# Patient Record
Sex: Male | Born: 1965 | Race: Black or African American | Hispanic: No | Marital: Single | Smoking: Never smoker
Health system: Southern US, Community
[De-identification: ages and names within clinical notes are randomized; demographics above are authoritative.]

## PROBLEM LIST (undated history)

## (undated) DIAGNOSIS — G43909 Migraine, unspecified, not intractable, without status migrainosus: Secondary | ICD-10-CM

## (undated) DIAGNOSIS — I10 Essential (primary) hypertension: Secondary | ICD-10-CM

## (undated) DIAGNOSIS — H409 Unspecified glaucoma: Secondary | ICD-10-CM

## (undated) DIAGNOSIS — F32A Depression, unspecified: Secondary | ICD-10-CM

## (undated) DIAGNOSIS — K219 Gastro-esophageal reflux disease without esophagitis: Secondary | ICD-10-CM

## (undated) DIAGNOSIS — E7800 Pure hypercholesterolemia, unspecified: Secondary | ICD-10-CM

## (undated) DIAGNOSIS — R519 Headache, unspecified: Secondary | ICD-10-CM

## (undated) DIAGNOSIS — F329 Major depressive disorder, single episode, unspecified: Secondary | ICD-10-CM

## (undated) DIAGNOSIS — M1990 Unspecified osteoarthritis, unspecified site: Secondary | ICD-10-CM

## (undated) DIAGNOSIS — D649 Anemia, unspecified: Secondary | ICD-10-CM

## (undated) DIAGNOSIS — H269 Unspecified cataract: Secondary | ICD-10-CM

## (undated) DIAGNOSIS — R51 Headache: Secondary | ICD-10-CM

## (undated) DIAGNOSIS — T7840XA Allergy, unspecified, initial encounter: Secondary | ICD-10-CM

## (undated) DIAGNOSIS — G4730 Sleep apnea, unspecified: Secondary | ICD-10-CM

## (undated) HISTORY — DX: Gastro-esophageal reflux disease without esophagitis: K21.9

## (undated) HISTORY — PX: COLONOSCOPY: SHX174

## (undated) HISTORY — DX: Migraine, unspecified, not intractable, without status migrainosus: G43.909

## (undated) HISTORY — DX: Unspecified glaucoma: H40.9

## (undated) HISTORY — PX: OTHER SURGICAL HISTORY: SHX169

## (undated) HISTORY — DX: Headache: R51

## (undated) HISTORY — DX: Unspecified cataract: H26.9

## (undated) HISTORY — DX: Sleep apnea, unspecified: G47.30

## (undated) HISTORY — DX: Unspecified osteoarthritis, unspecified site: M19.90

## (undated) HISTORY — PX: SHOULDER SURGERY: SHX246

## (undated) HISTORY — DX: Headache, unspecified: R51.9

## (undated) HISTORY — DX: Depression, unspecified: F32.A

## (undated) HISTORY — DX: Allergy, unspecified, initial encounter: T78.40XA

## (undated) HISTORY — DX: Anemia, unspecified: D64.9

## (undated) HISTORY — DX: Major depressive disorder, single episode, unspecified: F32.9

---

## 2007-07-14 ENCOUNTER — Emergency Department (HOSPITAL_COMMUNITY): Admission: EM | Admit: 2007-07-14 | Discharge: 2007-07-14 | Payer: Self-pay | Admitting: Emergency Medicine

## 2010-07-19 ENCOUNTER — Emergency Department (HOSPITAL_COMMUNITY)
Admission: EM | Admit: 2010-07-19 | Discharge: 2010-07-20 | Disposition: A | Discharge status: Home / Self Care | Payer: 59 | Attending: Emergency Medicine | Admitting: Emergency Medicine

## 2010-07-19 DIAGNOSIS — E119 Type 2 diabetes mellitus without complications: Secondary | ICD-10-CM | POA: Insufficient documentation

## 2010-07-19 DIAGNOSIS — I10 Essential (primary) hypertension: Secondary | ICD-10-CM | POA: Insufficient documentation

## 2010-07-19 DIAGNOSIS — L03119 Cellulitis of unspecified part of limb: Secondary | ICD-10-CM | POA: Insufficient documentation

## 2010-07-19 DIAGNOSIS — L02419 Cutaneous abscess of limb, unspecified: Secondary | ICD-10-CM | POA: Insufficient documentation

## 2010-07-19 DIAGNOSIS — Z79899 Other long term (current) drug therapy: Secondary | ICD-10-CM | POA: Insufficient documentation

## 2010-08-02 ENCOUNTER — Encounter: Payer: Self-pay | Admitting: Podiatry

## 2011-06-25 DIAGNOSIS — J309 Allergic rhinitis, unspecified: Secondary | ICD-10-CM | POA: Insufficient documentation

## 2011-06-25 DIAGNOSIS — R5383 Other fatigue: Secondary | ICD-10-CM | POA: Insufficient documentation

## 2011-06-25 DIAGNOSIS — G4710 Hypersomnia, unspecified: Secondary | ICD-10-CM | POA: Insufficient documentation

## 2011-07-28 ENCOUNTER — Emergency Department (HOSPITAL_COMMUNITY)
Admission: EM | Admit: 2011-07-28 | Discharge: 2011-07-28 | Disposition: A | Discharge status: Home / Self Care | Payer: 59 | Attending: Emergency Medicine | Admitting: Emergency Medicine

## 2011-07-28 ENCOUNTER — Encounter (HOSPITAL_COMMUNITY): Payer: Self-pay | Admitting: Emergency Medicine

## 2011-07-28 DIAGNOSIS — E7800 Pure hypercholesterolemia, unspecified: Secondary | ICD-10-CM | POA: Insufficient documentation

## 2011-07-28 DIAGNOSIS — Z79899 Other long term (current) drug therapy: Secondary | ICD-10-CM | POA: Insufficient documentation

## 2011-07-28 DIAGNOSIS — E119 Type 2 diabetes mellitus without complications: Secondary | ICD-10-CM | POA: Insufficient documentation

## 2011-07-28 DIAGNOSIS — H6690 Otitis media, unspecified, unspecified ear: Secondary | ICD-10-CM | POA: Insufficient documentation

## 2011-07-28 DIAGNOSIS — I10 Essential (primary) hypertension: Secondary | ICD-10-CM | POA: Insufficient documentation

## 2011-07-28 HISTORY — DX: Pure hypercholesterolemia, unspecified: E78.00

## 2011-07-28 HISTORY — DX: Essential (primary) hypertension: I10

## 2011-07-28 MED ORDER — AMOXICILLIN 500 MG PO CAPS: 500.0000 mg | ORAL_CAPSULE | Freq: Three times a day (TID) | ORAL | Status: AC

## 2011-07-28 MED ORDER — ANTIPYRINE-BENZOCAINE 5.4-1.4 % OT SOLN: 3.0000 [drp] | Freq: Four times a day (QID) | OTIC | Status: AC | PRN

## 2011-07-28 NOTE — Discharge Instructions (Signed)
Otitis Media, Adult  A middle ear infection is an infection in the space behind the eardrum. The medical name for this is "otitis media." It may happen after a common cold. It is caused by a germ that starts growing in that space. You may feel swollen glands in your neck on the side of the ear infection.  HOME CARE INSTRUCTIONS   · Take your medicine as directed until it is gone, even if you feel better after the first few days.  · Only take over-the-counter or prescription medicines for pain, discomfort, or fever as directed by your caregiver.  · Occasional use of a nasal decongestant a couple times per day may help with discomfort and help the eustachian tube to drain better.  Follow up with your caregiver in 10 to 14 days or as directed, to be certain that the infection has cleared. Not keeping the appointment could result in a chronic or permanent injury, pain, hearing loss and disability. If there is any problem keeping the appointment, you must call back to this facility for assistance.  SEEK IMMEDIATE MEDICAL CARE IF:   · You are not getting better in 2 to 3 days.  · You have pain that is not controlled with medication.  · You feel worse instead of better.  · You cannot use the medication as directed.  · You develop swelling, redness or pain around the ear or stiffness in your neck.  MAKE SURE YOU:   · Understand these instructions.  · Will watch your condition.  · Will get help right away if you are not doing well or get worse.  Document Released: 10/28/2003 Document Revised: 01/11/2011 Document Reviewed: 08/29/2007  [employer]® Patient Information ©2012 [employer].

## 2011-07-28 NOTE — ED Provider Notes (Signed)
History     CSN: [id number]  Arrival date & time 07/28/11  1419   3:05 PM HPI Patient reports left ear pain for one week. Reports pain has been worsening. Denies recent swimming, ear drainage, fever, sore throat. Reports mild decreased hearing.  Patient is a 46 y.o. male presenting with ear pain. The history is provided by the patient.  Otalgia This is a new problem. Episode onset: 1 week ago. The problem occurs constantly. The problem has been gradually worsening. There has been no fever. The pain is moderate. Pertinent negatives include no ear discharge, no headaches, no rhinorrhea, no sore throat, no vomiting and no cough. Hearing loss: decreased hearing.    Past Medical History  Diagnosis Date  . Hypertension   . Diabetes mellitus   . Hypercholesteremia     History reviewed. No pertinent past surgical history.  History reviewed. No pertinent family history.  History  Substance Use Topics  . Smoking status: Never Smoker   . Smokeless tobacco: Not on file  . Alcohol Use: Yes     occasional      Review of Systems  Constitutional: Negative for fever and chills.  HENT: Positive for ear pain. Negative for congestion, sore throat, rhinorrhea, trouble swallowing, dental problem, sinus pressure, tinnitus and ear discharge. Hearing loss: decreased hearing.   Eyes: Negative for pain.  Respiratory: Negative for cough.   Gastrointestinal: Negative for vomiting.  Neurological: Negative for headaches.  All other systems reviewed and are negative.    Allergies  Review of patient's allergies indicates no known allergies.  Home Medications   Current Outpatient Rx  Name Route Sig Dispense Refill  . ASPIRIN 81 MG PO TABS Oral Take 81 mg by mouth daily.    [name] GABAPENTIN 300 MG PO CAPS Oral Take 300 mg by mouth as needed. Migraine    . LISINOPRIL-HYDROCHLOROTHIAZIDE 20-25 MG PO TABS Oral Take 1 tablet by mouth daily.    [name] METFORMIN HCL 1000 MG PO TABS Oral Take 1,000 mg by mouth  2 (two) times daily with a meal.    . RAMIPRIL 1.25 MG PO CAPS Oral Take 1.25 mg by mouth daily.      BP 130/66  Pulse 84  Temp 98.2 F (36.8 C) (Oral)  Resp 16  Ht 5\' 8"  (1.727 m)  Wt 231 lb (104.781 kg)  BMI 35.12 kg/m2  SpO2 99%  Physical Exam  Constitutional: He is oriented to person, place, and time. He appears well-developed and well-nourished.  HENT:  Head: Normocephalic and atraumatic.  Right Ear: Hearing, tympanic membrane, external ear and ear canal normal.  Left Ear: No drainage or swelling. Tympanic membrane is bulging. Tympanic membrane is not injected, not scarred and not perforated. A middle ear effusion is present.  Nose: Nose normal.  Mouth/Throat: Oropharynx is clear and moist. No oropharyngeal exudate.  Eyes: Pupils are equal, round, and reactive to light.  Lymphadenopathy:    He has no cervical adenopathy.  Neurological: He is alert and oriented to person, place, and time.  Skin: Skin is warm and dry. No rash noted. No erythema. No pallor.  Psychiatric: He has a normal mood and affect. His behavior is normal.    ED Course  Procedures   MDM   Patient treated for Otitis media with amoxicillin and auralgan. Patient voices understanding and is ready for d/c     Thomasene Lot, PA-C 07/28/11 1553

## 2011-07-28 NOTE — ED Notes (Signed)
Patient states that his ears started hurting last Sunday and he cleaned them out. Now only his left ear hurst with pain down into his neck

## 2011-07-29 NOTE — ED Provider Notes (Signed)
Medical screening examination/treatment/procedure(s) were performed by non-physician practitioner and as supervising physician I was immediately available for consultation/collaboration.  Flint Melter, MD 07/29/11 [phone]

## 2011-11-12 ENCOUNTER — Encounter (HOSPITAL_COMMUNITY)
Admission: RE | Disposition: A | Discharge status: Home / Self Care | Payer: Self-pay | Source: Ambulatory Visit | Attending: Cardiology

## 2011-11-12 ENCOUNTER — Ambulatory Visit (HOSPITAL_COMMUNITY)
Admission: RE | Admit: 2011-11-12 | Discharge: 2011-11-12 | Disposition: A | Discharge status: Home / Self Care | Payer: 59 | Source: Ambulatory Visit | Attending: Cardiology | Admitting: Cardiology

## 2011-11-12 DIAGNOSIS — E785 Hyperlipidemia, unspecified: Secondary | ICD-10-CM | POA: Insufficient documentation

## 2011-11-12 DIAGNOSIS — I10 Essential (primary) hypertension: Secondary | ICD-10-CM | POA: Insufficient documentation

## 2011-11-12 DIAGNOSIS — E119 Type 2 diabetes mellitus without complications: Secondary | ICD-10-CM | POA: Insufficient documentation

## 2011-11-12 DIAGNOSIS — R079 Chest pain, unspecified: Secondary | ICD-10-CM | POA: Insufficient documentation

## 2011-11-12 HISTORY — PX: LEFT HEART CATHETERIZATION WITH CORONARY ANGIOGRAM: SHX5451

## 2011-11-12 LAB — GLUCOSE, CAPILLARY: Glucose-Capillary: 138 mg/dL — ABNORMAL HIGH (ref 70–99)

## 2011-11-12 SURGERY — LEFT HEART CATHETERIZATION WITH CORONARY ANGIOGRAM
Anesthesia: LOCAL

## 2011-11-12 MED ORDER — HEPARIN SODIUM (PORCINE) 1000 UNIT/ML IJ SOLN
INTRAMUSCULAR | Status: AC
  Filled 2011-11-12: qty 1

## 2011-11-12 MED ORDER — SODIUM CHLORIDE 0.9 % IJ SOLN: 3.0000 mL | Freq: Two times a day (BID) | INTRAMUSCULAR | Status: DC

## 2011-11-12 MED ORDER — ONDANSETRON HCL 4 MG/2ML IJ SOLN: 4.0000 mg | Freq: Four times a day (QID) | INTRAMUSCULAR | Status: DC | PRN

## 2011-11-12 MED ORDER — SODIUM CHLORIDE 0.9 % IV SOLN
INTRAVENOUS | Status: DC
  Administered 2011-11-12: 1000 mL via INTRAVENOUS

## 2011-11-12 MED ORDER — METFORMIN HCL 1000 MG PO TABS: 1000.0000 mg | ORAL_TABLET | Freq: Two times a day (BID) | ORAL | Status: DC

## 2011-11-12 MED ORDER — VERAPAMIL HCL 2.5 MG/ML IV SOLN
INTRAVENOUS | Status: AC
  Filled 2011-11-12: qty 2

## 2011-11-12 MED ORDER — NITROGLYCERIN 0.2 MG/ML ON CALL CATH LAB
INTRAVENOUS | Status: AC
  Filled 2011-11-12: qty 1

## 2011-11-12 MED ORDER — ASPIRIN 81 MG PO CHEW
324.0000 mg | CHEWABLE_TABLET | ORAL | Status: AC
  Administered 2011-11-12: 324 mg via ORAL

## 2011-11-12 MED ORDER — HYDROMORPHONE HCL PF 2 MG/ML IJ SOLN
INTRAMUSCULAR | Status: AC
  Filled 2011-11-12: qty 1

## 2011-11-12 MED ORDER — SODIUM CHLORIDE 0.9 % IJ SOLN
3.0000 mL | INTRAMUSCULAR | Status: DC | PRN
  Administered 2011-11-12: 3 mL via INTRAVENOUS

## 2011-11-12 MED ORDER — LIDOCAINE HCL (PF) 1 % IJ SOLN
INTRAMUSCULAR | Status: AC
  Filled 2011-11-12: qty 30

## 2011-11-12 MED ORDER — SODIUM CHLORIDE 0.9 % IV SOLN: 1.0000 mL/kg/h | INTRAVENOUS | Status: DC

## 2011-11-12 MED ORDER — ASPIRIN 81 MG PO CHEW
CHEWABLE_TABLET | ORAL | Status: AC
  Filled 2011-11-12: qty 4

## 2011-11-12 MED ORDER — HEPARIN (PORCINE) IN NACL 2-0.9 UNIT/ML-% IJ SOLN
INTRAMUSCULAR | Status: AC
  Filled 2011-11-12: qty 1000

## 2011-11-12 MED ORDER — SODIUM CHLORIDE 0.9 % IV SOLN: 250.0000 mL | INTRAVENOUS | Status: DC | PRN

## 2011-11-12 MED ORDER — MIDAZOLAM HCL 2 MG/2ML IJ SOLN
INTRAMUSCULAR | Status: AC
  Filled 2011-11-12: qty 2

## 2011-11-12 NOTE — H&P (Signed)
  Please see paper chart  

## 2011-11-12 NOTE — Interval H&P Note (Signed)
History and Physical Interval Note:  11/12/2011 7:46 AM  Jason Ortega  has presented today for surgery, with the diagnosis of cad  The various methods of treatment have been discussed with the patient and family. After consideration of risks, benefits and other options for treatment, the patient has consented to  Procedure(s) (LRB) with comments: LEFT HEART CATHETERIZATION WITH CORONARY ANGIOGRAM (N/A) and possible PTCA  as a surgical intervention .  The patient's history has been reviewed, patient examined, no change in status, stable for surgery.  I have reviewed the patient's chart and labs.  Questions were answered to the patient's satisfaction.     Pamella Pert

## 2011-11-12 NOTE — CV Procedure (Signed)
Procedure performed:  Left heart catheterization including hemodynamic monitoring of the left ventricle, left ventriculogram, selective right and left coronary arteriography.  Indication patient is a 46 year-old man with history of hypertension,  hyperlipidemia,  Diabetes Mellitus   who presents with chest [pain. Patient has  had non invasive testing which was abnormal revealing mild antral wall ischemia.[name]  Hence is brought to the cardiac catheterization lab to evaluate her coronary anatomy for definitive diagnosis of CAD.  Hemodynamic data:  Left ventricular pressure was 113/7 with LVEDP of 17 mm mercury. Aortic pressure was 106/72 with a mean of 88 mm mercury. There was no pressure gradient across the aortic valve  Left ventricle: Performed in the RAO projection revealed LVEF of 60%. There was no MR. no Wall motion abnormality.  Right coronary artery: The vessel is smooth, normal, Co- Dominant.  Left main coronary artery is large and normal.  Circumflex coronary artery: A large vessel giving origin to a moderate sized obtuse marginal 1.   LAD:  LAD gives origin to a very large diagonal-1. LAD Is smooth and normal.  Technique: Under sterile precautions using a 6 French right radial  arterial access, a 6 French sheath was introduced into the right radial artery. A 5 [place] Tig 4 catheter was advanced into the ascending aorta selective  right coronary artery and left coronary artery was cannulated and angiography was performed in multiple views. The catheter was pulled back Out of the body over Versacore wire.  Same Catheter was used to perform LV gram which was performed in LAO projection.  Catheter exchanged out of the body over Versacore-Wire. NO immediate complications noted. Patient tolerated the procedure well.   Rec: Medical therapy with aggressive risk factor reduction.   Disposition: Will be discharged home today with outpatient follow up. 60 cc of contrast was utilized for  diagnostic angiography.

## 2011-12-07 HISTORY — PX: CARDIAC CATHETERIZATION: SHX172

## 2012-01-29 ENCOUNTER — Encounter (HOSPITAL_COMMUNITY): Payer: Self-pay | Admitting: Emergency Medicine

## 2012-01-29 ENCOUNTER — Emergency Department (HOSPITAL_COMMUNITY)
Admission: EM | Admit: 2012-01-29 | Discharge: 2012-01-30 | Disposition: A | Discharge status: Home / Self Care | Payer: 59 | Attending: Emergency Medicine | Admitting: Emergency Medicine

## 2012-01-29 ENCOUNTER — Emergency Department (HOSPITAL_COMMUNITY): Payer: 59

## 2012-01-29 DIAGNOSIS — IMO0001 Reserved for inherently not codable concepts without codable children: Secondary | ICD-10-CM | POA: Insufficient documentation

## 2012-01-29 DIAGNOSIS — Z79899 Other long term (current) drug therapy: Secondary | ICD-10-CM | POA: Insufficient documentation

## 2012-01-29 DIAGNOSIS — R51 Headache: Secondary | ICD-10-CM | POA: Insufficient documentation

## 2012-01-29 DIAGNOSIS — J069 Acute upper respiratory infection, unspecified: Secondary | ICD-10-CM | POA: Insufficient documentation

## 2012-01-29 DIAGNOSIS — E119 Type 2 diabetes mellitus without complications: Secondary | ICD-10-CM | POA: Insufficient documentation

## 2012-01-29 DIAGNOSIS — R509 Fever, unspecified: Secondary | ICD-10-CM

## 2012-01-29 DIAGNOSIS — E7800 Pure hypercholesterolemia, unspecified: Secondary | ICD-10-CM | POA: Insufficient documentation

## 2012-01-29 DIAGNOSIS — I10 Essential (primary) hypertension: Secondary | ICD-10-CM | POA: Insufficient documentation

## 2012-01-29 MED ORDER — SODIUM CHLORIDE 0.9 % IV BOLUS (SEPSIS)
1000.0000 mL | Freq: Once | INTRAVENOUS | Status: AC
  Administered 2012-01-30: 1000 mL via INTRAVENOUS

## 2012-01-29 MED ORDER — ACETAMINOPHEN 325 MG PO TABS
650.0000 mg | ORAL_TABLET | Freq: Once | ORAL | Status: AC
  Administered 2012-01-29: 650 mg via ORAL
  Filled 2012-01-29: qty 2

## 2012-01-29 NOTE — ED Notes (Signed)
Patient states he began running a fever this morning. Patient reports temp 102.0 orally. Patient states his daughter has been sick recently running fever, with emesis. Patient denies N/V/D, difficulty breathing. Headache 6/10, patient states he took Gabapentin about 1700 for the headache. Coughing up yellow mucous intermittently, with a dry cough.

## 2012-01-29 NOTE — ED Notes (Signed)
Patient reports fever that started earlier this morning. Reports dry cough, with occasional yellow mucous production. Patient daughter and wife recently sick with stomach virus.

## 2012-01-30 LAB — CBC WITH DIFFERENTIAL/PLATELET
HCT: 34.4 % — ABNORMAL LOW (ref 39.0–52.0)
Hemoglobin: 11.2 g/dL — ABNORMAL LOW (ref 13.0–17.0)
Lymphocytes Relative: 16 % (ref 12–46)
Lymphs Abs: 0.9 10*3/uL (ref 0.7–4.0)
Monocytes Absolute: 0.5 10*3/uL (ref 0.1–1.0)
Monocytes Relative: 10 % (ref 3–12)
Neutro Abs: 3.9 10*3/uL (ref 1.7–7.7)
Neutrophils Relative %: 72 % (ref 43–77)
RBC: 4.34 MIL/uL (ref 4.22–5.81)
WBC: 5.3 10*3/uL (ref 4.0–10.5)

## 2012-01-30 LAB — BASIC METABOLIC PANEL
BUN: 13 mg/dL (ref 6–23)
CO2: 26 mEq/L (ref 19–32)
Chloride: 100 mEq/L (ref 96–112)
Creatinine, Ser: 0.99 mg/dL (ref 0.50–1.35)
Potassium: 3.5 mEq/L (ref 3.5–5.1)

## 2012-01-30 MED ORDER — ALBUTEROL SULFATE (5 MG/ML) 0.5% IN NEBU
5.0000 mg | INHALATION_SOLUTION | Freq: Once | RESPIRATORY_TRACT | Status: AC
  Administered 2012-01-30: 5 mg via RESPIRATORY_TRACT
  Filled 2012-01-30: qty 1

## 2012-01-30 MED ORDER — IBUPROFEN 800 MG PO TABS
800.0000 mg | ORAL_TABLET | Freq: Once | ORAL | Status: AC
  Administered 2012-01-30: 800 mg via ORAL
  Filled 2012-01-30: qty 1

## 2012-01-30 MED ORDER — SODIUM CHLORIDE 0.9 % IV BOLUS (SEPSIS)
1000.0000 mL | Freq: Once | INTRAVENOUS | Status: AC
  Administered 2012-01-30: 1000 mL via INTRAVENOUS

## 2012-01-30 MED ORDER — OSELTAMIVIR PHOSPHATE 75 MG PO CAPS: 75.0000 mg | ORAL_CAPSULE | Freq: Two times a day (BID) | ORAL | Status: DC

## 2012-01-30 MED ORDER — IPRATROPIUM BROMIDE 0.02 % IN SOLN
0.5000 mg | Freq: Once | RESPIRATORY_TRACT | Status: AC
  Administered 2012-01-30: 0.5 mg via RESPIRATORY_TRACT
  Filled 2012-01-30: qty 2.5

## 2012-01-30 MED ORDER — LEVOFLOXACIN 500 MG PO TABS: 500.0000 mg | ORAL_TABLET | Freq: Every day | ORAL | Status: DC

## 2012-01-30 NOTE — ED Notes (Signed)
Discharge instructions reviewed. Rx given x2.  

## 2012-01-30 NOTE — ED Provider Notes (Signed)
History     CSN: [id number]  Arrival date & time 01/29/12  2254   First MD Initiated Contact with Patient 01/29/12 2342      Chief Complaint  Patient presents with  . Fever   HPI  History provided by the patient. Patient is a 46 year old male with history of hypertension, diabetes and hypercholesterolemia who presents with complaints of duct of cough and fever. Symptoms began earlier today and have been persistent throughout the day. Patient has used over-the-counter Alka-Seltzer cough and cold without significant improvement of symptoms. Patient reports having her daughter with similar cough and fever symptoms recently. Wife has also been sick with vomiting and diarrhea symptoms. Symptoms have been associated with a productive cough of yellow mucus. Patient also reports some body aches and headache. Denies any sore throat. Denies any nausea vomiting or diarrhea. Denies any shortness of breath.    Past Medical History  Diagnosis Date  . Hypertension   . Diabetes mellitus   . Hypercholesteremia     History reviewed. No pertinent past surgical history.  History reviewed. No pertinent family history.  History  Substance Use Topics  . Smoking status: Never Smoker   . Smokeless tobacco: Not on file  . Alcohol Use: Yes     Comment: occasional      Review of Systems  Constitutional: Positive for fever and chills. Negative for appetite change.  HENT: Negative for congestion, sore throat, rhinorrhea, neck pain and neck stiffness.   Respiratory: Positive for cough. Negative for shortness of breath.   Cardiovascular: Negative for chest pain.  Gastrointestinal: Negative for nausea, vomiting and diarrhea.  Neurological: Positive for headaches.  All other systems reviewed and are negative.    Allergies  Review of patient's allergies indicates no known allergies.  Home Medications   Current Outpatient Rx  Name  Route  Sig  Dispense  Refill  . AMLODIPINE BESYLATE-VALSARTAN  5-160 MG PO TABS   Oral   Take 1 tablet by mouth daily.         . ASPIRIN 81 MG PO TABS   Oral   Take 81 mg by mouth daily.         . ATORVASTATIN CALCIUM 20 MG PO TABS   Oral   Take 20 mg by mouth daily.         [name] CARVEDILOL 6.25 MG PO TABS   Oral   Take 6.25 mg by mouth 2 (two) times daily with a meal.         . GABAPENTIN 300 MG PO CAPS   Oral   Take 300 mg by mouth daily as needed. For migraines         . HYDROCHLOROTHIAZIDE 12.5 MG PO TABS   Oral   Take 12.5 mg by mouth daily.         [name] METFORMIN HCL 1000 MG PO TABS   Oral   Take 1 tablet (1,000 mg total) by mouth 2 (two) times daily with a meal.         . VERAPAMIL HCL 120 MG PO TABS   Oral   Take 120 mg by mouth daily.           BP 132/73  Pulse 130  Temp 101.7 F (38.7 C) (Oral)  Resp 16  Ht 5\' 8"  (1.727 m)  Wt 235 lb (106.595 kg)  BMI 35.73 kg/m2  SpO2 95%  Physical Exam  Nursing note and vitals reviewed. Constitutional: He is oriented to person, place, and  time. He appears well-developed and well-nourished. No distress.  HENT:  Head: Normocephalic.  Mouth/Throat: Oropharynx is clear and moist.  Neck: Normal range of motion. Neck supple.       No meningeal signs  Cardiovascular: Regular rhythm.  Tachycardia present.   Pulmonary/Chest: Effort normal and breath sounds normal. No respiratory distress. He has no rales.       Frequent coughing  Abdominal: Soft. There is no tenderness. There is no rebound.  Musculoskeletal: He exhibits no edema and no tenderness.  Neurological: He is alert and oriented to person, place, and time.  Skin: Skin is warm. No rash noted. No erythema.  Psychiatric: He has a normal mood and affect. His behavior is normal.    ED Course  Procedures    Results for orders placed during the hospital encounter of 01/29/12  CBC WITH DIFFERENTIAL      Component Value Range   WBC 5.3  4.0 - 10.5 K/uL   RBC 4.34  4.22 - 5.81 MIL/uL   Hemoglobin 11.2 (*) 13.0 -  17.0 g/dL   HCT 16.1 (*) 09.6 - 04.5 %   MCV 79.3  78.0 - 100.0 fL   MCH 25.8 (*) 26.0 - 34.0 pg   MCHC 32.6  30.0 - 36.0 g/dL   RDW 40.9  81.1 - 91.4 %   Platelets 230  150 - 400 K/uL   Neutrophils Relative 72  43 - 77 %   Neutro Abs 3.9  1.7 - 7.7 K/uL   Lymphocytes Relative 16  12 - 46 %   Lymphs Abs 0.9  0.7 - 4.0 K/uL   Monocytes Relative 10  3 - 12 %   Monocytes Absolute 0.5  0.1 - 1.0 K/uL   Eosinophils Relative 1  0 - 5 %   Eosinophils Absolute 0.1  0.0 - 0.7 K/uL   Basophils Relative 0  0 - 1 %   Basophils Absolute 0.0  0.0 - 0.1 K/uL  BASIC METABOLIC PANEL      Component Value Range   Sodium 136  135 - 145 mEq/L   Potassium 3.5  3.5 - 5.1 mEq/L   Chloride 100  96 - 112 mEq/L   CO2 26  19 - 32 mEq/L   Glucose, Bld 230 (*) 70 - 99 mg/dL   BUN 13  6 - 23 mg/dL   Creatinine, Ser 7.82  0.50 - 1.35 mg/dL   Calcium 8.3 (*) 8.4 - 10.5 mg/dL   GFR calc non Af Amer >90  >90 mL/min   GFR calc Af Amer >90  >90 mL/min      Dg Chest 2 View  01/30/2012  *RADIOLOGY REPORT*  Clinical Data: Cough and fever for 24 hours, history hypertension, diabetes  CHEST - 2 VIEW  Comparison: None  Findings: Normal heart size, mediastinal contours, and pulmonary vascularity. Peribronchial thickening without infiltrate, pleural effusion or pneumothorax. Subsegmental atelectasis right base. Bones unremarkable.  IMPRESSION: Bronchitic changes with minimal subsegmental atelectasis at right base.   Original Report Authenticated By: Ulyses Southward, M.D.      1. Fever   2. URI (upper respiratory infection)       MDM  11:50PM patient seen and evaluated. Patient appears comfortable resting in the bed. Patient with tachycardia but no other signs of acute distress.  Patient having continued improvement of symptoms. He has been comfortable and sleeping at times. Patient does have occasional drops in O2 while sleeping but has history of this sleep apnea normally on CPAP.  While awake oxygen maintains around  95-96%.  Heart rate has improved significantly. Patient does maintain mild tachycardia. He has received several fluid boluses. At this time suspect possible viral process. He will cover for possible pneumonia given history of diabetes. Prescriptions for Levaquin and Tamiflu given. Strict return precautions provided.     Date: 01/30/2012  Rate: 128  Rhythm: sinus tachycardia  QRS Axis: normal  Intervals: normal  ST/T Wave abnormalities: nonspecific T wave changes  Conduction Disutrbances:none  Narrative Interpretation:   Old EKG Reviewed: changes noted tachycardia new    Angus Seller, [place] 01/30/12 2024

## 2012-01-30 NOTE — ED Notes (Signed)
Pt. Ambulated down the hall and back to his room without difficulty. Pt. Has steady gait while ambulating down the hall. Pt. Ambulated down the hall without assistance. Pt. States that he is not dizzy and that he feels fine except for his cold.

## 2012-02-05 NOTE — ED Provider Notes (Signed)
Medical screening examination/treatment/procedure(s) were performed by non-physician practitioner and as supervising physician I was immediately available for consultation/collaboration. Devoria Albe, MD, Armando Gang   Ward Givens, MD 02/05/12 [phone]

## 2012-02-08 ENCOUNTER — Emergency Department (HOSPITAL_COMMUNITY)
Admission: EM | Admit: 2012-02-08 | Discharge: 2012-02-09 | Disposition: A | Discharge status: Home / Self Care | Payer: 59 | Attending: Emergency Medicine | Admitting: Emergency Medicine

## 2012-02-08 ENCOUNTER — Emergency Department (HOSPITAL_COMMUNITY): Payer: 59

## 2012-02-08 ENCOUNTER — Encounter (HOSPITAL_COMMUNITY): Payer: Self-pay

## 2012-02-08 DIAGNOSIS — R05 Cough: Secondary | ICD-10-CM | POA: Insufficient documentation

## 2012-02-08 DIAGNOSIS — Z7982 Long term (current) use of aspirin: Secondary | ICD-10-CM | POA: Insufficient documentation

## 2012-02-08 DIAGNOSIS — R509 Fever, unspecified: Secondary | ICD-10-CM | POA: Insufficient documentation

## 2012-02-08 DIAGNOSIS — J111 Influenza due to unidentified influenza virus with other respiratory manifestations: Secondary | ICD-10-CM

## 2012-02-08 DIAGNOSIS — I10 Essential (primary) hypertension: Secondary | ICD-10-CM | POA: Insufficient documentation

## 2012-02-08 DIAGNOSIS — E119 Type 2 diabetes mellitus without complications: Secondary | ICD-10-CM | POA: Insufficient documentation

## 2012-02-08 DIAGNOSIS — R059 Cough, unspecified: Secondary | ICD-10-CM | POA: Insufficient documentation

## 2012-02-08 DIAGNOSIS — E7800 Pure hypercholesterolemia, unspecified: Secondary | ICD-10-CM | POA: Insufficient documentation

## 2012-02-08 DIAGNOSIS — R52 Pain, unspecified: Secondary | ICD-10-CM | POA: Insufficient documentation

## 2012-02-08 DIAGNOSIS — Z79899 Other long term (current) drug therapy: Secondary | ICD-10-CM | POA: Insufficient documentation

## 2012-02-08 DIAGNOSIS — R51 Headache: Secondary | ICD-10-CM | POA: Insufficient documentation

## 2012-02-08 LAB — URINALYSIS, ROUTINE W REFLEX MICROSCOPIC
Bilirubin Urine: NEGATIVE
Glucose, UA: >1000 mg/dL — AB
Hgb urine dipstick: NEGATIVE
Ketones, ur: 15 mg/dL — AB
Leukocytes, UA: NEGATIVE
Nitrite: NEGATIVE
Protein, ur: NEGATIVE mg/dL
Specific Gravity, Urine: 1.025 (ref 1.005–1.030)
Urobilinogen, UA: 0.2 mg/dL (ref 0.0–1.0)
pH: 5.5 (ref 5.0–8.0)

## 2012-02-08 LAB — CBC WITH DIFFERENTIAL/PLATELET
Basophils Absolute: 0 10*3/uL (ref 0.0–0.1)
Basophils Relative: 0 % (ref 0–1)
Eosinophils Absolute: 0 10*3/uL (ref 0.0–0.7)
Eosinophils Relative: 1 % (ref 0–5)
HCT: 39 % (ref 39.0–52.0)
Hemoglobin: 12.3 g/dL — ABNORMAL LOW (ref 13.0–17.0)
Lymphocytes Relative: 7 % — ABNORMAL LOW (ref 12–46)
Lymphs Abs: 0.4 10*3/uL — ABNORMAL LOW (ref 0.7–4.0)
MCH: 24.8 pg — ABNORMAL LOW (ref 26.0–34.0)
MCHC: 31.5 g/dL (ref 30.0–36.0)
MCV: 78.6 fL (ref 78.0–100.0)
Monocytes Absolute: 0.2 10*3/uL (ref 0.1–1.0)
Monocytes Relative: 3 % (ref 3–12)
Neutro Abs: 5.5 10*3/uL (ref 1.7–7.7)
Neutrophils Relative %: 90 % — ABNORMAL HIGH (ref 43–77)
Platelets: 297 10*3/uL (ref 150–400)
RBC: 4.96 MIL/uL (ref 4.22–5.81)
RDW: 13.9 % (ref 11.5–15.5)
WBC: 6.2 10*3/uL (ref 4.0–10.5)

## 2012-02-08 LAB — BASIC METABOLIC PANEL
BUN: 14 mg/dL (ref 6–23)
CO2: 24 mEq/L (ref 19–32)
Calcium: 8.7 mg/dL (ref 8.4–10.5)
Chloride: 97 mEq/L (ref 96–112)
Creatinine, Ser: 0.92 mg/dL (ref 0.50–1.35)
GFR calc Af Amer: >90 mL/min (ref >90)
GFR calc non Af Amer: >90 mL/min (ref >90)
Glucose, Bld: 301 mg/dL — ABNORMAL HIGH (ref 70–99)
Potassium: 3.8 mEq/L (ref 3.5–5.1)
Sodium: 134 mEq/L — ABNORMAL LOW (ref 135–145)

## 2012-02-08 LAB — URINE MICROSCOPIC-ADD ON

## 2012-02-08 MED ORDER — SODIUM CHLORIDE 0.9 % IV BOLUS (SEPSIS)
2000.0000 mL | Freq: Once | INTRAVENOUS | Status: AC
  Administered 2012-02-08: 2000 mL via INTRAVENOUS

## 2012-02-08 MED ORDER — KETOROLAC TROMETHAMINE 30 MG/ML IJ SOLN
30.0000 mg | Freq: Once | INTRAMUSCULAR | Status: AC
  Administered 2012-02-08: 30 mg via INTRAVENOUS
  Filled 2012-02-08: qty 1

## 2012-02-08 NOTE — ED Notes (Signed)
Patient c/o fever, chills & headache x 1 day. Tmax at home 100.8. Denies SOB, CP, cough, congestion, N/V or abd pain.

## 2012-02-09 MED ORDER — GUAIFENESIN ER 1200 MG PO TB12: 1.0000 | ORAL_TABLET | Freq: Two times a day (BID) | ORAL | Status: DC

## 2012-02-09 MED ORDER — IBUPROFEN 800 MG PO TABS: 800.0000 mg | ORAL_TABLET | Freq: Three times a day (TID) | ORAL | Status: DC | PRN

## 2012-02-09 MED ORDER — PROMETHAZINE-DM 6.25-15 MG/5ML PO SYRP: 5.0000 mL | ORAL_SOLUTION | Freq: Four times a day (QID) | ORAL | Status: DC | PRN

## 2012-02-09 NOTE — ED Provider Notes (Signed)
Medical screening examination/treatment/procedure(s) were performed by non-physician practitioner and as supervising physician I was immediately available for consultation/collaboration.    Ausha Sieh R Cheick Suhr, MD 02/09/12 1509 

## 2012-02-09 NOTE — ED Notes (Signed)
[name]. NAD. Vitals stable. Respirations even and regular. Denies pain. Denies SOB. Denies N/V. Verbalized understanding of medication admin, need to follow up with PCP, and need to seek immediate treatment. Ambulatory with no assistance. Denies any further questions.

## 2012-02-09 NOTE — ED Provider Notes (Signed)
History     CSN: [id number]  Arrival date & time 02/08/12  1918   First MD Initiated Contact with Patient 02/08/12 2002      Chief Complaint  Patient presents with  . Headache    (Consider location/radiation/quality/duration/timing/severity/associated sxs/prior treatment) HPI Patient presents emergency Department with headache, fever, cough, and body aches for the last 24 hours.  Patient states that he had a similar illness several weeks back.  The patient denies taking anything prior to arrival for his symptoms.  The patient denies chest pain, shortness of breath, weakness, dizziness, syncope, back pain, abdominal pain, or dysuria.  Patient states that he was given treatment with Tamiflu and Levaquin previously. Past Medical History  Diagnosis Date  . Hypertension   . Diabetes mellitus   . Hypercholesteremia     Past Surgical History  Procedure Date  . Cardiac catheterization 12/2011    History reviewed. No pertinent family history.  History  Substance Use Topics  . Smoking status: Never Smoker   . Smokeless tobacco: Not on file  . Alcohol Use: Yes     Comment: occasional      Review of Systems All other systems negative except as documented in the HPI. All pertinent positives and negatives as reviewed in the HPI.  Allergies  Review of patient's allergies indicates no known allergies.  Home Medications   Current Outpatient Rx  Name  Route  Sig  Dispense  Refill  . AMLODIPINE BESYLATE-VALSARTAN 5-160 MG PO TABS   Oral   Take 1 tablet by mouth daily.         . ASPIRIN 81 MG PO TABS   Oral   Take 81 mg by mouth daily.         . ATORVASTATIN CALCIUM 20 MG PO TABS   Oral   Take 20 mg by mouth daily.         [name] CARVEDILOL 6.25 MG PO TABS   Oral   Take 6.25 mg by mouth 2 (two) times daily with a meal.         . GABAPENTIN 300 MG PO CAPS   Oral   Take 300 mg by mouth daily as needed. For migraines         . HYDROCHLOROTHIAZIDE 12.5 MG PO TABS  Oral   Take 12.5 mg by mouth daily.         . IBUPROFEN 200 MG PO TABS   Oral   Take 400 mg by mouth every 6 (six) hours as needed. For pain         . METFORMIN HCL 1000 MG PO TABS   Oral   Take 1 tablet (1,000 mg total) by mouth 2 (two) times daily with a meal.         . VERAPAMIL HCL 120 MG PO TABS   Oral   Take 120 mg by mouth daily.           BP 140/84  Pulse 108  Temp 99.7 F (37.6 C) (Oral)  Resp 16  SpO2 100%  Physical Exam  Nursing note and vitals reviewed. Constitutional: He is oriented to person, place, and time. He appears well-developed and well-nourished. No distress.  HENT:  Head: Normocephalic and atraumatic.  Mouth/Throat: Oropharynx is clear and moist.  Eyes: Pupils are equal, round, and reactive to light.  Neck: Normal range of motion. Neck supple.  Cardiovascular: Normal rate, regular rhythm and normal heart sounds.  Exam reveals no gallop and no friction rub.  No murmur heard. Pulmonary/Chest: Effort normal and breath sounds normal. No respiratory distress. He has no wheezes. He has no rales.  Abdominal: Soft. Bowel sounds are normal. He exhibits no distension. There is no tenderness.  Neurological: He is alert and oriented to person, place, and time.  Skin: Skin is warm and dry. No rash noted.    ED Course  Procedures (including critical care time)  Labs Reviewed  CBC WITH DIFFERENTIAL - Abnormal; Notable for the following:    Hemoglobin 12.3 (*)     MCH 24.8 (*)     Neutrophils Relative 90 (*)     Lymphocytes Relative 7 (*)     Lymphs Abs 0.4 (*)     All other components within normal limits  BASIC METABOLIC PANEL - Abnormal; Notable for the following:    Sodium 134 (*)     Glucose, Bld 301 (*)     All other components within normal limits  URINALYSIS, ROUTINE W REFLEX MICROSCOPIC - Abnormal; Notable for the following:    Glucose, UA >1000 (*)     Ketones, ur 15 (*)     All other components within normal limits  URINE  MICROSCOPIC-ADD ON   Dg Chest 2 View  02/08/2012  *RADIOLOGY REPORT*  Clinical Data: Fever and cough  CHEST - 2 VIEW  Comparison: 01/29/2002  Findings: Lung volumes are low with crowding of the bronchovascular markings.  No new focal pulmonary opacity.  No pleural effusion. Heart size is normal.  No acute osseous finding.  IMPRESSION: Low volumes crowding of the bronchovascular markings but no acute cardiopulmonary process.   Original Report Authenticated By: Christiana Pellant, M.D.    Patient be treated for influenza-like illness.  Advised the patient that, since he's had a previous, round of treatment with antibiotics, and Tamiflu that I felt like this could be treated conservatively.  Patient is feeling considerably better following 2 L of IV fluids.  Patient states he would like to go home and advise him to increase his fluid intake and rest as much possible.  Tylenol and Motrin for fever.  Return here for any worsening in his condition     MDM  MDM Reviewed: nursing note, vitals and previous chart Reviewed previous: x-ray and labs Interpretation: labs and x-ray           Carlyle Dolly, PA-C 02/09/12 0041

## 2012-06-08 ENCOUNTER — Encounter (HOSPITAL_COMMUNITY): Payer: Self-pay | Admitting: *Deleted

## 2012-06-08 ENCOUNTER — Emergency Department (HOSPITAL_COMMUNITY)
Admission: EM | Admit: 2012-06-08 | Discharge: 2012-06-08 | Disposition: A | Discharge status: Home / Self Care | Payer: 59 | Attending: Emergency Medicine | Admitting: Emergency Medicine

## 2012-06-08 DIAGNOSIS — E7800 Pure hypercholesterolemia, unspecified: Secondary | ICD-10-CM | POA: Insufficient documentation

## 2012-06-08 DIAGNOSIS — E119 Type 2 diabetes mellitus without complications: Secondary | ICD-10-CM | POA: Insufficient documentation

## 2012-06-08 DIAGNOSIS — Z79899 Other long term (current) drug therapy: Secondary | ICD-10-CM | POA: Insufficient documentation

## 2012-06-08 DIAGNOSIS — I10 Essential (primary) hypertension: Secondary | ICD-10-CM | POA: Insufficient documentation

## 2012-06-08 DIAGNOSIS — G43909 Migraine, unspecified, not intractable, without status migrainosus: Secondary | ICD-10-CM

## 2012-06-08 DIAGNOSIS — Z9889 Other specified postprocedural states: Secondary | ICD-10-CM | POA: Insufficient documentation

## 2012-06-08 DIAGNOSIS — M542 Cervicalgia: Secondary | ICD-10-CM | POA: Insufficient documentation

## 2012-06-08 DIAGNOSIS — Z7982 Long term (current) use of aspirin: Secondary | ICD-10-CM | POA: Insufficient documentation

## 2012-06-08 DIAGNOSIS — H53149 Visual discomfort, unspecified: Secondary | ICD-10-CM | POA: Insufficient documentation

## 2012-06-08 LAB — POCT I-STAT, CHEM 8
BUN: 9 mg/dL (ref 6–23)
Calcium, Ion: 1.24 mmol/L — ABNORMAL HIGH (ref 1.12–1.23)
TCO2: 31 mmol/L (ref 0–100)

## 2012-06-08 MED ORDER — DIPHENHYDRAMINE HCL 50 MG/ML IJ SOLN
12.5000 mg | Freq: Once | INTRAMUSCULAR | Status: AC
  Administered 2012-06-08: 12.5 mg via INTRAVENOUS
  Filled 2012-06-08: qty 1

## 2012-06-08 MED ORDER — ACETAMINOPHEN 500 MG PO TABS
1000.0000 mg | ORAL_TABLET | Freq: Once | ORAL | Status: DC
  Administered 2012-06-08: 1000 mg via ORAL
  Filled 2012-06-08: qty 1

## 2012-06-08 MED ORDER — SODIUM CHLORIDE 0.9 % IV BOLUS (SEPSIS)
1000.0000 mL | Freq: Once | INTRAVENOUS | Status: AC
  Administered 2012-06-08: 1000 mL via INTRAVENOUS

## 2012-06-08 MED ORDER — KETOROLAC TROMETHAMINE 30 MG/ML IJ SOLN
30.0000 mg | Freq: Once | INTRAMUSCULAR | Status: AC
  Administered 2012-06-08: 30 mg via INTRAVENOUS
  Filled 2012-06-08: qty 1

## 2012-06-08 MED ORDER — PROCHLORPERAZINE EDISYLATE 5 MG/ML IJ SOLN
10.0000 mg | Freq: Once | INTRAMUSCULAR | Status: AC
  Administered 2012-06-08: 10 mg via INTRAVENOUS
  Filled 2012-06-08: qty 2

## 2012-06-08 NOTE — ED Notes (Signed)
CO of HA since yesterday. Prescribed neurontin 900mg  since HA started. Had some light sensitivity but denies any double vision.

## 2012-06-08 NOTE — ED Notes (Signed)
Pt states started having a migraine yesterday evening around 5 pm, states yesterday had sound sensitivity, not at this time though, denies n/v.

## 2012-06-08 NOTE — ED Provider Notes (Signed)
History     CSN: [id number]  Arrival date & time 06/08/12  1101   First MD Initiated Contact with Patient 06/08/12 1130      Chief Complaint  Patient presents with  . Migraine    (Consider location/radiation/quality/duration/timing/severity/associated sxs/prior treatment) HPI Pt is a 47yo male PMH migraines, HTN, and DM presenting with a headache that started gradually last night around 1700 and has since progressively worsened.  Pain is achy in nature 8/10 that is located on top of head and behind eyes, radiates to right side of neck. Similar to previous migraines. Pain not relieved by usual gabapentin.  Pt also reports photophobia. Denies nausea and blurred vision at this time. Denies trauma or recent illness.  No fever. Denies AMS or loss of balance.    Neurosurgeon: Dr. Cherre Blanc at [facility].  Appointment scheduled for next month 07/2012  Past Medical History  Diagnosis Date  . Hypertension   . Diabetes mellitus   . Hypercholesteremia     Past Surgical History  Procedure Laterality Date  . Cardiac catheterization  12/2011    No family history on file.  History  Substance Use Topics  . Smoking status: Never Smoker   . Smokeless tobacco: Not on file  . Alcohol Use: Yes     Comment: occasional      Review of Systems  Constitutional: Negative for fever and chills.  HENT: Positive for neck pain ( right side). Negative for ear pain, neck stiffness and tinnitus.   Eyes: Positive for photophobia. Negative for pain and visual disturbance.  Neurological: Positive for headaches. Negative for dizziness, weakness and light-headedness.  All other systems reviewed and are negative.    Allergies  Review of patient's allergies indicates no known allergies.  Home Medications   Current Outpatient Rx  Name  Route  Sig  Dispense  Refill  . amLODipine-valsartan (EXFORGE) 5-160 MG per tablet   Oral   Take 1 tablet by mouth daily.         [name] aspirin 81 MG tablet   Oral  Take 81 mg by mouth daily.         [name] atorvastatin (LIPITOR) 20 MG tablet   Oral   Take 20 mg by mouth daily.         . carvedilol (COREG) 6.25 MG tablet   Oral   Take 6.25 mg by mouth 2 (two) times daily with a meal.         . gabapentin (NEURONTIN) 300 MG capsule   Oral   Take 300 mg by mouth daily as needed. For migraines         . Guaifenesin 1200 MG TB12   Oral   Take 1 tablet (1,200 mg total) by mouth 2 (two) times daily.   20 each   0   . hydrochlorothiazide (HYDRODIURIL) 12.5 MG tablet   Oral   Take 12.5 mg by mouth daily.         [name] ibuprofen (ADVIL,MOTRIN) 800 MG tablet   Oral   Take 1 tablet (800 mg total) by mouth every 8 (eight) hours as needed for pain.   21 tablet   0   . Liraglutide (VICTOZA) 18 MG/3ML SOPN   Subcutaneous   Inject 1 each into the skin daily.         . metFORMIN (GLUCOPHAGE) 1000 MG tablet   Oral   Take 1 tablet (1,000 mg total) by mouth 2 (two) times daily with a meal.         .  verapamil (CALAN) 120 MG tablet   Oral   Take 120 mg by mouth daily.         . promethazine-dextromethorphan (PROMETHAZINE-DM) 6.25-15 MG/5ML syrup   Oral   Take 5 mLs by mouth 4 (four) times daily as needed for cough.   120 mL   0     BP 129/77  Pulse 70  Temp(Src) 97.6 F (36.4 C) (Oral)  Resp 23  SpO2 94%  Physical Exam  Nursing note and vitals reviewed. Constitutional: He is oriented to person, place, and time. He appears well-developed and well-nourished. No distress.  Pt lying comfortably in exam room.    HENT:  Head: Normocephalic and atraumatic.  Mouth/Throat: Oropharynx is clear and moist. No oropharyngeal exudate.  Eyes: Conjunctivae and EOM are normal. Pupils are equal, round, and reactive to light. Right eye exhibits no discharge. Left eye exhibits no discharge. No scleral icterus.  Neck: Normal range of motion. Neck supple.  Neck supple, FROM, no nuchale rigidity.  Cardiovascular: Normal rate, regular rhythm and  normal heart sounds.   Pulmonary/Chest: Effort normal and breath sounds normal. No respiratory distress. He has no wheezes.  Musculoskeletal: Normal range of motion. He exhibits no tenderness.  Neurological: He is alert and oriented to person, place, and time.  CN II-XII in tact, nl sensation and strength. Nl coordination. Neg romberg, nl gait.   Skin: Skin is warm and dry. He is not diaphoretic.  Psychiatric: He has a normal mood and affect. His behavior is normal. Judgment and thought content normal.    ED Course  Procedures (including critical care time)  Labs Reviewed  POCT I-STAT, CHEM 8 - Abnormal; Notable for the following:    Glucose, Bld 135 (*)    Calcium, Ion 1.24 (*)    All other components within normal limits   No results found.   1. Migraine       MDM  Pt with hx of HTN, DM, and migraines c/o headache that started gradually around 1700 last night.  Pt is seen by Dr. Cherre Blanc, neurologist for migraines. Took gabapentin w/o relief.  Took BP meds this morning but BP is 153/102 upon arrival to ED. Hx: pt felt fine up until last night.  No recent illness. No fever or cough. Denies trauma.  PE: neck supple, no nuchal rigidity. Eyes: PERRL, EOM in tact. Neuro exam: nl, CN II-XII in tact, sensation and strength in tact. Neg romberg, nl gait.  Started pt on fluids, compazine, benadryl, tylenol, and toradol.  Reassessed vitals: BP 129/77  Pain improved from 8/10 to 3/10 after tx.  Not concerned for [facility], stroke, or meningitis at this time. No further workup needed at this time.    Will discharge pt home.  Keep f/u appointment with Dr. Cherre Blanc next month.    Vitals: unremarkable. Discharged in stable condition.    Discussed pt with Dr. Ranae Palms during ED encounter.         Junius Finner, PA-C 06/08/12 2057

## 2012-06-10 NOTE — ED Provider Notes (Signed)
Medical screening examination/treatment/procedure(s) were performed by non-physician practitioner and as supervising physician I was immediately available for consultation/collaboration.   Loren Racer, MD 06/10/12 [phone]

## 2012-09-18 ENCOUNTER — Encounter: Payer: Self-pay | Admitting: Gastroenterology

## 2012-10-31 ENCOUNTER — Encounter: Payer: Self-pay | Admitting: Gastroenterology

## 2013-02-10 DIAGNOSIS — M75100 Unspecified rotator cuff tear or rupture of unspecified shoulder, not specified as traumatic: Secondary | ICD-10-CM | POA: Insufficient documentation

## 2013-06-03 ENCOUNTER — Encounter (HOSPITAL_COMMUNITY): Payer: Self-pay | Admitting: Emergency Medicine

## 2013-06-03 DIAGNOSIS — Z79899 Other long term (current) drug therapy: Secondary | ICD-10-CM | POA: Insufficient documentation

## 2013-06-03 DIAGNOSIS — Z7982 Long term (current) use of aspirin: Secondary | ICD-10-CM | POA: Insufficient documentation

## 2013-06-03 DIAGNOSIS — R112 Nausea with vomiting, unspecified: Secondary | ICD-10-CM | POA: Insufficient documentation

## 2013-06-03 DIAGNOSIS — E7800 Pure hypercholesterolemia, unspecified: Secondary | ICD-10-CM | POA: Insufficient documentation

## 2013-06-03 DIAGNOSIS — I10 Essential (primary) hypertension: Secondary | ICD-10-CM | POA: Insufficient documentation

## 2013-06-03 DIAGNOSIS — E119 Type 2 diabetes mellitus without complications: Secondary | ICD-10-CM | POA: Insufficient documentation

## 2013-06-03 LAB — CBC
HCT: 40.7 % (ref 39.0–52.0)
Hemoglobin: 13.3 g/dL (ref 13.0–17.0)
MCH: 26.5 pg (ref 26.0–34.0)
MCHC: 32.7 g/dL (ref 30.0–36.0)
MCV: 81.1 fL (ref 78.0–100.0)
PLATELETS: 256 10*3/uL (ref 150–400)
RBC: 5.02 MIL/uL (ref 4.22–5.81)
RDW: 14.4 % (ref 11.5–15.5)
WBC: 5.9 10*3/uL (ref 4.0–10.5)

## 2013-06-03 LAB — COMPREHENSIVE METABOLIC PANEL
ALBUMIN: 3.4 g/dL — AB (ref 3.5–5.2)
ALK PHOS: 74 U/L (ref 39–117)
ALT: 25 U/L (ref 0–53)
AST: 24 U/L (ref 0–37)
BILIRUBIN TOTAL: 0.3 mg/dL (ref 0.3–1.2)
BUN: 21 mg/dL (ref 6–23)
CHLORIDE: 101 meq/L (ref 96–112)
CO2: 24 meq/L (ref 19–32)
CREATININE: 1.14 mg/dL (ref 0.50–1.35)
Calcium: 8.1 mg/dL — ABNORMAL LOW (ref 8.4–10.5)
GFR calc Af Amer: 86 mL/min — ABNORMAL LOW (ref >90)
GFR, EST NON AFRICAN AMERICAN: 74 mL/min — AB (ref >90)
Glucose, Bld: 227 mg/dL — ABNORMAL HIGH (ref 70–99)
POTASSIUM: 4.1 meq/L (ref 3.7–5.3)
Sodium: 138 mEq/L (ref 137–147)
Total Protein: 7.7 g/dL (ref 6.0–8.3)

## 2013-06-03 MED ORDER — ONDANSETRON 4 MG PO TBDP
8.0000 mg | ORAL_TABLET | Freq: Once | ORAL | Status: AC
  Administered 2013-06-03: 8 mg via ORAL
  Filled 2013-06-03: qty 2

## 2013-06-03 MED ORDER — ONDANSETRON HCL 4 MG/2ML IJ SOLN
INTRAMUSCULAR | Status: AC
  Filled 2013-06-03: qty 2

## 2013-06-03 MED ORDER — ONDANSETRON HCL 4 MG/2ML IJ SOLN
4.0000 mg | Freq: Once | INTRAMUSCULAR | Status: AC
  Administered 2013-06-03: 4 mg via INTRAVENOUS

## 2013-06-03 MED ORDER — SODIUM CHLORIDE 0.9 % IV SOLN
1000.0000 mL | Freq: Once | INTRAVENOUS | Status: AC
  Administered 2013-06-03: 1000 mL via INTRAVENOUS

## 2013-06-03 NOTE — ED Notes (Signed)
Pt family reports that pt is becoming more dizzy.

## 2013-06-03 NOTE — ED Notes (Addendum)
vomiting acid since yesterday; Nausea, feels like food is sticking in esophagus. No diarr. Denies chest pain, SOB, or abdominal pain. Pt reports hx of hiatal hernia and GERD

## 2013-06-03 NOTE — ED Notes (Signed)
Patient states he is feeling dizzy with nausea

## 2013-06-04 ENCOUNTER — Emergency Department (HOSPITAL_COMMUNITY)
Admission: EM | Admit: 2013-06-04 | Discharge: 2013-06-04 | Disposition: A | Discharge status: Home / Self Care | Payer: 59 | Attending: Emergency Medicine | Admitting: Emergency Medicine

## 2013-06-04 DIAGNOSIS — M7540 Impingement syndrome of unspecified shoulder: Secondary | ICD-10-CM | POA: Insufficient documentation

## 2013-06-04 DIAGNOSIS — R112 Nausea with vomiting, unspecified: Secondary | ICD-10-CM

## 2013-06-04 LAB — URINALYSIS, ROUTINE W REFLEX MICROSCOPIC
BILIRUBIN URINE: NEGATIVE
Glucose, UA: >1000 mg/dL — AB
Hgb urine dipstick: NEGATIVE
KETONES UR: 15 mg/dL — AB
LEUKOCYTES UA: NEGATIVE
NITRITE: NEGATIVE
PROTEIN: NEGATIVE mg/dL
Specific Gravity, Urine: 1.029 (ref 1.005–1.030)
UROBILINOGEN UA: 0.2 mg/dL (ref 0.0–1.0)
pH: 5.5 (ref 5.0–8.0)

## 2013-06-04 LAB — URINE MICROSCOPIC-ADD ON

## 2013-06-04 MED ORDER — PROMETHAZINE HCL 25 MG PO TABS: 25.0000 mg | ORAL_TABLET | Freq: Four times a day (QID) | ORAL | Status: DC | PRN

## 2013-06-04 MED ORDER — ONDANSETRON HCL 4 MG/2ML IJ SOLN: 4.0000 mg | Freq: Once | INTRAMUSCULAR | Status: DC

## 2013-06-04 MED ORDER — ONDANSETRON 4 MG PO TBDP: 4.0000 mg | ORAL_TABLET | Freq: Three times a day (TID) | ORAL | Status: DC | PRN

## 2013-06-04 MED ORDER — SODIUM CHLORIDE 0.9 % IV BOLUS (SEPSIS)
1000.0000 mL | Freq: Once | INTRAVENOUS | Status: AC
  Administered 2013-06-04: 1000 mL via INTRAVENOUS

## 2013-06-04 NOTE — ED Notes (Signed)
Pt reports he hit his hand when he went to the restroom and requesting for IV to be removed.

## 2013-06-04 NOTE — Discharge Instructions (Signed)
Please call your doctor for a followup appointment within 24-48 hours. When you talk to your doctor please let them know that you were seen in the emergency department and have them acquire all of your records so that they can discuss the findings with you and formulate a treatment plan to fully care for your new and ongoing problems. ° °

## 2013-06-04 NOTE — ED Notes (Signed)
Pt given cup of ginger ale for fluid challenge per order from Dr. Hyacinth MeekerMiller.

## 2013-06-04 NOTE — ED Notes (Signed)
Pt tolerating the ginger ale and denies nausea or vomiting. Pt A&OX4, ambulatory with slow steady gait at discharge, verbalizing no complaints at this time.

## 2013-06-04 NOTE — ED Provider Notes (Signed)
CSN: [id number]     Arrival date & time 06/03/13  1926 History   First MD Initiated Contact with Patient 06/04/13 0101     Chief Complaint  Patient presents with  . Emesis     (Consider location/radiation/quality/duration/timing/severity/associated sxs/prior Treatment) HPI Comments: 48 year old male with a history of high blood pressure diabetes and high cholesterol who presents with a complaint of nausea and vomiting. His symptoms started approximately 27 hours ago before going in to work for the night shift. He has vomited up food and then followed this with stomach acid and has had multiple episodes of vomiting appears to make acid which is non-biliary and nonbloody since that time. His symptoms have not improved, they are persistent, it is not associated with fevers chills cough shortness of breath chest pain and he has no abdominal pain or back pain either. There is no swelling, no rashes, no dysuria or diarrhea and has no known sick contacts, no travel and has started no new medications. He denies any suspicious food intake.  Patient is a 48 y.o. male presenting with vomiting. The history is provided by the patient and the spouse.  Emesis   Past Medical History  Diagnosis Date  . Hypertension   . Diabetes mellitus   . Hypercholesteremia    Past Surgical History  Procedure Laterality Date  . Cardiac catheterization  12/2011   History reviewed. No pertinent family history. History  Substance Use Topics  . Smoking status: Never Smoker   . Smokeless tobacco: Not on file  . Alcohol Use: Yes     Comment: occasional    Review of Systems  Gastrointestinal: Positive for vomiting.  All other systems reviewed and are negative.     Allergies  Review of patient's allergies indicates no known allergies.  Home Medications   Prior to Admission medications   Medication Sig Start Date End Date Taking? Authorizing Provider  aspirin 81 MG tablet Take 81 mg by mouth daily.   Yes  Historical Provider, MD  atorvastatin (LIPITOR) 20 MG tablet Take 20 mg by mouth daily.   Yes Historical Provider, MD  gabapentin (NEURONTIN) 300 MG capsule Take 300 mg by mouth daily as needed. For migraines   Yes Historical Provider, MD  Guaifenesin 1200 MG TB12 Take 1 tablet (1,200 mg total) by mouth 2 (two) times daily. 02/09/12  Yes Jamesetta Orleanshristopher W Lawyer, PA-C  hydrochlorothiazide (HYDRODIURIL) 12.5 MG tablet Take 12.5 mg by mouth daily.   Yes Historical Provider, MD  ibuprofen (ADVIL,MOTRIN) 800 MG tablet Take 1 tablet (800 mg total) by mouth every 8 (eight) hours as needed for pain. 02/09/12  Yes Jamesetta Orleanshristopher W Lawyer, PA-C  Liraglutide (VICTOZA) 18 MG/3ML SOPN Inject 1 each into the skin daily.   Yes Historical Provider, MD  metFORMIN (GLUCOPHAGE) 1000 MG tablet Take 1 tablet (1,000 mg total) by mouth 2 (two) times daily with a meal. 11/15/11  Yes Pamella PertJagadeesh R Ganji, MD  verapamil (CALAN) 120 MG tablet Take 120 mg by mouth daily.   Yes Historical Provider, MD   BP 129/81  Pulse 88  Temp(Src) 99.9 F (37.7 C) (Oral)  Resp 18  SpO2 96% Physical Exam  Nursing note and vitals reviewed. Constitutional: He appears well-developed and well-nourished. No distress.  HENT:  Head: Normocephalic and atraumatic.  Mouth/Throat: Oropharynx is clear and moist. No oropharyngeal exudate.  Eyes: Conjunctivae and EOM are normal. Pupils are equal, round, and reactive to light. Right eye exhibits no discharge. Left eye exhibits no discharge. No scleral  icterus.  Neck: Normal range of motion. Neck supple. No JVD present. No thyromegaly present.  Cardiovascular: Normal rate, regular rhythm, normal heart sounds and intact distal pulses.  Exam reveals no gallop and no friction rub.   No murmur heard. Pulmonary/Chest: Effort normal and breath sounds normal. No respiratory distress. He has no wheezes. He has no rales.  Abdominal: Soft. Bowel sounds are normal. He exhibits no distension and no mass. There is no  tenderness.  No abdominal tenderness, no hepatosplenomegaly  Musculoskeletal: Normal range of motion. He exhibits no edema and no tenderness.  Lymphadenopathy:    He has no cervical adenopathy.  Neurological: He is alert. Coordination normal.  Skin: Skin is warm and dry. No rash noted. No erythema.  Psychiatric: He has a normal mood and affect. His behavior is normal.    ED Course  Procedures (including critical care time) Labs Review Labs Reviewed  COMPREHENSIVE METABOLIC PANEL - Abnormal; Notable for the following:    Glucose, Bld 227 (*)    Calcium 8.1 (*)    Albumin 3.4 (*)    GFR calc non Af Amer 74 (*)    GFR calc Af Amer 86 (*)    All other components within normal limits  URINALYSIS, ROUTINE W REFLEX MICROSCOPIC - Abnormal; Notable for the following:    Glucose, UA >1000 (*)    Ketones, ur 15 (*)    All other components within normal limits  CBC  URINE MICROSCOPIC-ADD ON  URINALYSIS, ROUTINE W REFLEX MICROSCOPIC    Imaging Review No results found.    MDM   Final diagnoses:  Nausea and vomiting    The patient appears otherwise in good health, he is not tachycardic rate on my exam, he will get IV fluids, antiemetics and check a urinalysis to make sure he has no signs of infection as a diabetic. His liver function tests are normal, gallbladder test normal, glucose elevated but this is expected as he has not had his medications today.  Improved, fluid challenge successful, labs unremarkable including urinalysis other than glucosuria and ketonuria, IV fluids given, stable for discharge   Meds given in ED:  Medications  ondansetron (ZOFRAN) injection 4 mg (4 mg Intravenous Not Given 06/04/13 0303)  ondansetron (ZOFRAN-ODT) disintegrating tablet 8 mg (8 mg Oral Given 06/03/13 1957)  0.9 %  sodium chloride infusion (0 mLs Intravenous Stopped 06/03/13 2301)  ondansetron (ZOFRAN) injection 4 mg (4 mg Intravenous Given 06/03/13 2220)  sodium chloride 0.9 % bolus 1,000 mL  (0 mLs Intravenous Stopped 06/04/13 0239)    New Prescriptions   ONDANSETRON (ZOFRAN ODT) 4 MG DISINTEGRATING TABLET    Take 1 tablet (4 mg total) by mouth every 8 (eight) hours as needed for nausea.   PROMETHAZINE (PHENERGAN) 25 MG TABLET    Take 1 tablet (25 mg total) by mouth every 6 (six) hours as needed for nausea or vomiting.      Vida RollerBrian D Orry Sigl, MD 06/04/13 [phone]

## 2013-09-03 DIAGNOSIS — Z9889 Other specified postprocedural states: Secondary | ICD-10-CM | POA: Insufficient documentation

## 2013-10-28 DIAGNOSIS — G4733 Obstructive sleep apnea (adult) (pediatric): Secondary | ICD-10-CM | POA: Insufficient documentation

## 2013-10-28 DIAGNOSIS — Z9889 Other specified postprocedural states: Secondary | ICD-10-CM | POA: Insufficient documentation

## 2013-10-28 DIAGNOSIS — Z8669 Personal history of other diseases of the nervous system and sense organs: Secondary | ICD-10-CM | POA: Insufficient documentation

## 2013-10-28 DIAGNOSIS — G43019 Migraine without aura, intractable, without status migrainosus: Secondary | ICD-10-CM | POA: Insufficient documentation

## 2013-10-28 DIAGNOSIS — E1165 Type 2 diabetes mellitus with hyperglycemia: Secondary | ICD-10-CM | POA: Insufficient documentation

## 2013-10-28 DIAGNOSIS — E785 Hyperlipidemia, unspecified: Secondary | ICD-10-CM | POA: Insufficient documentation

## 2013-10-28 DIAGNOSIS — IMO0002 Reserved for concepts with insufficient information to code with codable children: Secondary | ICD-10-CM | POA: Insufficient documentation

## 2014-01-14 ENCOUNTER — Encounter (HOSPITAL_COMMUNITY): Payer: Self-pay | Admitting: Cardiology

## 2015-08-14 ENCOUNTER — Encounter (HOSPITAL_COMMUNITY): Payer: Self-pay | Admitting: Emergency Medicine

## 2015-08-14 ENCOUNTER — Emergency Department (HOSPITAL_COMMUNITY)
Admission: EM | Admit: 2015-08-14 | Discharge: 2015-08-14 | Disposition: A | Discharge status: Home / Self Care | Payer: 59 | Attending: Emergency Medicine | Admitting: Emergency Medicine

## 2015-08-14 DIAGNOSIS — I10 Essential (primary) hypertension: Secondary | ICD-10-CM | POA: Insufficient documentation

## 2015-08-14 DIAGNOSIS — Z7984 Long term (current) use of oral hypoglycemic drugs: Secondary | ICD-10-CM | POA: Insufficient documentation

## 2015-08-14 DIAGNOSIS — G43009 Migraine without aura, not intractable, without status migrainosus: Secondary | ICD-10-CM | POA: Insufficient documentation

## 2015-08-14 DIAGNOSIS — Z7982 Long term (current) use of aspirin: Secondary | ICD-10-CM | POA: Insufficient documentation

## 2015-08-14 DIAGNOSIS — Z79899 Other long term (current) drug therapy: Secondary | ICD-10-CM | POA: Insufficient documentation

## 2015-08-14 DIAGNOSIS — E119 Type 2 diabetes mellitus without complications: Secondary | ICD-10-CM | POA: Insufficient documentation

## 2015-08-14 MED ORDER — DIPHENHYDRAMINE HCL 50 MG/ML IJ SOLN
25.0000 mg | Freq: Once | INTRAMUSCULAR | Status: AC
  Administered 2015-08-14: 25 mg via INTRAVENOUS
  Filled 2015-08-14: qty 1

## 2015-08-14 MED ORDER — PROCHLORPERAZINE EDISYLATE 5 MG/ML IJ SOLN
10.0000 mg | Freq: Once | INTRAMUSCULAR | Status: AC
  Administered 2015-08-14: 10 mg via INTRAVENOUS
  Filled 2015-08-14: qty 2

## 2015-08-14 MED ORDER — SODIUM CHLORIDE 0.9 % IV BOLUS (SEPSIS)
1000.0000 mL | Freq: Once | INTRAVENOUS | Status: AC
  Administered 2015-08-14: 1000 mL via INTRAVENOUS

## 2015-08-14 MED ORDER — KETOROLAC TROMETHAMINE 30 MG/ML IJ SOLN
30.0000 mg | Freq: Once | INTRAMUSCULAR | Status: AC
  Administered 2015-08-14: 30 mg via INTRAVENOUS
  Filled 2015-08-14: qty 1

## 2015-08-14 NOTE — ED Provider Notes (Signed)
CSN: [id number]     Arrival date & time 08/14/15  1857 History   First MD Initiated Contact with Patient 08/14/15 1910     Chief Complaint  Patient presents with  . Migraine     (Consider location/radiation/quality/duration/timing/severity/associated sxs/prior Treatment) HPI Patient presents to the emergency department with migraine headache.  This started earlier today.  The patient states that he took Excedrin Migraine without relief of his symptoms.  Patient states that nothing seems make the condition better, but light and sound make the pain worse.  The patient's had nausea and vomiting.  The patient states that he has had similar headaches in the past. The patient denies chest pain, shortness of breath,blurred vision, neck pain, fever, cough, weakness, numbness, dizziness, anorexia, edema, abdominal pain,diarrhea, rash, back pain, dysuria, hematemesis, bloody stool, near syncope, or syncope. Past Medical History  Diagnosis Date  . Hypertension   . Diabetes mellitus   . Hypercholesteremia    Past Surgical History  Procedure Laterality Date  . Cardiac catheterization  12/2011  . Left heart catheterization with coronary angiogram N/A 11/12/2011    Procedure: LEFT HEART CATHETERIZATION WITH CORONARY ANGIOGRAM;  Surgeon: Pamella Pert, MD;  Location: [facility] CATH LAB;  Service: Cardiovascular;  Laterality: N/A;   History reviewed. No pertinent family history. Social History  Substance Use Topics  . Smoking status: Never Smoker   . Smokeless tobacco: None  . Alcohol Use: Yes     Comment: occasional    Review of Systems All other systems negative except as documented in the HPI. All pertinent positives and negatives as reviewed in the HPI.=   Allergies  Review of patient's allergies indicates no known allergies.  Home Medications   Prior to Admission medications   Medication Sig Start Date End Date Taking? Authorizing Provider  aspirin 81 MG tablet Take 81 mg by mouth daily.    Yes Historical Provider, MD  atorvastatin (LIPITOR) 40 MG tablet Take 40 mg by mouth daily.  05/06/15  Yes Historical Provider, MD  canagliflozin (INVOKANA) 300 MG TABS tablet Take 300 mg by mouth daily before breakfast.    Yes Historical Provider, MD  gabapentin (NEURONTIN) 300 MG capsule Take 900 mg by mouth daily as needed (migraines). For migraines   Yes Historical Provider, MD  glimepiride (AMARYL) 4 MG tablet Take 4 mg by mouth at bedtime.  07/25/15  Yes Historical Provider, MD  hydrochlorothiazide (HYDRODIURIL) 12.5 MG tablet Take 12.5 mg by mouth daily.   Yes Historical Provider, MD  metFORMIN (GLUCOPHAGE) 1000 MG tablet Take 1 tablet (1,000 mg total) by mouth 2 (two) times daily with a meal. Patient taking differently: Take 1,000 mg by mouth daily with breakfast.  11/15/11  Yes Yates Decamp, MD  omeprazole (PRILOSEC) 40 MG capsule Take 40 mg by mouth daily as needed (indigestion).  05/06/15  Yes Historical Provider, MD  timolol (TIMOPTIC-XR) 0.5 % ophthalmic gel-forming Place 1 drop into both eyes at bedtime.  08/10/15  Yes Historical Provider, MD  valsartan-hydrochlorothiazide (DIOVAN-HCT) 160-25 MG tablet Take 1 tablet by mouth daily.  07/07/15  Yes Historical Provider, MD  verapamil (CALAN) 120 MG tablet Take 120 mg by mouth daily.   Yes Historical Provider, MD  Guaifenesin 1200 MG TB12 Take 1 tablet (1,200 mg total) by mouth 2 (two) times daily. Patient not taking: Reported on 08/14/2015 02/09/12   Charlestine Night, PA-C  ibuprofen (ADVIL,MOTRIN) 800 MG tablet Take 1 tablet (800 mg total) by mouth every 8 (eight) hours as needed for pain.  Patient not taking: Reported on 08/14/2015 02/09/12   Charlestine Nighthristopher Keilyn Nadal, PA-C  ondansetron (ZOFRAN ODT) 4 MG disintegrating tablet Take 1 tablet (4 mg total) by mouth every 8 (eight) hours as needed for nausea. 06/04/13   Eber HongBrian Miller, MD  promethazine (PHENERGAN) 25 MG tablet Take 1 tablet (25 mg total) by mouth every 6 (six) hours as needed for nausea or vomiting.  06/04/13   Eber HongBrian Miller, MD   BP 162/88 mmHg  Pulse 90  Temp(Src) 98.1 F (36.7 C) (Oral)  Resp 18  SpO2 100% Physical Exam  Constitutional: He is oriented to person, place, and time. He appears well-developed and well-nourished. No distress.  HENT:  Head: Normocephalic and atraumatic.  Mouth/Throat: Oropharynx is clear and moist.  Eyes: Pupils are equal, round, and reactive to light.  Neck: Normal range of motion. Neck supple.  Cardiovascular: Normal rate, regular rhythm and normal heart sounds.  Exam reveals no gallop and no friction rub.   No murmur heard. Pulmonary/Chest: Effort normal and breath sounds normal. No respiratory distress. He has no wheezes.  Neurological: He is alert and oriented to person, place, and time. He exhibits normal muscle tone. Coordination normal.  Skin: Skin is warm and dry. No rash noted. No erythema.  Psychiatric: He has a normal mood and affect. His behavior is normal.  Nursing note and vitals reviewed.   ED Course  Procedures (including critical care time) Labs Review Labs Reviewed - No data to display  Imaging Review No results found. I have personally reviewed and evaluated these images and lab results as part of my medical decision-making.  Patient is completely resolved of his headache at this time.  Patient is advised to return here as needed.    Charlestine NightChristopher Romuald Mccaslin, PA-C 08/14/15 2101  Benjiman CoreNathan Pickering, MD 08/14/15 [phone]

## 2015-08-14 NOTE — ED Notes (Signed)
Pthas a hx of migraines and started having one today with N/V. Alert and oriented.

## 2015-08-14 NOTE — Discharge Instructions (Signed)
Return here as needed.  Follow up with a primary care doctor °

## 2015-09-02 ENCOUNTER — Ambulatory Visit
Admission: RE | Admit: 2015-09-02 | Discharge: 2015-09-02 | Disposition: A | Discharge status: Home / Self Care | Payer: 59 | Source: Ambulatory Visit | Attending: Physician Assistant | Admitting: Physician Assistant

## 2015-09-02 ENCOUNTER — Other Ambulatory Visit: Payer: Self-pay | Admitting: Physician Assistant

## 2015-09-02 DIAGNOSIS — M542 Cervicalgia: Secondary | ICD-10-CM

## 2016-03-17 ENCOUNTER — Encounter (HOSPITAL_COMMUNITY): Payer: Self-pay

## 2016-03-17 ENCOUNTER — Emergency Department (HOSPITAL_COMMUNITY)
Admission: EM | Admit: 2016-03-17 | Discharge: 2016-03-17 | Disposition: A | Discharge status: Home / Self Care | Payer: 59 | Attending: Emergency Medicine | Admitting: Emergency Medicine

## 2016-03-17 DIAGNOSIS — M62830 Muscle spasm of back: Secondary | ICD-10-CM | POA: Insufficient documentation

## 2016-03-17 DIAGNOSIS — Z7982 Long term (current) use of aspirin: Secondary | ICD-10-CM | POA: Diagnosis not present

## 2016-03-17 DIAGNOSIS — Z7984 Long term (current) use of oral hypoglycemic drugs: Secondary | ICD-10-CM | POA: Diagnosis not present

## 2016-03-17 DIAGNOSIS — M545 Low back pain, unspecified: Secondary | ICD-10-CM

## 2016-03-17 DIAGNOSIS — E119 Type 2 diabetes mellitus without complications: Secondary | ICD-10-CM | POA: Diagnosis not present

## 2016-03-17 DIAGNOSIS — I10 Essential (primary) hypertension: Secondary | ICD-10-CM | POA: Insufficient documentation

## 2016-03-17 MED ORDER — LIDOCAINE 5 % EX PTCH: 1.0000 | MEDICATED_PATCH | CUTANEOUS | 0 refills | Status: DC

## 2016-03-17 MED ORDER — DICLOFENAC SODIUM 50 MG PO TBEC: 50.0000 mg | DELAYED_RELEASE_TABLET | Freq: Two times a day (BID) | ORAL | 0 refills | Status: DC

## 2016-03-17 MED ORDER — METHOCARBAMOL 500 MG PO TABS: 500.0000 mg | ORAL_TABLET | Freq: Two times a day (BID) | ORAL | 0 refills | Status: DC
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
## 2016-03-17 NOTE — ED Notes (Signed)
Declined W/C at D/C and was escorted to lobby by RN. 

## 2016-03-17 NOTE — ED Triage Notes (Signed)
Pt. Woke up with rt. Lower back pain on Wednesday.  Pt. Denies any injuries. Denies any numbness or tingling.  Pt. Denies any urinary problems.  Skin is warm and dry.  Pt. Is alert and oriented X4./

## 2016-06-06 ENCOUNTER — Other Ambulatory Visit (INDEPENDENT_AMBULATORY_CARE_PROVIDER_SITE_OTHER): Payer: 59

## 2016-06-06 ENCOUNTER — Encounter: Payer: Self-pay | Admitting: Family

## 2016-06-06 ENCOUNTER — Ambulatory Visit (INDEPENDENT_AMBULATORY_CARE_PROVIDER_SITE_OTHER): Payer: 59 | Admitting: Family

## 2016-06-06 ENCOUNTER — Other Ambulatory Visit: Payer: Self-pay | Admitting: *Deleted

## 2016-06-06 VITALS — BP 160/90 | HR 78 | Resp 16 | Ht 68.0 in | Wt 245.0 lb

## 2016-06-06 DIAGNOSIS — K219 Gastro-esophageal reflux disease without esophagitis: Secondary | ICD-10-CM | POA: Diagnosis not present

## 2016-06-06 DIAGNOSIS — E119 Type 2 diabetes mellitus without complications: Secondary | ICD-10-CM | POA: Diagnosis not present

## 2016-06-06 DIAGNOSIS — I10 Essential (primary) hypertension: Secondary | ICD-10-CM | POA: Insufficient documentation

## 2016-06-06 LAB — COMPREHENSIVE METABOLIC PANEL
ALBUMIN: 4.3 g/dL (ref 3.5–5.2)
ALK PHOS: 62 U/L (ref 39–117)
ALT: 20 U/L (ref 0–53)
AST: 18 U/L (ref 0–37)
BUN: 11 mg/dL (ref 6–23)
CALCIUM: 9.5 mg/dL (ref 8.4–10.5)
CO2: 31 mEq/L (ref 19–32)
CREATININE: 1.08 mg/dL (ref 0.40–1.50)
Chloride: 102 mEq/L (ref 96–112)
GFR: 92.11 mL/min (ref >60.00)
Glucose, Bld: 90 mg/dL (ref 70–99)
POTASSIUM: 3.7 meq/L (ref 3.5–5.1)
SODIUM: 140 meq/L (ref 135–145)
TOTAL PROTEIN: 8 g/dL (ref 6.0–8.3)
Total Bilirubin: 0.3 mg/dL (ref 0.2–1.2)

## 2016-06-06 LAB — MICROALBUMIN / CREATININE URINE RATIO
Creatinine,U: 114.2 mg/dL
Microalb Creat Ratio: 1.4 mg/g (ref 0.0–30.0)
Microalb, Ur: 1.6 mg/dL (ref 0.0–1.9)

## 2016-06-06 LAB — HEMOGLOBIN A1C: HEMOGLOBIN A1C: 8.5 % — AB (ref 4.6–6.5)

## 2016-06-06 MED ORDER — ATORVASTATIN CALCIUM 40 MG PO TABS: 40.0000 mg | ORAL_TABLET | Freq: Every day | ORAL | 0 refills | Status: DC

## 2016-06-06 NOTE — Patient Instructions (Signed)
Thank you for choosing [facility].  SUMMARY AND INSTRUCTIONS:  Please continue to take your medications as prescribed.  We will check your blood work today and make adjustments to medications as needed.  Please schedule a time for your physical at your convenience.  We will follow-up pending blood work results.  Please obtain a blood pressure cuff and monitor your blood pressure at home daily and record those numbers.  Please continue to monitor your blood sugar 1-2 times per day.   Medication:  Your prescription(s) have been submitted to your pharmacy or been printed and provided for you. Please take as directed and contact our office if you believe you are having problem(s) with the medication(s) or have any questions.  Labs:  Please stop by the lab on the lower level of the building for your blood work. Your results will be released to MyChart (or called to you) after review, usually within 72 hours after test completion. If any changes need to be made, you will be notified at that same time.  1.) The lab is open from 7:30am to 5:30 pm Monday-Friday 2.) No appointment is necessary 3.) Fasting (if needed) is 6-8 hours after food and drink; black coffee and water are okay   Follow up:  If your symptoms worsen or fail to improve, please contact our office for further instruction, or in case of emergency go directly to the emergency room at the closest medical facility.

## 2016-06-06 NOTE — Progress Notes (Signed)
Subjective:    Patient ID: Jason Ortega, male    DOB: 10/27/1965, 51 y.o.   MRN: [id number]  Chief Complaint  Patient presents with  . Establish Care    diabetes management    HPI:  Jason Ortega is a 51 y.o. male who  has a past medical history of Allergy; Diabetes mellitus; Frequent headaches; Glaucoma; Hypercholesteremia; Hypertension; Migraines; and Sleep apnea. and presents today for an office visit to establish care.  1.) Type 2 diabetes - Currently maintained metformin and glimepiride. Reports taking the medication as prescribed and denies adverse side effects. Previously on Invokana and Trulicity. Was not able to afford Trulicity. Blood sugars are not currently checked at home. Denies any numbness tingling. No excessive hunger, thirst or urination. Working on Journalist, newspaper.   2.)  Hypertension - Currently maintained on verapamil and losartan-hctz. Rreports taking the medications as prescribed and denies adverse side effects or hypotensive readings. Not currently checking blood pressures at home. Denies changes in vision, worst headache of life or new symptoms of end organ damage. Working on following a low sodium diet.   BP Readings from Last 3 Encounters:  06/06/16 (!) 160/90  03/17/16 154/98  08/14/15 140/86     3.) GERD - Currently maintained on omeprazole. Reports taking the medication as prescribed and denies adverse side effects. Symptoms are medically controlled current medication regimen and nutritional intake.   No Known Allergies    Outpatient Medications Prior to Visit  Medication Sig Dispense Refill  . aspirin 81 MG tablet Take 81 mg by mouth daily.    [name] atorvastatin (LIPITOR) 40 MG tablet Take 40 mg by mouth daily.     [name] gabapentin (NEURONTIN) 300 MG capsule Take 900 mg by mouth daily as needed (migraines). For migraines    . glimepiride (AMARYL) 4 MG tablet Take 4 mg by mouth at bedtime.     . metFORMIN (GLUCOPHAGE) 1000 MG tablet Take 1 tablet (1,000 mg total)  by mouth 2 (two) times daily with a meal. (Patient taking differently: Take 1,000 mg by mouth daily with breakfast. )    . methocarbamol (ROBAXIN) 500 MG tablet Take 1 tablet (500 mg total) by mouth 2 (two) times daily. 20 tablet 0  . omeprazole (PRILOSEC) 40 MG capsule Take 40 mg by mouth daily as needed (indigestion).     . verapamil (CALAN) 120 MG tablet Take 120 mg by mouth daily.    . valsartan-hydrochlorothiazide (DIOVAN-HCT) 160-25 MG tablet Take 1 tablet by mouth daily.     . canagliflozin (INVOKANA) 300 MG TABS tablet Take 300 mg by mouth daily before breakfast.     . diclofenac (VOLTAREN) 50 MG EC tablet Take 1 tablet (50 mg total) by mouth 2 (two) times daily. 15 tablet 0  . lidocaine (LIDODERM) 5 % Place 1 patch onto the skin daily. Remove & Discard patch within 12 hours or as directed by MD 30 patch 0   No facility-administered medications prior to visit.      Past Medical History:  Diagnosis Date  . Allergy   . Diabetes mellitus   . Frequent headaches   . Glaucoma   . Hypercholesteremia   . Hypertension   . Migraines   . Sleep apnea       Past Surgical History:  Procedure Laterality Date  . CARDIAC CATHETERIZATION  12/2011  . LEFT HEART CATHETERIZATION WITH CORONARY ANGIOGRAM N/A 11/12/2011   Procedure: LEFT HEART CATHETERIZATION WITH CORONARY ANGIOGRAM;  Surgeon: Pamella Pert, MD;  Location: [facility] CATH LAB;  Service: Cardiovascular;  Laterality: N/A;  . SHOULDER SURGERY        Family History  Problem Relation Age of Onset  . Arthritis Mother   . Stroke Mother   . Hypertension Father   . Diabetes Father   . Cancer Father   . Diabetes Maternal Grandfather       Social History   Social History  . Marital status: Married    Spouse name: N/A  . Number of children: 2  . Years of education: 13   Occupational History  . [employer]    Social History Main Topics  . Smoking status: Never Smoker  . Smokeless tobacco: Never Used  . Alcohol use Yes      Comment: occasional  . Drug use: No  . Sexual activity: Yes    Birth control/ protection: None   Other Topics Concern  . Not on file   Social History Narrative   Fun/Hobby: Coach football         Review of Systems  Constitutional: Negative for chills and fever.  Eyes:       Negative for changes in vision  Respiratory: Negative for cough, chest tightness, shortness of breath and wheezing.   Cardiovascular: Negative for chest pain, palpitations and leg swelling.  Endocrine: Negative for polydipsia, polyphagia and polyuria.  Neurological: Negative for dizziness, weakness, light-headedness and headaches.       Objective:    BP (!) 160/90 (BP Location: Left Arm, Patient Position: Sitting, Cuff Size: Large)   Pulse 78   Resp 16   Ht  (1.727 m)   Wt 245 lb (111.1 kg)   SpO2 98%   BMI 37.25 kg/m  Nursing note and vital signs reviewed.  Physical Exam  Constitutional: He is oriented to person, place, and time. He appears well-developed and well-nourished. No distress.  Cardiovascular: Normal rate, regular rhythm and intact distal pulses.  Exam reveals no gallop and no friction rub.   No murmur heard. Pulmonary/Chest: Effort normal and breath sounds normal. No respiratory distress. He has no wheezes. He has no rales. He exhibits no tenderness.  Neurological: He is alert and oriented to person, place, and time.  Skin: Skin is warm and dry.  Psychiatric: He has a normal mood and affect. His behavior is normal. Judgment and thought content normal.        Assessment & Plan:   Problem List Items Addressed This Visit      Cardiovascular and Mediastinum   Essential hypertension    Blood pressure poorly controlled with current medication regimen and no adverse side effects above goal 140/90. Encouraged to monitor blood pressure at home through purchasing a blood pressure cuff. Continue current dosage of verapamil and losartan-hydrochlorothiazide. Denies worst headache of  life with no symptoms of end organ damage noted on physical exam. Recommend following a low-sodium diet.      Relevant Medications   losartan-hydrochlorothiazide (HYZAAR) 100-12.5 MG tablet     Digestive   Gastroesophageal reflux disease without esophagitis    Gastroesophageal reflux appears adequate control with current medication regimen and no adverse side effects. Continue current dosage of omeprazole. Discussed importance of GERD-related diet to help reduce symptoms. Continue to monitor.        Endocrine   Type 2 diabetes mellitus without complication, without long-term current use of insulin (HCC) - Primary    Obtain hemoglobin A1c, urine microalbumin, and cooperative metabolic panel. Diabetes appears adequately controlled based on home blood  sugar reports. In transition between [place] and Bydureon secondary to insurance. Continue current dosage of metformin and glimepiride. Previously on Invokana and was discontinued secondary to [employer]. Pneumovax is up-to-date. Diabetic foot exam completed today. Diabetic eye exam is up-to-date. Currently maintained on losartan and atorvastatin for CAD risk reduction. Encouraged to monitor blood sugars 1-2 times daily. Follow-up in 3 months pending A1c results      Relevant Medications   losartan-hydrochlorothiazide (HYZAAR) 100-12.5 MG tablet   Other Relevant Orders   Hemoglobin A1c (Completed)   Urine Microalbumin w/creat. ratio (Completed)   Comprehensive metabolic panel (Completed)       I have discontinued Mr. Krol canagliflozin, valsartan-hydrochlorothiazide, diclofenac, and lidocaine. I am also having him maintain his aspirin, gabapentin, verapamil, metFORMIN, atorvastatin, glimepiride, omeprazole, methocarbamol, and losartan-hydrochlorothiazide.   Meds ordered this encounter  Medications  . losartan-hydrochlorothiazide (HYZAAR) 100-12.5 MG tablet    Sig: Take 1 tablet by mouth daily.     Follow-up: Return in  about 3 months (around 09/06/2016), or if symptoms worsen or fail to improve.  Jeanine Luz, FNP

## 2016-06-06 NOTE — Assessment & Plan Note (Signed)
Gastroesophageal reflux appears adequate control with current medication regimen and no adverse side effects. Continue current dosage of omeprazole. Discussed importance of GERD-related diet to help reduce symptoms. Continue to monitor.

## 2016-06-06 NOTE — Assessment & Plan Note (Signed)
Obtain hemoglobin A1c, urine microalbumin, and cooperative metabolic panel. Diabetes appears adequately controlled based on home blood sugar reports. In transition between [place] and [place] secondary to insurance. Continue current dosage of metformin and glimepiride. Previously on Invokana and was discontinued secondary to [employer]. Pneumovax is up-to-date. Diabetic foot exam completed today. Diabetic eye exam is up-to-date. Currently maintained on losartan and atorvastatin for CAD risk reduction. Encouraged to monitor blood sugars 1-2 times daily. Follow-up in 3 months pending A1c results

## 2016-06-06 NOTE — Assessment & Plan Note (Signed)
Blood pressure poorly controlled with current medication regimen and no adverse side effects above goal 140/90. Encouraged to monitor blood pressure at home through purchasing a blood pressure cuff. Continue current dosage of verapamil and losartan-hydrochlorothiazide. Denies worst headache of life with no symptoms of end organ damage noted on physical exam. Recommend following a low-sodium diet.

## 2016-06-25 ENCOUNTER — Other Ambulatory Visit: Payer: Self-pay | Admitting: *Deleted

## 2016-06-25 MED ORDER — OMEPRAZOLE 40 MG PO CPDR: 40.0000 mg | DELAYED_RELEASE_CAPSULE | Freq: Every day | ORAL | 1 refills | Status: DC | PRN

## 2016-06-25 NOTE — Telephone Encounter (Signed)
Rec'd call pt requesting refill on his Omeprazole. Verified chart inform will send to [facility]...Raechel Chute/lmb

## 2016-07-05 ENCOUNTER — Telehealth: Payer: Self-pay | Admitting: *Deleted

## 2016-07-05 MED ORDER — LOSARTAN POTASSIUM-HCTZ 100-12.5 MG PO TABS: 1.0000 | ORAL_TABLET | Freq: Every day | ORAL | 0 refills | Status: DC

## 2016-07-05 NOTE — Telephone Encounter (Signed)
Rec'd call pt is needing refills on medications. He stated greg was starting his on Bydureon not sure if he needs to also take oral diabetic meds to if eill need rx sent to [facility] since he is now his provider...Raechel Chute/lmb

## 2016-07-06 ENCOUNTER — Other Ambulatory Visit: Payer: Self-pay | Admitting: Family

## 2016-07-06 MED ORDER — GLIMEPIRIDE 4 MG PO TABS: 4.0000 mg | ORAL_TABLET | Freq: Every day | ORAL | 0 refills | Status: DC

## 2016-07-06 MED ORDER — METFORMIN HCL 1000 MG PO TABS: 1000.0000 mg | ORAL_TABLET | Freq: Two times a day (BID) | ORAL | 0 refills | Status: DC

## 2016-07-06 NOTE — Telephone Encounter (Signed)
Yes, please continue to take oral medications. Refill sent to pharmacy.

## 2016-07-09 NOTE — Telephone Encounter (Signed)
LVM letting pt know.  

## 2016-07-19 NOTE — Telephone Encounter (Signed)
Please resend Bydureon to [facility] on the [address] of [address]  They did not receive it

## 2016-07-20 MED ORDER — EXENATIDE ER 2 MG ~~LOC~~ PEN: PEN_INJECTOR | SUBCUTANEOUS | 2 refills | Status: DC

## 2016-07-20 NOTE — Addendum Note (Signed)
Addended by: Mercer PodWRENN, Mareli Antunes E on: 07/20/2016 08:48 AM   Modules accepted: Orders

## 2016-07-20 NOTE — Telephone Encounter (Signed)
Rx sent 

## 2016-07-26 ENCOUNTER — Other Ambulatory Visit: Payer: Self-pay | Admitting: *Deleted

## 2016-07-26 MED ORDER — METFORMIN HCL 1000 MG PO TABS: 1000.0000 mg | ORAL_TABLET | Freq: Two times a day (BID) | ORAL | 0 refills | Status: DC

## 2016-07-31 ENCOUNTER — Telehealth: Payer: Self-pay | Admitting: Internal Medicine

## 2016-07-31 MED ORDER — ONETOUCH VERIO W/DEVICE KIT: 1.0000 | PACK | Freq: Two times a day (BID) | 0 refills | Status: DC | PRN

## 2016-07-31 NOTE — Telephone Encounter (Signed)
Needs a new Psychologist, educationalnetouch Verio device

## 2016-08-16 ENCOUNTER — Encounter (HOSPITAL_COMMUNITY): Payer: Self-pay | Admitting: Emergency Medicine

## 2016-08-16 ENCOUNTER — Emergency Department (HOSPITAL_COMMUNITY)
Admission: EM | Admit: 2016-08-16 | Discharge: 2016-08-16 | Disposition: A | Discharge status: Home / Self Care | Payer: 59 | Attending: Emergency Medicine | Admitting: Emergency Medicine

## 2016-08-16 DIAGNOSIS — I10 Essential (primary) hypertension: Secondary | ICD-10-CM

## 2016-08-16 DIAGNOSIS — Z794 Long term (current) use of insulin: Secondary | ICD-10-CM | POA: Diagnosis not present

## 2016-08-16 DIAGNOSIS — Z7982 Long term (current) use of aspirin: Secondary | ICD-10-CM | POA: Diagnosis not present

## 2016-08-16 DIAGNOSIS — G43909 Migraine, unspecified, not intractable, without status migrainosus: Secondary | ICD-10-CM | POA: Insufficient documentation

## 2016-08-16 DIAGNOSIS — R110 Nausea: Secondary | ICD-10-CM | POA: Diagnosis not present

## 2016-08-16 DIAGNOSIS — E119 Type 2 diabetes mellitus without complications: Secondary | ICD-10-CM | POA: Insufficient documentation

## 2016-08-16 DIAGNOSIS — R51 Headache: Secondary | ICD-10-CM | POA: Diagnosis present

## 2016-08-16 DIAGNOSIS — H53149 Visual discomfort, unspecified: Secondary | ICD-10-CM | POA: Insufficient documentation

## 2016-08-16 DIAGNOSIS — Z79899 Other long term (current) drug therapy: Secondary | ICD-10-CM | POA: Diagnosis not present

## 2016-08-16 MED ORDER — KETOROLAC TROMETHAMINE 30 MG/ML IJ SOLN
30.0000 mg | Freq: Once | INTRAMUSCULAR | Status: AC
  Administered 2016-08-16: 30 mg via INTRAVENOUS
  Filled 2016-08-16: qty 1

## 2016-08-16 MED ORDER — PROCHLORPERAZINE EDISYLATE 5 MG/ML IJ SOLN
10.0000 mg | Freq: Once | INTRAMUSCULAR | Status: AC
  Administered 2016-08-16: 10 mg via INTRAVENOUS
  Filled 2016-08-16: qty 2

## 2016-08-16 MED ORDER — DIPHENHYDRAMINE HCL 50 MG/ML IJ SOLN
12.5000 mg | Freq: Once | INTRAMUSCULAR | Status: AC
  Administered 2016-08-16: 12.5 mg via INTRAVENOUS
  Filled 2016-08-16: qty 1

## 2016-08-16 NOTE — ED Provider Notes (Signed)
[facility]-EMERGENCY DEPT Provider Note   CSN: [id number] Arrival date & time: 08/16/16  1603     History   Chief Complaint Chief Complaint  Patient presents with  . Headache    HPI Jason Ortega is a 51 y.o. male.  HPI Patient presents to the emergency room for evaluation of a persistent headache. Patient does have a history of migraine headaches. He is followed at [facility]. Patient states he takes gabapentin to help prevent his headaches. He occasionally has breakthrough headaches but they are not frequent. Started with a headache this morning about 5 AM. He took his gabapentin however that has not helped. Headache is in the bifrontal area and more focused behind his right eye. He is having photophobia. Some nausea but no vomiting. No fevers or chills. No injuries. No numbness or weakness. Past Medical History:  Diagnosis Date  . Allergy   . Diabetes mellitus   . Frequent headaches   . Glaucoma   . Hypercholesteremia   . Hypertension   . Migraines   . Sleep apnea     Patient Active Problem List   Diagnosis Date Noted  . Type 2 diabetes mellitus without complication, without long-term current use of insulin (HCC) 06/06/2016  . Essential hypertension 06/06/2016  . Gastroesophageal reflux disease without esophagitis 06/06/2016    Past Surgical History:  Procedure Laterality Date  . CARDIAC CATHETERIZATION  12/2011  . LEFT HEART CATHETERIZATION WITH CORONARY ANGIOGRAM N/A 11/12/2011   Procedure: LEFT HEART CATHETERIZATION WITH CORONARY ANGIOGRAM;  Surgeon: Pamella Pert, MD;  Location: [facility] CATH LAB;  Service: Cardiovascular;  Laterality: N/A;  . SHOULDER SURGERY         Home Medications    Prior to Admission medications   Medication Sig Start Date End Date Taking? Authorizing Provider  aspirin 81 MG tablet Take 81 mg by mouth daily.   Yes [provider]  atorvastatin (LIPITOR) 40 MG tablet Take 1 tablet (40 mg total) by mouth daily. 06/06/16  Yes  Veryl Speak, FNP  gabapentin (NEURONTIN) 600 MG tablet Take 600 mg by mouth 2 (two) times daily.  08/08/16  Yes [provider]  glimepiride (AMARYL) 4 MG tablet Take 1 tablet (4 mg total) by mouth at bedtime. Patient taking differently: Take 4 mg by mouth 2 (two) times daily.  07/06/16  Yes Veryl Speak, FNP  losartan-hydrochlorothiazide (HYZAAR) 100-12.5 MG tablet Take 1 tablet by mouth daily. 07/05/16  Yes Veryl Speak, FNP  metFORMIN (GLUCOPHAGE) 1000 MG tablet Take 1 tablet (1,000 mg total) by mouth 2 (two) times daily with a meal. 07/26/16  Yes Veryl Speak, FNP  methocarbamol (ROBAXIN) 500 MG tablet Take 1 tablet (500 mg total) by mouth 2 (two) times daily. Patient taking differently: Take 500 mg by mouth every 6 (six) hours as needed for muscle spasms.  03/17/16  Yes Neese, Hope M, NP  omeprazole (PRILOSEC) 40 MG capsule Take 1 capsule (40 mg total) by mouth daily as needed (indigestion). 06/25/16  Yes Veryl Speak, FNP  timolol (TIMOPTIC-XR) 0.5 % ophthalmic gel-forming Place 1 drop into both eyes daily.  08/10/16  Yes [provider]  verapamil (CALAN) 120 MG tablet Take 120 mg by mouth daily.   Yes [provider]  Blood Glucose Monitoring Suppl (ONETOUCH VERIO) w/Device KIT 1 Device by Does not apply route 2 (two) times daily as needed. 07/31/16   Plotnikov, Georgina Quint, MD  Exenatide ER (BYDUREON) 2 MG PEN Inject 1 pen (2  mg) in skin once a week. 07/20/16   Golden Circle, FNP    Family History Family History  Problem Relation Age of Onset  . Arthritis Mother   . Stroke Mother   . Hypertension Father   . Diabetes Father   . Cancer Father   . Diabetes Maternal Grandfather     Social History Social History  Substance Use Topics  . Smoking status: Never Smoker  . Smokeless tobacco: Never Used  . Alcohol use Yes     Comment: occasional     Allergies   Patient has no known allergies.   Review of Systems Review of Systems  All  other systems reviewed and are negative.    Physical Exam Updated Vital Signs BP (!) 164/98 (BP Location: Left Arm)   Pulse 72   Temp 98.4 F (36.9 C) (Oral)   Resp 12   SpO2 92%   Physical Exam  Constitutional: He appears well-developed and well-nourished. No distress.  HENT:  Head: Normocephalic and atraumatic.  Right Ear: External ear normal.  Left Ear: External ear normal.  Eyes: Conjunctivae are normal. Right eye exhibits no discharge. Left eye exhibits no discharge. No scleral icterus.  Neck: Neck supple. No tracheal deviation present.  Cardiovascular: Normal rate, regular rhythm and intact distal pulses.   Pulmonary/Chest: Effort normal and breath sounds normal. No stridor. No respiratory distress. He has no wheezes. He has no rales.  Abdominal: Soft. Bowel sounds are normal. He exhibits no distension. There is no tenderness. There is no rebound and no guarding.  Musculoskeletal: He exhibits no edema or tenderness.  Neurological: He is alert. He has normal strength. No cranial nerve deficit (no facial droop, extraocular movements intact, no slurred speech) or sensory deficit. He exhibits normal muscle tone. He displays no seizure activity. Coordination normal.  Skin: Skin is warm and dry. No rash noted.  Psychiatric: He has a normal mood and affect.  Nursing note and vitals reviewed.    ED Treatments / Results  Procedures Procedures (including critical care time)  Medications Ordered in ED Medications  ketorolac (TORADOL) 30 MG/ML injection 30 mg (30 mg Intravenous Given 08/16/16 1930)  prochlorperazine (COMPAZINE) injection 10 mg (10 mg Intravenous Given 08/16/16 1930)  diphenhydrAMINE (BENADRYL) injection 12.5 mg (12.5 mg Intravenous Given 08/16/16 1931)     Initial Impression / Assessment and Plan / ED Course  I have reviewed the triage vital signs and the nursing notes.  Pertinent labs & imaging results that were available during my care of the patient were  reviewed by me and considered in my medical decision making (see chart for details).  Clinical Course as of Aug 16 2001  Thu Aug 16, 2016  6606 Patient states his symptoms are consistent with a migraine headache. We'll plan on migraine cocktail treatment and reassess  Hico is improving.  Pt is feeling better now but would like to wait a little longer  [JK]    Clinical Course User Index [JK] Dorie Rank, MD    Sx are consistent with a migraine headache.  No findings to suggest mass, SAH, stroke , meningitis.  At this time there does not appear to be any evidence of an acute emergency medical condition and the patient appears stable for discharge with appropriate outpatient follow up.   Final Clinical Impressions(s) / ED Diagnoses   Final diagnoses:  Migraine without status migrainosus, not intractable, unspecified migraine type  Hypertension, unspecified type    New Prescriptions New  Prescriptions   No medications on file     Dorie Rank, MD 08/16/16 2003

## 2016-08-16 NOTE — Discharge Instructions (Signed)
Continue your current medications, follow up with your primary care doctor or neurologist if her symptoms persist

## 2016-08-16 NOTE — ED Triage Notes (Signed)
Pt complaint of headache with associated nausea onset 0500 today; hx of migraines.

## 2016-09-05 ENCOUNTER — Telehealth: Payer: Self-pay | Admitting: Family

## 2016-09-05 MED ORDER — ASPIRIN 81 MG PO TABS: 81.0000 mg | ORAL_TABLET | Freq: Every day | ORAL | 1 refills | Status: DC

## 2016-09-05 NOTE — Telephone Encounter (Signed)
Reviewed chart pt is up-to-date sent refill to [facility]...Raechel Chute/lmb

## 2016-09-05 NOTE — Telephone Encounter (Signed)
Patient requesting [facility] for 81mg  [facility] [address]

## 2016-09-06 ENCOUNTER — Ambulatory Visit (INDEPENDENT_AMBULATORY_CARE_PROVIDER_SITE_OTHER): Payer: 59 | Admitting: Family

## 2016-09-06 ENCOUNTER — Encounter: Payer: Self-pay | Admitting: Family

## 2016-09-06 ENCOUNTER — Other Ambulatory Visit (INDEPENDENT_AMBULATORY_CARE_PROVIDER_SITE_OTHER): Payer: 59

## 2016-09-06 VITALS — BP 156/86 | HR 89 | Temp 98.5°F | Resp 16 | Ht 68.0 in | Wt 237.0 lb

## 2016-09-06 DIAGNOSIS — E119 Type 2 diabetes mellitus without complications: Secondary | ICD-10-CM

## 2016-09-06 DIAGNOSIS — I10 Essential (primary) hypertension: Secondary | ICD-10-CM | POA: Diagnosis not present

## 2016-09-06 LAB — HEMOGLOBIN A1C: Hgb A1c MFr Bld: 9.3 % — ABNORMAL HIGH (ref 4.6–6.5)

## 2016-09-06 MED ORDER — EXENATIDE ER 2 MG/0.85ML ~~LOC~~ AUIJ: 2.0000 mg | AUTO-INJECTOR | SUBCUTANEOUS | 2 refills | Status: DC

## 2016-09-06 MED ORDER — DAPAGLIFLOZIN PROPANEDIOL 5 MG PO TABS: 5.0000 mg | ORAL_TABLET | Freq: Every day | ORAL | 2 refills | Status: DC

## 2016-09-06 MED ORDER — LOSARTAN POTASSIUM-HCTZ 100-25 MG PO TABS: 1.0000 | ORAL_TABLET | Freq: Every day | ORAL | 0 refills | Status: DC

## 2016-09-06 NOTE — Assessment & Plan Note (Signed)
Obtain hemoglobin A1c. Her blood sugars at home diabetes appears well improved. No new symptoms of end organ damage noted on physical exam. Change Bydureon to B-cise secondary to dexterity concerns. Continue current dosage of metformin, glimepiride and Bydureon. Monitor blood sugars at home 1-2 times per day. Maintained on losartan and atorvastatin for CAD risk reduction. Diabetic eye and foot exam are up-to-date per recommendations. Follow-up in 3 months or sooner pending A1c results.

## 2016-09-06 NOTE — Progress Notes (Signed)
Subjective:    Patient ID: Jason Ortega, male    DOB: [birth year], 51 y.o.   MRN: [id number]  Chief Complaint  Patient presents with  . Follow-up    diabetes     HPI:  Jason Ortega is a 51 y.o. male who  has a past medical history of Allergy; Diabetes mellitus; Frequent headaches; Glaucoma; Hypercholesteremia; Hypertension; Migraines; and Sleep apnea. and presents today for a follow up office visit.    1.) Diabetes - Currently maintained on metformin and glimepiride, and Bydrueon.  Reports taking the medication as prescribed and denies adverse side effects or hypoglycemic readings. Blood sugars at home have been fluctuating which an average of about 130. Denies new symptoms of end organ damage. No excessive hunger, thirst, or urination. Working on a low/carbohydrate modified oral intake through a low carb diet.   Lab Results  Component Value Date   HGBA1C 9.3 (H) 09/06/2016     Lab Results  Component Value Date   CREATININE 1.08 06/06/2016   BUN 11 06/06/2016   NA 140 06/06/2016   K 3.7 06/06/2016   CL 102 06/06/2016   CO2 31 06/06/2016    2.) Hypertension - Currently maintained on losartan-hctz and verapamil. Reports taking the medication as prescribed. No adverse side effects or hypotensive readings. Blood pressures at home have averaged around the 150s.Denies worse headache of life with no new symptoms of end organ damage. Not currently following a low-sodium diet.  BP Readings from Last 3 Encounters:  09/06/16 (!) 156/86  08/16/16 (!) 164/98  06/06/16 (!) 160/90    No Known Allergies    Outpatient Medications Prior to Visit  Medication Sig Dispense Refill  . aspirin 81 MG tablet Take 1 tablet (81 mg total) by mouth daily. 90 tablet 1  . atorvastatin (LIPITOR) 40 MG tablet Take 1 tablet (40 mg total) by mouth daily. 90 tablet 0  . Blood Glucose Monitoring Suppl (ONETOUCH VERIO) w/Device KIT 1 Device by Does not apply route 2 (two) times daily as needed. 1 kit 0    . gabapentin (NEURONTIN) 600 MG tablet Take 600 mg by mouth 2 (two) times daily.     [name] glimepiride (AMARYL) 4 MG tablet Take 1 tablet (4 mg total) by mouth at bedtime. (Patient taking differently: Take 4 mg by mouth 2 (two) times daily. ) 90 tablet 0  . metFORMIN (GLUCOPHAGE) 1000 MG tablet Take 1 tablet (1,000 mg total) by mouth 2 (two) times daily with a meal. 180 tablet 0  . methocarbamol (ROBAXIN) 500 MG tablet Take 1 tablet (500 mg total) by mouth 2 (two) times daily. (Patient taking differently: Take 500 mg by mouth every 6 (six) hours as needed for muscle spasms. ) 20 tablet 0  . omeprazole (PRILOSEC) 40 MG capsule Take 1 capsule (40 mg total) by mouth daily as needed (indigestion). 90 capsule 1  . timolol (TIMOPTIC-XR) 0.5 % ophthalmic gel-forming Place 1 drop into both eyes daily.     . verapamil (CALAN) 120 MG tablet Take 120 mg by mouth daily.    . Exenatide ER (BYDUREON) 2 MG PEN Inject 1 pen (2 mg) in skin once a week. 4 each 2  . losartan-hydrochlorothiazide (HYZAAR) 100-12.5 MG tablet Take 1 tablet by mouth daily. 90 tablet 0   No facility-administered medications prior to visit.       Past Surgical History:  Procedure Laterality Date  . CARDIAC CATHETERIZATION  12/2011  . LEFT HEART CATHETERIZATION WITH CORONARY ANGIOGRAM N/A 11/12/2011  Procedure: LEFT HEART CATHETERIZATION WITH CORONARY ANGIOGRAM;  Surgeon: Laverda Page, MD;  Location: [facility] CATH LAB;  Service: Cardiovascular;  Laterality: N/A;  . SHOULDER SURGERY        Past Medical History:  Diagnosis Date  . Allergy   . Diabetes mellitus   . Frequent headaches   . Glaucoma   . Hypercholesteremia   . Hypertension   . Migraines   . Sleep apnea       Review of Systems  Eyes:       Negative for changes in vision.  Respiratory: Negative for chest tightness and shortness of breath.   Cardiovascular: Negative for chest pain, palpitations and leg swelling.  Endocrine: Negative for polydipsia, polyphagia  and polyuria.  Neurological: Negative for dizziness, weakness, light-headedness and headaches.      Objective:    BP (!) 156/86 (BP Location: Left Arm, Patient Position: Sitting, Cuff Size: Large)   Pulse 89   Temp 98.5 F (36.9 C) (Oral)   Resp 16   Ht '5\' 8"'$  (1.727 m)   Wt 237 lb (107.5 kg)   SpO2 96%   BMI 36.04 kg/m  Nursing note and vital signs reviewed.  Physical Exam  Constitutional: He is oriented to person, place, and time. He appears well-developed and well-nourished. No distress.  Cardiovascular: Normal rate, regular rhythm, normal heart sounds and intact distal pulses.   Pulmonary/Chest: Effort normal and breath sounds normal.  Neurological: He is alert and oriented to person, place, and time.  Skin: Skin is warm and dry.  Psychiatric: He has a normal mood and affect. His behavior is normal. Judgment and thought content normal.       Assessment & Plan:   Problem List Items Addressed This Visit      Cardiovascular and Mediastinum   Essential hypertension    Blood pressure remains elevated above goal 140/90 with current medication regimen and no adverse side effects. Increase losartan-hydrochlorothiazide. Encouraged to monitor blood pressure at home and follow low-sodium diet.      Relevant Medications   losartan-hydrochlorothiazide (HYZAAR) 100-25 MG tablet     Endocrine   Type 2 diabetes mellitus without complication, without long-term current use of insulin (HCC) - Primary    Obtain hemoglobin A1c. Her blood sugars at home diabetes appears well improved. No new symptoms of end organ damage noted on physical exam. Change Bydureon to B-cise secondary to dexterity concerns. Continue current dosage of metformin, glimepiride and Bydureon. Monitor blood sugars at home 1-2 times per day. Maintained on losartan and atorvastatin for CAD risk reduction. Diabetic eye and foot exam are up-to-date per recommendations. Follow-up in 3 months or sooner pending A1c results.       Relevant Medications   Exenatide ER (BYDUREON BCISE) 2 MG/0.85ML AUIJ   losartan-hydrochlorothiazide (HYZAAR) 100-25 MG tablet   Other Relevant Orders   Hemoglobin A1c (Completed)       I have discontinued Mr. Schoffstall losartan-hydrochlorothiazide and Exenatide ER. I am also having him start on Exenatide ER and losartan-hydrochlorothiazide. Additionally, I am having him maintain his verapamil, methocarbamol, atorvastatin, omeprazole, glimepiride, metFORMIN, ONETOUCH VERIO, gabapentin, timolol, and aspirin.   Meds ordered this encounter  Medications  . Exenatide ER (BYDUREON BCISE) 2 MG/0.85ML AUIJ    Sig: Inject 2 mg into the skin once a week.    Dispense:  4 pen    Refill:  2    Has dexterity issues with normal Bydureon pen.    Order Specific Question:   Supervising Provider  Answer:   Pricilla Holm A [[id number]]  . losartan-hydrochlorothiazide (HYZAAR) 100-25 MG tablet    Sig: Take 1 tablet by mouth daily.    Dispense:  90 tablet    Refill:  0    Order Specific Question:   Supervising Provider    Answer:   Pricilla Holm A [[id number]]     Follow-up: Return in about 3 months (around 12/07/2016), or if symptoms worsen or fail to improve.  Mauricio Po, FNP

## 2016-09-06 NOTE — Assessment & Plan Note (Signed)
Blood pressure remains elevated above goal 140/90 with current medication regimen and no adverse side effects. Increase losartan-hydrochlorothiazide. Encouraged to monitor blood pressure at home and follow low-sodium diet.

## 2016-09-06 NOTE — Patient Instructions (Signed)
Thank you for choosing [facility].  SUMMARY AND INSTRUCTIONS:  Please continue to take your medications as prescribed.  We have increased your losartan-hctz and changed your Bydureon to the B-cise.   Monitor your blood pressure and blood sugars at home.   Medication:  Your prescription(s) have been submitted to your pharmacy or been printed and provided for you. Please take as directed and contact our office if you believe you are having problem(s) with the medication(s) or have any questions.  Labs:  Please stop by the lab on the lower level of the building for your blood work. Your results will be released to MyChart (or called to you) after review, usually within 72 hours after test completion. If any changes need to be made, you will be notified at that same time.  1.) The lab is open from 7:30am to 5:30 pm Monday-Friday 2.) No appointment is necessary 3.) Fasting (if needed) is 6-8 hours after food and drink; black coffee and water are okay   Follow up:  If your symptoms worsen or fail to improve, please contact our office for further instruction, or in case of emergency go directly to the emergency room at the closest medical facility.

## 2016-09-15 ENCOUNTER — Other Ambulatory Visit: Payer: Self-pay | Admitting: Family

## 2016-12-04 DIAGNOSIS — G5603 Carpal tunnel syndrome, bilateral upper limbs: Secondary | ICD-10-CM | POA: Insufficient documentation

## 2016-12-07 ENCOUNTER — Other Ambulatory Visit (INDEPENDENT_AMBULATORY_CARE_PROVIDER_SITE_OTHER): Payer: 59

## 2016-12-07 ENCOUNTER — Ambulatory Visit: Payer: 59 | Admitting: Family

## 2016-12-07 ENCOUNTER — Encounter: Payer: Self-pay | Admitting: Nurse Practitioner

## 2016-12-07 ENCOUNTER — Ambulatory Visit (INDEPENDENT_AMBULATORY_CARE_PROVIDER_SITE_OTHER): Payer: 59 | Admitting: Nurse Practitioner

## 2016-12-07 VITALS — BP 158/88 | HR 84 | Temp 97.9°F | Resp 16 | Ht 68.0 in | Wt 232.0 lb

## 2016-12-07 DIAGNOSIS — I10 Essential (primary) hypertension: Secondary | ICD-10-CM

## 2016-12-07 DIAGNOSIS — Z23 Encounter for immunization: Secondary | ICD-10-CM

## 2016-12-07 DIAGNOSIS — E119 Type 2 diabetes mellitus without complications: Secondary | ICD-10-CM

## 2016-12-07 DIAGNOSIS — M5441 Lumbago with sciatica, right side: Secondary | ICD-10-CM

## 2016-12-07 DIAGNOSIS — M5442 Lumbago with sciatica, left side: Secondary | ICD-10-CM | POA: Diagnosis not present

## 2016-12-07 DIAGNOSIS — G8929 Other chronic pain: Secondary | ICD-10-CM | POA: Diagnosis not present

## 2016-12-07 LAB — COMPREHENSIVE METABOLIC PANEL
ALT: 27 U/L (ref 0–53)
AST: 22 U/L (ref 0–37)
Albumin: 4.3 g/dL (ref 3.5–5.2)
Alkaline Phosphatase: 63 U/L (ref 39–117)
BILIRUBIN TOTAL: 0.3 mg/dL (ref 0.2–1.2)
BUN: 11 mg/dL (ref 6–23)
CALCIUM: 9.5 mg/dL (ref 8.4–10.5)
CO2: 33 meq/L — AB (ref 19–32)
Chloride: 104 mEq/L (ref 96–112)
Creatinine, Ser: 1.09 mg/dL (ref 0.40–1.50)
GFR: 90.96 mL/min (ref >60.00)
Glucose, Bld: 104 mg/dL — ABNORMAL HIGH (ref 70–99)
POTASSIUM: 3.7 meq/L (ref 3.5–5.1)
Sodium: 141 mEq/L (ref 135–145)
Total Protein: 8 g/dL (ref 6.0–8.3)

## 2016-12-07 LAB — HEMOGLOBIN A1C: Hgb A1c MFr Bld: 7.2 % — ABNORMAL HIGH (ref 4.6–6.5)

## 2016-12-07 MED ORDER — METHOCARBAMOL 500 MG PO TABS: 500.0000 mg | ORAL_TABLET | Freq: Two times a day (BID) | ORAL | 0 refills | Status: DC

## 2016-12-07 MED ORDER — LOSARTAN POTASSIUM-HCTZ 100-25 MG PO TABS: 1.0000 | ORAL_TABLET | Freq: Every day | ORAL | 0 refills | Status: DC

## 2016-12-07 MED ORDER — OMEPRAZOLE 40 MG PO CPDR: 40.0000 mg | DELAYED_RELEASE_CAPSULE | Freq: Every day | ORAL | 1 refills | Status: DC | PRN

## 2016-12-07 NOTE — Progress Notes (Signed)
Subjective:    Patient ID: Jason Ortega, male    DOB: 1965/03/03, 51 y.o.   MRN: [id number]  HPI Jason Ortega is a 51yo male who presents today to establish care. He Is transferring to me from another provider in the same clinic.  Hypertension- Maintained on verapamil, hyzaar. Ran out of his hyzaar on Tuesday. He does notice some mild swelling in his legs at times. He denies headaches, vision changes, chest pain. He does not check his blood pressure at home, but does check at drug store about once a month.  BP Readings from Last 3 Encounters:  12/07/16 (!) 158/88  09/06/16 (!) 156/86  08/16/16 (!) 164/98   Diabetes- Maintained on metformin, glimepiride, bydureon pen, farxiga. He checks his blood sugars daily when he wakes for work, 3rd shift, around 2-3pm. Recent readings range between 70's to lower 200s. He did run out of farxiga, bydureon for some time but did refill it this week. He plans to resume all medications as prescribed. He's had some tingling in hands and feet over past several weeks. He denies any adverse medication effects, hypoglycemia.  Back pain- intermittent over past year. Describes as spasms in lower back. Radiates into bilateral legs at times. Denies any weakness, numbness, bowel or bladder dysfunction. Reports heavy lifting at his job on daily basis. He's taken robaxin in the past with relief.  Review of Systems  See HPI  Past Medical History:  Diagnosis Date  . Allergy   . Diabetes mellitus   . Frequent headaches   . Glaucoma   . Hypercholesteremia   . Hypertension   . Migraines   . Sleep apnea      Social History   Social History  . Marital status: Married    Spouse name: N/A  . Number of children: 2  . Years of education: 13   Occupational History  . [employer]    Social History Main Topics  . Smoking status: Never Smoker  . Smokeless tobacco: Never Used  . Alcohol use Yes     Comment: occasional  . Drug use: No  . Sexual activity: Yes    Birth  control/ protection: None   Other Topics Concern  . Not on file   Social History Narrative   Fun/Hobby: Coach football       Past Surgical History:  Procedure Laterality Date  . CARDIAC CATHETERIZATION  12/2011  . LEFT HEART CATHETERIZATION WITH CORONARY ANGIOGRAM N/A 11/12/2011   Procedure: LEFT HEART CATHETERIZATION WITH CORONARY ANGIOGRAM;  Surgeon: Laverda Page, MD;  Location: [facility] CATH LAB;  Service: Cardiovascular;  Laterality: N/A;  . SHOULDER SURGERY      Family History  Problem Relation Age of Onset  . Arthritis Mother   . Stroke Mother   . Hypertension Father   . Diabetes Father   . Cancer Father   . Diabetes Maternal Grandfather     No Known Allergies  Current Outpatient Prescriptions on File Prior to Visit  Medication Sig Dispense Refill  . aspirin 81 MG tablet Take 1 tablet (81 mg total) by mouth daily. 90 tablet 1  . atorvastatin (LIPITOR) 40 MG tablet TAKE 1 TABLET(40 MG) BY MOUTH DAILY 30 tablet 0  . Blood Glucose Monitoring Suppl (ONETOUCH VERIO) w/Device KIT 1 Device by Does not apply route 2 (two) times daily as needed. 1 kit 0  . dapagliflozin propanediol (FARXIGA) 5 MG TABS tablet Take 5 mg by mouth daily. 30 tablet 2  . Exenatide ER (BYDUREON  BCISE) 2 MG/0.85ML AUIJ Inject 2 mg into the skin once a week. 4 pen 2  . gabapentin (NEURONTIN) 600 MG tablet Take 600 mg by mouth 2 (two) times daily.     [name] glimepiride (AMARYL) 4 MG tablet Take 1 tablet (4 mg total) by mouth at bedtime. (Patient taking differently: Take 4 mg by mouth 2 (two) times daily. ) 90 tablet 0  . losartan-hydrochlorothiazide (HYZAAR) 100-25 MG tablet Take 1 tablet by mouth daily. 90 tablet 0  . metFORMIN (GLUCOPHAGE) 1000 MG tablet Take 1 tablet (1,000 mg total) by mouth 2 (two) times daily with a meal. 180 tablet 0  . methocarbamol (ROBAXIN) 500 MG tablet Take 1 tablet (500 mg total) by mouth 2 (two) times daily. (Patient taking differently: Take 500 mg by mouth every 6 (six) hours as  needed for muscle spasms. ) 20 tablet 0  . omeprazole (PRILOSEC) 40 MG capsule Take 1 capsule (40 mg total) by mouth daily as needed (indigestion). 90 capsule 1  . timolol (TIMOPTIC-XR) 0.5 % ophthalmic gel-forming Place 1 drop into both eyes daily.     . verapamil (CALAN) 120 MG tablet Take 120 mg by mouth daily.     No current facility-administered medications on file prior to visit.     BP (!) 158/88 (BP Location: Left Arm, Patient Position: Sitting, Cuff Size: Large)   Pulse 84   Temp 97.9 F (36.6 C) (Oral)   Resp 16   Ht 5' 8" (1.727 m)   Wt 232 lb (105.2 kg)   SpO2 97%   BMI 35.28 kg/m       Objective:   Physical Exam  Constitutional: He is oriented to person, place, and time. He appears well-developed and well-nourished. No distress.  Cardiovascular: Normal rate, regular rhythm, normal heart sounds and intact distal pulses.   Pulmonary/Chest: Effort normal and breath sounds normal.  Musculoskeletal: Normal range of motion. He exhibits no edema, tenderness or deformity.  Neurological: He is alert and oriented to person, place, and time. Coordination normal.  Skin: Skin is warm and dry.  Psychiatric: He has a normal mood and affect. Judgment and thought content normal.      Assessment & Plan:  Flu shot given today.

## 2016-12-07 NOTE — Assessment & Plan Note (Addendum)
Maintained on metformin, glimepiride, bydureon pen, farxiga. Ran out of farxiga, bydureon for about 1 week but did get refilled this week. A1c ordered. He will resume current medication regimen as directed and follow up in 1 month for diabetes recheck.

## 2016-12-07 NOTE — Patient Instructions (Addendum)
Please head downstairs for lab work.  I have sent refills of your medications as requested.  Id like for you to return in 1 month for a recheck of your blood sugar and blood pressure. Please try to take your medications daily as prescribed.  Please work on [employer] Holdingsyour diet as we discussed. Remember half of your plate should be veggies, one-fourth carbs, one-fourth meat, and don't eat meat at every meal. Also, remember to stay away from sugary drinks.  It was nice to meet you. Thanks for letting me take care of you today :)

## 2016-12-07 NOTE — Assessment & Plan Note (Addendum)
Pain is intermittent, usually related to heavy lifting at work. Some radiation into BLE. He denies any numbness, weakness, bowel or bladder incontinence.normal MSK exam. He has had a prescription for robaxin prn in the past which helped. Will refill. He will let me know for continued or worsening pain to consider further diagnostic testing/referral. Discussed proper body mechanics for heavy lifiting.

## 2016-12-07 NOTE — Assessment & Plan Note (Addendum)
Blood pressure remains elevated above goal 140/90 with current medication regimen- verapamil, hyzaar. He did run out of hyzaar last week but plans to resume today. Hes noticed some mild swelling in his lower extremities this week. He denies headaches, vision changes, chest pain, shortness of breath. Encouraged healthy diet. CMET ordered. He will continue current dosages of medications and daily compliance and return in 1 month for blood pressure follow up. Encouraged to let provider know of any medication refills he needs.

## 2016-12-07 NOTE — Addendum Note (Signed)
Addended by: Mercer PodWRENN, Youssef Footman E on: 12/07/2016 01:35 PM   Modules accepted: Orders

## 2016-12-24 ENCOUNTER — Other Ambulatory Visit: Payer: Self-pay

## 2016-12-24 MED ORDER — GLIMEPIRIDE 4 MG PO TABS: 4.0000 mg | ORAL_TABLET | Freq: Two times a day (BID) | ORAL | 0 refills | Status: DC

## 2016-12-24 MED ORDER — ATORVASTATIN CALCIUM 40 MG PO TABS: ORAL_TABLET | ORAL | 0 refills | Status: DC

## 2017-01-01 ENCOUNTER — Other Ambulatory Visit: Payer: Self-pay | Admitting: *Deleted

## 2017-01-01 MED ORDER — EXENATIDE ER 2 MG/0.85ML ~~LOC~~ AUIJ: 2.0000 mg | AUTO-INJECTOR | SUBCUTANEOUS | 0 refills | Status: DC

## 2017-01-09 ENCOUNTER — Ambulatory Visit (INDEPENDENT_AMBULATORY_CARE_PROVIDER_SITE_OTHER): Payer: 59 | Admitting: Nurse Practitioner

## 2017-01-09 ENCOUNTER — Encounter: Payer: Self-pay | Admitting: Nurse Practitioner

## 2017-01-09 VITALS — BP 150/84 | HR 81 | Temp 97.9°F | Resp 16 | Ht 68.0 in | Wt 229.0 lb

## 2017-01-09 DIAGNOSIS — E119 Type 2 diabetes mellitus without complications: Secondary | ICD-10-CM | POA: Diagnosis not present

## 2017-01-09 DIAGNOSIS — J40 Bronchitis, not specified as acute or chronic: Secondary | ICD-10-CM | POA: Diagnosis not present

## 2017-01-09 DIAGNOSIS — I10 Essential (primary) hypertension: Secondary | ICD-10-CM | POA: Diagnosis not present

## 2017-01-09 MED ORDER — AZITHROMYCIN 250 MG PO TABS: ORAL_TABLET | ORAL | 0 refills | Status: DC

## 2017-01-09 NOTE — Patient Instructions (Signed)
I have placed a referral to endocrinology to help manage your diabetes. I requested Dr Everardo AllEllison. Our office will call you to schedule this appointment. You should hear from our office in 7-10 days.  Please take your blood pressure medications daily and return in 1 month for follow up. Please try to check your blood pressure once daily or at least a few times a week, at the same time each day, and keep a log. Bring your log with you when you return.  I have sent a z-pak for your cough/cold. Please follow up if symptoms are not better by Friday, if you start feeling worse, or if you develop fevers over 101.   Acute Bronchitis, Adult Acute bronchitis is when air tubes (bronchi) in the lungs suddenly get swollen. The condition can make it hard to breathe. It can also cause these symptoms:  A cough.  Coughing up clear, yellow, or green mucus.  Wheezing.  Chest congestion.  Shortness of breath.  A fever.  Body aches.  Chills.  A sore throat.  Follow these instructions at home: Medicines  Take over-the-counter and prescription medicines only as told by your doctor.  If you were prescribed an antibiotic medicine, take it as told by your doctor. Do not stop taking the antibiotic even if you start to feel better. General instructions  Rest.  Drink enough fluids to keep your pee (urine) clear or pale yellow.  Avoid smoking and secondhand smoke. If you smoke and you need help quitting, ask your doctor. Quitting will help your lungs heal faster.  Use an inhaler, cool mist vaporizer, or humidifier as told by your doctor.  Keep all follow-up visits as told by your doctor. This is important. How is this prevented? To lower your risk of getting this condition again:  Wash your hands often with soap and water. If you cannot use soap and water, use hand sanitizer.  Avoid contact with people who have cold symptoms.  Try not to touch your hands to your mouth, nose, or eyes.  Make sure  to get the flu shot every year.  Contact a doctor if:  Your symptoms do not get better in 2 weeks. Get help right away if:  You cough up blood.  You have chest pain.  You have very bad shortness of breath.  You become dehydrated.  You faint (pass out) or keep feeling like you are going to pass out.  You keep throwing up (vomiting).  You have a very bad headache.  Your fever or chills gets worse. This information is not intended to replace advice given to you by your health care provider. Make sure you discuss any questions you have with your health care provider. Document Released: 07/11/2007 Document Revised: 08/31/2015 Document Reviewed: 07/13/2015 Elsevier Interactive Patient Education  2017 [employer]

## 2017-01-09 NOTE — Progress Notes (Signed)
Subjective:    Patient ID: Jason Ortega, male    DOB: April 30, 1965, 51 y.o.   MRN: [id number]  HPI Mr Grunder is a 51 yo male who presents for a diabetes and blood pressure follow up visit today. He also c/o "Cold symptoms."  Diabetes- maintained on farxiga, bydureon, glimepiride, metformin. During his last visit, he had run out of his farxiga, bydureon until the week of his appointment. He was going to resume compliance with all four medications and return in 1 month for follow up. Reports he has tried to be compliant with his medications for past month but maintaining 4 medications daily is difficult. He denies any diaphoresis, shakiness, polyuria, polydipsia, polyphagia.  Lab Results  Component Value Date   HGBA1C 7.2 (H) 12/07/2016   Hypertension- maintained on verapamil, losartan-hctz. During his last visit, he had run out of his hyzaar. The hyzaar was reordered and he planned to resume both BP medications daily and return to clinic in 1 month for recheck. He states he did pick up his refill but has continued to have trouble remembering to take both medications as prescribed. He works about 70 hours per week and he just does not have a lot of time to think about his medications. He Does not check his blood pressure routinely at home. He denies headaches, vision changes, chest pain, shortness of breath.   BP Readings from Last 3 Encounters:  01/09/17 (!) 150/84  12/07/16 (!) 158/88  09/06/16 (!) 156/86    Cold symptoms-This is an acute problem. This problem began about * days ago. This problem is not improving. He reports chills, head, nasal and chest congestion, cough with yellow sputum. He states he just does not feel well. he's treid mucinex, alka seltzer cold with no improvement. Members in household have been sick with colds.  this problem began about 1 week ago.  He denies high fevers, headaches, facial pain or pressure, sore throat, hemoptysis, nausea, vomtiing.  Review of Systems    See HPI  Past Medical History:  Diagnosis Date  . Allergy   . Diabetes mellitus   . Frequent headaches   . Glaucoma   . Hypercholesteremia   . Hypertension   . Migraines   . Sleep apnea      Social History   Socioeconomic History  . Marital status: Married    Spouse name: Not on file  . Number of children: 2  . Years of education: 77  . Highest education level: Not on file  Social Needs  . Financial resource strain: Not on file  . Food insecurity - worry: Not on file  . Food insecurity - inability: Not on file  . Transportation needs - medical: Not on file  . Transportation needs - non-medical: Not on file  Occupational History  . Occupation: Stocker  Tobacco Use  . Smoking status: Never Smoker  . Smokeless tobacco: Never Used  Substance and Sexual Activity  . Alcohol use: Yes    Comment: occasional  . Drug use: No  . Sexual activity: Yes    Birth control/protection: None  Other Topics Concern  . Not on file  Social History Narrative   Fun/Hobby: Coach football    Past Surgical History:  Procedure Laterality Date  . CARDIAC CATHETERIZATION  12/2011  . LEFT HEART CATHETERIZATION WITH CORONARY ANGIOGRAM N/A 11/12/2011   Procedure: LEFT HEART CATHETERIZATION WITH CORONARY ANGIOGRAM;  Surgeon: Laverda Page, MD;  Location: [facility] CATH LAB;  Service: Cardiovascular;  Laterality: N/A;  .  SHOULDER SURGERY      Family History  Problem Relation Age of Onset  . Arthritis Mother   . Stroke Mother   . Hypertension Father   . Diabetes Father   . Cancer Father   . Diabetes Maternal Grandfather     No Known Allergies  Current Outpatient Medications on File Prior to Visit  Medication Sig Dispense Refill  . aspirin 81 MG tablet Take 1 tablet (81 mg total) by mouth daily. 90 tablet 1  . atorvastatin (LIPITOR) 40 MG tablet TAKE 1 TABLET(40 MG) BY MOUTH DAILY 90 tablet 0  . Blood Glucose Monitoring Suppl (ONETOUCH VERIO) w/Device KIT 1 Device by Does not apply route  2 (two) times daily as needed. 1 kit 0  . dapagliflozin propanediol (FARXIGA) 5 MG TABS tablet Take 5 mg by mouth daily. 30 tablet 2  . Exenatide ER (BYDUREON BCISE) 2 MG/0.85ML AUIJ Inject 2 mg into the skin once a week. Follow-up appt is due must see provider for refills 4 pen 0  . gabapentin (NEURONTIN) 600 MG tablet Take 600 mg by mouth 2 (two) times daily.    [name] glimepiride (AMARYL) 4 MG tablet Take 1 tablet (4 mg total) 2 (two) times daily by mouth. 180 tablet 0  . losartan-hydrochlorothiazide (HYZAAR) 100-25 MG tablet Take 1 tablet by mouth daily. 90 tablet 0  . metFORMIN (GLUCOPHAGE) 1000 MG tablet Take 1 tablet (1,000 mg total) by mouth 2 (two) times daily with a meal. 180 tablet 0  . methocarbamol (ROBAXIN) 500 MG tablet Take 1 tablet (500 mg total) by mouth 2 (two) times daily. 20 tablet 0  . omeprazole (PRILOSEC) 40 MG capsule Take 1 capsule (40 mg total) by mouth daily as needed (indigestion). 90 capsule 1  . timolol (TIMOPTIC-XR) 0.5 % ophthalmic gel-forming Place 1 drop into both eyes daily.     [name] topiramate (TOPAMAX) 25 MG capsule Take 25 mg by mouth 2 (two) times daily.    . verapamil (CALAN) 120 MG tablet Take 120 mg by mouth daily.     No current facility-administered medications on file prior to visit.     BP (!) 150/84 (BP Location: Left Arm, Patient Position: Sitting, Cuff Size: Large)   Pulse 81   Temp 97.9 F (36.6 C) (Oral)   Resp 16   Ht 5' 8" (1.727 m)   Wt 229 lb (103.9 kg)   SpO2 97%   BMI 34.82 kg/m       Objective:   Physical Exam  Constitutional: He is oriented to person, place, and time. He appears well-developed and well-nourished. No distress.  HENT:  Head: Normocephalic and atraumatic.  Right Ear: Tympanic membrane, external ear and ear canal normal. No drainage.  Left Ear: Tympanic membrane, external ear and ear canal normal. No drainage.  Nose: Right sinus exhibits no maxillary sinus tenderness and no frontal sinus tenderness. Left sinus  exhibits no maxillary sinus tenderness and no frontal sinus tenderness.  Mouth/Throat: Uvula is midline, oropharynx is clear and moist and mucous membranes are normal.  Eyes: Conjunctivae are normal.  Cardiovascular: Normal rate, regular rhythm and intact distal pulses.  Pulmonary/Chest: Effort normal and breath sounds normal.  Musculoskeletal: He exhibits no edema.  Neurological: He is alert and oriented to person, place, and time. Coordination normal.  Skin: Skin is warm and dry.  Psychiatric: He has a normal mood and affect. Judgment and thought content normal.      Assessment & Plan:   Bronchitis Symptoms for 8  days including malaise, chills, productive cough. Will treat with abx. - azithromycin (ZITHROMAX) 250 MG tablet; 2 tablets on day 1, then 1 tablet every day for 4 days.  Dispense: 6 tablet; Refill: 0 Return precautions given.

## 2017-01-09 NOTE — Assessment & Plan Note (Signed)
BP remains elevated above goal 140/90 today. Denies any headaches, vision changes, chest pain, shortness of breath. He admits he has not been compliant with medications. Barriers to compliance discussed- he says he just does not take his medications as prescribed due to lack of time and too many prescriptions. He is able to afford the medications and obtain refills. We discussed importance of taking medications daily to achieve goal of <140/90. We discussed healthy diet and exercise as well. Referral to endocrinology placed today to hopefully minimize diabetes regimen. He is motivated to try taking BP medications daily as prescribed and will RTC in 1 month for rechck and hopefully bring a BP log of home readings with him

## 2017-01-09 NOTE — Assessment & Plan Note (Signed)
Maintained on farxiga, bydureon, glimepiride, metformin. Verbalizes difficulty maintaining his medications today. Hed like to try to get down to 1 or 2 medications Referral to endocrinology placed- to help us simplify his diabetes regimen if possible Importance of diet and exercise discussed

## 2017-01-24 ENCOUNTER — Telehealth: Payer: Self-pay | Admitting: Nurse Practitioner

## 2017-01-24 DIAGNOSIS — G43119 Migraine with aura, intractable, without status migrainosus: Secondary | ICD-10-CM

## 2017-01-24 NOTE — Telephone Encounter (Signed)
Copied from CRM [id number]. Topic: Inquiry >> Jan 24, 2017  4:31 PM [name], Jason B wrote: Reason for CRM: pt called b/c [facility] Dr. Daisy BlossomAhearn, contact pt to advise on his steps to take

## 2017-01-25 NOTE — Telephone Encounter (Signed)
LVM for pt asking what the referral was needed for. Asked pt to call back in regards.

## 2017-01-25 NOTE — Telephone Encounter (Signed)
Referral placed. Pt aware. 

## 2017-01-30 ENCOUNTER — Other Ambulatory Visit: Payer: Self-pay | Admitting: *Deleted

## 2017-01-30 MED ORDER — EXENATIDE ER 2 MG/0.85ML ~~LOC~~ AUIJ: 2.0000 mg | AUTO-INJECTOR | SUBCUTANEOUS | 1 refills | Status: DC

## 2017-02-07 ENCOUNTER — Other Ambulatory Visit: Payer: Self-pay

## 2017-02-07 ENCOUNTER — Encounter: Payer: Self-pay | Admitting: Endocrinology

## 2017-02-07 ENCOUNTER — Ambulatory Visit (INDEPENDENT_AMBULATORY_CARE_PROVIDER_SITE_OTHER): Payer: 59 | Admitting: Endocrinology

## 2017-02-07 VITALS — BP 162/98 | HR 73 | Wt 231.0 lb

## 2017-02-07 DIAGNOSIS — R609 Edema, unspecified: Secondary | ICD-10-CM

## 2017-02-07 DIAGNOSIS — E119 Type 2 diabetes mellitus without complications: Secondary | ICD-10-CM | POA: Diagnosis not present

## 2017-02-07 LAB — POCT GLYCOSYLATED HEMOGLOBIN (HGB A1C): Hemoglobin A1C: 7.9

## 2017-02-07 MED ORDER — INSULIN DEGLUDEC 100 UNIT/ML ~~LOC~~ SOPN: 10.0000 [IU] | PEN_INJECTOR | Freq: Every day | SUBCUTANEOUS | 11 refills | Status: DC

## 2017-02-07 NOTE — Patient Instructions (Signed)
good diet and exercise significantly improve the control of your diabetes.  please let me know if you wish to be referred to a dietician.  high blood sugar is very risky to your health.  you should see an eye doctor and dentist every year.  It is very important to get all recommended vaccinations.  Controlling your blood pressure and cholesterol drastically reduces the damage diabetes does to your body.  Those who smoke should quit.  Please discuss these with your doctor.  check your blood sugar once a day.  vary the time of day when you check, between before the 3 meals, and at bedtime.  also check if you have symptoms of your blood sugar being too high or too low.  please keep a record of the readings and bring it to your next appointment here (or you can bring the meter itself).  You can write it on any piece of paper.  please call us sooner if your blood sugar goes below 70, or if you have a lot of readings over 200. I have sent a prescription to your pharmacy, to start tresiba.  This replaces the glimepiride. Please continue the same other diabetes medications. Please call or message us next week, to tell us how the blood sugar is doing. Our goal will be to stop the non-insulin meds, 1 at a time. Please come back for a follow-up appointment in 2 months.

## 2017-02-07 NOTE — Progress Notes (Signed)
Subjective:    Patient ID: Jason Ortega, male    DOB: 09-05-1965, 52 y.o.   MRN: [id number]  HPI pt is referred by Brien Few, NP, for diabetes.  Pt states DM was dx'ed in 1999; he has mild if any neuropathy of the lower extremities; he is unaware of any associated chronic complications; he has never been on insulin; pt says his diet and exercise are fair; he has never had pancreatitis, pancreatic surgery, severe hypoglycemia or DKA.  She takes Bydureon and 3 oral meds.  He denies hypoglycemia.  He works in a warehouse, variable shifts.   Past Medical History:  Diagnosis Date  . Allergy   . Diabetes mellitus   . Frequent headaches   . Glaucoma   . Hypercholesteremia   . Hypertension   . Migraines   . Sleep apnea     Past Surgical History:  Procedure Laterality Date  . CARDIAC CATHETERIZATION  12/2011  . LEFT HEART CATHETERIZATION WITH CORONARY ANGIOGRAM N/A 11/12/2011   Procedure: LEFT HEART CATHETERIZATION WITH CORONARY ANGIOGRAM;  Surgeon: Laverda Page, MD;  Location: [facility] CATH LAB;  Service: Cardiovascular;  Laterality: N/A;  . SHOULDER SURGERY      Social History   Socioeconomic History  . Marital status: Married    Spouse name: Not on file  . Number of children: 2  . Years of education: 12  . Highest education level: Not on file  Social Needs  . Financial resource strain: Not on file  . Food insecurity - worry: Not on file  . Food insecurity - inability: Not on file  . Transportation needs - medical: Not on file  . Transportation needs - non-medical: Not on file  Occupational History  . Occupation: Stocker  Tobacco Use  . Smoking status: Never Smoker  . Smokeless tobacco: Never Used  Substance and Sexual Activity  . Alcohol use: Yes    Comment: occasional  . Drug use: No  . Sexual activity: Yes    Birth control/protection: None  Other Topics Concern  . Not on file  Social History Narrative   Fun/Hobby: Coach football    Current Outpatient  Medications on File Prior to Visit  Medication Sig Dispense Refill  . aspirin 81 MG tablet Take 1 tablet (81 mg total) by mouth daily. 90 tablet 1  . atorvastatin (LIPITOR) 40 MG tablet TAKE 1 TABLET(40 MG) BY MOUTH DAILY 90 tablet 0  . Blood Glucose Monitoring Suppl (ONETOUCH VERIO) w/Device KIT 1 Device by Does not apply route 2 (two) times daily as needed. 1 kit 0  . dapagliflozin propanediol (FARXIGA) 5 MG TABS tablet Take 5 mg by mouth daily. 30 tablet 2  . Exenatide ER (BYDUREON BCISE) 2 MG/0.85ML AUIJ Inject 2 mg into the skin once a week. 4 pen 1  . gabapentin (NEURONTIN) 600 MG tablet Take 600 mg by mouth 2 (two) times daily.    [name] losartan-hydrochlorothiazide (HYZAAR) 100-25 MG tablet Take 1 tablet by mouth daily. 90 tablet 0  . metFORMIN (GLUCOPHAGE) 1000 MG tablet Take 1 tablet (1,000 mg total) by mouth 2 (two) times daily with a meal. 180 tablet 0  . methocarbamol (ROBAXIN) 500 MG tablet Take 1 tablet (500 mg total) by mouth 2 (two) times daily. 20 tablet 0  . omeprazole (PRILOSEC) 40 MG capsule Take 1 capsule (40 mg total) by mouth daily as needed (indigestion). 90 capsule 1  . timolol (TIMOPTIC-XR) 0.5 % ophthalmic gel-forming Place 1 drop into both eyes daily.     [name]  topiramate (TOPAMAX) 25 MG capsule Take 25 mg by mouth 2 (two) times daily.    . verapamil (CALAN) 120 MG tablet Take 120 mg by mouth daily.     No current facility-administered medications on file prior to visit.     No Known Allergies  Family History  Problem Relation Age of Onset  . Arthritis Mother   . Stroke Mother   . Hypertension Father   . Diabetes Father   . Cancer Father   . Diabetes Maternal Grandfather     BP (!) 162/98 (BP Location: Left Arm, Patient Position: Sitting, Cuff Size: Normal)   Pulse 73   Wt 231 lb (104.8 kg)   SpO2 99%   BMI 35.12 kg/m     Review of Systems denies blurry vision, chest pain, sob, n/v, muscle cramps, excessive diaphoresis, depression, cold intolerance,  rhinorrhea, and easy bruising.  He has lost a few lbs, due to his efforts.  He has intermitt headache, and frequent urination.      Objective:   Physical Exam VS: see vs page GEN: no distress HEAD: head: no deformity eyes: no periorbital swelling, no proptosis external nose and ears are normal mouth: no lesion seen NECK: supple, thyroid is not enlarged CHEST WALL: no deformity LUNGS: clear to auscultation CV: reg rate and rhythm, no murmur ABD: abdomen is soft, nontender.  no hepatosplenomegaly.  not distended.  no hernia MUSCULOSKELETAL: muscle bulk and strength are grossly normal.  no obvious joint swelling.  gait is normal and steady EXTEMITIES: no deformity.  no ulcer on the feet.  feet are of normal color and temp.  Trace bilat leg edema PULSES: dorsalis pedis intact bilat.  no carotid bruit NEURO:  cn 2-12 grossly intact.   readily moves all 4's.  sensation is intact to touch on the feet SKIN:  Normal texture and temperature.  No rash or suspicious lesion is visible.   NODES:  None palpable at the neck PSYCH: alert, well-oriented.  Does not appear anxious nor depressed.     Lab Results  Component Value Date   HGBA1C 7.9 02/07/2017   Lab Results  Component Value Date   CREATININE 1.09 12/07/2016   BUN 11 12/07/2016   NA 141 12/07/2016   K 3.7 12/07/2016   CL 104 12/07/2016   CO2 33 (H) 12/07/2016   I have reviewed outside records, and summarized: Pt was noted to have elevated a1c, and referred here.  He was noted to ne intermittently compliant with meds.      Assessment & Plan:  Type 2 DM, new to me: he needs increased rx.  We discussed options.  He wants to start tresiba.  Edema: this limits rx options.  Patient Instructions  good diet and exercise significantly improve the control of your diabetes.  please let me know if you wish to be referred to a dietician.  high blood sugar is very risky to your health.  you should see an eye doctor and dentist every year.  It  is very important to get all recommended vaccinations.  Controlling your blood pressure and cholesterol drastically reduces the damage diabetes does to your body.  Those who smoke should quit.  Please discuss these with your doctor.  check your blood sugar once a day.  vary the time of day when you check, between before the 3 meals, and at bedtime.  also check if you have symptoms of your blood sugar being too high or too low.  please keep a  record of the readings and bring it to your next appointment here (or you can bring the meter itself).  You can write it on any piece of paper.  please call us sooner if your blood sugar goes below 70, or if you have a lot of readings over 200. I have sent a prescription to your pharmacy, to start tresiba.  This replaces the glimepiride. Please continue the same other diabetes medications. Please call or message [place] next week, to tell us how the blood sugar is doing. Our goal will be to stop the non-insulin meds, 1 at a time. Please come back for a follow-up appointment in 2 months.

## 2017-02-08 ENCOUNTER — Telehealth: Payer: Self-pay | Admitting: Endocrinology

## 2017-02-08 ENCOUNTER — Other Ambulatory Visit: Payer: Self-pay

## 2017-02-08 MED ORDER — INSULIN PEN NEEDLE 32G X 4 MM MISC: 11 refills | Status: DC

## 2017-02-08 NOTE — Telephone Encounter (Signed)
I have sent in a prescription for pen needles.

## 2017-02-08 NOTE — Telephone Encounter (Signed)
[facility] need a separate prescription for pen needles for the Evaristo Buryresiba, please advise [phone]

## 2017-02-12 ENCOUNTER — Ambulatory Visit (INDEPENDENT_AMBULATORY_CARE_PROVIDER_SITE_OTHER): Payer: 59 | Admitting: Nurse Practitioner

## 2017-02-12 ENCOUNTER — Encounter: Payer: Self-pay | Admitting: Nurse Practitioner

## 2017-02-12 VITALS — BP 140/86 | HR 76 | Temp 98.0°F | Resp 16 | Ht 68.0 in | Wt 228.0 lb

## 2017-02-12 DIAGNOSIS — R51 Headache: Secondary | ICD-10-CM | POA: Diagnosis not present

## 2017-02-12 DIAGNOSIS — I10 Essential (primary) hypertension: Secondary | ICD-10-CM | POA: Diagnosis not present

## 2017-02-12 DIAGNOSIS — R519 Headache, unspecified: Secondary | ICD-10-CM

## 2017-02-12 MED ORDER — VERAPAMIL HCL ER 180 MG PO TBCR: 180.0000 mg | EXTENDED_RELEASE_TABLET | Freq: Every day | ORAL | 1 refills | Status: DC

## 2017-02-12 MED ORDER — LOSARTAN POTASSIUM-HCTZ 100-25 MG PO TABS: 1.0000 | ORAL_TABLET | Freq: Every day | ORAL | 1 refills | Status: DC

## 2017-02-12 MED ORDER — OMEPRAZOLE 40 MG PO CPDR: 40.0000 mg | DELAYED_RELEASE_CAPSULE | Freq: Every day | ORAL | 1 refills | Status: DC | PRN

## 2017-02-12 MED ORDER — ATORVASTATIN CALCIUM 40 MG PO TABS: ORAL_TABLET | ORAL | 1 refills | Status: DC

## 2017-02-12 NOTE — Progress Notes (Signed)
Subjective:    Patient ID: Jason Ortega, male    DOB: 10-Mar-1965, 52 y.o.   MRN: [id number]  HPI Jason Ortega presents today for a follow up visit of hypertension. He was seen last month on 12/5 with elevated blood pressure readings. He had not been taking his daily medications-hyzaar 100-25, verapamil 135m daily as prescribed due to no refills and not- remembering to take his medications every day. We discussed risks of uncontrolled blood pressure and importance of taking his blood pressure medications daily. We also discussed the role of diet and exercise in maintaining a healthy blood pressure. He said that he felt motivated to take his blood pressure medications daily as prescribed and was instructed to return in 1 month for follow up. He was also encouraged to keep a log with home readings if possible.  Today, He reports he resumed his medications on 12/5 after our visit-hyzaar 100-25, verapamil 120 daily  and has been taking daily since. He has also been checking his BP at home about three times a week and reports readings: 150s/mid 80s. He denies vision changes, chest pain, shortness of breath. He does notice headaches and some mild intermittent lower extremity edema. He does have Neurology appointment later this month for evaluation of migraines.  BP Readings from Last 3 Encounters:  02/12/17 140/86  02/07/17 (!) 162/98  01/09/17 (!) 150/84    Review of Systems  See HPI  Past Medical History:  Diagnosis Date  . Allergy   . Diabetes mellitus   . Frequent headaches   . Glaucoma   . Hypercholesteremia   . Hypertension   . Migraines   . Sleep apnea      Social History   Socioeconomic History  . Marital status: Married    Spouse name: Not on file  . Number of children: 2  . Years of education: 159 . Highest education level: Not on file  Social Needs  . Financial resource strain: Not on file  . Food insecurity - worry: Not on file  . Food insecurity - inability: Not on file    . Transportation needs - medical: Not on file  . Transportation needs - non-medical: Not on file  Occupational History  . Occupation: Stocker  Tobacco Use  . Smoking status: Never Smoker  . Smokeless tobacco: Never Used  Substance and Sexual Activity  . Alcohol use: Yes    Comment: occasional  . Drug use: No  . Sexual activity: Yes    Birth control/protection: None  Other Topics Concern  . Not on file  Social History Narrative   Fun/Hobby: Coach football    Past Surgical History:  Procedure Laterality Date  . CARDIAC CATHETERIZATION  12/2011  . LEFT HEART CATHETERIZATION WITH CORONARY ANGIOGRAM N/A 11/12/2011   Procedure: LEFT HEART CATHETERIZATION WITH CORONARY ANGIOGRAM;  Surgeon: JLaverda Page MD;  Location: [facility];  Service: Cardiovascular;  Laterality: N/A;  . SHOULDER SURGERY      Family History  Problem Relation Age of Onset  . Arthritis Mother   . Stroke Mother   . Hypertension Father   . Diabetes Father   . Cancer Father   . Diabetes Maternal Grandfather     No Known Allergies  Current Outpatient Medications on File Prior to Visit  Medication Sig Dispense Refill  . aspirin 81 MG tablet Take 1 tablet (81 mg total) by mouth daily. 90 tablet 1  . atorvastatin (LIPITOR) 40 MG tablet TAKE 1 TABLET(40 MG) BY  MOUTH DAILY 90 tablet 0  . Blood Glucose Monitoring Suppl (ONETOUCH VERIO) w/Device KIT 1 Device by Does not apply route 2 (two) times daily as needed. 1 kit 0  . dapagliflozin propanediol (FARXIGA) 5 MG TABS tablet Take 5 mg by mouth daily. 30 tablet 2  . Exenatide ER (BYDUREON BCISE) 2 MG/0.85ML AUIJ Inject 2 mg into the skin once a week. 4 pen 1  . gabapentin (NEURONTIN) 600 MG tablet Take 600 mg by mouth 2 (two) times daily.    . insulin degludec (TRESIBA FLEXTOUCH) 100 UNIT/ML SOPN FlexTouch Pen Inject 0.1 mLs (10 Units total) into the skin daily. And pen needles 1/day 5 pen 11  . Insulin Pen Needle 32G X 4 MM MISC Used to inject insulin one time  daily. 100 each 11  . losartan-hydrochlorothiazide (HYZAAR) 100-25 MG tablet Take 1 tablet by mouth daily. 90 tablet 0  . metFORMIN (GLUCOPHAGE) 1000 MG tablet Take 1 tablet (1,000 mg total) by mouth 2 (two) times daily with a meal. 180 tablet 0  . methocarbamol (ROBAXIN) 500 MG tablet Take 1 tablet (500 mg total) by mouth 2 (two) times daily. 20 tablet 0  . omeprazole (PRILOSEC) 40 MG capsule Take 1 capsule (40 mg total) by mouth daily as needed (indigestion). 90 capsule 1  . timolol (TIMOPTIC-XR) 0.5 % ophthalmic gel-forming Place 1 drop into both eyes daily.     [name] topiramate (TOPAMAX) 25 MG capsule Take 25 mg by mouth 2 (two) times daily.    . verapamil (CALAN) 120 MG tablet Take 120 mg by mouth daily.     No current facility-administered medications on file prior to visit.     BP 140/86 (BP Location: Left Arm, Patient Position: Sitting, Cuff Size: Large)   Pulse 76   Temp 98 F (36.7 C) (Oral)   Resp 16   Ht _0  (1.727 m)   Wt 228 lb (103.4 kg)   SpO2 97%   BMI 34.67 kg/m        Objective:   Physical Exam  Vital signs reviewed. Constitutional: Patient appears well-developed and well-nourished. No distress.  HENT: Head: Normocephalic and atraumatic..  Eyes: Conjunctivae and EOM are normal. Pupils are equal, round, and reactive to light. No scleral icterus.  Neck: Normal range of motion. Neck supple.  Cardiovascular: Normal rate, regular rhythm and normal heart sounds.  No murmur heard. No BLE edema. Intact distal pulses. Pulmonary/Chest: Effort normal and breath sounds normal. Musculoskeletal: Normal range of motion, no joint effusions. No gross deformities Neurological: he is alert and oriented to person, place, and time. No cranial nerve deficit. Coordination, balance, strength, speech and gait are normal.  Skin: Skin is warm and dry. No rash noted. No erythema.  Psychiatric: Patient has a normal mood and affect. behavior is normal. Judgment and thought content  normal.     Assessment & Plan:  RTC in 2 weeks for follow up of new medication, BP reading

## 2017-02-12 NOTE — Patient Instructions (Addendum)
Please continue your hyzaar. Please start verapamil 180mg  daily.  Our goal is to keep your blood pressure below 140/90 consistently. Remember healthy diet and exercise to lower blood pressure as well.  Id like to see you back in about 2 weeks, to follow up on your blood pressure.  It was good to see you. Thanks for letting me take care of you today :)

## 2017-02-14 ENCOUNTER — Telehealth: Payer: Self-pay | Admitting: Nurse Practitioner

## 2017-02-14 NOTE — Telephone Encounter (Signed)
Copied from CRM [id number]. Topic: Quick Communication - See Telephone Encounter >> Feb 14, 2017 11:34 AM Cipriano BunkerLambe, Annette S wrote: CRM for notification. See Telephone encounter for:   Pt. Called saying his prescriptions for  Farxiga, Topamax,  Metformin,  & Methocarbamol  need to be sent to the mail order pharmacy  [facility] [place] [id number] AT Portal to Registered [facility] [address] [place] [id number] Phone: [phone] Fax: [phone]

## 2017-02-15 ENCOUNTER — Other Ambulatory Visit: Payer: Self-pay

## 2017-02-15 ENCOUNTER — Other Ambulatory Visit: Payer: Self-pay | Admitting: Nurse Practitioner

## 2017-02-15 DIAGNOSIS — I10 Essential (primary) hypertension: Secondary | ICD-10-CM

## 2017-02-15 MED ORDER — DAPAGLIFLOZIN PROPANEDIOL 5 MG PO TABS: 5.0000 mg | ORAL_TABLET | Freq: Every day | ORAL | 2 refills | Status: DC

## 2017-02-15 MED ORDER — METFORMIN HCL 1000 MG PO TABS: 1000.0000 mg | ORAL_TABLET | Freq: Two times a day (BID) | ORAL | 0 refills | Status: DC

## 2017-02-15 MED ORDER — TOPIRAMATE 25 MG PO CPSP: 25.0000 mg | ORAL_CAPSULE | Freq: Two times a day (BID) | ORAL | 1 refills | Status: DC

## 2017-02-15 MED ORDER — METHOCARBAMOL 500 MG PO TABS: 500.0000 mg | ORAL_TABLET | Freq: Two times a day (BID) | ORAL | 0 refills | Status: DC

## 2017-02-15 NOTE — Telephone Encounter (Signed)
Refills sent

## 2017-02-15 NOTE — Telephone Encounter (Signed)
Pt. Asking for refills of Robaxin and Topamax to his mail order. I refilled his other medications. Thanks.

## 2017-02-23 ENCOUNTER — Encounter: Payer: Self-pay | Admitting: Nurse Practitioner

## 2017-02-23 DIAGNOSIS — R519 Headache, unspecified: Secondary | ICD-10-CM | POA: Insufficient documentation

## 2017-02-23 DIAGNOSIS — R51 Headache: Secondary | ICD-10-CM

## 2017-02-23 NOTE — Assessment & Plan Note (Signed)
Neuro exam is normal today. We will work on BP control and he will follow up with neuro on 1/28-appointment scheduled

## 2017-02-23 NOTE — Assessment & Plan Note (Signed)
BP remains slightly elevated at home and in clinic. He reports improved compliance and monitoring BP readings at home Continue hyzaar ; increase verapamil dosage BP goal and healthy diet and exercise discussed - verapamil (CALAN-SR) 180 MG CR tablet; Take 1 tablet (180 mg total) by mouth daily.  Dispense: 30 tablet; Refill: 1 RTC in 2 weeks for follow up of new medication, BP reading

## 2017-03-01 ENCOUNTER — Ambulatory Visit: Payer: 59 | Admitting: Nurse Practitioner

## 2017-03-04 ENCOUNTER — Encounter: Payer: Self-pay | Admitting: Neurology

## 2017-03-04 ENCOUNTER — Ambulatory Visit (INDEPENDENT_AMBULATORY_CARE_PROVIDER_SITE_OTHER): Payer: 59 | Admitting: Neurology

## 2017-03-04 VITALS — BP 117/74 | HR 84 | Ht 68.0 in | Wt 227.0 lb

## 2017-03-04 DIAGNOSIS — G43711 Chronic migraine without aura, intractable, with status migrainosus: Secondary | ICD-10-CM | POA: Diagnosis not present

## 2017-03-04 MED ORDER — TOPIRAMATE 100 MG PO TABS: 100.0000 mg | ORAL_TABLET | Freq: Every day | ORAL | 6 refills | Status: DC

## 2017-03-04 MED ORDER — FREMANEZUMAB-VFRM 225 MG/1.5ML ~~LOC~~ SOSY: 225.0000 mg | PREFILLED_SYRINGE | SUBCUTANEOUS | 0 refills | Status: DC

## 2017-03-04 MED ORDER — SUMATRIPTAN SUCCINATE 100 MG PO TABS: 100.0000 mg | ORAL_TABLET | Freq: Once | ORAL | 12 refills | Status: DC | PRN

## 2017-03-04 MED ORDER — FREMANEZUMAB-VFRM 225 MG/1.5ML ~~LOC~~ SOSY: 225.0000 mg | PREFILLED_SYRINGE | SUBCUTANEOUS | 11 refills | Status: DC

## 2017-03-04 NOTE — Patient Instructions (Signed)
Increase Topiramate to 100mg /day. May furtejr increase SUmtraiptan: Please take one tablet at the onset of your headache. If it does not improve the symptoms please take one additional tablet. Do not take more then 2 tablets in 24hrs. Do not take use more then 2 to 3 times in a week. Ajovy: monthly injection Use CPAP  Sumatriptan tablets What is this medicine? SUMATRIPTAN (soo ma TRIP tan) is used to treat migraines with or without aura. An aura is a strange feeling or visual disturbance that warns you of an attack. It is not used to prevent migraines. This medicine may be used for other purposes; ask your health care provider or pharmacist if you have questions. COMMON BRAND NAME(S): Imitrex, Migraine Pack What should I tell my health care provider before I take this medicine? They need to know if you have any of these conditions: -circulation problems in fingers and toes -diabetes -heart disease -high blood pressure -high cholesterol -history of irregular heartbeat -history of stroke -kidney disease -liver disease -postmenopausal or surgical removal of uterus and ovaries -seizures -smoke tobacco -stomach or intestine problems -an unusual or allergic reaction to sumatriptan, other medicines, foods, dyes, or preservatives -pregnant or trying to get pregnant -breast-feeding How should I use this medicine? Take this medicine by mouth with a glass of water. Follow the directions on the prescription label. This medicine is taken at the first symptoms of a migraine. It is not for everyday use. If your migraine headache returns after one dose, you can take another dose as directed. You must leave at least 2 hours between doses, and do not take more than 100 mg as a single dose. Do not take more than 200 mg total in any 24 hour period. If there is no improvement at all after the first dose, do not take a second dose without talking to your doctor or health care professional. Do not take your  medicine more often than directed. Talk to your pediatrician regarding the use of this medicine in children. Special care may be needed. Overdosage: If you think you have taken too much of this medicine contact a poison control center or emergency room at once. NOTE: This medicine is only for you. Do not share this medicine with others. What if I miss a dose? This does not apply; this medicine is not for regular use. What may interact with this medicine? Do not take this medicine with any of the following medicines: -cocaine -ergot alkaloids like dihydroergotamine, ergonovine, ergotamine, methylergonovine -feverfew -MAOIs like Carbex, Eldepryl, Marplan, Nardil, and Parnate -other medicines for migraine headache like almotriptan, eletriptan, frovatriptan, naratriptan, rizatriptan, zolmitriptan -tryptophan This medicine may also interact with the following medications: -certain medicines for depression, anxiety, or psychotic disturbances This list may not describe all possible interactions. Give your health care provider a list of all the medicines, herbs, non-prescription drugs, or dietary supplements you use. Also tell them if you smoke, drink alcohol, or use illegal drugs. Some items may interact with your medicine. What should I watch for while using this medicine? Only take this medicine for a migraine headache. Take it if you get warning symptoms or at the start of a migraine attack. It is not for regular use to prevent migraine attacks. You may get drowsy or dizzy. Do not drive, use machinery, or do anything that needs mental alertness until you know how this medicine affects you. To reduce dizzy or fainting spells, do not sit or stand up quickly, especially if you are an  older patient. Alcohol can increase drowsiness, dizziness and flushing. Avoid alcoholic drinks. Smoking cigarettes may increase the risk of heart-related side effects from using this medicine. If you take migraine  medicines for 10 or more days a month, your migraines may get worse. Keep a diary of headache days and medicine use. Contact your healthcare professional if your migraine attacks occur more frequently. What side effects may I notice from receiving this medicine? Side effects that you should report to your doctor or health care professional as soon as possible: -allergic reactions like skin rash, itching or hives, swelling of the face, lips, or tongue -bloody or watery diarrhea -hallucination, loss of contact with reality -pain, tingling, numbness in the face, hands, or feet -seizures -signs and symptoms of a blood clot such as breathing problems; changes in vision; chest pain; severe, sudden headache; pain, swelling, warmth in the leg; trouble speaking; sudden numbness or weakness of the face, arm, or leg -signs and symptoms of a dangerous change in heartbeat or heart rhythm like chest pain; dizziness; fast or irregular heartbeat; palpitations, feeling faint or lightheaded; falls; breathing problems -signs and symptoms of a stroke like changes in vision; confusion; trouble speaking or understanding; severe headaches; sudden numbness or weakness of the face, arm, or leg; trouble walking; dizziness; loss of balance or coordination -stomach pain Side effects that usually do not require medical attention (report to your doctor or health care professional if they continue or are bothersome): -changes in taste -facial flushing -headache -muscle cramps -muscle pain -nausea, vomiting -weak or tired This list may not describe all possible side effects. Call your doctor for medical advice about side effects. You may report side effects to FDA at 1-800-FDA-1088. Where should I keep my medicine? Keep out of the reach of children. Store at room temperature between 2 and 30 degrees C (36 and 86 degrees F). Throw away any unused medicine after the expiration date. NOTE: This sheet is a summary. It may not  cover all possible information. If you have questions about this medicine, talk to your doctor, pharmacist, or health care provider.  2018 Elsevier/Gold Standard (2015-02-24 12:38:23)
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
## 2017-03-04 NOTE — Addendum Note (Signed)
Addended by: Bertram SavinULBERTSON, Gurnie Duris L on: 03/04/2017 08:18 AM   Modules accepted: Orders

## 2017-03-04 NOTE — Progress Notes (Signed)
Discussed Ajovy injection with the patient and gave them instruction sheets from the box. The patient is aware that injection is subcutaneous and should be given every 30 days. Syringes should remain refrigerated until ready for use. Let the syringe sit at room temperature for 30 minutes prior to injection. The patient was also educated on the available sites to rotate each month (abdomen, thigh, and back of upper arm). I discussed proper technique for skin prep prior to injection including to wash hands, cleanse the site inside to outside in a circular motion and allow skin to air dry. The patient was instructed how to pinch the skin, inject the syringe at a 45-90 degree angle, and inject all of the medication. Immediately place the used syringe in the sharps container and do not recap. RN administered medication today in the posterior RUA. The patient tolerated well and verbalized understanding of instructions.

## 2017-03-08 ENCOUNTER — Ambulatory Visit: Payer: 59 | Admitting: Nurse Practitioner

## 2017-03-08 DIAGNOSIS — Z0289 Encounter for other administrative examinations: Secondary | ICD-10-CM

## 2017-03-11 ENCOUNTER — Telehealth: Payer: Self-pay

## 2017-03-11 NOTE — Telephone Encounter (Signed)
Rn receive fax from patients pharmacy [facility]. PA done on phone by calling  [phone]. RN spoke with Alcario DroughtErica. Rn explain pt has tried topamax and failed. Rn gave chronic migraine diagnose code. Ajovy 225mg /1.65ml injection for 1 injection per month.  PA approve 03/11/2017 to 06/08/2017.

## 2017-03-25 ENCOUNTER — Ambulatory Visit: Payer: Self-pay | Admitting: *Deleted

## 2017-03-25 NOTE — Telephone Encounter (Signed)
Pt states he has been taking Topiramate since 1/2/-19 and has experienced dizziness and lightheadedness for the past 1 1/2-2 weeks. Pt requesting to make an appt. PCP, Alphonse GuildAshleigh Shambley has no availability for this week. Appt scheduled with Dr. Jordan LikesSchmitz on 2/19.

## 2017-03-26 ENCOUNTER — Telehealth: Payer: Self-pay | Admitting: Neurology

## 2017-03-26 ENCOUNTER — Ambulatory Visit: Payer: Self-pay | Admitting: Neurology

## 2017-03-26 ENCOUNTER — Encounter: Payer: Self-pay | Admitting: Family Medicine

## 2017-03-26 ENCOUNTER — Ambulatory Visit (INDEPENDENT_AMBULATORY_CARE_PROVIDER_SITE_OTHER): Payer: 59 | Admitting: Family Medicine

## 2017-03-26 ENCOUNTER — Other Ambulatory Visit: Payer: Self-pay | Admitting: Nurse Practitioner

## 2017-03-26 VITALS — BP 118/66 | HR 87 | Temp 98.3°F | Ht 68.0 in | Wt 225.0 lb

## 2017-03-26 DIAGNOSIS — R42 Dizziness and giddiness: Secondary | ICD-10-CM | POA: Diagnosis not present

## 2017-03-26 NOTE — Patient Instructions (Signed)
Please try to wear compression stocks. Please avoid standing for prolonged periods of time no longer than 30 minutes at a time. Please try to increase her water intake. Please have a slow transition from sitting to standing. The viral illness may be contributing to what he had going on. Please follow-up with me in 4-6 weeks if you don't have any improvement. W may need to make changes to your medications.

## 2017-03-26 NOTE — Telephone Encounter (Signed)
Spoke with patient. He reports dizziness and lightheadedness off and on for about a week. He is staying hydrated. He stated that dizziness can come out of nowhere or happen with a position change (not a specific position). He increased Topamax dose on 03/04/17. He saw his PCP today. Per pt PCP was not concerned. He also noted that soda tastes flat too. RN discussed possible s/e with Topiramate include the above. Told pt RN will let Dr. Lucia GaskinsAhern know and will call pt back.

## 2017-03-26 NOTE — Telephone Encounter (Signed)
Pt called stating he has been feeling lightheaded and dizzy for about a week. Pt isnt sure if its from the upping of his medication or not. Pt would like a call back to discuss with the RN

## 2017-03-26 NOTE — Assessment & Plan Note (Signed)
Unclear if his symptoms are associated with an underlying viral illness currently. He has had changes in his recent medications that could be contributing. His blood pressure medications were changed about 6 weeks ago but his blood pressures currently will control today. Denies any visual changes. - Counseled on supportive measures  - if no improvement with initial therapies then would consider changing medications versus referral to physical therapy for vestibular ocular rehabilitation. Could consider referral to cardiology.

## 2017-03-26 NOTE — Progress Notes (Signed)
Jason Ortega - 52 y.o. male MRN [id number]  Date of birth: [birth year]  SUBJECTIVE:  Including CC & ROS.  Chief Complaint  Patient presents with  . Dizziness    Jason Ortega is a 52 y.o. male that is presenting with lightheadedness. Symptoms started two weeks ago. Admits to being lightheaded when he stands up. He noticed the symptoms when his Topamax was increased 03/04/17.Denies vision problems. Denies headaches. He also had his blood pressure changed at the beginning of January. He also is dealing with some cold-like symptoms. His symptoms are intermittent in nature. They seem to be staying the same. His diabetes is fairly well controlled. He does have some symptoms of neuropathy. He denies any leg swelling. He gets his eyes checked in a month or 2.   Review of Systems  Constitutional: Positive for activity change. Negative for fever.  HENT: Negative for rhinorrhea.   Respiratory: Negative for cough.   Cardiovascular: Negative for chest pain.  Gastrointestinal: Negative for abdominal pain.  Musculoskeletal: Negative for back pain.  Skin: Negative for color change.  Neurological: Positive for light-headedness and numbness.  Hematological: Negative for adenopathy.  Psychiatric/Behavioral: Negative for agitation.    HISTORY: Past Medical, Surgical, Social, and Family History Reviewed & Updated per EMR.   Pertinent Historical Findings include:  Past Medical History:  Diagnosis Date  . Allergy   . Diabetes mellitus   . Frequent headaches   . Glaucoma   . Hypercholesteremia   . Hypertension   . Migraines   . Sleep apnea     Past Surgical History:  Procedure Laterality Date  . CARDIAC CATHETERIZATION  12/2011  . LEFT HEART CATHETERIZATION WITH CORONARY ANGIOGRAM N/A 11/12/2011   Procedure: LEFT HEART CATHETERIZATION WITH CORONARY ANGIOGRAM;  Surgeon: Pamella Pert, MD;  Location: [facility] CATH LAB;  Service: Cardiovascular;  Laterality: N/A;  . SHOULDER SURGERY      No Known  Allergies  Family History  Problem Relation Age of Onset  . Arthritis Mother   . Stroke Mother   . Hypertension Father   . Diabetes Father   . Cancer Father   . Diabetes Maternal Grandfather      Social History   Socioeconomic History  . Marital status: Married    Spouse name: Not on file  . Number of children: 2  . Years of education: 71  . Highest education level: Not on file  Social Needs  . Financial resource strain: Not on file  . Food insecurity - worry: Not on file  . Food insecurity - inability: Not on file  . Transportation needs - medical: Not on file  . Transportation needs - non-medical: Not on file  Occupational History  . Occupation: Stocker  Tobacco Use  . Smoking status: Never Smoker  . Smokeless tobacco: Never Used  Substance and Sexual Activity  . Alcohol use: Yes    Comment: occasional  . Drug use: No  . Sexual activity: Yes    Birth control/protection: None  Other Topics Concern  . Not on file  Social History Narrative   Fun/Hobby: Psychologist, occupational football     PHYSICAL EXAM:  VS: BP 118/66 (BP Location: Left Arm, Patient Position: Sitting, Cuff Size: Normal)   Pulse 87   Temp 98.3 F (36.8 C) (Oral)   Ht 5\' 8"  (1.727 m)   Wt 225 lb (102.1 kg)   SpO2 98%   BMI 34.21 kg/m  Physical Exam Gen: NAD, alert, cooperative with exam,  ENT: normal lips, normal nasal  mucosa,  Eye: normal EOM, normal conjunctiva and lids CV:  no edema, +2 pedal pulses, regular rate and rhythm, S1-S2   Resp: no accessory muscle use, non-labored, clear to auscultation bilaterally, no crackles or wheezes Skin: no rashes, no areas of induration  Neuro: normal tone, normal sensation to touch Psych:  normal insight, alert and oriented MSK: Normal gait, normal strength       ASSESSMENT & PLAN:   Lightheadedness Unclear if his symptoms are associated with an underlying viral illness currently. He has had changes in his recent medications that could be contributing. His  blood pressure medications were changed about 6 weeks ago but his blood pressures currently will control today. Denies any visual changes. - Counseled on supportive measures  - if no improvement with initial therapies then would consider changing medications versus referral to physical therapy for vestibular ocular rehabilitation. Could consider referral to cardiology.

## 2017-03-27 ENCOUNTER — Encounter: Payer: Self-pay | Admitting: *Deleted

## 2017-03-27 NOTE — Telephone Encounter (Signed)
Spoke with patient. Letter written & signed. Ready for pickup at front desk. He will come pick up and is aware office closes at 5 pm.

## 2017-03-27 NOTE — Telephone Encounter (Signed)
Per Dr. Lucia GaskinsAhern, pt can resume previous dose of Topiramate and see if that improves his symptoms.   Called patient and discussed Topiramate. He stated he didn't take any last night and only had one episode. He stated that he started taking Topiramate 25 mg then went to 50 mg. He started the 100 mg and that was when "things went haywire". Advised patient to back to the Topiramate 50 mg at night and see if that helps. Encouraged patient to please call back if needed.  He was unable to go to work last night 2/19 and requested a work note for that shift. Will call back re: note. He verbalized appreciation.

## 2017-03-27 NOTE — Telephone Encounter (Signed)
Pt has called back to see if the message was forwarded to Dr Lucia GaskinsAhern because he states he very much needs a response as soon as possible

## 2017-04-05 ENCOUNTER — Encounter: Payer: Self-pay | Admitting: Nurse Practitioner

## 2017-04-05 ENCOUNTER — Ambulatory Visit (INDEPENDENT_AMBULATORY_CARE_PROVIDER_SITE_OTHER): Payer: 59 | Admitting: Nurse Practitioner

## 2017-04-05 ENCOUNTER — Other Ambulatory Visit (INDEPENDENT_AMBULATORY_CARE_PROVIDER_SITE_OTHER): Payer: 59

## 2017-04-05 VITALS — BP 130/72 | HR 73 | Temp 97.8°F | Resp 16 | Ht 68.0 in | Wt 226.0 lb

## 2017-04-05 DIAGNOSIS — E119 Type 2 diabetes mellitus without complications: Secondary | ICD-10-CM

## 2017-04-05 DIAGNOSIS — I10 Essential (primary) hypertension: Secondary | ICD-10-CM

## 2017-04-05 DIAGNOSIS — R6889 Other general symptoms and signs: Secondary | ICD-10-CM

## 2017-04-05 LAB — TSH: TSH: 1.54 u[IU]/mL (ref 0.35–4.50)

## 2017-04-05 LAB — BASIC METABOLIC PANEL
BUN: 14 mg/dL (ref 6–23)
CALCIUM: 9.7 mg/dL (ref 8.4–10.5)
CHLORIDE: 103 meq/L (ref 96–112)
CO2: 30 meq/L (ref 19–32)
CREATININE: 1.05 mg/dL (ref 0.40–1.50)
GFR: 94.85 mL/min (ref >60.00)
Glucose, Bld: 98 mg/dL (ref 70–99)
Potassium: 3.7 mEq/L (ref 3.5–5.1)
Sodium: 142 mEq/L (ref 135–145)

## 2017-04-05 MED ORDER — DAPAGLIFLOZIN PROPANEDIOL 5 MG PO TABS: 5.0000 mg | ORAL_TABLET | Freq: Every day | ORAL | 0 refills | Status: DC

## 2017-04-05 NOTE — Assessment & Plan Note (Signed)
Continue follow up with endocrinology. - dapagliflozin propanediol (FARXIGA) 5 MG TABS tablet; Take 5 mg by mouth daily.  Dispense: 30 tablet; Refill: 0 - Basic metabolic panel; Future - TSH; Future

## 2017-04-05 NOTE — Progress Notes (Signed)
Name: Jason Ortega   MRN: [id number]    DOB: 1965-05-20   Date:04/05/2017       Progress Note  Subjective  Chief Complaint  Chief Complaint  Patient presents with  . Follow-up    blood pressure    HPI Mr Muccio is here for follow up of blood pressure. He Is also requesting thyroid testing and refill of diabetes medication.  Hypertension -    At his last visit with me on 1/8, he had resumed his hyzaar 100-25, verapamil 120 daily for about a month, after actually being off the medications for some time due to no refills. His blood pressure was improving but still slightly elevated at 140/86 so we decided to increase his verapamil dosage to 180 daily. He was complaining of some headaches and mild lower extremity edema with upcoming neurology appointment for evaluation of migraines.    He was unable to return for follow up until today. He says he has been taking the hyzaar and verapamil as prescribed. He is tolerating the new dosages well. He reports checking his blood pressure at home with readings from 110s-130/60s-70s. His has seen neurology and started treatment for migraines with improvement. His edema has also improved. He denies vision changes, chest pain, shortness of breath.  BP Readings from Last 3 Encounters:  04/05/17 130/72  03/26/17 118/66  03/04/17 117/74   Cold intolerance- This is a new problem He has noted feeling colder than normal. He denies weakness, depression, constipation, skin changes. His wife wanted him to have his thyroid checked.  Diabetes- He has started following with endocrinology to manage his diabetes. He was started on tresiba with plan to eventually wean off his oral medications He has a follow up with endocrinology next week but asks if he can have refill of farxiga while he is here today.  Patient Active Problem List   Diagnosis Date Noted  . Lightheadedness 03/26/2017  . Chronic migraine without aura, with intractable migraine, so stated, with  status migrainosus 03/04/2017  . Chronic bilateral low back pain with bilateral sciatica 12/07/2016  . Type 2 diabetes mellitus without complication, without long-term current use of insulin ([place]) 06/06/2016  . Essential hypertension 06/06/2016  . Gastroesophageal reflux disease without esophagitis 06/06/2016    Past Surgical History:  Procedure Laterality Date  . CARDIAC CATHETERIZATION  12/2011  . LEFT HEART CATHETERIZATION WITH CORONARY ANGIOGRAM N/A 11/12/2011   Procedure: LEFT HEART CATHETERIZATION WITH CORONARY ANGIOGRAM;  Surgeon: Laverda Page, MD;  Location: [facility] CATH LAB;  Service: Cardiovascular;  Laterality: N/A;  . SHOULDER SURGERY      Family History  Problem Relation Age of Onset  . Arthritis Mother   . Stroke Mother   . Hypertension Father   . Diabetes Father   . Cancer Father   . Diabetes Maternal Grandfather     Social History   Socioeconomic History  . Marital status: Married    Spouse name: Not on file  . Number of children: 2  . Years of education: 36  . Highest education level: Not on file  Social Needs  . Financial resource strain: Not on file  . Food insecurity - worry: Not on file  . Food insecurity - inability: Not on file  . Transportation needs - medical: Not on file  . Transportation needs - non-medical: Not on file  Occupational History  . Occupation: Stocker  Tobacco Use  . Smoking status: Never Smoker  . Smokeless tobacco: Never Used  Substance and Sexual  Activity  . Alcohol use: Yes    Comment: occasional  . Drug use: No  . Sexual activity: Yes    Birth control/protection: None  Other Topics Concern  . Not on file  Social History Narrative   Fun/Hobby: Coach football     Current Outpatient Medications:  .  aspirin 81 MG tablet, Take 1 tablet (81 mg total) by mouth daily., Disp: 90 tablet, Rfl: 1 .  atorvastatin (LIPITOR) 40 MG tablet, TAKE 1 TABLET(40 MG) BY MOUTH DAILY, Disp: 90 tablet, Rfl: 1 .  Blood Glucose Monitoring  Suppl (ONETOUCH VERIO) w/Device KIT, 1 Device by Does not apply route 2 (two) times daily as needed., Disp: 1 kit, Rfl: 0 .  dapagliflozin propanediol (FARXIGA) 5 MG TABS tablet, Take 5 mg by mouth daily., Disp: 30 tablet, Rfl: 0 .  Exenatide ER (BYDUREON BCISE) 2 MG/0.85ML AUIJ, Inject 2 mg into the skin once a week., Disp: 4 pen, Rfl: 1 .  Fremanezumab-vfrm (AJOVY) 225 MG/1.5ML SOSY, Inject 225 mg into the skin every 30 (thirty) days., Disp: 1 Syringe, Rfl: 0 .  gabapentin (NEURONTIN) 600 MG tablet, Take 600 mg by mouth 2 (two) times daily., Disp: , Rfl:  .  insulin degludec (TRESIBA FLEXTOUCH) 100 UNIT/ML SOPN FlexTouch Pen, Inject 0.1 mLs (10 Units total) into the skin daily. And pen needles 1/day, Disp: 5 pen, Rfl: 11 .  Insulin Pen Needle 32G X 4 MM MISC, Used to inject insulin one time daily., Disp: 100 each, Rfl: 11 .  losartan-hydrochlorothiazide (HYZAAR) 100-25 MG tablet, Take 1 tablet by mouth daily., Disp: 90 tablet, Rfl: 1 .  metFORMIN (GLUCOPHAGE) 1000 MG tablet, Take 1 tablet (1,000 mg total) by mouth 2 (two) times daily with a meal., Disp: 180 tablet, Rfl: 0 .  methocarbamol (ROBAXIN) 500 MG tablet, Take 1 tablet (500 mg total) by mouth 2 (two) times daily., Disp: 20 tablet, Rfl: 0 .  omeprazole (PRILOSEC) 40 MG capsule, Take 1 capsule (40 mg total) by mouth daily as needed (indigestion)., Disp: 90 capsule, Rfl: 1 .  SUMAtriptan (IMITREX) 100 MG tablet, Take 1 tablet (100 mg total) by mouth once as needed for up to 1 dose. May repeat in 2 hours if headache persists or recurs., Disp: 10 tablet, Rfl: 12 .  timolol (TIMOPTIC-XR) 0.5 % ophthalmic gel-forming, Place 1 drop into both eyes daily. , Disp: , Rfl:  .  topiramate (TOPAMAX) 100 MG tablet, Take 1 tablet (100 mg total) by mouth daily., Disp: 30 tablet, Rfl: 6 .  verapamil (CALAN-SR) 180 MG CR tablet, Take 1 tablet (180 mg total) by mouth daily., Disp: 30 tablet, Rfl: 1  No Known Allergies   ROS See HPI  Objective  Vitals:    04/05/17 1043  BP: 130/72  Pulse: 73  Resp: 16  Temp: 97.8 F (36.6 C)  TempSrc: Oral  SpO2: 99%  Weight: 226 lb (102.5 kg)  Height: _0  (1.727 m)   Body mass index is 34.36 kg/m.  Physical Exam Vital signs reviewed. Constitutional: Patient appears well-developed and well-nourished. No distress.  HENT: Head: Normocephalic and atraumatic..  Eyes: Conjunctivae and EOM are normal. Pupils are equal, round, and reactive to light. No scleral icterus.  Neck: Normal range of motion. Neck supple.  Cardiovascular: Normal rate, regular rhythm and normal heart sounds.  No murmur heard. No BLE edema. Intact distal pulses. Pulmonary/Chest: Effort normal and breath sounds normal. Musculoskeletal: Normal range of motion, no joint effusions. No gross deformities Neurological: he is alert and  oriented to person, place, and time. No cranial nerve deficit. Coordination, balance, strength, speech and gait are normal.  Skin: Skin is warm and dry. No rash noted. No erythema.  Psychiatric: Patient has a normal mood and affect. behavior is normal. Judgment and thought content normal.  Recent Results (from the past 2160 hour(s))  POCT HgB A1C     Status: None   Collection Time: 02/07/17 10:15 AM  Result Value Ref Range   Hemoglobin A1C 7.9       Assessment & Plan RTC in 6 months for follow up of hypertension, CPE   Cold intolerance - TSH; Future

## 2017-04-05 NOTE — Assessment & Plan Note (Signed)
Stable, continue current medications - Basic metabolic panel; Future 

## 2017-04-05 NOTE — Patient Instructions (Signed)
Please head downstairs for lab work.  I am glad to see your blood pressure has improved. We will continue medications as you have been taking and I will see you back in about 6 months, or sooner if you need. We can do your annual physical at your next visit if you feel well.  It was good to see you. Thanks for letting me take care of you today :)

## 2017-04-08 ENCOUNTER — Encounter: Payer: Self-pay | Admitting: Endocrinology

## 2017-04-08 ENCOUNTER — Ambulatory Visit (INDEPENDENT_AMBULATORY_CARE_PROVIDER_SITE_OTHER): Payer: 59 | Admitting: Endocrinology

## 2017-04-08 DIAGNOSIS — R202 Paresthesia of skin: Secondary | ICD-10-CM | POA: Diagnosis not present

## 2017-04-08 LAB — HEMOGLOBIN A1C: Hgb A1c MFr Bld: 8.7 % — ABNORMAL HIGH (ref 4.6–6.5)

## 2017-04-08 LAB — VITAMIN B12: VITAMIN B 12: 277 pg/mL (ref 211–911)

## 2017-04-08 MED ORDER — INSULIN DEGLUDEC 100 UNIT/ML ~~LOC~~ SOPN: 20.0000 [IU] | PEN_INJECTOR | Freq: Every day | SUBCUTANEOUS | 11 refills | Status: DC

## 2017-04-08 NOTE — Patient Instructions (Addendum)
blood tests are requested for you today.  We'll let you know about the results.  Please continue the same medications check your blood sugar twice a day.  vary the time of day when you check, between before the 3 meals, and at bedtime.  also check if you have symptoms of your blood sugar being too high or too low.  please keep a record of the readings and bring it to your next appointment here (or you can bring the meter itself).  You can write it on any piece of paper.  please call us sooner if your blood sugar goes below 70, or if you have a lot of readings over 200. Please come back for a follow-up appointment in 3 months.

## 2017-04-08 NOTE — Progress Notes (Signed)
Subjective:    Patient ID: Jason Ortega, male    DOB: 1965/04/21, 52 y.o.   MRN: [id number]  HPI Pt returns for f/u of diabetes mellitus: DM type: Insulin-requiring type 2 Dx'ed: 5784 Complications: none Therapy: insulin since 2019, bydureon, and 2 oral meds DKA: never Severe hypoglycemia: never Pancreatitis: never Pancreatic imaging:  Other: he works 3rd shift, in a warehouse; edema limits rx options; he declines multiple daily injections Interval history: no cbg record, but states cbg's vary from 100-180.  He has slight tingling of the feet Past Medical History:  Diagnosis Date  . Allergy   . Diabetes mellitus   . Frequent headaches   . Glaucoma   . Hypercholesteremia   . Hypertension   . Migraines   . Sleep apnea     Past Surgical History:  Procedure Laterality Date  . CARDIAC CATHETERIZATION  12/2011  . LEFT HEART CATHETERIZATION WITH CORONARY ANGIOGRAM N/A 11/12/2011   Procedure: LEFT HEART CATHETERIZATION WITH CORONARY ANGIOGRAM;  Surgeon: Laverda Page, MD;  Location: [facility] CATH LAB;  Service: Cardiovascular;  Laterality: N/A;  . SHOULDER SURGERY      Social History   Socioeconomic History  . Marital status: Married    Spouse name: Not on file  . Number of children: 2  . Years of education: 91  . Highest education level: Not on file  Social Needs  . Financial resource strain: Not on file  . Food insecurity - worry: Not on file  . Food insecurity - inability: Not on file  . Transportation needs - medical: Not on file  . Transportation needs - non-medical: Not on file  Occupational History  . Occupation: Stocker  Tobacco Use  . Smoking status: Never Smoker  . Smokeless tobacco: Never Used  Substance and Sexual Activity  . Alcohol use: Yes    Comment: occasional  . Drug use: No  . Sexual activity: Yes    Birth control/protection: None  Other Topics Concern  . Not on file  Social History Narrative   Fun/Hobby: Coach football    Current  Outpatient Medications on File Prior to Visit  Medication Sig Dispense Refill  . aspirin 81 MG tablet Take 1 tablet (81 mg total) by mouth daily. 90 tablet 1  . atorvastatin (LIPITOR) 40 MG tablet TAKE 1 TABLET(40 MG) BY MOUTH DAILY 90 tablet 1  . Blood Glucose Monitoring Suppl (ONETOUCH VERIO) w/Device KIT 1 Device by Does not apply route 2 (two) times daily as needed. 1 kit 0  . dapagliflozin propanediol (FARXIGA) 5 MG TABS tablet Take 5 mg by mouth daily. 30 tablet 0  . Exenatide ER (BYDUREON BCISE) 2 MG/0.85ML AUIJ Inject 2 mg into the skin once a week. 4 pen 1  . Fremanezumab-vfrm (AJOVY) 225 MG/1.5ML SOSY Inject 225 mg into the skin every 30 (thirty) days. 1 Syringe 0  . gabapentin (NEURONTIN) 600 MG tablet Take 600 mg by mouth 2 (two) times daily.    . Insulin Pen Needle 32G X 4 MM MISC Used to inject insulin one time daily. 100 each 11  . losartan-hydrochlorothiazide (HYZAAR) 100-25 MG tablet Take 1 tablet by mouth daily. 90 tablet 1  . metFORMIN (GLUCOPHAGE) 1000 MG tablet Take 1 tablet (1,000 mg total) by mouth 2 (two) times daily with a meal. 180 tablet 0  . methocarbamol (ROBAXIN) 500 MG tablet Take 1 tablet (500 mg total) by mouth 2 (two) times daily. 20 tablet 0  . omeprazole (PRILOSEC) 40 MG capsule Take 1  capsule (40 mg total) by mouth daily as needed (indigestion). 90 capsule 1  . SUMAtriptan (IMITREX) 100 MG tablet Take 1 tablet (100 mg total) by mouth once as needed for up to 1 dose. May repeat in 2 hours if headache persists or recurs. 10 tablet 12  . timolol (TIMOPTIC-XR) 0.5 % ophthalmic gel-forming Place 1 drop into both eyes daily.     [name] topiramate (TOPAMAX) 100 MG tablet Take 1 tablet (100 mg total) by mouth daily. 30 tablet 6  . verapamil (CALAN-SR) 180 MG CR tablet Take 1 tablet (180 mg total) by mouth daily. 30 tablet 1   No current facility-administered medications on file prior to visit.     No Known Allergies  Family History  Problem Relation Age of Onset  .  Arthritis Mother   . Stroke Mother   . Hypertension Father   . Diabetes Father   . Cancer Father   . Diabetes Maternal Grandfather     BP 140/78 (BP Location: Left Arm, Patient Position: Sitting, Cuff Size: Large)   Pulse 84   Temp 97.7 F (36.5 C) (Oral)   Wt 229 lb (103.9 kg)   BMI 34.82 kg/m   Review of Systems He denies hypoglycemia    Objective:   Physical Exam VITAL SIGNS:  See vs page GENERAL: no distress Pulses: dorsalis pedis intact bilat.   MSK: no deformity of the feet CV: 1+ bilat leg edema Skin:  no ulcer on the feet, but the skin is dry.  normal color and temp on the feet. Neuro: sensation is intact to touch on the feet   Lab Results  Component Value Date   HGBA1C 8.7 (H) 04/08/2017   Lab Results  Component Value Date   CREATININE 1.05 04/05/2017   BUN 14 04/05/2017   NA 142 04/05/2017   K 3.7 04/05/2017   CL 103 04/05/2017   CO2 30 04/05/2017      Assessment & Plan:  Insulin-requiring type 2 DM: uncertain glycemic control Edema: this limits rx options Occupational status: he needs a slow-acting  QD insulin   Patient Instructions  blood tests are requested for you today.  We'll let you know about the results.  Please continue the same medications check your blood sugar twice a day.  vary the time of day when you check, between before the 3 meals, and at bedtime.  also check if you have symptoms of your blood sugar being too high or too low.  please keep a record of the readings and bring it to your next appointment here (or you can bring the meter itself).  You can write it on any piece of paper.  please call us sooner if your blood sugar goes below 70, or if you have a lot of readings over 200. Please come back for a follow-up appointment in 3 months.

## 2017-04-22 ENCOUNTER — Telehealth: Payer: Self-pay | Admitting: Nurse Practitioner

## 2017-04-22 MED ORDER — EXENATIDE ER 2 MG/0.85ML ~~LOC~~ AUIJ: 2.0000 mg | AUTO-INJECTOR | SUBCUTANEOUS | 1 refills | Status: DC

## 2017-04-22 NOTE — Telephone Encounter (Signed)
[facility] called [facility] requesting refill on bydureon bcise.  Patient uses [facility] on [address].

## 2017-04-22 NOTE — Telephone Encounter (Signed)
Rx sent 

## 2017-04-24 ENCOUNTER — Encounter: Payer: Self-pay | Admitting: *Deleted

## 2017-04-24 ENCOUNTER — Telehealth: Payer: Self-pay | Admitting: Neurology

## 2017-04-24 NOTE — Telephone Encounter (Signed)
Pt states he's had migraine since yesterday. Call transferred to RN

## 2017-04-24 NOTE — Telephone Encounter (Signed)
Pt called and stated he has had a migraine since 2 PM yesterday. He is taking his medications as prescribed. He also took a Gabapentin. He has tried to lay down and cannot sleep. He reports some neck pain but no other symptoms. He would like an infusion and then would like to try to go to work tonight if he can. Rn informed pt she would check availability for an infusion today and speak with Dr. Lucia GaskinsAhern. Will return pt's call. He was very Adult nurseappreciative.

## 2017-04-24 NOTE — Telephone Encounter (Addendum)
Spoke with Jason Ortega in infusion and pt can come anytime. Spoke with Dr. Lucia Ortega. She has authorized an infusion today of Toradol 30 mg IV x 1, Depacon 1 gram IV x 1, and Solumedrol 250 mg IV x 1. Spoke with pt re: infusion. He will come. Order form completed, signed & taken to Jason Ortega in infusion along with copy of insurance card.

## 2017-04-24 NOTE — Telephone Encounter (Signed)
Pt arrived for infusion nauseated per Inetta Fermoina. Pt now has a driver (his wife). Dr. Lucia GaskinsAhern has added a verbal order for Compazine 10 mg IV x 1. Tina aware.

## 2017-04-26 NOTE — Telephone Encounter (Signed)
Pt states he is having another migraine, not as bad as the other day but it is there when he holds his head down.  He is asking for a call back

## 2017-04-26 NOTE — Telephone Encounter (Signed)
Called the patient back. He states that he woke up this morning around 2 am with migraine. He has taking his topamax, gabapentin, and taken a imitrex. He is due for a second dose and is planning. I have encouraged him to lay and rest in  A dark room. We confirmed that he wasn't having any sinus related symptoms that could cause the headache to worsen when looking down. At this time the recommendation is just see if the 2nd imitrex works

## 2017-04-27 ENCOUNTER — Other Ambulatory Visit: Payer: Self-pay

## 2017-04-27 ENCOUNTER — Emergency Department (HOSPITAL_COMMUNITY)
Admission: EM | Admit: 2017-04-27 | Discharge: 2017-04-27 | Disposition: A | Discharge status: Home / Self Care | Payer: 59 | Attending: Emergency Medicine | Admitting: Emergency Medicine

## 2017-04-27 ENCOUNTER — Encounter (HOSPITAL_COMMUNITY): Payer: Self-pay | Admitting: *Deleted

## 2017-04-27 DIAGNOSIS — I10 Essential (primary) hypertension: Secondary | ICD-10-CM | POA: Insufficient documentation

## 2017-04-27 DIAGNOSIS — Z7982 Long term (current) use of aspirin: Secondary | ICD-10-CM | POA: Diagnosis not present

## 2017-04-27 DIAGNOSIS — R51 Headache: Secondary | ICD-10-CM | POA: Diagnosis present

## 2017-04-27 DIAGNOSIS — Z79899 Other long term (current) drug therapy: Secondary | ICD-10-CM | POA: Insufficient documentation

## 2017-04-27 DIAGNOSIS — G43901 Migraine, unspecified, not intractable, with status migrainosus: Secondary | ICD-10-CM | POA: Insufficient documentation

## 2017-04-27 DIAGNOSIS — Z794 Long term (current) use of insulin: Secondary | ICD-10-CM | POA: Diagnosis not present

## 2017-04-27 DIAGNOSIS — E119 Type 2 diabetes mellitus without complications: Secondary | ICD-10-CM | POA: Insufficient documentation

## 2017-04-27 MED ORDER — ASPIRIN-ACETAMINOPHEN-CAFFEINE 250-250-65 MG PO TABS
2.0000 | ORAL_TABLET | Freq: Once | ORAL | Status: AC
  Administered 2017-04-27: 2 via ORAL
  Filled 2017-04-27: qty 2

## 2017-04-27 MED ORDER — SUMATRIPTAN SUCCINATE 6 MG/0.5ML ~~LOC~~ SOLN
6.0000 mg | Freq: Once | SUBCUTANEOUS | Status: AC
  Administered 2017-04-27: 6 mg via SUBCUTANEOUS
  Filled 2017-04-27: qty 0.5

## 2017-04-27 MED ORDER — TRAMADOL HCL 50 MG PO TABS
50.0000 mg | ORAL_TABLET | Freq: Once | ORAL | Status: AC
Start: 2017-04-27 — End: 2017-04-27
  Administered 2017-04-27: 50 mg via ORAL
  Filled 2017-04-27: qty 1

## 2017-04-27 MED ORDER — METOCLOPRAMIDE HCL 5 MG/ML IJ SOLN
10.0000 mg | Freq: Once | INTRAMUSCULAR | Status: AC
  Administered 2017-04-27: 10 mg via INTRAMUSCULAR
  Filled 2017-04-27: qty 2

## 2017-04-27 NOTE — ED Notes (Signed)
Pt is alert and oriented x 4 and is verbally responsive.Pt reports Migraine x 2 days . Pt reports 8/10 throbbing HA to rt posterior region

## 2017-04-27 NOTE — ED Triage Notes (Signed)
Pt complains of migraine for the past 2 nights. Pt had a migraine earlier this week, went to his neurologist and received a migraine cocktail, which relieved his pain for a couple days. Pt is sensitive to light on his right side. Pt has been taking his normal migraine medications w/o relief.

## 2017-04-27 NOTE — Discharge Instructions (Signed)
It was our pleasure to provide your ER care today - we hope that you feel better.  Take your migraine medication as need.  You may also take motrin or aleve as need.   Follow up with your doctor/headache specialist Monday if symptoms fail to improve/resolve.  Return to ER if worse, new symptoms, fevers, intractable or worsening pain, other concern.   You were given pain medication in the ER - no driving for the next 4 hours.

## 2017-04-27 NOTE — ED Provider Notes (Signed)
[facility] DEPT Provider Note   CSN: [id number] Arrival date & time: 04/27/17  1038     History   Chief Complaint Chief Complaint  Patient presents with  . Migraine    HPI Jason Ortega is a 52 y.o. male.  Patient w hx migraines, c/o right sided/frontal migraine headache for the past 1-2 days. Pain dull/throbbing, moderate, persistent, non radiating. Feels same as prior migraine. Denies vomiting.  Denies eye pain or change in vision. No sinus pain or congestion. No fever or chills. No neck pain or stiffness. Denies any recent head injury or trauma. No syncope. No change in speech or vision. No numbness/weakness or change in normal/baseline functioning ability.   The history is provided by the patient.    Past Medical History:  Diagnosis Date  . Allergy   . Diabetes mellitus   . Frequent headaches   . Glaucoma   . Hypercholesteremia   . Hypertension   . Migraines   . Sleep apnea     Patient Active Problem List   Diagnosis Date Noted  . Tingling of both feet 04/08/2017  . Lightheadedness 03/26/2017  . Chronic migraine without aura, with intractable migraine, so stated, with status migrainosus 03/04/2017  . Chronic bilateral low back pain with bilateral sciatica 12/07/2016  . Type 2 diabetes mellitus without complication, without long-term current use of insulin ([place]) 06/06/2016  . Essential hypertension 06/06/2016  . Gastroesophageal reflux disease without esophagitis 06/06/2016    Past Surgical History:  Procedure Laterality Date  . CARDIAC CATHETERIZATION  12/2011  . LEFT HEART CATHETERIZATION WITH CORONARY ANGIOGRAM N/A 11/12/2011   Procedure: LEFT HEART CATHETERIZATION WITH CORONARY ANGIOGRAM;  Surgeon: Laverda Page, MD;  Location: [facility] CATH LAB;  Service: Cardiovascular;  Laterality: N/A;  . SHOULDER SURGERY          Home Medications    Prior to Admission medications   Medication Sig Start Date End Date Taking?  Authorizing Provider  aspirin 81 MG tablet Take 1 tablet (81 mg total) by mouth daily. 09/05/16   Golden Circle, FNP  atorvastatin (LIPITOR) 40 MG tablet TAKE 1 TABLET(40 MG) BY MOUTH DAILY 02/12/17   Lance Sell, NP  Blood Glucose Monitoring Suppl (ONETOUCH VERIO) w/Device KIT 1 Device by Does not apply route 2 (two) times daily as needed. 07/31/16   Plotnikov, Evie Lacks, MD  dapagliflozin propanediol (FARXIGA) 5 MG TABS tablet Take 5 mg by mouth daily. 04/05/17   Lance Sell, NP  Exenatide ER (BYDUREON BCISE) 2 MG/0.85ML AUIJ Inject 2 mg into the skin once a week. 04/22/17   Lance Sell, NP  Fremanezumab-vfrm (AJOVY) 225 MG/1.5ML SOSY Inject 225 mg into the skin every 30 (thirty) days. 03/04/17   Melvenia Beam, MD  gabapentin (NEURONTIN) 600 MG tablet Take 600 mg by mouth 2 (two) times daily.    [provider]  insulin degludec (TRESIBA FLEXTOUCH) 100 UNIT/ML SOPN FlexTouch Pen Inject 0.2 mLs (20 Units total) into the skin daily. And pen needles 1/day 04/08/17   Renato Shin, MD  Insulin Pen Needle 32G X 4 MM MISC Used to inject insulin one time daily. 02/08/17   Renato Shin, MD  losartan-hydrochlorothiazide (HYZAAR) 100-25 MG tablet Take 1 tablet by mouth daily. 02/12/17   Lance Sell, NP  metFORMIN (GLUCOPHAGE) 1000 MG tablet Take 1 tablet (1,000 mg total) by mouth 2 (two) times daily with a meal. 02/15/17   Shambley, Delphia Grates, NP  methocarbamol (ROBAXIN) 500 MG  tablet Take 1 tablet (500 mg total) by mouth 2 (two) times daily. 02/15/17   Lance Sell, NP  omeprazole (PRILOSEC) 40 MG capsule Take 1 capsule (40 mg total) by mouth daily as needed (indigestion). 02/12/17   Lance Sell, NP  SUMAtriptan (IMITREX) 100 MG tablet Take 1 tablet (100 mg total) by mouth once as needed for up to 1 dose. May repeat in 2 hours if headache persists or recurs. 03/04/17   Melvenia Beam, MD  timolol (TIMOPTIC-XR) 0.5 % ophthalmic gel-forming Place 1 drop into  both eyes daily.  08/10/16   [provider]  topiramate (TOPAMAX) 100 MG tablet Take 1 tablet (100 mg total) by mouth daily. 03/04/17   Melvenia Beam, MD  verapamil (CALAN-SR) 180 MG CR tablet Take 1 tablet (180 mg total) by mouth daily. 02/12/17   Lance Sell, NP    Family History Family History  Problem Relation Age of Onset  . Arthritis Mother   . Stroke Mother   . Hypertension Father   . Diabetes Father   . Cancer Father   . Diabetes Maternal Grandfather     Social History Social History   Tobacco Use  . Smoking status: Never Smoker  . Smokeless tobacco: Never Used  Substance Use Topics  . Alcohol use: Yes    Comment: occasional  . Drug use: No     Allergies   Patient has no known allergies.   Review of Systems Review of Systems  Constitutional: Negative for fever.  HENT: Negative for sinus pain and sore throat.   Eyes: Negative for pain and visual disturbance.  Respiratory: Negative for shortness of breath.   Cardiovascular: Negative for chest pain.  Gastrointestinal: Negative for abdominal pain.  Genitourinary: Negative for flank pain.  Musculoskeletal: Negative for neck pain and neck stiffness.  Skin: Negative for rash.  Neurological: Positive for headaches. Negative for speech difficulty, weakness and numbness.  Hematological: Does not bruise/bleed easily.  Psychiatric/Behavioral: Negative for confusion.     Physical Exam Updated Vital Signs BP (!) 147/84 (BP Location: Right Arm)   Pulse 83   Temp 98 F (36.7 C) (Oral)   Resp 17   SpO2 96%   Physical Exam  Constitutional: He is oriented to person, place, and time. He appears well-developed and well-nourished. No distress.  HENT:  Head: Atraumatic.  Mouth/Throat: Oropharynx is clear and moist.  No sinus or temporal tenderness.   Eyes: Conjunctivae and EOM are normal.  Right pupil irregular (remote hx cataracts/surgery)/   Neck: Normal range of motion. Neck supple. No tracheal  deviation present.  No stiffness or rigidity  Cardiovascular: Normal rate, regular rhythm, normal heart sounds and intact distal pulses.  Pulmonary/Chest: Effort normal and breath sounds normal. No accessory muscle usage. No respiratory distress.  Abdominal: He exhibits no distension.  Musculoskeletal: He exhibits no edema.  Neurological: He is alert and oriented to person, place, and time.  Speech normal/fluent. Motor intact bil, stre 5/5. No pronator drift. sens grossly intact. Steady gait.   Skin: Skin is warm and dry.  Psychiatric: He has a normal mood and affect.  Nursing note and vitals reviewed.    ED Treatments / Results  Labs (all labs ordered are listed, but only abnormal results are displayed) Labs Reviewed - No data to display  EKG None  Radiology No results found.  Procedures Procedures (including critical care time)  Medications Ordered in ED Medications  SUMAtriptan (IMITREX) injection 6 mg (has no administration  in time range)  aspirin-acetaminophen-caffeine (EXCEDRIN MIGRAINE) per tablet 2 tablet (has no administration in time range)     Initial Impression / Assessment and Plan / ED Course  I have reviewed the triage vital signs and the nursing notes.  Pertinent labs & imaging results that were available during my care of the patient were reviewed by me and considered in my medical decision making (see chart for details).  imitrex sq. excedrin po.   Reviewed nursing notes and prior charts for additional history.  Prior imaging studies/mri to evaluate for migraines/headaches was neg then.   Recheck headache sl improved but persists.   reglan 10 mg. Ultram.   Recheck, pt comfortable appearing. No nv. Feels improved.  Discussed neurology/migraine f/u - pt indicates he has a neurologist/headache specialist w whom he can f/u.      Final Clinical Impressions(s) / ED Diagnoses   Final diagnoses:  None    ED Discharge Orders    None         Lajean Saver, MD 04/27/17 1511

## 2017-04-30 ENCOUNTER — Encounter: Payer: Self-pay | Admitting: Nurse Practitioner

## 2017-04-30 NOTE — Telephone Encounter (Signed)
[facility] Pt said he took ibuprofen 600mg  on Sunday which stopped the HA.  FYI

## 2017-05-06 ENCOUNTER — Encounter: Payer: Self-pay | Admitting: Neurology

## 2017-05-06 MED ORDER — IBUPROFEN 600 MG PO TABS
600.0000 mg | ORAL_TABLET | Freq: Three times a day (TID) | ORAL | 0 refills | Status: DC | PRN
Start: 2017-05-06 — End: 2018-03-03

## 2017-05-15 ENCOUNTER — Telehealth: Payer: Self-pay | Admitting: Endocrinology

## 2017-05-15 NOTE — Telephone Encounter (Signed)
Patient would like to see If he can reduce he medications that he has been taking.  He says his Blood sugars are running lower and stated he can also bring readings in for doctor to see from the last month where they have been reading lower.  Please advise

## 2017-05-15 NOTE — Telephone Encounter (Signed)
Thanks, but I need more detail about cbg's

## 2017-05-16 ENCOUNTER — Other Ambulatory Visit: Payer: Self-pay

## 2017-05-16 ENCOUNTER — Encounter: Payer: Self-pay | Admitting: Endocrinology

## 2017-05-16 MED ORDER — ASPIRIN 81 MG PO TABS: 81.0000 mg | ORAL_TABLET | Freq: Every day | ORAL | 1 refills | Status: DC

## 2017-05-16 NOTE — Telephone Encounter (Signed)
Spoke with the patient and he gave me the readings for the last 3 days: 05/14/17 2:30 am- 81 7:02 am-105 3:27 pm-115 7:22pm- 140 10:00 pm- 79   05/15/17 2:30 am-127 8:25 am-105 1:38 pm- 73 3:30 pm- 170 10:00 pm- 91  05/16/17 4:16 am- 96 6:48 am- 97 2:02 pm- 86 4:02 pm- 74  Please advise if there needs to be any changes in medication

## 2017-05-16 NOTE — Telephone Encounter (Signed)
Please verify insulin is 20 units qd Then please reduce to 10 units qd.

## 2017-05-17 NOTE — Telephone Encounter (Signed)
I called patient & he stated that he was taking the tresiba 20 units twice daily. He thought he was to take it 2x daily like his metformin. I stated that he should resume the 20 units once a day until hearing from me again. Please advise?

## 2017-05-17 NOTE — Telephone Encounter (Signed)
Ok, so please reduce to 20 units qd.

## 2017-05-20 ENCOUNTER — Other Ambulatory Visit: Payer: Self-pay

## 2017-05-20 DIAGNOSIS — E119 Type 2 diabetes mellitus without complications: Secondary | ICD-10-CM

## 2017-05-20 MED ORDER — DAPAGLIFLOZIN PROPANEDIOL 5 MG PO TABS: 5.0000 mg | ORAL_TABLET | Freq: Every day | ORAL | 0 refills | Status: DC

## 2017-05-21 NOTE — Telephone Encounter (Signed)
LVM that patient should just be taking [place]iba the 20 units qd. I asked that if he had further questions he could call back.

## 2017-05-22 ENCOUNTER — Other Ambulatory Visit: Payer: Self-pay | Admitting: Nurse Practitioner

## 2017-05-22 NOTE — Telephone Encounter (Signed)
Copied from CRM [id number]. Topic: Quick Communication - Rx Refill/Question >> May 22, 2017  8:52 AM Percival SpanishKennedy, Cheryl W wrote: Medication  dapagliflozin propanediol (FARXIGA) 5 MG TABS tablet     rx was sent on 05/20/17 but pharmacy told pt they dont have it, resend please     Has the patient contacted their pharmacy yes   Preferred Pharmacy  [facility]  at [address]   Agent: Please be advised that RX refills may take up to 3 business days. We ask that you follow-up with your pharmacy.

## 2017-05-22 NOTE — Telephone Encounter (Signed)
[id number] [place], [place] - [address]  [address]  [place] [id number]  Phone: [phone] Fax: [phone]   [facility] [address] [facility]. His insurance is aware he wants the [place] to be a 90 days supply. Out of medication.

## 2017-05-22 NOTE — Telephone Encounter (Signed)
Called [facility] at [address] Pharmacist stated that pt that he does have refills but his insurance will not cover the med to be filled except at a mail order pharmacy. Pharmacist stated that pt would need to call his insurance company. Pt called and notified.

## 2017-05-23 ENCOUNTER — Telehealth: Payer: Self-pay | Admitting: Nurse Practitioner

## 2017-05-23 DIAGNOSIS — E119 Type 2 diabetes mellitus without complications: Secondary | ICD-10-CM

## 2017-05-23 MED ORDER — DAPAGLIFLOZIN PROPANEDIOL 5 MG PO TABS: 5.0000 mg | ORAL_TABLET | Freq: Every day | ORAL | 0 refills | Status: DC

## 2017-05-23 NOTE — Telephone Encounter (Signed)
Patient called, left VM to call the office back about his prescription request.

## 2017-05-23 NOTE — Telephone Encounter (Signed)
Copied from CRM [id number]. Topic: General - Other >> May 23, 2017  8:57 AM Percival SpanishKennedy, Cheryl W wrote:  [facility] [address] [facility] 90 day supply. This requires a new script.   dapagliflozin propanediol (FARXIGA) 5 MG TABS tablet  [facility] [address]

## 2017-05-23 NOTE — Telephone Encounter (Signed)
Attempted to contact pt regarding prescription; left message on [phone].

## 2017-05-23 NOTE — Addendum Note (Signed)
Addended by: Mercer PodWRENN, Tyrea Froberg E on: 05/23/2017 11:36 AM   Modules accepted: Orders

## 2017-05-27 ENCOUNTER — Other Ambulatory Visit: Payer: Self-pay

## 2017-05-27 MED ORDER — LOSARTAN POTASSIUM-HCTZ 100-25 MG PO TABS: 1.0000 | ORAL_TABLET | Freq: Every day | ORAL | 1 refills | Status: DC

## 2017-06-17 ENCOUNTER — Telehealth: Payer: Self-pay | Admitting: Endocrinology

## 2017-06-17 ENCOUNTER — Other Ambulatory Visit: Payer: Self-pay

## 2017-06-17 ENCOUNTER — Other Ambulatory Visit: Payer: Self-pay | Admitting: *Deleted

## 2017-06-17 ENCOUNTER — Telehealth: Payer: Self-pay | Admitting: Nurse Practitioner

## 2017-06-17 MED ORDER — METFORMIN HCL 1000 MG PO TABS: 1000.0000 mg | ORAL_TABLET | Freq: Two times a day (BID) | ORAL | 0 refills | Status: DC

## 2017-06-17 MED ORDER — METFORMIN HCL 1000 MG PO TABS: 1000.0000 mg | ORAL_TABLET | Freq: Two times a day (BID) | ORAL | 1 refills | Status: DC

## 2017-06-17 MED ORDER — INSULIN DEGLUDEC 100 UNIT/ML ~~LOC~~ SOPN: 20.0000 [IU] | PEN_INJECTOR | Freq: Every day | SUBCUTANEOUS | 11 refills | Status: DC

## 2017-06-17 NOTE — Telephone Encounter (Signed)
Copied from CRM [id number]. Topic: Quick Communication - Rx Refill/Question >> Jun 17, 2017  9:14 AM Jolayne Haines L wrote: Medication: metFORMIN (GLUCOPHAGE) 1000 MG tablet & Exenatide ER (BYDUREON BCISE) 2 MG/0.85ML AUIJ. Has the patient contacted their pharmacy? yes (Agent: If no, request that the patient contact the pharmacy for the refill.) Preferred Pharmacy (with phone number or street name): [id number] [place] [place] - [address] Agent: Please be advised that RX refills may take up to 3 business days. We ask that you follow-up with your pharmacy.

## 2017-06-17 NOTE — Telephone Encounter (Signed)
Pt request refill for metformin 1000 mg tablet, last refill was 02/15/17 Refill done, pharmacy changed per patient request for this med. Too soon to refill Exenatide ER, has one more refill at [facility] that he is going to pick up. Last refill 04/22/17. Provider: Alphonse Guild, NP LOV  04/05/17 NOV  11/04/17

## 2017-06-17 NOTE — Telephone Encounter (Signed)
insulin degludec (TRESIBA FLEXTOUCH) 100 UNIT/ML SOPN FlexTouch Pen    Patient needs a refill sent into the pharmacy for the above prescription He states he was taking 10 units but needed it changed to 20 per the dr. When I looked at the script it is already stating 20 units.     [id number] [place],  - [address]

## 2017-06-17 NOTE — Telephone Encounter (Signed)
I have sent to patient's requested pharmacy.  

## 2017-06-17 NOTE — Telephone Encounter (Signed)
Reviewed chart pt is up-to-date [facility]  

## 2017-06-20 ENCOUNTER — Telehealth: Payer: Self-pay | Admitting: *Deleted

## 2017-06-20 ENCOUNTER — Encounter: Payer: Self-pay | Admitting: *Deleted

## 2017-06-20 NOTE — Telephone Encounter (Signed)
Patient has taken 3 doses of Ajovy. He reports that it has decreased the number of migraine days per month. He did ask if he could potentially switch to Aimovig because he has trouble giving himself the injection and requires his wife to give it to him. RN informed pt that she would ask Dr. Lucia Gaskins. He verbalized appreciation.   Ajovy PA continuation form completed and ready for Dr. Trevor Mace signature.

## 2017-06-20 NOTE — Telephone Encounter (Signed)
Spoke with Dr. Lucia Gaskins. At this time since the medication is working for patient and difficulty of getting Aimovig approved by [facility], will suggest to patient to continue the medication for now. Received a fax that Ajovy has been approved through 06/21/2018.   Called patient and LVM informing patient that at this time Dr. Lucia Gaskins really doesn't want to change his medication since it is working for him and approval by insurance may be difficult. Also informed pt that Ajovy has now been approved for another year. Asked pt to think about it and give [place] a call back with any questions or concerns. Left office number in message.

## 2017-06-20 NOTE — Telephone Encounter (Signed)
Called [facility] and spoke with certified rep. She will fax the form for Ajovy form for PA for continuation request.

## 2017-06-20 NOTE — Telephone Encounter (Signed)
error 

## 2017-06-20 NOTE — Telephone Encounter (Signed)
Faxed signed PA to [facility]. Received a receipt of confirmation.

## 2017-06-20 NOTE — Telephone Encounter (Signed)
Called patient and LVM (ok per DPR) asking for call back to discuss questions on the Ajovy continuation form.

## 2017-06-24 ENCOUNTER — Other Ambulatory Visit: Payer: Self-pay

## 2017-06-24 ENCOUNTER — Telehealth: Payer: Self-pay | Admitting: Endocrinology

## 2017-06-24 MED ORDER — EXENATIDE ER 2 MG/0.85ML ~~LOC~~ AUIJ: 2.0000 mg | AUTO-INJECTOR | SUBCUTANEOUS | 1 refills | Status: DC

## 2017-06-24 MED ORDER — INSULIN DEGLUDEC 100 UNIT/ML ~~LOC~~ SOPN: 20.0000 [IU] | PEN_INJECTOR | Freq: Every day | SUBCUTANEOUS | 11 refills | Status: DC

## 2017-06-24 NOTE — Telephone Encounter (Signed)
Patient stated he needs a 90 day supply of [place] and Bydureon send to  [facility] [id number] - [place], [place] - [address] AT [facility] [address] [facility] [address] [place] #:  [phone]

## 2017-06-24 NOTE — Telephone Encounter (Signed)
This has been sent

## 2017-07-02 ENCOUNTER — Other Ambulatory Visit: Payer: Self-pay

## 2017-07-02 DIAGNOSIS — I10 Essential (primary) hypertension: Secondary | ICD-10-CM

## 2017-07-02 MED ORDER — VERAPAMIL HCL ER 180 MG PO TBCR: 180.0000 mg | EXTENDED_RELEASE_TABLET | Freq: Every day | ORAL | 0 refills | Status: DC

## 2017-07-03 ENCOUNTER — Encounter: Payer: Self-pay | Admitting: Neurology

## 2017-07-03 ENCOUNTER — Ambulatory Visit (INDEPENDENT_AMBULATORY_CARE_PROVIDER_SITE_OTHER): Payer: 59 | Admitting: Neurology

## 2017-07-03 VITALS — BP 124/77 | HR 72 | Ht 68.0 in | Wt 225.6 lb

## 2017-07-03 DIAGNOSIS — G43711 Chronic migraine without aura, intractable, with status migrainosus: Secondary | ICD-10-CM | POA: Diagnosis not present

## 2017-07-03 MED ORDER — HYDROCODONE-ACETAMINOPHEN 10-325 MG PO TABS: 1.0000 | ORAL_TABLET | ORAL | 0 refills | Status: AC | PRN

## 2017-07-03 MED ORDER — ERENUMAB-AOOE 140 MG/ML ~~LOC~~ SOAJ: 140.0000 mg | SUBCUTANEOUS | 11 refills | Status: DC

## 2017-07-03 MED ORDER — TOPIRAMATE 100 MG PO TABS: 100.0000 mg | ORAL_TABLET | Freq: Every day | ORAL | 11 refills | Status: DC

## 2017-07-03 MED ORDER — SUMATRIPTAN SUCCINATE 100 MG PO TABS: 100.0000 mg | ORAL_TABLET | Freq: Once | ORAL | 12 refills | Status: DC | PRN
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
## 2017-07-03 NOTE — Patient Instructions (Signed)
Acetaminophen; Hydrocodone tablets or capsules What is this medicine? ACETAMINOPHEN; HYDROCODONE (a set a MEE noe fen; hye droe KOE done) is a pain reliever. It is used to treat moderate to severe pain. This medicine may be used for other purposes; ask your health care provider or pharmacist if you have questions. COMMON BRAND NAME(S): Anexsia, Bancap HC, Ceta-Plus, Co-Gesic, Comfortpak, Dolagesic, Dolorex Forte, DuoCet, Hydrocet, Hydrogesic, Lorcet, Lorcet HD, Lorcet Plus, Lortab, Margesic H, Maxidone, Norco, Polygesic, Stagesic, Vanacet, Verdrocet, Vicodin, Vicodin ES, Vicodin HP, Xodol, Zydone What should I tell my health care provider before I take this medicine? They need to know if you have any of these conditions: -brain tumor -Crohn's disease, inflammatory bowel disease, or ulcerative colitis -drug abuse or addiction -head injury -heart or circulation problems -if you often drink alcohol -kidney disease or problems going to the bathroom -liver disease -lung disease, asthma, or breathing problems -an unusual or allergic reaction to acetaminophen, hydrocodone, other opioid analgesics, other medicines, foods, dyes, or preservatives -pregnant or trying to get pregnant -breast-feeding How should I use this medicine? Take this medicine by mouth with a glass of water. Follow the directions on the prescription label. You can take it with or without food. If it upsets your stomach, take it with food. Do not take your medicine more often than directed. A special MedGuide will be given to you by the pharmacist with each prescription and refill. Be sure to read this information carefully each time. Talk to your pediatrician regarding the use of this medicine in children. Special care may be needed. Overdosage: If you think you have taken too much of this medicine contact a poison control center or emergency room at once. NOTE: This medicine is only for you. Do not share this medicine with  others. What if I miss a dose? If you miss a dose, take it as soon as you can. If it is almost time for your next dose, take only that dose. Do not take double or extra doses. What may interact with this medicine? This medicine may interact with the following medications: -alcohol -antiviral medicines for HIV or AIDS -atropine -antihistamines for allergy, cough and cold -certain antibiotics like erythromycin, clarithromycin -certain medicines for anxiety or sleep -certain medicines for bladder problems like oxybutynin, tolterodine -certain medicines for depression like amitriptyline, fluoxetine, sertraline -certain medicines for fungal infections like ketoconazole and itraconazole -certain medicines for Parkinson's disease like benztropine, trihexyphenidyl -certain medicines for seizures like carbamazepine, phenobarbital, phenytoin, primidone -certain medicines for stomach problems like dicyclomine, hyoscyamine -certain medicines for travel sickness like scopolamine -general anesthetics like halothane, isoflurane, methoxyflurane, propofol -ipratropium -local anesthetics like lidocaine, pramoxine, tetracaine -MAOIs like Carbex, Eldepryl, Marplan, Nardil, and Parnate -medicines that relax muscles for surgery -other medicines with acetaminophen -other narcotic medicines for pain or cough -phenothiazines like chlorpromazine, mesoridazine, prochlorperazine, thioridazine -rifampin This list may not describe all possible interactions. Give your health care provider a list of all the medicines, herbs, non-prescription drugs, or dietary supplements you use. Also tell them if you smoke, drink alcohol, or use illegal drugs. Some items may interact with your medicine. What should I watch for while using this medicine? Tell your doctor or health care professional if your pain does not go away, if it gets worse, or if you have new or a different type of pain. You may develop tolerance to the medicine.  Tolerance means that you will need a higher dose of the medicine for pain relief. Tolerance is normal and is expected if   you take the medicine for a long time. Do not suddenly stop taking your medicine because you may develop a severe reaction. Your body becomes used to the medicine. This does NOT mean you are addicted. Addiction is a behavior related to getting and using a drug for a non-medical reason. If you have pain, you have a medical reason to take pain medicine. Your doctor will tell you how much medicine to take. If your doctor wants you to stop the medicine, the dose will be slowly lowered over time to avoid any side effects. There are different types of narcotic medicines (opiates). If you take more than one type at the same time or if you are taking another medicine that also causes drowsiness, you may have more side effects. Give your health care provider a list of all medicines you use. Your doctor will tell you how much medicine to take. Do not take more medicine than directed. Call emergency for help if you have problems breathing or unusual sleepiness. Do not take other medicines that contain acetaminophen with this medicine. Always read labels carefully. If you have questions, ask your doctor or pharmacist. If you take too much acetaminophen get medical help right away. Too much acetaminophen can be very dangerous and cause liver damage. Even if you do not have symptoms, it is important to get help right away. You may get drowsy or dizzy. Do not drive, use machinery, or do anything that needs mental alertness until you know how this medicine affects you. Do not stand or sit up quickly, especially if you are an older patient. This reduces the risk of dizzy or fainting spells. Alcohol may interfere with the effect of this medicine. Avoid alcoholic drinks. The medicine will cause constipation. Try to have a bowel movement at least every 2 to 3 days. If you do not have a bowel movement for 3  days, call your doctor or health care professional. Your mouth may get dry. Chewing sugarless gum or sucking hard candy, and drinking plenty of water may help. Contact your doctor if the problem does not go away or is severe. What side effects may I notice from receiving this medicine? Side effects that you should report to your doctor or health care professional as soon as possible: -allergic reactions like skin rash, itching or hives, swelling of the face, lips, or tongue -breathing problems -confusion -redness, blistering, peeling or loosening of the skin, including inside the mouth -signs and symptoms of low blood pressure like dizziness; feeling faint or lightheaded, falls; unusually weak or tired -trouble passing urine or change in the amount of urine -yellowing of the eyes or skin Side effects that usually do not require medical attention (report to your doctor or health care professional if they continue or are bothersome): -constipation -dry mouth -nausea, vomiting -tiredness This list may not describe all possible side effects. Call your doctor for medical advice about side effects. You may report side effects to FDA at 1-800-FDA-1088. Where should I keep my medicine? Keep out of the reach of children. This medicine can be abused. Keep your medicine in a safe place to protect it from theft. Do not share this medicine with anyone. Selling or giving away this medicine is dangerous and against the law. This medicine may cause accidental overdose and death if it taken by other adults, children, or pets. Mix any unused medicine with a substance like cat litter or coffee grounds. Then throw the medicine away in a sealed container like   a sealed bag or a coffee can with a lid. Do not use the medicine after the expiration date. Store at room temperature between 15 and 30 degrees C (59 and 86 degrees F). NOTE: This sheet is a summary. It may not cover all possible information. If you have  questions about this medicine, talk to your doctor, pharmacist, or health care provider.  2018 Elsevier/Gold Standard (2014-10-15 10:02:16)  

## 2017-07-04 ENCOUNTER — Ambulatory Visit (INDEPENDENT_AMBULATORY_CARE_PROVIDER_SITE_OTHER): Payer: 59 | Admitting: Endocrinology

## 2017-07-04 ENCOUNTER — Encounter: Payer: Self-pay | Admitting: Endocrinology

## 2017-07-04 VITALS — BP 122/70 | HR 84 | Ht 68.0 in | Wt 222.0 lb

## 2017-07-04 DIAGNOSIS — E119 Type 2 diabetes mellitus without complications: Secondary | ICD-10-CM | POA: Diagnosis not present

## 2017-07-04 LAB — POCT GLYCOSYLATED HEMOGLOBIN (HGB A1C): Hemoglobin A1C: 7.4 % — AB (ref 4.0–5.6)

## 2017-07-04 NOTE — Progress Notes (Signed)
Subjective:    Patient ID: Jason Ortega, male    DOB: Jul 30, 1965, 52 y.o.   MRN: [id number]  HPI Pt returns for f/u of diabetes mellitus: DM type: Insulin-requiring type 2 Dx'ed: 4742 Complications: polyneuropathy Therapy: insulin since 2019, bydureon, and 2 oral meds DKA: never Severe hypoglycemia: never Pancreatitis: never Pancreatic imaging:  Other: he works 3rd shift, in a warehouse; edema limits rx options; he declines multiple daily injections.   Interval history: He brings a record of his cbg's which I have reviewed today.  It varies from 77-258.  There is no trend throughout the day.   Past Medical History:  Diagnosis Date  . Allergy   . Diabetes mellitus   . Frequent headaches   . Glaucoma   . Hypercholesteremia   . Hypertension   . Migraines   . Sleep apnea     Past Surgical History:  Procedure Laterality Date  . CARDIAC CATHETERIZATION  12/2011  . LEFT HEART CATHETERIZATION WITH CORONARY ANGIOGRAM N/A 11/12/2011   Procedure: LEFT HEART CATHETERIZATION WITH CORONARY ANGIOGRAM;  Surgeon: Laverda Page, MD;  Location: [facility] CATH LAB;  Service: Cardiovascular;  Laterality: N/A;  . SHOULDER SURGERY      Social History   Socioeconomic History  . Marital status: Married    Spouse name: Not on file  . Number of children: 2  . Years of education: 7  . Highest education level: Not on file  Occupational History  . Occupation: Clinical research associate  Social Needs  . Financial resource strain: Not on file  . Food insecurity:    Worry: Not on file    Inability: Not on file  . Transportation needs:    Medical: Not on file    Non-medical: Not on file  Tobacco Use  . Smoking status: Never Smoker  . Smokeless tobacco: Never Used  Substance and Sexual Activity  . Alcohol use: Yes    Comment: occasional  . Drug use: No  . Sexual activity: Yes    Birth control/protection: None  Lifestyle  . Physical activity:    Days per week: Not on file    Minutes per session: Not on  file  . Stress: Not on file  Relationships  . Social connections:    Talks on phone: Not on file    Gets together: Not on file    Attends religious service: Not on file    Active member of club or organization: Not on file    Attends meetings of clubs or organizations: Not on file    Relationship status: Not on file  . Intimate partner violence:    Fear of current or ex partner: Not on file    Emotionally abused: Not on file    Physically abused: Not on file    Forced sexual activity: Not on file  Other Topics Concern  . Not on file  Social History Narrative   Fun/Hobby: Coach football    Current Outpatient Medications on File Prior to Visit  Medication Sig Dispense Refill  . aspirin 81 MG tablet Take 1 tablet (81 mg total) by mouth daily. 90 tablet 1  . atorvastatin (LIPITOR) 40 MG tablet TAKE 1 TABLET(40 MG) BY MOUTH DAILY 90 tablet 1  . Blood Glucose Monitoring Suppl (ONETOUCH VERIO) w/Device KIT 1 Device by Does not apply route 2 (two) times daily as needed. 1 kit 0  . dapagliflozin propanediol (FARXIGA) 5 MG TABS tablet Take 5 mg by mouth daily. 90 tablet 0  . Exenatide  ER (BYDUREON BCISE) 2 MG/0.85ML AUIJ Inject 2 mg into the skin once a week. 12 pen 1  . gabapentin (NEURONTIN) 600 MG tablet Take 600 mg by mouth 2 (two) times daily.    [name] HYDROcodone-acetaminophen (NORCO) 10-325 MG tablet Take 1 tablet by mouth every 4 (four) hours as needed. 45 tablet 0  . ibuprofen (ADVIL,MOTRIN) 600 MG tablet Take 1 tablet (600 mg total) by mouth every 8 (eight) hours as needed. 30 tablet 0  . insulin degludec (TRESIBA FLEXTOUCH) 100 UNIT/ML SOPN FlexTouch Pen Inject 0.2 mLs (20 Units total) into the skin daily. And pen needles 1/day 5 pen 11  . Insulin Pen Needle 32G X 4 MM MISC Used to inject insulin one time daily. 100 each 11  . losartan-hydrochlorothiazide (HYZAAR) 100-25 MG tablet Take 1 tablet by mouth daily. 90 tablet 1  . metFORMIN (GLUCOPHAGE) 1000 MG tablet Take 1 tablet (1,000 mg  total) by mouth 2 (two) times daily with a meal. 180 tablet 1  . methocarbamol (ROBAXIN) 500 MG tablet Take 1 tablet (500 mg total) by mouth 2 (two) times daily. 20 tablet 0  . omeprazole (PRILOSEC) 40 MG capsule Take 1 capsule (40 mg total) by mouth daily as needed (indigestion). 90 capsule 1  . SUMAtriptan (IMITREX) 100 MG tablet Take 1 tablet (100 mg total) by mouth once as needed for up to 1 dose. May repeat in 2 hours if headache persists or recurs. 10 tablet 12  . timolol (TIMOPTIC-XR) 0.5 % ophthalmic gel-forming Place 1 drop into both eyes daily.     [name] topiramate (TOPAMAX) 100 MG tablet Take 1 tablet (100 mg total) by mouth daily. 30 tablet 11  . verapamil (CALAN-SR) 180 MG CR tablet Take 1 tablet (180 mg total) by mouth daily. 90 tablet 0   No current facility-administered medications on file prior to visit.     No Known Allergies  Family History  Problem Relation Age of Onset  . Arthritis Mother   . Stroke Mother   . Hypertension Father   . Diabetes Father   . Cancer Father   . Diabetes Maternal Grandfather     BP 122/70 (BP Location: Left Arm, Patient Position: Sitting, Cuff Size: Normal)   Pulse 84   Ht '5\' 8"'$  (1.727 m)   Wt 222 lb (100.7 kg)   SpO2 96%   BMI 33.75 kg/m    Review of Systems No recent hypoglycemia.     Objective:   Physical Exam VITAL SIGNS:  See vs page.  GENERAL: no distress.  Pulses: dorsalis pedis intact bilat.   MSK: no deformity of the feet.  CV: 1+ bilat leg edema.  Skin:  no ulcer on the feet, but the skin is dry.  normal color and temp on the feet. Neuro: sensation is intact to touch on the feet, but decreased from normal.    Lab Results  Component Value Date   HGBA1C 7.4 (A) 07/04/2017      Assessment & Plan:  Insulin-requiring type 2 DM: this is the best control this pt should aim for, given this regimen, which does match insulin to his changing needs throughout the day.   Patient Instructions  Please continue the same  medications check your blood sugar twice a day.  vary the time of day when you check, between before the 3 meals, and at bedtime.  also check if you have symptoms of your blood sugar being too high or too low.  please keep a record of  the readings and bring it to your next appointment here (or you can bring the meter itself).  You can write it on any piece of paper.  please call us sooner if your blood sugar goes below 70, or if you have a lot of readings over 200. Please come back for a follow-up appointment in 4 months.

## 2017-07-04 NOTE — Patient Instructions (Addendum)
Please continue the same medications.   check your blood sugar twice a day.  vary the time of day when you check, between before the 3 meals, and at bedtime.  also check if you have symptoms of your blood sugar being too high or too low.  please keep a record of the readings and bring it to your next appointment here (or you can bring the meter itself).  You can write it on any piece of paper.  please call us sooner if your blood sugar goes below 70, or if you have a lot of readings over 200.   Please come back for a follow-up appointment in 4 months.    

## 2017-07-15 ENCOUNTER — Ambulatory Visit: Payer: 59 | Admitting: Nurse Practitioner

## 2017-07-15 DIAGNOSIS — Z0289 Encounter for other administrative examinations: Secondary | ICD-10-CM

## 2017-07-16 ENCOUNTER — Ambulatory Visit (INDEPENDENT_AMBULATORY_CARE_PROVIDER_SITE_OTHER): Payer: 59 | Admitting: Nurse Practitioner

## 2017-07-16 ENCOUNTER — Encounter: Payer: Self-pay | Admitting: Nurse Practitioner

## 2017-07-16 VITALS — BP 146/78 | HR 78 | Temp 98.7°F | Resp 16 | Ht 68.0 in | Wt 227.0 lb

## 2017-07-16 DIAGNOSIS — F419 Anxiety disorder, unspecified: Secondary | ICD-10-CM | POA: Diagnosis not present

## 2017-07-16 DIAGNOSIS — F329 Major depressive disorder, single episode, unspecified: Secondary | ICD-10-CM | POA: Diagnosis not present

## 2017-07-16 DIAGNOSIS — I10 Essential (primary) hypertension: Secondary | ICD-10-CM

## 2017-07-16 MED ORDER — SERTRALINE HCL 50 MG PO TABS: 50.0000 mg | ORAL_TABLET | Freq: Every day | ORAL | 1 refills | Status: DC

## 2017-07-16 NOTE — Assessment & Plan Note (Addendum)
BP slightly elevated today, states he took his BP medications later this morning, just prior to appointment RTC in 1 month for F/U- recheck BP

## 2017-07-16 NOTE — Patient Instructions (Addendum)
I have sent a prescription for zoloft 50mg  tablets to your pharmacy. Please start 1/2 tablet once daily for 1 week and then increase to a full tablet once daily on week two as tolerated.  Some side effects such as nausea, drowsiness and weight gain can occur.  Also rarely people have experienced suicidal thoughts when taking this medication.  Please discontinue the medication and go directly to ED if this occurs.  I will see you back at your physical to see how you are doing.   Mindfulness-Based Stress Reduction Mindfulness-based stress reduction (MBSR) is a program that helps people learn to practice mindfulness. Mindfulness is the practice of intentionally paying attention to the present moment. It can be learned and practiced through techniques such as education, breathing exercises, meditation, and yoga. MBSR includes several mindfulness techniques in one program. MBSR works best when you understand the treatment, are willing to try new things, and can commit to spending time practicing what you learn. MBSR training may include learning about:  How your emotions, thoughts, and reactions affect your body.  New ways to respond to things that cause negative thoughts to start (triggers).  How to notice your thoughts and let go of them.  Practicing awareness of everyday things that you normally do without thinking.  The techniques and goals of different types of meditation.  What are the benefits of MBSR? MBSR can have many benefits, which include helping you to:  Develop self-awareness. This refers to knowing and understanding yourself.  Learn skills and attitudes that help you to participate in your own health care.  Learn new ways to care for yourself.  Be more accepting about how things are, and let things go.  Be less judgmental and approach things with an open mind.  Be patient with yourself and trust yourself more.  MBSR has also been shown to:  Reduce negative emotions, such  as depression and anxiety.  Improve memory and focus.  Change how you sense and approach pain.  Boost your body's ability to fight infections.  Help you connect better with other people.  Improve your sense of well-being.  Follow these instructions at home:  Find a local in-person or online MBSR program.  Set aside some time regularly for mindfulness practice.  Find a mindfulness practice that works best for you. This may include one or more of the following: ? Meditation. Meditation involves focusing your mind on a certain thought or activity. ? Breathing awareness exercises. These help you to stay present by focusing on your breath. ? Body scan. For this practice, you lie down and pay attention to each part of your body from head to toe. You can identify tension and soreness and intentionally relax parts of your body. ? Yoga. Yoga involves stretching and breathing, and it can improve your ability to move and be flexible. It can also provide an experience of testing your body's limits, which can help you release stress. ? Mindful eating. This way of eating involves focusing on the taste, texture, color, and smell of each bite of food. Because this slows down eating and helps you feel full sooner, it can be an important part of a weight-loss plan.  Find a podcast or recording that provides guidance for breathing awareness, body scan, or meditation exercises. You can listen to these any time when you have a free moment to rest without distractions.  Follow your treatment plan as told by your health care provider. This may include taking regular medicines and  making changes to your diet or lifestyle as recommended. How to practice mindfulness To do a basic awareness exercise:  Find a comfortable place to sit.  Pay attention to the present moment. Observe your thoughts, feelings, and surroundings just as they are.  Avoid placing judgment on yourself, your feelings, or your  surroundings. Make note of any judgment that comes up, and let it go.  Your mind may wander, and that is okay. Make note of when your thoughts drift, and return your attention to the present moment.  To do basic mindfulness meditation:  Find a comfortable place to sit. This may include a stable chair or a firm floor cushion. ? Sit upright with your back straight. Let your arms fall next to your side with your hands resting on your legs. ? If sitting in a chair, rest your feet flat on the floor. ? If sitting on a cushion, cross your legs in front of you.  Keep your head in a neutral position with your chin dropped slightly. Relax your jaw and rest the tip of your tongue on the roof of your mouth. Drop your gaze to the floor. You can close your eyes if you like.  Breathe normally and pay attention to your breath. Feel the air moving in and out of your nose. Feel your belly expanding and relaxing with each breath.  Your mind may wander, and that is okay. Make note of when your thoughts drift, and return your attention to your breath.  Avoid placing judgment on yourself, your feelings, or your surroundings. Make note of any judgment or feelings that come up, let them go, and bring your attention back to your breath.  When you are ready, lift your gaze or open your eyes. Pay attention to how your body feels after the meditation.  Where to find more information: You can find more information about MBSR from:  Your health care provider.  Community-based meditation centers or programs.  Programs offered near you.  Summary  Mindfulness-based stress reduction (MBSR) is a program that teaches you how to intentionally pay attention to the present moment. It is used with other treatments to help you cope better with daily stress, emotions, and pain.  MBSR focuses on developing self-awareness, which allows you to respond to life stress without judgment or negative emotions.  MBSR programs may  involve learning different mindfulness practices, such as breathing exercises, meditation, yoga, body scan, or mindful eating. Find a mindfulness practice that works best for you, and set aside time for it on a regular basis. This information is not intended to replace advice given to you by your health care provider. Make sure you discuss any questions you have with your health care provider. Document Released: 05/31/2016 Document Revised: 05/31/2016 Document Reviewed: 05/31/2016 Elsevier Interactive Patient Education  [employer]2018 Elsevier Inc.

## 2017-07-16 NOTE — Assessment & Plan Note (Signed)
Discussed trial of SSRI for severe anxiety and depression and he is agreeable- will start zoloft- dosing and side effects discussed Encouraged to continue routine F/U with psychology  Discussed nonpharm techniques to manage anxiety and depression and return/ER precautions and printed additional information in AVS RTC In 1 month for follow up - sertraline (ZOLOFT) 50 MG tablet; Take 1 tablet (50 mg total) by mouth daily.  Dispense: 30 tablet; Refill: 1

## 2017-07-16 NOTE — Progress Notes (Signed)
Name: Jason Ortega   MRN: [id number]    DOB: 1966-02-02   Date:07/16/2017       Progress Note  Subjective  Chief Complaint  Chief Complaint  Patient presents with  . Depression    HPI  Jason Ortega is here today to discuss anxiety and depression. He says he did not realize how anxious or depressed he was until his counselor told him. He has been following with a marriage counselor for the past year, and she recommended that he come see his PCP for possible treatment of anxiety/depression. He feels his symptoms have been going on for some time now. He says that he feels down and worried most days, PHQ and GAD scores are high. He denies thoughts of hurting himself or others He has not been on medications for anxiety or depression in the past  Depression screen [facility] 2/9 07/16/2017 12/07/2016  Decreased Interest 0 0  Down, Depressed, Hopeless 2 0  PHQ - 2 Score 2 0  Altered sleeping 3 -  Tired, decreased energy 3 -  Change in appetite 2 -  Feeling bad or failure about yourself  3 -  Trouble concentrating 2 -  Moving slowly or fidgety/restless 3 -  Suicidal thoughts 0 -  PHQ-9 Score 18 -   GAD 7 : Generalized Anxiety Score 07/16/2017  Nervous, Anxious, on Edge 1  Control/stop worrying 3  Worry too much - different things 3  Trouble relaxing 2  Restless 2  Easily annoyed or irritable 3  Afraid - awful might happen 2  Total GAD 7 Score 16          Patient Active Problem List   Diagnosis Date Noted  . Tingling of both feet 04/08/2017  . Lightheadedness 03/26/2017  . Chronic migraine without aura, with intractable migraine, so stated, with status migrainosus 03/04/2017  . Chronic bilateral low back pain with bilateral sciatica 12/07/2016  . Type 2 diabetes mellitus without complication, without long-term current use of insulin ([place]) 06/06/2016  . Essential hypertension 06/06/2016  . Gastroesophageal reflux disease without esophagitis 06/06/2016    Past Surgical History:   Procedure Laterality Date  . CARDIAC CATHETERIZATION  12/2011  . LEFT HEART CATHETERIZATION WITH CORONARY ANGIOGRAM N/A 11/12/2011   Procedure: LEFT HEART CATHETERIZATION WITH CORONARY ANGIOGRAM;  Surgeon: Laverda Page, MD;  Location: [facility] CATH LAB;  Service: Cardiovascular;  Laterality: N/A;  . SHOULDER SURGERY      Family History  Problem Relation Age of Onset  . Arthritis Mother   . Stroke Mother   . Hypertension Father   . Diabetes Father   . Cancer Father   . Diabetes Maternal Grandfather     Social History   Socioeconomic History  . Marital status: Married    Spouse name: Not on file  . Number of children: 2  . Years of education: 61  . Highest education level: Not on file  Occupational History  . Occupation: Clinical research associate  Social Needs  . Financial resource strain: Not on file  . Food insecurity:    Worry: Not on file    Inability: Not on file  . Transportation needs:    Medical: Not on file    Non-medical: Not on file  Tobacco Use  . Smoking status: Never Smoker  . Smokeless tobacco: Never Used  Substance and Sexual Activity  . Alcohol use: Yes    Comment: occasional  . Drug use: No  . Sexual activity: Yes    Birth control/protection: None  Lifestyle  . Physical activity:    Days per week: Not on file    Minutes per session: Not on file  . Stress: Not on file  Relationships  . Social connections:    Talks on phone: Not on file    Gets together: Not on file    Attends religious service: Not on file    Active member of club or organization: Not on file    Attends meetings of clubs or organizations: Not on file    Relationship status: Not on file  . Intimate partner violence:    Fear of current or ex partner: Not on file    Emotionally abused: Not on file    Physically abused: Not on file    Forced sexual activity: Not on file  Other Topics Concern  . Not on file  Social History Narrative   Fun/Hobby: Coach football     Current Outpatient  Medications:  .  aspirin 81 MG tablet, Take 1 tablet (81 mg total) by mouth daily., Disp: 90 tablet, Rfl: 1 .  atorvastatin (LIPITOR) 40 MG tablet, TAKE 1 TABLET(40 MG) BY MOUTH DAILY, Disp: 90 tablet, Rfl: 1 .  Blood Glucose Monitoring Suppl (ONETOUCH VERIO) w/Device KIT, 1 Device by Does not apply route 2 (two) times daily as needed., Disp: 1 kit, Rfl: 0 .  dapagliflozin propanediol (FARXIGA) 5 MG TABS tablet, Take 5 mg by mouth daily., Disp: 90 tablet, Rfl: 0 .  Exenatide ER (BYDUREON BCISE) 2 MG/0.85ML AUIJ, Inject 2 mg into the skin once a week., Disp: 12 pen, Rfl: 1 .  gabapentin (NEURONTIN) 600 MG tablet, Take 600 mg by mouth 2 (two) times daily., Disp: , Rfl:  .  HYDROcodone-acetaminophen (NORCO) 10-325 MG tablet, Take 1 tablet by mouth every 4 (four) hours as needed., Disp: 45 tablet, Rfl: 0 .  ibuprofen (ADVIL,MOTRIN) 600 MG tablet, Take 1 tablet (600 mg total) by mouth every 8 (eight) hours as needed., Disp: 30 tablet, Rfl: 0 .  insulin degludec (TRESIBA FLEXTOUCH) 100 UNIT/ML SOPN FlexTouch Pen, Inject 0.2 mLs (20 Units total) into the skin daily. And pen needles 1/day, Disp: 5 pen, Rfl: 11 .  Insulin Pen Needle 32G X 4 MM MISC, Used to inject insulin one time daily., Disp: 100 each, Rfl: 11 .  losartan-hydrochlorothiazide (HYZAAR) 100-25 MG tablet, Take 1 tablet by mouth daily., Disp: 90 tablet, Rfl: 1 .  metFORMIN (GLUCOPHAGE) 1000 MG tablet, Take 1 tablet (1,000 mg total) by mouth 2 (two) times daily with a meal., Disp: 180 tablet, Rfl: 1 .  methocarbamol (ROBAXIN) 500 MG tablet, Take 1 tablet (500 mg total) by mouth 2 (two) times daily., Disp: 20 tablet, Rfl: 0 .  omeprazole (PRILOSEC) 40 MG capsule, Take 1 capsule (40 mg total) by mouth daily as needed (indigestion)., Disp: 90 capsule, Rfl: 1 .  SUMAtriptan (IMITREX) 100 MG tablet, Take 1 tablet (100 mg total) by mouth once as needed for up to 1 dose. May repeat in 2 hours if headache persists or recurs., Disp: 10 tablet, Rfl: 12 .   timolol (TIMOPTIC-XR) 0.5 % ophthalmic gel-forming, Place 1 drop into both eyes daily. , Disp: , Rfl:  .  topiramate (TOPAMAX) 100 MG tablet, Take 1 tablet (100 mg total) by mouth daily., Disp: 30 tablet, Rfl: 11 .  verapamil (CALAN-SR) 180 MG CR tablet, Take 1 tablet (180 mg total) by mouth daily., Disp: 90 tablet, Rfl: 0  No Known Allergies   ROS See HPI  Objective  Vitals:   07/16/17 1116  BP: (!) 146/78  Pulse: 78  Resp: 16  Temp: 98.7 F (37.1 C)  TempSrc: Oral  SpO2: 96%  Weight: 227 lb (103 kg)  Height: 5' 8" (1.727 m)    Body mass index is 34.52 kg/m.  Physical Exam Vital signs reviewed. Constitutional: Patient appears well-developed and well-nourished. No distress.  HENT: Head: Normocephalic and atraumatic. Eyes: Conjunctivae and EOM are normal. Pupils are equal, round, and reactive to light. No scleral icterus.  Neck: Normal range of motion. Neck supple.  Cardiovascular: Normal rate, regular rhythm and normal heart sounds. No murmur heard.No BLE edema. Intact distal pulses. Pulmonary/Chest: Effort normal and breath sounds normal. Neurological: he is alert and oriented to person, place, and time. No cranial nerve deficit. Coordination, balance, strength, speech and gait are normal.  Skin: Skin is warm and dry.  Psychiatric: Patient expressed anxiety and depression. behavior is normal. Judgment and thought content normal.   Assessment & Plan RTC in about 1 month for F/U: CPE is already scheduled, we will check his BP and F/U On anxiety and depression at CPE- starting zoloft today

## 2017-07-19 ENCOUNTER — Other Ambulatory Visit: Payer: Self-pay

## 2017-07-19 ENCOUNTER — Telehealth: Payer: Self-pay | Admitting: Nurse Practitioner

## 2017-07-19 MED ORDER — ATORVASTATIN CALCIUM 40 MG PO TABS: ORAL_TABLET | ORAL | 1 refills | Status: DC

## 2017-07-19 NOTE — Telephone Encounter (Signed)
Rx sent 

## 2017-07-19 NOTE — Telephone Encounter (Signed)
Copied from CRM [id number]#[id number]. Topic: Quick Communication - Rx Refill/Question >> Jul 19, 2017  3:15 PM Zada GirtLander, Lumin L wrote: Medication: atorvastatin (LIPITOR) 40 MG tablet  Has the patient contacted their pharmacy? Yes.   (Agent: If no, request that the patient contact the pharmacy for the refill.) (Agent: If yes, when and what did the pharmacy advise?)  Preferred Pharmacy (with phone number or street name): [phone] [place] [address] [facility] [address] [place] [id number] Phone: [phone] Fax: [phone]  Agent: Please be advised that RX refills may take up to 3 business days. We ask that you follow-up with your pharmacy.

## 2017-07-24 ENCOUNTER — Telehealth: Payer: Self-pay | Admitting: Neurology

## 2017-07-24 ENCOUNTER — Telehealth: Payer: Self-pay | Admitting: *Deleted

## 2017-07-24 NOTE — Telephone Encounter (Signed)
PA for Aimovig 140mg /ml SQ Q30 days completed via phone with [facility], phone# [phone]. Dx: Chronic Migraine (G43.711). Tried and failed meds: Topamax, Verapamil, Robaxin, Valsartan, Zofran, Tylenol, ASA, Ibuprofen, Imitrex, Hydrocodone, Diclofenac, Ajovy. PA approved for 3 mos. (07/24/17-10/24/17).  PA# [id number]/[id number]

## 2017-07-24 NOTE — Telephone Encounter (Signed)
Pt called stating the pharmacy([facility]) is needing a PA for Aimovig.

## 2017-07-24 NOTE — Telephone Encounter (Signed)
Pt called back, msg relayed. He was appreciative

## 2017-07-24 NOTE — Telephone Encounter (Signed)
Noted/fim 

## 2017-07-24 NOTE — Telephone Encounter (Signed)
LMOM for pt. that Aimovig has been approved thru 10/24/17, so he should be able to pick it up from his pharmacy now.  He does not need to return this call unless he has other questions/fim

## 2017-07-29 NOTE — Telephone Encounter (Signed)
Received call from Phil, pharmacist @ [facility] seeking clarification on Aimovig. RN advised this is to replace Ajovy. It is Aimovig 140 mg subcutaneous injection every 30 days. It has been approved by [facility] from 07/24/17-10/24/17. He verbalized understanding and appreciation.

## 2017-08-06 ENCOUNTER — Encounter: Payer: Self-pay | Admitting: Nurse Practitioner

## 2017-08-06 ENCOUNTER — Encounter

## 2017-08-06 ENCOUNTER — Ambulatory Visit (INDEPENDENT_AMBULATORY_CARE_PROVIDER_SITE_OTHER): Payer: 59 | Admitting: Nurse Practitioner

## 2017-08-06 VITALS — BP 144/72 | HR 88 | Temp 97.8°F | Resp 16 | Ht 68.0 in | Wt 227.0 lb

## 2017-08-06 DIAGNOSIS — Z13220 Encounter for screening for lipoid disorders: Secondary | ICD-10-CM

## 2017-08-06 DIAGNOSIS — Z1211 Encounter for screening for malignant neoplasm of colon: Secondary | ICD-10-CM

## 2017-08-06 DIAGNOSIS — Z0001 Encounter for general adult medical examination with abnormal findings: Secondary | ICD-10-CM

## 2017-08-06 DIAGNOSIS — F419 Anxiety disorder, unspecified: Secondary | ICD-10-CM | POA: Diagnosis not present

## 2017-08-06 DIAGNOSIS — K219 Gastro-esophageal reflux disease without esophagitis: Secondary | ICD-10-CM | POA: Diagnosis not present

## 2017-08-06 DIAGNOSIS — I10 Essential (primary) hypertension: Secondary | ICD-10-CM | POA: Diagnosis not present

## 2017-08-06 DIAGNOSIS — Z125 Encounter for screening for malignant neoplasm of prostate: Secondary | ICD-10-CM

## 2017-08-06 DIAGNOSIS — F329 Major depressive disorder, single episode, unspecified: Secondary | ICD-10-CM | POA: Diagnosis not present

## 2017-08-06 MED ORDER — VERAPAMIL HCL ER 180 MG PO TBCR: 180.0000 mg | EXTENDED_RELEASE_TABLET | Freq: Every day | ORAL | 2 refills | Status: DC

## 2017-08-06 NOTE — Patient Instructions (Addendum)
Please return to the lab downstairs for labwork when fasting- only water or black coffee for 6 hours prior  Please try to check your blood pressure once daily or at least a few times a week, at the same time each day, and keep a log. Please return in about 3 weeks with your blood pressure log so we can determine next step for your blood pressure Your blood pressure goal is <140/90.    Health Maintenance, Male A healthy lifestyle and preventive care is important for your health and wellness. Ask your health care provider about what schedule of regular examinations is right for you. What should I know about weight and diet? Eat a Healthy Diet  Eat plenty of vegetables, fruits, whole grains, low-fat dairy products, and lean protein.  Do not eat a lot of foods high in solid fats, added sugars, or salt.  Maintain a Healthy Weight Regular exercise can help you achieve or maintain a healthy weight. You should:  Do at least 150 minutes of exercise each week. The exercise should increase your heart rate and make you sweat (moderate-intensity exercise).  Do strength-training exercises at least twice a week.  Watch Your Levels of Cholesterol and Blood Lipids  Have your blood tested for lipids and cholesterol every 5 years starting at 54 years of age. If you are at high risk for heart disease, you should start having your blood tested when you are 54 years old. You may need to have your cholesterol levels checked more often if: ? Your lipid or cholesterol levels are high. ? You are older than 54 years of age. ? You are at high risk for heart disease.  What should I know about cancer screening? Many types of cancers can be detected early and may often be prevented. Lung Cancer  You should be screened every year for lung cancer if: ? You are a current smoker who has smoked for at least 30 years. ? You are a former smoker who has quit within the past 15 years.  Talk to your health care  provider about your screening options, when you should start screening, and how often you should be screened.  Colorectal Cancer  Routine colorectal cancer screening usually begins at 54 years of age and should be repeated every 5-10 years until you are 54 years old. You may need to be screened more often if early forms of precancerous polyps or small growths are found. Your health care provider may recommend screening at an earlier age if you have risk factors for colon cancer.  Your health care provider may recommend using home test kits to check for hidden blood in the stool.  A small camera at the end of a tube can be used to examine your colon (sigmoidoscopy or colonoscopy). This checks for the earliest forms of colorectal cancer.  Prostate and Testicular Cancer  Depending on your age and overall health, your health care provider may do certain tests to screen for prostate and testicular cancer.  Talk to your health care provider about any symptoms or concerns you have about testicular or prostate cancer.  Skin Cancer  Check your skin from head to toe regularly.  Tell your health care provider about any new moles or changes in moles, especially if: ? There is a change in a mole's size, shape, or color. ? You have a mole that is larger than a pencil eraser.  Always use sunscreen. Apply sunscreen liberally and repeat throughout the day.  Protect  yourself by wearing long sleeves, pants, a wide-brimmed hat, and sunglasses when outside.  What should I know about heart disease, diabetes, and high blood pressure?  If you are 73-1 years of age, have your blood pressure checked every 3-5 years. If you are 46 years of age or older, have your blood pressure checked every year. You should have your blood pressure measured twice-once when you are at a hospital or clinic, and once when you are not at a hospital or clinic. Record the average of the two measurements. To check your blood pressure  when you are not at a hospital or clinic, you can use: ? An automated blood pressure machine at a pharmacy. ? A home blood pressure monitor.  Talk to your health care provider about your target blood pressure.  If you are between 54-19 years old, ask your health care provider if you should take aspirin to prevent heart disease.  Have regular diabetes screenings by checking your fasting blood sugar level. ? If you are at a normal weight and have a low risk for diabetes, have this test once every three years after the age of 38. ? If you are overweight and have a high risk for diabetes, consider being tested at a younger age or more often.  A one-time screening for abdominal aortic aneurysm (AAA) by ultrasound is recommended for men aged 65-75 years who are current or former smokers. What should I know about preventing infection? Hepatitis B If you have a higher risk for hepatitis B, you should be screened for this virus. Talk with your health care provider to find out if you are at risk for hepatitis B infection. Hepatitis C Blood testing is recommended for:  Everyone born from [birth year] through 1967.  Anyone with known risk factors for hepatitis C.  Sexually Transmitted Diseases (STDs)  You should be screened each year for STDs including gonorrhea and chlamydia if: ? You are sexually active and are younger than 54 years of age. ? You are older than 54 years of age and your health care provider tells you that you are at risk for this type of infection. ? Your sexual activity has changed since you were last screened and you are at an increased risk for chlamydia or gonorrhea. Ask your health care provider if you are at risk.  Talk with your health care provider about whether you are at high risk of being infected with HIV. Your health care provider may recommend a prescription medicine to help prevent HIV infection.  What else can I do?  Schedule regular health, dental, and eye  exams.  Stay current with your vaccines (immunizations).  Do not use any tobacco products, such as cigarettes, chewing tobacco, and e-cigarettes. If you need help quitting, ask your health care provider.  Limit alcohol intake to no more than 2 drinks per day. One drink equals 12 ounces of beer, 5 ounces of wine, or 1 ounces of hard liquor.  Do not use street drugs.  Do not share needles.  Ask your health care provider for help if you need support or information about quitting drugs.  Tell your health care provider if you often feel depressed.  Tell your health care provider if you have ever been abused or do not feel safe at home. This information is not intended to replace advice given to you by your health care provider. Make sure you discuss any questions you have with your health care provider. Document Released: 07/21/2007 Document Revised:  09/21/2015 Document Reviewed: 10/26/2014 Elsevier Interactive Patient Education  [employer].

## 2017-08-06 NOTE — Assessment & Plan Note (Signed)
Again today his BP reading is slightly elevated, but he is hesitant to adjust BP medications as he is already on so many medications He would like to log BP readings at home and RTC in 3 weeks with log We will consider medication adjustments if BP remains elevated at that time  - verapamil (CALAN-SR) 180 MG CR tablet; Take 1 tablet (180 mg total) by mouth daily.  Dispense: 30 tablet; Refill: 2 - CBC; Future - Comprehensive metabolic panel; Future

## 2017-08-06 NOTE — Assessment & Plan Note (Signed)
Maintained on prilosec daily with adequate control of symptoms, although if he does miss a dose he will experience gastric upset, reflux Continue current dosage of prilosec Update labs F/U for new, worsening symptoms - Magnesium; Future

## 2017-08-06 NOTE — Assessment & Plan Note (Addendum)
-  Prostate cancer screening and PSA options (with potential risks and benefits of testing vs not testing) were discussed along with recent recs/guidelines. -USPSTF grade A and B recommendations reviewed with patient; age-appropriate recommendations, preventive care, screening tests, etc discussed and encouraged; healthy living and sunscreen use encouraged; see AVS for patient education given to patient. Advanced directives packet provided. -Discussed importance of 150 minutes of physical activity weekly,eat 6 servings of fruit/vegetables daily and drink plenty of water and avoid sweet beverages.  -Follow up and care instructions discussed and provided in AVS.   -Reviewed Health Maintenance:  - PSA; Future-Screening for prostate cancer - Ambulatory referral to Gastroenterology-Screening for colon cancer  Screening for cholesterol level Continue atorvastatin Pork chop biscuit prior to appointment today, he will return for labs when fasting F/U with further recommendations pending lab results - Lipid panel; Future

## 2017-08-06 NOTE — Assessment & Plan Note (Signed)
Stable Continue zoloft atcurrent dosage continue F/U with counseling F/U for new, worsening symptoms

## 2017-08-06 NOTE — Progress Notes (Signed)
Name: Jason Ortega   MRN: [id number]    DOB: May 19, 1965   Date:08/06/2017       Progress Note  Subjective  Chief Complaint  Chief Complaint  Patient presents with  . CPE    not fasting    HPI  Patient presents for annual CPE.  USPSTF grade A and B recommendations:  Diet, Exercise: trying to be active and watch his diet to lose weight  Depression: started on zoloft 50 daily at last OV on 07/16/17 for uncontrolled anxiety and depression, currently going through separation and marriage counseling which is causing a lot of his stress He has been taking the zoloft daily as prescribed without noted adverse effects, and feels that it does help some of his symptoms, he feels "more mellow", calmer, less anxious. Denies thoughts of hurting himself or others. Depression screen [facility] 2/9 07/16/2017 12/07/2016  Decreased Interest 0 0  Down, Depressed, Hopeless 2 0  PHQ - 2 Score 2 0  Altered sleeping 3 -  Tired, decreased energy 3 -  Change in appetite 2 -  Feeling bad or failure about yourself  3 -  Trouble concentrating 2 -  Moving slowly or fidgety/restless 3 -  Suicidal thoughts 0 -  PHQ-9 Score 18 -   Hypertension -maintained on losartan-HCTZ 100-25 daily, verapamil 180 mg daily Reports daily medication compliance without noted adverse medication effects. Reports he does not routinely check his BP readings at home, but occasionally checks and gets normal readings Feels his blood pressure increases when he comes in to office for medical appointments due to anxiety about his appointment  BP Readings from Last 3 Encounters:  08/06/17 (!) 144/72  07/16/17 (!) 146/78  07/04/17 122/70   Obesity: Wt Readings from Last 3 Encounters:  08/06/17 227 lb (103 kg)  07/16/17 227 lb (103 kg)  07/04/17 222 lb (100.7 kg)   BMI Readings from Last 3 Encounters:  08/06/17 34.52 kg/m  07/16/17 34.52 kg/m  07/04/17 33.75 kg/m   Cholesterol- maintained on atorvastatin 40 mg daily Reports daily  medication compliance without noted adverse medication effects.  No results found for: CHOL, HDL, LDLCALC, LDLDIRECT, TRIG, CHOLHDL  Glucose: following with endocrinology for diabetes Glucose, Bld  Date Value Ref Range Status  04/05/2017 98 70 - 99 mg/dL Final  12/07/2016 104 (H) 70 - 99 mg/dL Final  06/06/2016 90 70 - 99 mg/dL Final   Glucose-Capillary  Date Value Ref Range Status  11/12/2011 140 (H) 70 - 99 mg/dL Final  11/12/2011 138 (H) 70 - 99 mg/dL Final   Alcohol: no  Tobacco use: no, never  STD testing and prevention (chl/gon/syphilis): declines concerns, screening HIV, hep C: declines screening  Skin cancer: does not routinely wear sunscreen  Colorectal cancer: No personal or family history of colon ca, no abdominal pain, no bowel changes, no rectal bleeding. His last colonoscopy was outside our system, he thinks about 10 years ago, will update referral today  Prostate cancer: check PSA today No results found for: PSA IPSS Questionnaire (AUA-7): Over the past month.   1)  How often have you had a sensation of not emptying your bladder completely after you finish urinating?  0 - Not at all  2)  How often have you had to urinate again less than two hours after you finished urinating? 1 - Less than 1 time in 5  3)  How often have you found you stopped and started again several times when you urinated?  0 - Not at  all  4) How difficult have you found it to postpone urination?  0 - Not at all  5) How often have you had a weak urinary stream?  0 - Not at all  6) How often have you had to push or strain to begin urination?  0 - Not at all  7) How many times did you most typically get up to urinate from the time you went to bed until the time you got up in the morning?  0 - None  Total score:  1   Aspirin: taking 81 mg daily ECG:  Not indicated  Vaccinations: PNA done per patient, TDAP is up to date per patient  Advanced Care Planning: A voluntary discussion about advance  care planning including the explanation and discussion of advance directives.  Discussed health care proxy and Living will, and the patient DOES NOT have a living will at present time. If patient does have living will, I have requested they bring this to the clinic to be scanned in to their chart.  Patient Active Problem List   Diagnosis Date Noted  . Anxiety and depression 07/16/2017  . Tingling of both feet 04/08/2017  . Lightheadedness 03/26/2017  . Chronic migraine without aura, with intractable migraine, so stated, with status migrainosus 03/04/2017  . Chronic bilateral low back pain with bilateral sciatica 12/07/2016  . Type 2 diabetes mellitus without complication, without long-term current use of insulin ([facility]) 06/06/2016  . Essential hypertension 06/06/2016  . Gastroesophageal reflux disease without esophagitis 06/06/2016    Past Surgical History:  Procedure Laterality Date  . CARDIAC CATHETERIZATION  12/2011  . LEFT HEART CATHETERIZATION WITH CORONARY ANGIOGRAM N/A 11/12/2011   Procedure: LEFT HEART CATHETERIZATION WITH CORONARY ANGIOGRAM;  Surgeon: Laverda Page, MD;  [facility] LAB;  Service: Cardiovascular;  Laterality: N/A;  . SHOULDER SURGERY      Family History  Problem Relation Age of Onset  . Arthritis Mother   . Stroke Mother   . Hypertension Father   . Diabetes Father   . Cancer Father   . Diabetes Maternal Grandfather     Social History   Socioeconomic History  . Marital status: Married    Spouse name: Not on file  . Number of children: 2  . Years of education: 45  . Highest education level: Not on file  Occupational History  . Occupation: Clinical research associate  Social Needs  . Financial resource strain: Not on file  . Food insecurity:    Worry: Not on file    Inability: Not on file  . Transportation needs:    Medical: Not on file    Non-medical: Not on file  Tobacco Use  . Smoking status: Never Smoker  . Smokeless tobacco: Never Used  Substance  and Sexual Activity  . Alcohol use: Yes    Comment: occasional  . Drug use: No  . Sexual activity: Yes    Birth control/protection: None  Lifestyle  . Physical activity:    Days per week: Not on file    Minutes per session: Not on file  . Stress: Not on file  Relationships  . Social connections:    Talks on phone: Not on file    Gets together: Not on file    Attends religious service: Not on file    Active member of club or organization: Not on file    Attends meetings of clubs or organizations: Not on file    Relationship status: Not on file  .  Intimate partner violence:    Fear of current or ex partner: Not on file    Emotionally abused: Not on file    Physically abused: Not on file    Forced sexual activity: Not on file  Other Topics Concern  . Not on file  Social History Narrative   Fun/Hobby: Coach football     Current Outpatient Medications:  .  aspirin 81 MG tablet, Take 1 tablet (81 mg total) by mouth daily., Disp: 90 tablet, Rfl: 1 .  atorvastatin (LIPITOR) 40 MG tablet, TAKE 1 TABLET(40 MG) BY MOUTH DAILY, Disp: 90 tablet, Rfl: 1 .  Blood Glucose Monitoring Suppl (ONETOUCH VERIO) w/Device KIT, 1 Device by Does not apply route 2 (two) times daily as needed., Disp: 1 kit, Rfl: 0 .  dapagliflozin propanediol (FARXIGA) 5 MG TABS tablet, Take 5 mg by mouth daily., Disp: 90 tablet, Rfl: 0 .  Erenumab-aooe (AIMOVIG) 140 MG/ML SOAJ, Inject 140 mg into the skin every 30 (thirty) days., Disp: , Rfl:  .  Exenatide ER (BYDUREON BCISE) 2 MG/0.85ML AUIJ, Inject 2 mg into the skin once a week., Disp: 12 pen, Rfl: 1 .  gabapentin (NEURONTIN) 600 MG tablet, Take 600 mg by mouth 2 (two) times daily., Disp: , Rfl:  .  ibuprofen (ADVIL,MOTRIN) 600 MG tablet, Take 1 tablet (600 mg total) by mouth every 8 (eight) hours as needed., Disp: 30 tablet, Rfl: 0 .  insulin degludec (TRESIBA FLEXTOUCH) 100 UNIT/ML SOPN FlexTouch Pen, Inject 0.2 mLs (20 Units total) into the skin daily. And pen  needles 1/day, Disp: 5 pen, Rfl: 11 .  Insulin Pen Needle 32G X 4 MM MISC, Used to inject insulin one time daily., Disp: 100 each, Rfl: 11 .  losartan-hydrochlorothiazide (HYZAAR) 100-25 MG tablet, Take 1 tablet by mouth daily., Disp: 90 tablet, Rfl: 1 .  metFORMIN (GLUCOPHAGE) 1000 MG tablet, Take 1 tablet (1,000 mg total) by mouth 2 (two) times daily with a meal., Disp: 180 tablet, Rfl: 1 .  methocarbamol (ROBAXIN) 500 MG tablet, Take 1 tablet (500 mg total) by mouth 2 (two) times daily., Disp: 20 tablet, Rfl: 0 .  omeprazole (PRILOSEC) 40 MG capsule, Take 1 capsule (40 mg total) by mouth daily as needed (indigestion)., Disp: 90 capsule, Rfl: 1 .  sertraline (ZOLOFT) 50 MG tablet, Take 1 tablet (50 mg total) by mouth daily., Disp: 30 tablet, Rfl: 1 .  SUMAtriptan (IMITREX) 100 MG tablet, Take 1 tablet (100 mg total) by mouth once as needed for up to 1 dose. May repeat in 2 hours if headache persists or recurs., Disp: 10 tablet, Rfl: 12 .  timolol (TIMOPTIC-XR) 0.5 % ophthalmic gel-forming, Place 1 drop into both eyes daily. , Disp: , Rfl:  .  topiramate (TOPAMAX) 100 MG tablet, Take 1 tablet (100 mg total) by mouth daily., Disp: 30 tablet, Rfl: 11 .  verapamil (CALAN-SR) 180 MG CR tablet, Take 1 tablet (180 mg total) by mouth daily., Disp: 30 tablet, Rfl: 2  No Known Allergies   ROS  Constitutional: Negative for fever or weight change.  Respiratory: Negative for cough and shortness of breath.   Cardiovascular: Negative for chest pain or palpitations.  Gastrointestinal: Negative for abdominal pain, no bowel changes.  Musculoskeletal: Negative for gait problem or joint swelling.  Skin: Negative for rash.  Neurological: Negative for dizziness or headache.  No other specific complaints in a complete review of systems (except as listed in HPI above).   Objective  Vitals:   08/06/17 6568  BP: (!) 144/72  Pulse: 88  Resp: 16  Temp: 97.8 F (36.6 C)  TempSrc: Oral  SpO2: 98%  Weight:  227 lb (103 kg)  Height: '5\' 8"'$  (1.727 m)    Body mass index is 34.52 kg/m.  Physical Exam Vital signs reviewed. Constitutional: Patient appears well-developed and well-nourished. No distress.  HENT: Head: Normocephalic and atraumatic. Ears: B TMs ok, no erythema or effusion; Nose: Nose normal. Mouth/Throat: Oropharynx is clear and moist. No oropharyngeal exudate.  Eyes: Conjunctivae and EOM are normal. Pupils are equal, round, and reactive to light. No scleral icterus.  Neck: Normal range of motion. Neck supple. No cervical adenopathy. No thyromegaly present.  Cardiovascular: Normal rate, regular rhythm and normal heart sounds.  No murmur heard. No BLE edema. Distal pulses intact Pulmonary/Chest: Effort normal and breath sounds normal. No respiratory distress. Abdominal: Soft. Bowel sounds are normal, no distension. There is no tenderness. no masses Musculoskeletal: Normal range of motion, No gross deformities Neurological: he is alert and oriented to person, place, and time. No cranial nerve deficit. Coordination, balance, strength, speech and gait are normal.  Skin: Skin is warm and dry. No rash noted. No erythema.  Psychiatric: Patient has a normal mood and affect. behavior is normal. Judgment and thought content normal.  Assessment & Plan RTC in about 3 weeks for F/U: HTN- recheck BP, review home log, consider medication adjustments TSH WNL 04/05/17

## 2017-08-07 ENCOUNTER — Other Ambulatory Visit: Payer: Self-pay | Admitting: Nurse Practitioner

## 2017-08-07 DIAGNOSIS — F329 Major depressive disorder, single episode, unspecified: Secondary | ICD-10-CM

## 2017-08-07 DIAGNOSIS — F419 Anxiety disorder, unspecified: Principal | ICD-10-CM

## 2017-08-27 ENCOUNTER — Other Ambulatory Visit: Payer: Self-pay

## 2017-08-27 MED ORDER — LOSARTAN POTASSIUM-HCTZ 100-25 MG PO TABS: 1.0000 | ORAL_TABLET | Freq: Every day | ORAL | 1 refills | Status: DC

## 2017-09-03 ENCOUNTER — Other Ambulatory Visit (INDEPENDENT_AMBULATORY_CARE_PROVIDER_SITE_OTHER): Payer: 59

## 2017-09-03 DIAGNOSIS — I10 Essential (primary) hypertension: Secondary | ICD-10-CM

## 2017-09-03 DIAGNOSIS — Z13220 Encounter for screening for lipoid disorders: Secondary | ICD-10-CM

## 2017-09-03 DIAGNOSIS — Z125 Encounter for screening for malignant neoplasm of prostate: Secondary | ICD-10-CM

## 2017-09-03 DIAGNOSIS — Z0001 Encounter for general adult medical examination with abnormal findings: Secondary | ICD-10-CM | POA: Diagnosis not present

## 2017-09-03 DIAGNOSIS — K219 Gastro-esophageal reflux disease without esophagitis: Secondary | ICD-10-CM

## 2017-09-03 LAB — COMPREHENSIVE METABOLIC PANEL
ALBUMIN: 4.2 g/dL (ref 3.5–5.2)
ALK PHOS: 49 U/L (ref 39–117)
ALT: 20 U/L (ref 0–53)
AST: 25 U/L (ref 0–37)
BILIRUBIN TOTAL: 0.4 mg/dL (ref 0.2–1.2)
BUN: 14 mg/dL (ref 6–23)
CALCIUM: 9.2 mg/dL (ref 8.4–10.5)
CO2: 29 mEq/L (ref 19–32)
Chloride: 102 mEq/L (ref 96–112)
Creatinine, Ser: 1.17 mg/dL (ref 0.40–1.50)
GFR: 83.58 mL/min (ref >60.00)
Glucose, Bld: 135 mg/dL — ABNORMAL HIGH (ref 70–99)
Potassium: 3.6 mEq/L (ref 3.5–5.1)
SODIUM: 139 meq/L (ref 135–145)
TOTAL PROTEIN: 8 g/dL (ref 6.0–8.3)

## 2017-09-03 LAB — LIPID PANEL
CHOL/HDL RATIO: 3
Cholesterol: 137 mg/dL (ref 0–200)
HDL: 52.3 mg/dL (ref >39.00)
LDL Cholesterol: 74 mg/dL (ref 0–99)
NONHDL: 85.09
TRIGLYCERIDES: 53 mg/dL (ref 0.0–149.0)
VLDL: 10.6 mg/dL (ref 0.0–40.0)

## 2017-09-03 LAB — CBC
HCT: 32.4 % — ABNORMAL LOW (ref 39.0–52.0)
Hemoglobin: 9.2 g/dL — ABNORMAL LOW (ref 13.0–17.0)
MCHC: 28.4 g/dL — ABNORMAL LOW (ref 30.0–36.0)
MCV: 67.4 fl — ABNORMAL LOW (ref 78.0–100.0)
Platelets: 370 10*3/uL (ref 150.0–400.0)
RBC: 4.77 Mil/uL (ref 4.22–5.81)
RDW: 21.3 % — AB (ref 11.5–15.5)
WBC: 4.3 10*3/uL (ref 4.0–10.5)

## 2017-09-03 LAB — PSA: PSA: 0.6 ng/mL (ref 0.10–4.00)

## 2017-09-03 LAB — MAGNESIUM: Magnesium: 2.2 mg/dL (ref 1.5–2.5)

## 2017-09-06 ENCOUNTER — Ambulatory Visit: Payer: 59 | Admitting: Nurse Practitioner

## 2017-09-09 ENCOUNTER — Other Ambulatory Visit: Payer: Self-pay | Admitting: Family

## 2017-09-09 DIAGNOSIS — D649 Anemia, unspecified: Secondary | ICD-10-CM

## 2017-09-10 ENCOUNTER — Encounter: Payer: Self-pay | Admitting: Gastroenterology

## 2017-09-10 ENCOUNTER — Other Ambulatory Visit (INDEPENDENT_AMBULATORY_CARE_PROVIDER_SITE_OTHER): Payer: 59

## 2017-09-10 ENCOUNTER — Other Ambulatory Visit: Payer: Self-pay | Admitting: Family

## 2017-09-10 DIAGNOSIS — D649 Anemia, unspecified: Secondary | ICD-10-CM | POA: Diagnosis not present

## 2017-09-10 LAB — CBC WITH DIFFERENTIAL/PLATELET
Basophils Absolute: 0 10*3/uL (ref 0.0–0.1)
Basophils Relative: 1.1 % (ref 0.0–3.0)
EOS PCT: 2.4 % (ref 0.0–5.0)
Eosinophils Absolute: 0.1 10*3/uL (ref 0.0–0.7)
HEMATOCRIT: 29.6 % — AB (ref 39.0–52.0)
Hemoglobin: 8.4 g/dL — ABNORMAL LOW (ref 13.0–17.0)
LYMPHS ABS: 1.2 10*3/uL (ref 0.7–4.0)
LYMPHS PCT: 33.8 % (ref 12.0–46.0)
MONOS PCT: 15.8 % — AB (ref 3.0–12.0)
Monocytes Absolute: 0.6 10*3/uL (ref 0.1–1.0)
NEUTROS PCT: 46.9 % (ref 43.0–77.0)
Neutro Abs: 1.7 10*3/uL (ref 1.4–7.7)
Platelets: 320 10*3/uL (ref 150.0–400.0)
RBC: 4.37 Mil/uL (ref 4.22–5.81)
RDW: 20.7 % — ABNORMAL HIGH (ref 11.5–15.5)
WBC: 3.7 10*3/uL — ABNORMAL LOW (ref 4.0–10.5)

## 2017-09-10 LAB — IRON,TIBC AND FERRITIN PANEL
%SAT: 5 % — AB (ref 20–48)
FERRITIN: 7 ng/mL — AB (ref 38–380)
Iron: 18 ug/dL — ABNORMAL LOW (ref 50–180)
TIBC: 398 mcg/dL (calc) (ref 250–425)

## 2017-09-11 ENCOUNTER — Other Ambulatory Visit: Payer: Self-pay | Admitting: Family

## 2017-09-11 ENCOUNTER — Telehealth: Payer: Self-pay | Admitting: Family

## 2017-09-11 DIAGNOSIS — D509 Iron deficiency anemia, unspecified: Secondary | ICD-10-CM

## 2017-09-11 NOTE — Telephone Encounter (Signed)
Pt aware of response below. Pt stated that he will try to get in tomorrow for blood work. He just started a new job and his next day off will not be until Tuesday.

## 2017-09-11 NOTE — Telephone Encounter (Signed)
We are working to get his anemia evaluated quickly. I have put in a STAT referral to hematology. In the interim, please stop his aspirin/ no more NSAIDs for now; take his Prilosec bid and start OTC Slow FE OTC 325 bid;  I want him to come get his CBC checked tomorrow.

## 2017-09-12 ENCOUNTER — Encounter: Payer: Self-pay | Admitting: Hematology and Oncology

## 2017-09-12 ENCOUNTER — Telehealth: Payer: Self-pay | Admitting: Hematology and Oncology

## 2017-09-12 ENCOUNTER — Other Ambulatory Visit (INDEPENDENT_AMBULATORY_CARE_PROVIDER_SITE_OTHER): Payer: 59

## 2017-09-12 DIAGNOSIS — D509 Iron deficiency anemia, unspecified: Secondary | ICD-10-CM | POA: Diagnosis not present

## 2017-09-12 LAB — CBC WITH DIFFERENTIAL/PLATELET
BASOS ABS: 0.1 10*3/uL (ref 0.0–0.1)
Basophils Relative: 1.7 % (ref 0.0–3.0)
Eosinophils Absolute: 0.1 10*3/uL (ref 0.0–0.7)
Eosinophils Relative: 2.3 % (ref 0.0–5.0)
HCT: 29.6 % — ABNORMAL LOW (ref 39.0–52.0)
LYMPHS PCT: 32.5 % (ref 12.0–46.0)
Lymphs Abs: 1.4 10*3/uL (ref 0.7–4.0)
MCHC: 28.8 g/dL — ABNORMAL LOW (ref 30.0–36.0)
MONOS PCT: 12 % (ref 3.0–12.0)
Monocytes Absolute: 0.5 10*3/uL (ref 0.1–1.0)
Neutro Abs: 2.3 10*3/uL (ref 1.4–7.7)
Neutrophils Relative %: 51.5 % (ref 43.0–77.0)
Platelets: 343 10*3/uL (ref 150.0–400.0)
RBC: 4.42 Mil/uL (ref 4.22–5.81)
RDW: 20.6 % — ABNORMAL HIGH (ref 11.5–15.5)
WBC: 4.5 10*3/uL (ref 4.0–10.5)

## 2017-09-12 NOTE — Telephone Encounter (Signed)
New hematology referral received from Ria ClockLaura Murray, FNP at [facility]  for anemia. Pt has been scheduled to see Dr. Pamelia HoitGudena on 8/27 at 1pm. I was unable to schedule the pt for an earlier appt date and time due to his work schedule. Pt only has Tuesdays off. I notified Lawson FiscalLori at [facility] of this and she will let the provider know. Letter mailed to the pt.

## 2017-09-17 NOTE — Telephone Encounter (Deleted)
Copied from CRM [id number]. Topic: Quick Communication - Rx Refill/Question >> Sep 17, 2017  2:29 PM Angela NevinWilliams, Candice N wrote: Medication: omeprazole (PRILOSEC) 40 MG capsule [[id number]][[id number]]    Pt states that he has contacted the pharmacy about this medication and pharm states that there are 0 refills. Pt would like to know why and if he can get another refill sent in. Please advise.   [facility] Pharmacy: [facility] [place] - [id number] AT Portal to Registered [facility] Sites [phone] (Phone) [phone] (Fax)    Agent: Please be advised that RX refills may take up to 3 business days. We ask that you follow-up with your pharmacy.

## 2017-09-18 ENCOUNTER — Other Ambulatory Visit: Payer: Self-pay

## 2017-09-19 ENCOUNTER — Other Ambulatory Visit: Payer: Self-pay | Admitting: *Deleted

## 2017-09-19 MED ORDER — OMEPRAZOLE 40 MG PO CPDR: 40.0000 mg | DELAYED_RELEASE_CAPSULE | Freq: Every day | ORAL | 1 refills | Status: DC | PRN

## 2017-09-20 ENCOUNTER — Telehealth: Payer: Self-pay | Admitting: Nurse Practitioner

## 2017-09-20 ENCOUNTER — Other Ambulatory Visit: Payer: Self-pay

## 2017-09-20 MED ORDER — OMEPRAZOLE 40 MG PO CPDR: 40.0000 mg | DELAYED_RELEASE_CAPSULE | Freq: Every day | ORAL | 1 refills | Status: DC | PRN

## 2017-09-20 NOTE — Telephone Encounter (Signed)
Copied from CRM [id number]. Topic: Quick Communication - Rx Refill/Question >> Sep 17, 2017  2:29 PM Angela NevinWilliams, Candice N wrote: Medication: omeprazole (PRILOSEC) 40 MG capsule [[id number]][[id number]]    Pt states that he has contacted the pharmacy about this medication and pharm states that there are 0 refills. Pt would like to know why and if he can get another refill sent in. Please advise.   [facility] [place] [place] - [id number] AT Portal to Registered [facility] Sites [phone] (Phone) [phone] (Fax)    Agent: Please be advised that RX refills may take up to 3 business days. We ask that you follow-up with your pharmacy. >> Sep 20, 2017  4:30 PM Elliot GaultBell, Tiffany M wrote: [facility] [place] - [id number] AT Portal to Registered [facility] Sites [phone] (Phone) [phone] (Fax)   Phone [phone]   Patient requesting 3 month supply losartan-hydrochlorothiazide (HYZAAR) 100-25 MG tablet, omeprazole (PRILOSEC) 40 MG capsule and metFORMIN (GLUCOPHAGE) 1000 MG tablet, stating the mail order is not receiving Rx and would like Rx called in.

## 2017-09-23 ENCOUNTER — Other Ambulatory Visit: Payer: Self-pay

## 2017-09-23 ENCOUNTER — Other Ambulatory Visit (INDEPENDENT_AMBULATORY_CARE_PROVIDER_SITE_OTHER): Payer: 59

## 2017-09-23 DIAGNOSIS — R7989 Other specified abnormal findings of blood chemistry: Secondary | ICD-10-CM

## 2017-09-23 LAB — CBC WITH DIFFERENTIAL/PLATELET
BASOS ABS: 0 10*3/uL (ref 0.0–0.1)
Basophils Relative: 0.4 % (ref 0.0–3.0)
EOS PCT: 2.9 % (ref 0.0–5.0)
Eosinophils Absolute: 0.1 10*3/uL (ref 0.0–0.7)
HCT: 32.1 % — ABNORMAL LOW (ref 39.0–52.0)
HEMOGLOBIN: 9.1 g/dL — AB (ref 13.0–17.0)
LYMPHS ABS: 1.4 10*3/uL (ref 0.7–4.0)
Lymphocytes Relative: 27.6 % (ref 12.0–46.0)
MCV: 70.9 fl — AB (ref 78.0–100.0)
MONOS PCT: 9.2 % (ref 3.0–12.0)
Monocytes Absolute: 0.5 10*3/uL (ref 0.1–1.0)
NEUTROS PCT: 59.9 % (ref 43.0–77.0)
Neutro Abs: 3 10*3/uL (ref 1.4–7.7)
Platelets: 441 10*3/uL — ABNORMAL HIGH (ref 150.0–400.0)
RBC: 4.53 Mil/uL (ref 4.22–5.81)
RDW: 24 % — ABNORMAL HIGH (ref 11.5–15.5)
WBC: 5.1 10*3/uL (ref 4.0–10.5)

## 2017-09-23 NOTE — Telephone Encounter (Signed)
Rx sent 

## 2017-10-01 ENCOUNTER — Telehealth: Payer: Self-pay | Admitting: Hematology and Oncology

## 2017-10-01 ENCOUNTER — Inpatient Hospital Stay: Payer: 59 | Attending: Hematology and Oncology | Admitting: Hematology and Oncology

## 2017-10-01 DIAGNOSIS — Z809 Family history of malignant neoplasm, unspecified: Secondary | ICD-10-CM | POA: Diagnosis not present

## 2017-10-01 DIAGNOSIS — D509 Iron deficiency anemia, unspecified: Secondary | ICD-10-CM | POA: Insufficient documentation

## 2017-10-01 NOTE — Telephone Encounter (Signed)
Gave avs and calendar ° °

## 2017-10-01 NOTE — Progress Notes (Signed)
[facility] CONSULT NOTE  Patient Care Team: Lance Sell, NP as PCP - General (Nurse Practitioner)  CHIEF COMPLAINTS/PURPOSE OF CONSULTATION:  Severe iron deficiency anemia  HISTORY OF PRESENTING ILLNESS:  Jason Ortega 52 y.o. male is here because of recent diagnosis of severe iron deficiency anemia.  Patient went for routine physical exam and was found to have severe anemia with a hemoglobin of 9.2.  It was repeated and underwent down to 8.4.[name]  MCV of 67 and iron studies done by his primary care physician revealed ferritin of 7 and iron saturation of 5%.  Because of this he was referred to [place] as well as gastroenterology for further evaluation.  He denies any blood in the stool.  Since he took iron his stools have turned darker.  He has not been able to tolerate oral iron therapy because of severe gastritis and constipation.  MEDICAL HISTORY:  Past Medical History:  Diagnosis Date  . Allergy   . Diabetes mellitus   . Frequent headaches   . Glaucoma   . Hypercholesteremia   . Hypertension   . Migraines   . Sleep apnea     SURGICAL HISTORY: Past Surgical History:  Procedure Laterality Date  . CARDIAC CATHETERIZATION  12/2011  . LEFT HEART CATHETERIZATION WITH CORONARY ANGIOGRAM N/A 11/12/2011   Procedure: LEFT HEART CATHETERIZATION WITH CORONARY ANGIOGRAM;  Surgeon: Laverda Page, MD;  Location: [facility] CATH LAB;  Service: Cardiovascular;  Laterality: N/A;  . SHOULDER SURGERY      SOCIAL HISTORY: Social History   Socioeconomic History  . Marital status: Married    Spouse name: Not on file  . Number of children: 2  . Years of education: 59  . Highest education level: Not on file  Occupational History  . Occupation: Clinical research associate  Social Needs  . Financial resource strain: Not on file  . Food insecurity:    Worry: Not on file    Inability: Not on file  . Transportation needs:    Medical: Not on file    Non-medical: Not on file  Tobacco Use  . Smoking  status: Never Smoker  . Smokeless tobacco: Never Used  Substance and Sexual Activity  . Alcohol use: Yes    Comment: occasional  . Drug use: No  . Sexual activity: Yes    Birth control/protection: None  Lifestyle  . Physical activity:    Days per week: Not on file    Minutes per session: Not on file  . Stress: Not on file  Relationships  . Social connections:    Talks on phone: Not on file    Gets together: Not on file    Attends religious service: Not on file    Active member of club or organization: Not on file    Attends meetings of clubs or organizations: Not on file    Relationship status: Not on file  . Intimate partner violence:    Fear of current or ex partner: Not on file    Emotionally abused: Not on file    Physically abused: Not on file    Forced sexual activity: Not on file  Other Topics Concern  . Not on file  Social History Narrative   Fun/Hobby: Coach football    FAMILY HISTORY: Family History  Problem Relation Age of Onset  . Arthritis Mother   . Stroke Mother   . Hypertension Father   . Diabetes Father   . Cancer Father   . Diabetes Maternal Grandfather  ALLERGIES:  has No Known Allergies.  MEDICATIONS:  Current Outpatient Medications  Medication Sig Dispense Refill  . aspirin 81 MG tablet Take 1 tablet (81 mg total) by mouth daily. 90 tablet 1  . atorvastatin (LIPITOR) 40 MG tablet TAKE 1 TABLET(40 MG) BY MOUTH DAILY 90 tablet 1  . Blood Glucose Monitoring Suppl (ONETOUCH VERIO) w/Device KIT 1 Device by Does not apply route 2 (two) times daily as needed. 1 kit 0  . dapagliflozin propanediol (FARXIGA) 5 MG TABS tablet Take 5 mg by mouth daily. 90 tablet 0  . Erenumab-aooe (AIMOVIG) 140 MG/ML SOAJ Inject 140 mg into the skin every 30 (thirty) days.    . Exenatide ER (BYDUREON BCISE) 2 MG/0.85ML AUIJ Inject 2 mg into the skin once a week. 12 pen 1  . gabapentin (NEURONTIN) 600 MG tablet Take 600 mg by mouth 2 (two) times daily.    [name] ibuprofen  (ADVIL,MOTRIN) 600 MG tablet Take 1 tablet (600 mg total) by mouth every 8 (eight) hours as needed. 30 tablet 0  . insulin degludec (TRESIBA FLEXTOUCH) 100 UNIT/ML SOPN FlexTouch Pen Inject 0.2 mLs (20 Units total) into the skin daily. And pen needles 1/day 5 pen 11  . Insulin Pen Needle 32G X 4 MM MISC Used to inject insulin one time daily. 100 each 11  . losartan-hydrochlorothiazide (HYZAAR) 100-25 MG tablet Take 1 tablet by mouth daily. 90 tablet 1  . metFORMIN (GLUCOPHAGE) 1000 MG tablet Take 1 tablet (1,000 mg total) by mouth 2 (two) times daily with a meal. 180 tablet 1  . omeprazole (PRILOSEC) 40 MG capsule Take 1 capsule (40 mg total) by mouth daily as needed (indigestion). 90 capsule 1  . sertraline (ZOLOFT) 50 MG tablet TAKE 1 TABLET BY MOUTH EVERY DAY 90 tablet 1  . SUMAtriptan (IMITREX) 100 MG tablet Take 1 tablet (100 mg total) by mouth once as needed for up to 1 dose. May repeat in 2 hours if headache persists or recurs. 10 tablet 12  . timolol (TIMOPTIC-XR) 0.5 % ophthalmic gel-forming Place 1 drop into both eyes daily.     [name] topiramate (TOPAMAX) 100 MG tablet Take 1 tablet (100 mg total) by mouth daily. 30 tablet 11  . verapamil (CALAN-SR) 180 MG CR tablet Take 1 tablet (180 mg total) by mouth daily. 30 tablet 2   No current facility-administered medications for this visit.     REVIEW OF SYSTEMS:   Constitutional: Denies fevers, chills or abnormal night sweats Eyes: Denies blurriness of vision, double vision or watery eyes Ears, nose, mouth, throat, and face: Denies mucositis or sore throat Respiratory: Denies cough, dyspnea or wheezes Cardiovascular: Denies palpitation, chest discomfort or lower extremity swelling Gastrointestinal:  Denies nausea, heartburn or change in bowel habits Skin: Denies abnormal skin rashes Lymphatics: Denies new lymphadenopathy or easy bruising Neurological:Denies numbness, tingling or new weaknesses Behavioral/Psych: Mood is stable, no new changes     All other systems were reviewed with the patient and are negative.  PHYSICAL EXAMINATION: ECOG PERFORMANCE STATUS: 1 - Symptomatic but completely ambulatory  Vitals:   10/01/17 1331  BP: (!) 147/78  Pulse: 73  Resp: 18  Temp: 98.1 F (36.7 C)  SpO2: 100%   Filed Weights   10/01/17 1331  Weight: 220 lb (99.8 kg)    GENERAL:alert, no distress and comfortable SKIN: skin color, texture, turgor are normal, no rashes or significant lesions EYES: normal, conjunctiva are pink and non-injected, sclera clear OROPHARYNX:no exudate, no erythema and lips, buccal mucosa,  and tongue normal  NECK: supple, thyroid normal size, non-tender, without nodularity LYMPH:  no palpable lymphadenopathy in the cervical, axillary or inguinal LUNGS: clear to auscultation and percussion with normal breathing effort HEART: regular rate & rhythm and no murmurs and no lower extremity edema ABDOMEN:abdomen soft, non-tender and normal bowel sounds Musculoskeletal:no cyanosis of digits and no clubbing  PSYCH: alert & oriented x 3 with fluent speech NEURO: no focal motor/sensory deficits   LABORATORY DATA:  I have reviewed the data as listed Lab Results  Component Value Date   WBC 5.1 09/23/2017   HGB 9.1 (L) 09/23/2017   HCT 32.1 (L) 09/23/2017   MCV 70.9 (L) 09/23/2017   PLT 441.0 (H) 09/23/2017   Lab Results  Component Value Date   NA 139 09/03/2017   K 3.6 09/03/2017   CL 102 09/03/2017   CO2 29 09/03/2017    RADIOGRAPHIC STUDIES: I have personally reviewed the radiological reports and agreed with the findings in the report.  ASSESSMENT AND PLAN:  Iron deficiency anemia New onset of severe iron deficiency anemia detected on a routine physical exam Lab review 09/23/2017: Hemoglobin is 9.1, MCV 70.9, platelets 441 Ferritin 7, iron saturation 5%  Iron deficiency anemia: I discussed with the patient the process of iron absorption. I counseled extensively regarding the different causes of  iron deficiency including blood loss and malabsorption. Patient has been scheduled to see gastroenterology to discuss going through upper endoscopies and colonoscopies to find the source of blood loss. It is possible that the patient may still have an occult source of bleeding. However malabsorption is also a possibility. Patient is intolerable to oral iron therapy. However, the patient appears to be responding to the oral iron therapy   Recommendation: 1. Stop oral iron 2. proceed with 2 doses of IV iron infusion with Feraheme  I will see the patient in 6 weeks to recheck his blood work. By then he would have had a GI evaluation.      All questions were answered. The patient knows to call the clinic with any problems, questions or concerns.    Harriette Ohara, MD 10/01/17

## 2017-10-01 NOTE — Assessment & Plan Note (Signed)
New onset of severe iron deficiency anemia detected on a routine physical exam Lab review 09/23/2017: Hemoglobin is 9.1, MCV 70.9, platelets 441 Ferritin 7, iron saturation 5%  Iron deficiency anemia: I discussed with the patient the process of iron absorption. I counseled extensively regarding the different causes of iron deficiency including blood loss and malabsorption. Patient has been scheduled to see gastroenterology to discuss going through upper endoscopies and colonoscopies to find the source of blood loss. It is possible that the patient may still have an occult source of bleeding. However malabsorption is also a possibility. Patient is intolerable to oral iron therapy. However, the patient appears to be responding to the oral iron therapy   Recommendation: 1. Stop oral iron 2. proceed with 2 doses of IV iron infusion with Feraheme  I will see the patient in 6 weeks to recheck his blood work. By then he would have had a GI evaluation.

## 2017-10-08 ENCOUNTER — Encounter: Payer: Self-pay | Admitting: Gastroenterology

## 2017-10-08 ENCOUNTER — Inpatient Hospital Stay: Payer: 59 | Attending: Hematology and Oncology

## 2017-10-08 ENCOUNTER — Other Ambulatory Visit (INDEPENDENT_AMBULATORY_CARE_PROVIDER_SITE_OTHER): Payer: 59

## 2017-10-08 ENCOUNTER — Other Ambulatory Visit: Payer: Self-pay | Admitting: Hematology and Oncology

## 2017-10-08 ENCOUNTER — Ambulatory Visit (INDEPENDENT_AMBULATORY_CARE_PROVIDER_SITE_OTHER): Payer: 59 | Admitting: Gastroenterology

## 2017-10-08 ENCOUNTER — Encounter: Payer: 59 | Admitting: Nurse Practitioner

## 2017-10-08 VITALS — BP 141/81 | HR 64 | Temp 98.0°F | Resp 18

## 2017-10-08 VITALS — BP 152/88 | HR 72 | Ht 67.0 in | Wt 220.0 lb

## 2017-10-08 DIAGNOSIS — Z86010 Personal history of colonic polyps: Secondary | ICD-10-CM

## 2017-10-08 DIAGNOSIS — D509 Iron deficiency anemia, unspecified: Secondary | ICD-10-CM

## 2017-10-08 LAB — IGA: IgA: 209 mg/dL (ref 68–378)

## 2017-10-08 MED ORDER — SODIUM CHLORIDE 0.9 % IV SOLN
510.0000 mg | Freq: Once | INTRAVENOUS | Status: AC
  Administered 2017-10-08: 510 mg via INTRAVENOUS
  Filled 2017-10-08: qty 17

## 2017-10-08 MED ORDER — PEG-KCL-NACL-NASULF-NA ASC-C 140 G PO SOLR: 1.0000 | Freq: Once | ORAL | 0 refills | Status: AC

## 2017-10-08 MED ORDER — SODIUM CHLORIDE 0.9 % IV SOLN
Freq: Once | INTRAVENOUS | Status: AC
  Administered 2017-10-08: 09:00:00 via INTRAVENOUS
  Filled 2017-10-08: qty 250

## 2017-10-08 NOTE — Patient Instructions (Signed)

## 2017-10-08 NOTE — Patient Instructions (Signed)
Your provider has requested that you go to the basement level for lab work before leaving today. Press "B" on the elevator. The lab is located at the first door on the left as you exit the elevator.  You have been scheduled for a colonoscopy. Please follow written instructions given to you at your visit today.  Please pick up your prep supplies at the pharmacy within the next 1-3 days. If you use inhalers (even only as needed), please bring them with you on the day of your procedure. Your physician has requested that you go to www.startemmi.com and enter the access code given to you at your visit today. This web site gives a general overview about your procedure. However, you should still follow specific instructions given to you by our office regarding your preparation for the procedure.  Normal BMI (Body Mass Index- based on height and weight) is between 19 and 25. Your BMI today is Body mass index is 34.46 kg/m. [name] Please consider follow up  regarding your BMI with your Primary Care Provider.  Thank you for choosing me and [facility].  Venita Lick. Pleas Koch., MD., Clementeen Graham

## 2017-10-08 NOTE — Progress Notes (Signed)
History of Present Illness: This is a 52 year old male referred by Marrian Salvage,* FNP for the evaluation of iron deficiency anemia.  Recent hemoglobin=9.1, MCV=70.9, ferritin=7, iron saturation=5%.  He was unable to tolerate oral iron and began iron infusions today.  Prior colonoscopy by Dr. Collene Mares in 2014. Pt recalls polyps removed.  He recalls a recommendation for 5-year colonoscopy.  No prior EGD.  No gastrointestinal complaints. Denies weight loss, abdominal pain, constipation, diarrhea, change in stool caliber, melena, hematochezia, nausea, vomiting, dysphagia, reflux symptoms, chest pain.    No Known Allergies Outpatient Medications Prior to Visit  Medication Sig Dispense Refill  . atorvastatin (LIPITOR) 40 MG tablet TAKE 1 TABLET(40 MG) BY MOUTH DAILY 90 tablet 1  . Blood Glucose Monitoring Suppl (ONETOUCH VERIO) w/Device KIT 1 Device by Does not apply route 2 (two) times daily as needed. 1 kit 0  . dapagliflozin propanediol (FARXIGA) 5 MG TABS tablet Take 5 mg by mouth daily. 90 tablet 0  . Erenumab-aooe (AIMOVIG) 140 MG/ML SOAJ Inject 140 mg into the skin every 30 (thirty) days.    [name] gabapentin (NEURONTIN) 600 MG tablet Take 600 mg by mouth 2 (two) times daily.    [name] ibuprofen (ADVIL,MOTRIN) 600 MG tablet Take 1 tablet (600 mg total) by mouth every 8 (eight) hours as needed. 30 tablet 0  . insulin degludec (TRESIBA FLEXTOUCH) 100 UNIT/ML SOPN FlexTouch Pen Inject 0.2 mLs (20 Units total) into the skin daily. And pen needles 1/day 5 pen 11  . Insulin Pen Needle 32G X 4 MM MISC Used to inject insulin one time daily. 100 each 11  . losartan-hydrochlorothiazide (HYZAAR) 100-25 MG tablet Take 1 tablet by mouth daily. 90 tablet 1  . metFORMIN (GLUCOPHAGE) 1000 MG tablet Take 1 tablet (1,000 mg total) by mouth 2 (two) times daily with a meal. 180 tablet 1  . omeprazole (PRILOSEC) 40 MG capsule Take 1 capsule (40 mg total) by mouth daily as needed (indigestion). 90 capsule 1  .  sertraline (ZOLOFT) 50 MG tablet TAKE 1 TABLET BY MOUTH EVERY DAY 90 tablet 1  . SUMAtriptan (IMITREX) 100 MG tablet Take 1 tablet (100 mg total) by mouth once as needed for up to 1 dose. May repeat in 2 hours if headache persists or recurs. 10 tablet 12  . timolol (TIMOPTIC-XR) 0.5 % ophthalmic gel-forming Place 1 drop into both eyes daily.     [name] topiramate (TOPAMAX) 100 MG tablet Take 1 tablet (100 mg total) by mouth daily. 30 tablet 11  . verapamil (CALAN-SR) 180 MG CR tablet Take 1 tablet (180 mg total) by mouth daily. 30 tablet 2  . aspirin 81 MG tablet Take 1 tablet (81 mg total) by mouth daily. 90 tablet 1  . Exenatide ER (BYDUREON BCISE) 2 MG/0.85ML AUIJ Inject 2 mg into the skin once a week. 12 pen 1   No facility-administered medications prior to visit.    Past Medical History:  Diagnosis Date  . Allergy   . Diabetes mellitus   . Frequent headaches   . Glaucoma   . Hypercholesteremia   . Hypertension   . Migraines   . Sleep apnea    Past Surgical History:  Procedure Laterality Date  . CARDIAC CATHETERIZATION  12/2011  . LEFT HEART CATHETERIZATION WITH CORONARY ANGIOGRAM N/A 11/12/2011   Procedure: LEFT HEART CATHETERIZATION WITH CORONARY ANGIOGRAM;  Surgeon: Laverda Page, MD;  Location: [facility] CATH LAB;  Service: Cardiovascular;  Laterality: N/A;  . SHOULDER SURGERY  Social History   Socioeconomic History  . Marital status: Married    Spouse name: Not on file  . Number of children: 2  . Years of education: 73  . Highest education level: Not on file  Occupational History  . Occupation: Clinical research associate  Social Needs  . Financial resource strain: Not on file  . Food insecurity:    Worry: Not on file    Inability: Not on file  . Transportation needs:    Medical: Not on file    Non-medical: Not on file  Tobacco Use  . Smoking status: Never Smoker  . Smokeless tobacco: Never Used  Substance and Sexual Activity  . Alcohol use: Yes    Comment: occasional  . Drug use:  No  . Sexual activity: Yes    Birth control/protection: None  Lifestyle  . Physical activity:    Days per week: Not on file    Minutes per session: Not on file  . Stress: Not on file  Relationships  . Social connections:    Talks on phone: Not on file    Gets together: Not on file    Attends religious service: Not on file    Active member of club or organization: Not on file    Attends meetings of clubs or organizations: Not on file    Relationship status: Not on file  Other Topics Concern  . Not on file  Social History Narrative   Fun/Hobby: Coach football   Family History  Problem Relation Age of Onset  . Arthritis Mother   . Stroke Mother   . Hypertension Father   . Diabetes Father   . Cancer Father        Multiple Myleoma  . Diabetes Maternal Grandfather       Review of Systems: Pertinent positive and negative review of systems were noted in the above HPI section. All other review of systems were otherwise negative.   Physical Exam: General: Well developed, well nourished, no acute distress Head: Normocephalic and atraumatic Eyes:  sclerae anicteric, EOMI Ears: Normal auditory acuity Mouth: No deformity or lesions Neck: Supple, no masses or thyromegaly Lungs: Clear throughout to auscultation Heart: Regular rate and rhythm; no murmurs, rubs or bruits Abdomen: Soft, non tender and non distended. No masses, hepatosplenomegaly or hernias noted. Normal Bowel sounds Rectal: Deferred to colonoscopy Musculoskeletal: Symmetrical with no gross deformities  Skin: No lesions on visible extremities Pulses:  Normal pulses noted Extremities: No clubbing, cyanosis, edema or deformities noted Neurological: Alert oriented x 4, grossly nonfocal Cervical Nodes:  No significant cervical adenopathy Inguinal Nodes: No significant inguinal adenopathy Psychological:  Alert and cooperative. Normal mood and affect  Assessment and Recommendations:  1.  Iron deficiency anemia.   Personal history of colon polyps.  Rule out occult gastrointestinal losses, celiac disease. IgA, tTG today.  Request records from prior colonoscopy and pathology from Dr. Lorie Apley office.  Schedule colonoscopy.  The risks (including bleeding, perforation, infection, missed lesions, medication reactions and possible hospitalization or surgery if complications occur), benefits, and alternatives to colonoscopy with possible biopsy and possible polypectomy were discussed with the patient and they consent to proceed.     cc: Marrian Salvage, [address], [place] [address]

## 2017-10-09 LAB — TISSUE TRANSGLUTAMINASE, IGA: (tTG) Ab, IgA: 1 U/mL

## 2017-10-11 ENCOUNTER — Other Ambulatory Visit: Payer: Self-pay

## 2017-10-11 ENCOUNTER — Inpatient Hospital Stay: Payer: 59

## 2017-10-11 DIAGNOSIS — D509 Iron deficiency anemia, unspecified: Secondary | ICD-10-CM | POA: Diagnosis not present

## 2017-10-11 LAB — OCCULT BLOOD X 1 CARD TO LAB, STOOL
FECAL OCCULT BLD: NEGATIVE
FECAL OCCULT BLD: NEGATIVE
FECAL OCCULT BLD: NEGATIVE

## 2017-10-15 ENCOUNTER — Inpatient Hospital Stay: Payer: 59

## 2017-10-15 VITALS — BP 135/86 | HR 73 | Temp 98.6°F | Resp 17

## 2017-10-15 DIAGNOSIS — D509 Iron deficiency anemia, unspecified: Secondary | ICD-10-CM | POA: Diagnosis not present

## 2017-10-15 MED ORDER — SODIUM CHLORIDE 0.9 % IV SOLN
Freq: Once | INTRAVENOUS | Status: AC
  Administered 2017-10-15: 09:00:00 via INTRAVENOUS
  Filled 2017-10-15: qty 250

## 2017-10-15 MED ORDER — SODIUM CHLORIDE 0.9 % IV SOLN
510.0000 mg | Freq: Once | INTRAVENOUS | Status: AC
  Administered 2017-10-15: 510 mg via INTRAVENOUS
  Filled 2017-10-15: qty 17

## 2017-10-15 NOTE — Patient Instructions (Signed)

## 2017-10-17 ENCOUNTER — Encounter: Payer: Self-pay | Admitting: Gastroenterology

## 2017-10-17 ENCOUNTER — Ambulatory Visit (AMBULATORY_SURGERY_CENTER): Payer: 59 | Admitting: Gastroenterology

## 2017-10-17 VITALS — BP 99/60 | HR 68 | Temp 98.0°F | Resp 15 | Ht 67.0 in | Wt 220.0 lb

## 2017-10-17 DIAGNOSIS — K5730 Diverticulosis of large intestine without perforation or abscess without bleeding: Secondary | ICD-10-CM

## 2017-10-17 DIAGNOSIS — D509 Iron deficiency anemia, unspecified: Secondary | ICD-10-CM

## 2017-10-17 DIAGNOSIS — K648 Other hemorrhoids: Secondary | ICD-10-CM

## 2017-10-17 MED ORDER — SODIUM CHLORIDE 0.9 % IV SOLN: 500.0000 mL | Freq: Once | INTRAVENOUS | Status: DC

## 2017-10-17 MED ORDER — FERROUS SULFATE 325 (65 FE) MG PO TABS: 325.0000 mg | ORAL_TABLET | Freq: Two times a day (BID) | ORAL | 3 refills | Status: DC

## 2017-10-17 NOTE — Patient Instructions (Signed)
*  Handouts given on diverticulosis and hemorrhoids.  YOU HAD AN ENDOSCOPIC PROCEDURE TODAY AT THE [facility]:   Refer to the procedure report that was given to you for any specific questions about what was found during the examination.  If the procedure report does not answer your questions, please call your gastroenterologist to clarify.  If you requested that your care partner not be given the details of your procedure findings, then the procedure report has been included in a sealed envelope for you to review at your convenience later.  YOU SHOULD EXPECT: Some feelings of bloating in the abdomen. Passage of more gas than usual.  Walking can help get rid of the air that was put into your GI tract during the procedure and reduce the bloating. If you had a lower endoscopy (such as a colonoscopy or flexible sigmoidoscopy) you may notice spotting of blood in your stool or on the toilet paper. If you underwent a bowel prep for your procedure, you may not have a normal bowel movement for a few days.  Please Note:  You might notice some irritation and congestion in your nose or some drainage.  This is from the oxygen used during your procedure.  There is no need for concern and it should clear up in a day or so.  SYMPTOMS TO REPORT IMMEDIATELY:   Following lower endoscopy (colonoscopy or flexible sigmoidoscopy):  Excessive amounts of blood in the stool  Significant tenderness or worsening of abdominal pains  Swelling of the abdomen that is new, acute  Fever of 100F or higher   For urgent or emergent issues, a gastroenterologist can be reached at any hour by calling [phone].   DIET:  We do recommend a small meal at first, but then you may proceed to your regular diet.  Drink plenty of fluids but you should avoid alcoholic beverages for 24 hours.  ACTIVITY:  You should plan to take it easy for the rest of today and you should NOT DRIVE or use heavy machinery until tomorrow  (because of the sedation medicines used during the test).    FOLLOW UP: Our staff will call the number listed on your records the next business day following your procedure to check on you and address any questions or concerns that you may have regarding the information given to you following your procedure. If we do not reach you, we will leave a message.  However, if you are feeling well and you are not experiencing any problems, there is no need to return our call.  We will assume that you have returned to your regular daily activities without incident.  If any biopsies were taken you will be contacted by phone or by letter within the next 1-3 weeks.  Please call us at [phone] if you have not heard about the biopsies in 3 weeks.    SIGNATURES/CONFIDENTIALITY: You and/or your care partner have signed paperwork which will be entered into your electronic medical record.  These signatures attest to the fact that that the information above on your After Visit Summary has been reviewed and is understood.  Full responsibility of the confidentiality of this discharge information lies with you and/or your care-partner. 

## 2017-10-17 NOTE — Progress Notes (Signed)
Report given to PACU, vss 

## 2017-10-17 NOTE — Op Note (Addendum)
[facility] Patient Name: Jason Ortega Procedure Date: 10/17/2017 10:34 AM MRN: [id number] Endoscopist: Jason Ortega , MD Age: 52 Referring MD:  Date of Birth: 02/19/1965 Gender: Male Account #: [id number] Procedure:                Colonoscopy Indications:              Iron deficiency anemia. Hemoccult negative stool. Medicines:                Monitored Anesthesia Care Procedure:                Pre-Anesthesia Assessment:                           - Prior to the procedure, a History and Physical                            was performed, and patient medications and                            allergies were reviewed. The patient's tolerance of                            previous anesthesia was also reviewed. The risks                            and benefits of the procedure and the sedation                            options and risks were discussed with the patient.                            All questions were answered, and informed consent                            was obtained. Prior Anticoagulants: The patient has                            taken no previous anticoagulant or antiplatelet                            agents. ASA Grade Assessment: II - A patient with                            mild systemic disease. After reviewing the risks                            and benefits, the patient was deemed in                            satisfactory condition to undergo the procedure.                           After obtaining informed consent, the colonoscope  was passed under direct vision. Throughout the                            procedure, the patient's blood pressure, pulse, and                            oxygen saturations were monitored continuously. The                            Colonoscope was introduced through the anus and                            advanced to the the cecum, identified by                            appendiceal orifice  and ileocecal valve. The                            terminal ileum, ileocecal valve, appendiceal                            orifice, and rectum were photographed. The quality                            of the bowel preparation was good. The colonoscopy                            was performed without difficulty. The patient                            tolerated the procedure well. Scope In: 10:42:09 AM Scope Out: 10:56:33 AM Scope Withdrawal Time: 0 hours 12 minutes 16 seconds  Total Procedure Duration: 0 hours 14 minutes 24 seconds  Findings:                 The perianal and digital rectal examinations were                            normal.                           The terminal ileum appeared normal.                           A few medium-mouthed diverticula were found in the                            left colon.                           Internal hemorrhoids were found during                            retroflexion. The hemorrhoids were small and Grade  I (internal hemorrhoids that do not prolapse).                           The exam was otherwise without abnormality on                            direct and retroflexion views. Complications:            No immediate complications. Estimated blood loss:                            None. Estimated Blood Loss:     Estimated blood loss: none. Impression:               - The examined portion of the ileum was normal.                           - Diverticulosis in the left colon.                           - Internal hemorrhoids.                           - The examination was otherwise normal on direct                            and retroflexion views.                           - No specimens collected. Recommendation:           - Repeat colonoscopy in 10 years for screening                            purposes.                           - Patient has a contact number available for                             emergencies. The signs and symptoms of potential                            delayed complications were discussed with the                            patient. Return to normal activities tomorrow.                            Written discharge instructions were provided to the                            patient.                           - Resume previous diet.                           -  Continue present medications.                           - Return to GI office in 2 months. Consider further                            evaluation with EGD and if negative then capsule                            endoscopy if his iron deficiency is refractory or                            new GI symptoms develop. Jason DareMalcolm T Mardie Kellen, MD 10/17/2017 11:02:38 AM This report has been signed electronically.

## 2017-10-18 ENCOUNTER — Telehealth: Payer: Self-pay | Admitting: *Deleted

## 2017-10-18 NOTE — Telephone Encounter (Signed)
  Follow up Call-  Call back number 10/17/2017  Post procedure Call Back phone  # #[phone] cell  Permission to leave phone message Yes  Some recent data might be hidden     Patient questions:  Do you have a fever, pain , or abdominal swelling? No. Pain Score  0 *  Have you tolerated food without any problems? Yes.    Have you been able to return to your normal activities? Yes.    Do you have any questions about your discharge instructions: Diet   No. Medications  No. Follow up visit  No.  Do you have questions or concerns about your Care? No.  Actions: * If pain score is 4 or above: No action needed, pain <4.

## 2017-10-28 ENCOUNTER — Other Ambulatory Visit: Payer: Self-pay | Admitting: Internal Medicine

## 2017-11-04 ENCOUNTER — Ambulatory Visit: Payer: 59 | Admitting: Endocrinology

## 2017-11-08 ENCOUNTER — Other Ambulatory Visit: Payer: Self-pay

## 2017-11-08 DIAGNOSIS — D500 Iron deficiency anemia secondary to blood loss (chronic): Secondary | ICD-10-CM

## 2017-11-11 ENCOUNTER — Inpatient Hospital Stay: Payer: 59 | Attending: Hematology and Oncology

## 2017-11-11 DIAGNOSIS — E119 Type 2 diabetes mellitus without complications: Secondary | ICD-10-CM | POA: Diagnosis not present

## 2017-11-11 DIAGNOSIS — D509 Iron deficiency anemia, unspecified: Secondary | ICD-10-CM | POA: Diagnosis present

## 2017-11-11 DIAGNOSIS — Z794 Long term (current) use of insulin: Secondary | ICD-10-CM | POA: Diagnosis not present

## 2017-11-11 DIAGNOSIS — D500 Iron deficiency anemia secondary to blood loss (chronic): Secondary | ICD-10-CM

## 2017-11-11 DIAGNOSIS — Z79899 Other long term (current) drug therapy: Secondary | ICD-10-CM | POA: Diagnosis not present

## 2017-11-11 LAB — CBC WITH DIFFERENTIAL (CANCER CENTER ONLY)
BASOS ABS: 0 10*3/uL (ref 0.0–0.1)
Basophils Relative: 1 %
Eosinophils Absolute: 0 10*3/uL (ref 0.0–0.5)
Eosinophils Relative: 1 %
HEMATOCRIT: 39.3 % (ref 38.4–49.9)
Hemoglobin: 11.8 g/dL — ABNORMAL LOW (ref 13.0–17.1)
LYMPHS ABS: 1.3 10*3/uL (ref 0.9–3.3)
LYMPHS PCT: 28 %
MCH: 23.1 pg — ABNORMAL LOW (ref 27.2–33.4)
MCHC: 30 g/dL — ABNORMAL LOW (ref 32.0–36.0)
MCV: 77.1 fL — AB (ref 79.3–98.0)
MONO ABS: 0.4 10*3/uL (ref 0.1–0.9)
MONOS PCT: 8 %
NEUTROS ABS: 2.8 10*3/uL (ref 1.5–6.5)
Neutrophils Relative %: 62 %
Platelet Count: 218 10*3/uL (ref 140–400)
RBC: 5.1 MIL/uL (ref 4.20–5.82)
RDW: 27.1 % — AB (ref 11.0–14.6)
WBC Count: 4.5 10*3/uL (ref 4.0–10.3)

## 2017-11-12 ENCOUNTER — Ambulatory Visit (INDEPENDENT_AMBULATORY_CARE_PROVIDER_SITE_OTHER): Payer: 59 | Admitting: Endocrinology

## 2017-11-12 ENCOUNTER — Encounter: Payer: Self-pay | Admitting: Endocrinology

## 2017-11-12 ENCOUNTER — Telehealth: Payer: Self-pay | Admitting: Hematology and Oncology

## 2017-11-12 ENCOUNTER — Inpatient Hospital Stay (HOSPITAL_BASED_OUTPATIENT_CLINIC_OR_DEPARTMENT_OTHER): Payer: 59 | Admitting: Hematology and Oncology

## 2017-11-12 VITALS — BP 152/90 | HR 73 | Ht 67.0 in | Wt 211.6 lb

## 2017-11-12 VITALS — BP 148/77 | HR 64 | Temp 98.6°F | Resp 18 | Ht 67.0 in | Wt 212.0 lb

## 2017-11-12 DIAGNOSIS — N529 Male erectile dysfunction, unspecified: Secondary | ICD-10-CM

## 2017-11-12 DIAGNOSIS — E119 Type 2 diabetes mellitus without complications: Secondary | ICD-10-CM

## 2017-11-12 DIAGNOSIS — Z794 Long term (current) use of insulin: Secondary | ICD-10-CM

## 2017-11-12 DIAGNOSIS — D509 Iron deficiency anemia, unspecified: Secondary | ICD-10-CM | POA: Diagnosis not present

## 2017-11-12 DIAGNOSIS — D500 Iron deficiency anemia secondary to blood loss (chronic): Secondary | ICD-10-CM

## 2017-11-12 LAB — POCT GLYCOSYLATED HEMOGLOBIN (HGB A1C): HEMOGLOBIN A1C: 5.5 % (ref 4.0–5.6)

## 2017-11-12 LAB — IRON AND TIBC
IRON: 67 ug/dL (ref 42–163)
Saturation Ratios: 22 % — ABNORMAL LOW (ref 42–163)
TIBC: 311 ug/dL (ref 202–409)
UIBC: 244 ug/dL

## 2017-11-12 LAB — FERRITIN: Ferritin: 150 ng/mL (ref 24–336)

## 2017-11-12 MED ORDER — INSULIN DEGLUDEC 100 UNIT/ML ~~LOC~~ SOPN: 15.0000 [IU] | PEN_INJECTOR | Freq: Every day | SUBCUTANEOUS | 11 refills | Status: DC

## 2017-11-12 NOTE — Telephone Encounter (Signed)
I informed the patient that the iron studies were normal. He will come back in 6 weeks for recheck labs.

## 2017-11-12 NOTE — Progress Notes (Signed)
Patient Care Team: Lance Sell, NP as PCP - General (Nurse Practitioner)  DIAGNOSIS:  Encounter Diagnoses  Name Primary?  . Iron deficiency anemia due to chronic blood loss Yes  . Iron deficiency anemia, unspecified iron deficiency anemia type      CHIEF COMPLIANT: Follow-up of iron deficiency anemia after receiving IV iron  INTERVAL HISTORY: Jason Ortega is a 52 year old with above-mentioned history of iron deficiency anemia who received IV iron therapy and is here for follow-up.  He had a remarkable improvement in his energy levels.  He had a colonoscopy which was negative for any bleeding.  He had some hemorrhoids.  He also had diverticulosis.  Denies any bleeding issues any further.  He tells me that he has erectile dysfunction and wonders if iron deficiency may be contributing to that.  He is diabetic but his hemoglobin A1c is below 6.  REVIEW OF SYSTEMS:   Constitutional: Denies fevers, chills or abnormal weight loss Eyes: Denies blurriness of vision Ears, nose, mouth, throat, and face: Denies mucositis or sore throat Respiratory: Denies cough, dyspnea or wheezes Cardiovascular: Denies palpitation, chest discomfort Gastrointestinal:  Denies nausea, heartburn or change in bowel habits Skin: Denies abnormal skin rashes Lymphatics: Denies new lymphadenopathy or easy bruising Neurological:Denies numbness, tingling or new weaknesses Behavioral/Psych: Mood is stable, no new changes  Extremities: No lower extremity edema   All other systems were reviewed with the patient and are negative.  I have reviewed the past medical history, past surgical history, social history and family history with the patient and they are unchanged from previous note.  ALLERGIES:  is allergic to pollen extract.  MEDICATIONS:  Current Outpatient Medications  Medication Sig Dispense Refill  . aspirin 81 MG chewable tablet Chew by mouth.    [name] atorvastatin (LIPITOR) 40 MG tablet TAKE 1  TABLET(40 MG) BY MOUTH DAILY 90 tablet 1  . Blood Glucose Monitoring Suppl (ONETOUCH VERIO) w/Device KIT 1 Device by Does not apply route 2 (two) times daily as needed. 1 kit 0  . dapagliflozin propanediol (FARXIGA) 5 MG TABS tablet Take 5 mg by mouth daily. 90 tablet 0  . Erenumab-aooe (AIMOVIG) 140 MG/ML SOAJ Inject 140 mg into the skin every 30 (thirty) days.    [name] gabapentin (NEURONTIN) 600 MG tablet Take 600 mg by mouth as needed.     [name] ibuprofen (ADVIL,MOTRIN) 600 MG tablet Take 1 tablet (600 mg total) by mouth every 8 (eight) hours as needed. 30 tablet 0  . insulin degludec (TRESIBA FLEXTOUCH) 100 UNIT/ML SOPN FlexTouch Pen Inject 0.15 mLs (15 Units total) into the skin daily. And pen needles 1/day 5 pen 11  . Insulin Pen Needle 32G X 4 MM MISC Used to inject insulin one time daily. 100 each 11  . losartan-hydrochlorothiazide (HYZAAR) 100-25 MG tablet Take 1 tablet by mouth daily. 90 tablet 1  . metFORMIN (GLUCOPHAGE) 1000 MG tablet Take 1 tablet (1,000 mg total) by mouth 2 (two) times daily with a meal. 180 tablet 1  . methocarbamol (ROBAXIN) 500 MG tablet TAKE 1 TABLET BY MOUTH TWICE DAILY (Patient taking differently: Take 500 mg by mouth every 6 (six) hours as needed. ) 20 tablet 0  . omeprazole (PRILOSEC) 40 MG capsule Take 1 capsule (40 mg total) by mouth daily as needed (indigestion). 90 capsule 1  . sildenafil (REVATIO) 20 MG tablet 3-5 pills qd  As needed    . SUMAtriptan (IMITREX) 100 MG tablet Take 1 tablet (100 mg total) by mouth once  as needed for up to 1 dose. May repeat in 2 hours if headache persists or recurs. 10 tablet 12  . timolol (TIMOPTIC-XR) 0.5 % ophthalmic gel-forming Place 1 drop into both eyes daily.     [name] topiramate (TOPAMAX) 100 MG tablet Take 1 tablet (100 mg total) by mouth daily. (Patient not taking: Reported on 11/12/2017) 30 tablet 11  . verapamil (CALAN-SR) 180 MG CR tablet Take 1 tablet (180 mg total) by mouth daily. 30 tablet 2   No current  facility-administered medications for this visit.     PHYSICAL EXAMINATION: ECOG PERFORMANCE STATUS: 1 - Symptomatic but completely ambulatory  Vitals:   11/12/17 0858  BP: (!) 148/77  Pulse: 64  Resp: 18  Temp: 98.6 F (37 C)  SpO2: 100%   Filed Weights   11/12/17 0858  Weight: 212 lb (96.2 kg)    GENERAL:alert, no distress and comfortable SKIN: skin color, texture, turgor are normal, no rashes or significant lesions EYES: normal, Conjunctiva are pink and non-injected, sclera clear OROPHARYNX:no exudate, no erythema and lips, buccal mucosa, and tongue normal  NECK: supple, thyroid normal size, non-tender, without nodularity LYMPH:  no palpable lymphadenopathy in the cervical, axillary or inguinal LUNGS: clear to auscultation and percussion with normal breathing effort HEART: regular rate & rhythm and no murmurs and no lower extremity edema ABDOMEN:abdomen soft, non-tender and normal bowel sounds MUSCULOSKELETAL:no cyanosis of digits and no clubbing  NEURO: alert & oriented x 3 with fluent speech, no focal motor/sensory deficits EXTREMITIES: No lower extremity edema    LABORATORY DATA:  I have reviewed the data as listed CMP Latest Ref Rng & Units 09/03/2017 04/05/2017 12/07/2016  Glucose 70 - 99 mg/dL 135(H) 98 104(H)  BUN 6 - 23 mg/dL _0 Creatinine 0.40 - 1.50 mg/dL 1.17 1.05 1.09  Sodium 135 - 145 mEq/L 139 142 141  Potassium 3.5 - 5.1 mEq/L 3.6 3.7 3.7  Chloride 96 - 112 mEq/L 102 103 104  CO2 19 - 32 mEq/L 29 30 33(H)  Calcium 8.4 - 10.5 mg/dL 9.2 9.7 9.5  Total Protein 6.0 - 8.3 g/dL 8.0 - 8.0  Total Bilirubin 0.2 - 1.2 mg/dL 0.4 - 0.3  Alkaline Phos 39 - 117 U/L 49 - 63  AST 0 - 37 U/L 25 - 22  ALT 0 - 53 U/L 20 - 27    Lab Results  Component Value Date   WBC 4.5 11/11/2017   HGB 11.8 (L) 11/11/2017   HCT 39.3 11/11/2017   MCV 77.1 (L) 11/11/2017   PLT 218 11/11/2017   NEUTROABS 2.8 11/11/2017    ASSESSMENT & PLAN:  Iron deficiency  anemia Severe iron deficiency anemia: Treatment: IV iron September 2019 Lab review 11/11/2017: Hemoglobin 11.8, MCV 77, RDW 27 Iron studies are pending from today. Patient had marked improvement in his symptoms. He however continues to suffer from erectile dysfunction and wonders if it is related to iron deficiency.  Colonoscopy was negative  Plan: 1.  Return to clinic in 6 weeks with labs done couple of days ahead of time. 2. I will call the patient with the results of iron studies done yesterday.  If he needs additional IV iron we will plan to administer it when he comes back to see [place] in 6 weeks.    Orders Placed This Encounter  Procedures  . Ferritin    Standing Status:   Future    Standing Expiration Date:   11/12/2018  . Iron and TIBC  Standing Status:   Future    Standing Expiration Date:   11/12/2018  . CBC with Differential (Cancer Center Only)    Standing Status:   Future    Standing Expiration Date:   11/13/2018   The patient has a good understanding of the overall plan. he agrees with it. he will call with any problems that may develop before the next visit here.   Harriette Ohara, MD 11/12/17

## 2017-11-12 NOTE — Telephone Encounter (Signed)
Gave pt avs and calendar  °

## 2017-11-12 NOTE — Patient Instructions (Addendum)
I have sent a prescription to your pharmacy, to reduce the tresiba. Please continue the same other diabetes medications check your blood sugar twice a day.  vary the time of day when you check, between before the 3 meals, and at bedtime.  also check if you have symptoms of your blood sugar being too high or too low.  please keep a record of the readings and bring it to your next appointment here (or you can bring the meter itself).  You can write it on any piece of paper.  please call us sooner if your blood sugar goes below 70, or if you have a lot of readings over 200.  Please come back for a follow-up appointment in 2 months.

## 2017-11-12 NOTE — Assessment & Plan Note (Signed)
Severe iron deficiency anemia: Treatment: IV iron September 2019 Lab review 11/11/2017: Hemoglobin 11.8, MCV 77, RDW 27 Iron studies are pending from today. Patient had marked improvement in his symptoms. He however continues to suffer from erectile dysfunction and wonders if it is related to iron deficiency.  Colonoscopy was negative  Plan: 1.  Return to clinic in 6 weeks with labs done couple of days ahead of time. 2. I will call the patient with the results of iron studies done yesterday.  If he needs additional IV iron we will plan to administer it when he comes back to see [place] in 6 weeks.

## 2017-11-12 NOTE — Progress Notes (Signed)
Subjective:    Patient ID: Jason Ortega, male    DOB: 1965-08-02, 52 y.o.   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   Interval history: He brings a record of his cbg's which I have reviewed today.  It varies from 80-150.  There is no trend throughout the day.  Past Medical History:  Diagnosis Date  . Allergy   . Anemia   . Arthritis   . Cataract    bilateral cataracts removed  . Depression   . Diabetes mellitus   . Frequent headaches   . GERD (gastroesophageal reflux disease)   . Glaucoma   . Hypercholesteremia   . Hypertension   . Migraines   . Sleep apnea    wears c-PAP    Past Surgical History:  Procedure Laterality Date  . bilateral cataracts removed    . CARDIAC CATHETERIZATION  12/2011  . COLONOSCOPY    . LEFT HEART CATHETERIZATION WITH CORONARY ANGIOGRAM N/A 11/12/2011   Procedure: LEFT HEART CATHETERIZATION WITH CORONARY ANGIOGRAM;  Surgeon: Laverda Page, MD;  Location: [facility] CATH LAB;  Service: Cardiovascular;  Laterality: N/A;  . SHOULDER SURGERY      Social History   Socioeconomic History  . Marital status: Married    Spouse name: Not on file  . Number of children: 2  . Years of education: 65  . Highest education level: Not on file  Occupational History  . Occupation: Clinical research associate  Social Needs  . Financial resource strain: Not on file  . Food insecurity:    Worry: Not on file    Inability: Not on file  . Transportation needs:    Medical: Not on file    Non-medical: Not on file  Tobacco Use  . Smoking status: Never Smoker  . Smokeless tobacco: Never Used  Substance and Sexual Activity  . Alcohol use: Yes   Comment: occasional  . Drug use: No  . Sexual activity: Yes    Birth control/protection: None  Lifestyle  . Physical activity:    Days per week: Not on file    Minutes per session: Not on file  . Stress: Not on file  Relationships  . Social connections:    Talks on phone: Not on file    Gets together: Not on file    Attends religious service: Not on file    Active member of club or organization: Not on file    Attends meetings of clubs or organizations: Not on file    Relationship status: Not on file  . Intimate partner violence:    Fear of current or ex partner: Not on file    Emotionally abused: Not on file    Physically abused: Not on file    Forced sexual activity: Not on file  Other Topics Concern  . Not on file  Social History Narrative   Fun/Hobby: Coach football    Current Outpatient Medications on File Prior to Visit  Medication Sig Dispense Refill  . atorvastatin (LIPITOR) 40 MG tablet TAKE 1 TABLET(40 MG) BY MOUTH DAILY 90 tablet 1  . Blood Glucose Monitoring Suppl (ONETOUCH VERIO) w/Device KIT 1 Device by Does not apply route 2 (two) times daily as needed. 1  kit 0  . dapagliflozin propanediol (FARXIGA) 5 MG TABS tablet Take 5 mg by mouth daily. 90 tablet 0  . gabapentin (NEURONTIN) 600 MG tablet Take 600 mg by mouth as needed.     [name] ibuprofen (ADVIL,MOTRIN) 600 MG tablet Take 1 tablet (600 mg total) by mouth every 8 (eight) hours as needed. 30 tablet 0  . Insulin Pen Needle 32G X 4 MM MISC Used to inject insulin one time daily. 100 each 11  . losartan-hydrochlorothiazide (HYZAAR) 100-25 MG tablet Take 1 tablet by mouth daily. 90 tablet 1  . metFORMIN (GLUCOPHAGE) 1000 MG tablet Take 1 tablet (1,000 mg total) by mouth 2 (two) times daily with a meal. 180 tablet 1  . methocarbamol (ROBAXIN) 500 MG tablet TAKE 1 TABLET BY MOUTH TWICE DAILY (Patient taking differently: Take 500 mg by mouth every 6 (six) hours as needed. ) 20 tablet 0  . omeprazole (PRILOSEC) 40 MG  capsule Take 1 capsule (40 mg total) by mouth daily as needed (indigestion). 90 capsule 1  . sildenafil (REVATIO) 20 MG tablet 3-5 pills qd  As needed    . SUMAtriptan (IMITREX) 100 MG tablet Take 1 tablet (100 mg total) by mouth once as needed for up to 1 dose. May repeat in 2 hours if headache persists or recurs. 10 tablet 12  . timolol (TIMOPTIC-XR) 0.5 % ophthalmic gel-forming Place 1 drop into both eyes daily.     . verapamil (CALAN-SR) 180 MG CR tablet Take 1 tablet (180 mg total) by mouth daily. 30 tablet 2  . aspirin 81 MG chewable tablet Chew by mouth.    Eduard Roux (AIMOVIG) 140 MG/ML SOAJ Inject 140 mg into the skin every 30 (thirty) days.    [name] topiramate (TOPAMAX) 100 MG tablet Take 1 tablet (100 mg total) by mouth daily. (Patient not taking: Reported on 11/12/2017) 30 tablet 11   No current facility-administered medications on file prior to visit.     Allergies  Allergen Reactions  . Pollen Extract Other (See Comments)    Stuffy nose    Family History  Problem Relation Age of Onset  . Arthritis Mother   . Stroke Mother   . Hypertension Father   . Diabetes Father   . Cancer Father        Multiple Myleoma  . Diabetes Maternal Grandfather   . Colon cancer Neg Hx   . Esophageal cancer Neg Hx   . Rectal cancer Neg Hx   . Stomach cancer Neg Hx     BP (!) 152/90   Pulse 73   Ht _0  (1.702 m)   Wt 211 lb 9.6 oz (96 kg)   SpO2 98%   BMI 33.14 kg/m    Review of Systems He denies hypoglycemia.      Objective:   Physical Exam VITAL SIGNS:  See vs page.  GENERAL: no distress.  Pulses: dorsalis pedis intact bilat.   MSK: no deformity of the feet.  CV: trace bilat leg edema.  Skin:  no ulcer on the feet, but the skin is dry.  normal color and temp on the feet. Neuro: sensation is intact to touch on the feet, but decreased from normal.   Lab Results  Component Value Date   CREATININE 1.17 09/03/2017   BUN 14 09/03/2017   NA 139 09/03/2017   K 3.6  09/03/2017   CL 102 09/03/2017   CO2 29 09/03/2017    Lab Results  Component Value Date   HGBA1C  5.5 11/12/2017      Assessment & Plan:  Type 2 DM, with polyneuropathy: well-controlled.    Patient Instructions  I have sent a prescription to your pharmacy, to reduce the tresiba. Please continue the same other diabetes medications check your blood sugar twice a day.  vary the time of day when you check, between before the 3 meals, and at bedtime.  also check if you have symptoms of your blood sugar being too high or too low.  please keep a record of the readings and bring it to your next appointment here (or you can bring the meter itself).  You can write it on any piece of paper.  please call us sooner if your blood sugar goes below 70, or if you have a lot of readings over 200.  Please come back for a follow-up appointment in 2 months.

## 2017-11-26 ENCOUNTER — Encounter: Payer: 59 | Admitting: Gastroenterology

## 2017-12-01 ENCOUNTER — Other Ambulatory Visit: Payer: Self-pay | Admitting: Nurse Practitioner

## 2017-12-01 DIAGNOSIS — E119 Type 2 diabetes mellitus without complications: Secondary | ICD-10-CM

## 2017-12-03 ENCOUNTER — Ambulatory Visit: Payer: 59 | Admitting: Family

## 2017-12-03 DIAGNOSIS — Z0289 Encounter for other administrative examinations: Secondary | ICD-10-CM

## 2017-12-04 ENCOUNTER — Encounter: Payer: Self-pay | Admitting: *Deleted

## 2017-12-04 NOTE — Telephone Encounter (Signed)
Aimovig 140 mg PA for continuation started on Cover My Meds. KEY: [id number]. Currently awaiting questions from [facility] [employer].

## 2017-12-04 NOTE — Telephone Encounter (Signed)
Answered clinical questions on Cover My Meds. Spoke with pt on phone this afternoon. He is having positive results with the Aimovig. It has helped his migraines. Anticipate determination within 24 hours.   If [employer] has not responded to your request within 24 hours, contact [employer] at [phone]. If you think there may be a problem with your PA request, use our live chat feature at the bottom right.

## 2017-12-04 NOTE — Telephone Encounter (Signed)
Received Aimovig 140 mg approval from [facility] [employer]. Medication approved from 12/04/2017 through 12/05/2018.   PA# [id number]  LN  Emailed pt through [employer].

## 2017-12-18 ENCOUNTER — Telehealth: Payer: Self-pay

## 2017-12-18 NOTE — Telephone Encounter (Signed)
Per 11/13 vm return calls.  patient requested to change his lab appointment to 9:30 am

## 2017-12-20 ENCOUNTER — Inpatient Hospital Stay: Payer: 59 | Attending: Hematology and Oncology

## 2017-12-20 ENCOUNTER — Inpatient Hospital Stay: Payer: 59

## 2017-12-20 DIAGNOSIS — D509 Iron deficiency anemia, unspecified: Secondary | ICD-10-CM | POA: Diagnosis not present

## 2017-12-20 DIAGNOSIS — Z79899 Other long term (current) drug therapy: Secondary | ICD-10-CM | POA: Insufficient documentation

## 2017-12-20 DIAGNOSIS — D500 Iron deficiency anemia secondary to blood loss (chronic): Secondary | ICD-10-CM

## 2017-12-20 DIAGNOSIS — R5383 Other fatigue: Secondary | ICD-10-CM | POA: Diagnosis not present

## 2017-12-20 LAB — IRON AND TIBC
IRON: 49 ug/dL (ref 42–163)
Saturation Ratios: 16 % — ABNORMAL LOW (ref 20–55)
TIBC: 313 ug/dL (ref 202–409)
UIBC: 264 ug/dL (ref 117–376)

## 2017-12-20 LAB — CBC WITH DIFFERENTIAL (CANCER CENTER ONLY)
ABS IMMATURE GRANULOCYTES: 0.01 10*3/uL (ref 0.00–0.07)
Basophils Absolute: 0 10*3/uL (ref 0.0–0.1)
Basophils Relative: 1 %
EOS PCT: 3 %
Eosinophils Absolute: 0.1 10*3/uL (ref 0.0–0.5)
HCT: 42.5 % (ref 39.0–52.0)
HEMOGLOBIN: 12.6 g/dL — AB (ref 13.0–17.0)
IMMATURE GRANULOCYTES: 0 %
LYMPHS PCT: 34 %
Lymphs Abs: 1.3 10*3/uL (ref 0.7–4.0)
MCH: 24 pg — ABNORMAL LOW (ref 26.0–34.0)
MCHC: 29.6 g/dL — AB (ref 30.0–36.0)
MCV: 80.8 fL (ref 80.0–100.0)
Monocytes Absolute: 0.5 10*3/uL (ref 0.1–1.0)
Monocytes Relative: 14 %
NEUTROS ABS: 1.8 10*3/uL (ref 1.7–7.7)
NRBC: 0 % (ref 0.0–0.2)
Neutrophils Relative %: 48 %
Platelet Count: 281 10*3/uL (ref 150–400)
RBC: 5.26 MIL/uL (ref 4.22–5.81)
RDW: 21.4 % — ABNORMAL HIGH (ref 11.5–15.5)
WBC: 3.8 10*3/uL — AB (ref 4.0–10.5)

## 2017-12-20 LAB — FERRITIN: FERRITIN: 38 ng/mL (ref 24–336)

## 2017-12-24 ENCOUNTER — Ambulatory Visit (INDEPENDENT_AMBULATORY_CARE_PROVIDER_SITE_OTHER): Payer: 59 | Admitting: Family

## 2017-12-24 ENCOUNTER — Encounter: Payer: Self-pay | Admitting: Family

## 2017-12-24 ENCOUNTER — Inpatient Hospital Stay (HOSPITAL_BASED_OUTPATIENT_CLINIC_OR_DEPARTMENT_OTHER): Payer: 59 | Admitting: Hematology and Oncology

## 2017-12-24 ENCOUNTER — Telehealth: Payer: Self-pay | Admitting: Hematology and Oncology

## 2017-12-24 ENCOUNTER — Inpatient Hospital Stay: Payer: 59

## 2017-12-24 VITALS — BP 160/88 | HR 62 | Temp 97.9°F | Ht 67.0 in | Wt 209.0 lb

## 2017-12-24 DIAGNOSIS — K589 Irritable bowel syndrome without diarrhea: Secondary | ICD-10-CM

## 2017-12-24 DIAGNOSIS — I10 Essential (primary) hypertension: Secondary | ICD-10-CM

## 2017-12-24 DIAGNOSIS — R5383 Other fatigue: Secondary | ICD-10-CM | POA: Diagnosis not present

## 2017-12-24 DIAGNOSIS — D509 Iron deficiency anemia, unspecified: Secondary | ICD-10-CM

## 2017-12-24 MED ORDER — VERAPAMIL HCL ER 180 MG PO TBCR: 180.0000 mg | EXTENDED_RELEASE_TABLET | Freq: Every day | ORAL | 1 refills | Status: DC

## 2017-12-24 MED ORDER — BACLOFEN 10 MG PO TABS: 10.0000 mg | ORAL_TABLET | Freq: Three times a day (TID) | ORAL | 0 refills | Status: DC

## 2017-12-24 NOTE — Progress Notes (Signed)
Jason Ortega is a 52 y.o. male with the following history as recorded in Epic[employer]:  Patient Active Problem List   Diagnosis Date Noted  . Iron deficiency anemia 10/01/2017  . Encounter for general adult medical examination with abnormal findings 08/06/2017  . Anxiety and depression 07/16/2017  . Tingling of both feet 04/08/2017  . Lightheadedness 03/26/2017  . Chronic migraine without aura, with intractable migraine, so stated, with status migrainosus 03/04/2017  . Chronic bilateral low back pain with bilateral sciatica 12/07/2016  . Type 2 diabetes mellitus without complication, without long-term current use of insulin ([place]) 06/06/2016  . Essential hypertension 06/06/2016  . Gastroesophageal reflux disease without esophagitis 06/06/2016    Current Outpatient Medications  Medication Sig Dispense Refill  . aspirin 81 MG chewable tablet Chew by mouth.    [name] atorvastatin (LIPITOR) 40 MG tablet TAKE 1 TABLET(40 MG) BY MOUTH DAILY 90 tablet 1  . Blood Glucose Monitoring Suppl (ONETOUCH VERIO) w/Device KIT 1 Device by Does not apply route 2 (two) times daily as needed. 1 kit 0  . Erenumab-aooe (AIMOVIG) 140 MG/ML SOAJ Inject 140 mg into the skin every 30 (thirty) days.    [name] FARXIGA 5 MG TABS tablet TAKE 1 TABLET BY MOUTH DAILY 90 tablet 1  . gabapentin (NEURONTIN) 600 MG tablet Take 600 mg by mouth as needed.     [name] ibuprofen (ADVIL,MOTRIN) 600 MG tablet Take 1 tablet (600 mg total) by mouth every 8 (eight) hours as needed. 30 tablet 0  . insulin degludec (TRESIBA FLEXTOUCH) 100 UNIT/ML SOPN FlexTouch Pen Inject 0.15 mLs (15 Units total) into the skin daily. And pen needles 1/day 5 pen 11  . Insulin Pen Needle 32G X 4 MM MISC Used to inject insulin one time daily. 100 each 11  . losartan-hydrochlorothiazide (HYZAAR) 100-25 MG tablet Take 1 tablet by mouth daily. 90 tablet 1  . metFORMIN (GLUCOPHAGE) 1000 MG tablet Take 1 tablet (1,000 mg total) by mouth 2 (two) times daily with a meal. 180 tablet  1  . methocarbamol (ROBAXIN) 500 MG tablet TAKE 1 TABLET BY MOUTH TWICE DAILY (Patient taking differently: Take 500 mg by mouth every 6 (six) hours as needed. ) 20 tablet 0  . omeprazole (PRILOSEC) 40 MG capsule Take 1 capsule (40 mg total) by mouth daily as needed (indigestion). 90 capsule 1  . sildenafil (REVATIO) 20 MG tablet 3-5 pills qd  As needed    . SUMAtriptan (IMITREX) 100 MG tablet Take 1 tablet (100 mg total) by mouth once as needed for up to 1 dose. May repeat in 2 hours if headache persists or recurs. 10 tablet 12  . timolol (TIMOPTIC-XR) 0.5 % ophthalmic gel-forming Place 1 drop into both eyes daily.     . verapamil (CALAN-SR) 180 MG CR tablet Take 1 tablet (180 mg total) by mouth daily. 90 tablet 1  . baclofen (LIORESAL) 10 MG tablet Take 1 tablet (10 mg total) by mouth 3 (three) times daily. Prn for IBS flare 30 each 0   No current facility-administered medications for this visit.     Allergies: Pollen extract  Past Medical History:  Diagnosis Date  . Allergy   . Anemia   . Arthritis   . Cataract    bilateral cataracts removed  . Depression   . Diabetes mellitus   . Frequent headaches   . GERD (gastroesophageal reflux disease)   . Glaucoma   . Hypercholesteremia   . Hypertension   . Migraines   . Sleep  apnea    wears c-PAP    Past Surgical History:  Procedure Laterality Date  . bilateral cataracts removed    . CARDIAC CATHETERIZATION  12/2011  . COLONOSCOPY    . LEFT HEART CATHETERIZATION WITH CORONARY ANGIOGRAM N/A 11/12/2011   Procedure: LEFT HEART CATHETERIZATION WITH CORONARY ANGIOGRAM;  Surgeon: Laverda Page, MD;  Location: [facility] CATH LAB;  Service: Cardiovascular;  Laterality: N/A;  . SHOULDER SURGERY      Family History  Problem Relation Age of Onset  . Arthritis Mother   . Stroke Mother   . Hypertension Father   . Diabetes Father   . Cancer Father        Multiple Myleoma  . Diabetes Maternal Grandfather   . Colon cancer Neg Hx   . Esophageal  cancer Neg Hx   . Rectal cancer Neg Hx   . Stomach cancer Neg Hx     Social History   Tobacco Use  . Smoking status: Never Smoker  . Smokeless tobacco: Never Used  Substance Use Topics  . Alcohol use: Yes    Comment: occasional    Subjective:  Patient presents with concerns for IBS; notes he was diagnosed with IBS "many years ago." Not currently under care of GI; Has used Baclofen with benefit; notes that he may have flares every 6 months;   Scheduled to see oncology today in follow-up regarding unexplained anemia; did not understand that he was also supposed to see GI in follow-up.   Blood pressure elevated today- realized he has not been taking his Verapamil; requesting refill today; Denies any chest pain, shortness of breath, blurred vision or headache    Objective:  Vitals:   12/24/17 0826  BP: (!) 160/88  Pulse: 62  Temp: 97.9 F (36.6 C)  TempSrc: Oral  SpO2: 96%  Weight: 209 lb 0.6 oz (94.8 kg)  Height: '5\' 7"'$  (1.702 m)    General: Well developed, well nourished, in no acute distress  Skin : Warm and dry.  Head: Normocephalic and atraumatic  Lungs: Respirations unlabored; clear to auscultation bilaterally without wheeze, rales, rhonchi  CVS exam: normal rate and regular rhythm.  Abdomen: Soft; nontender; nondistended; normoactive bowel sounds; no masses or hepatosplenomegaly  Musculoskeletal: No deformities; no active joint inflammation  Extremities: No edema, cyanosis, clubbing  Vessels: Symmetric bilaterally  Neurologic: Alert and oriented; speech intact; face symmetrical; moves all extremities well; CNII-XII intact without focal deficit   Assessment:  1. Irritable bowel syndrome, unspecified type   2. Essential hypertension   3. Iron deficiency anemia, unspecified iron deficiency anemia type     Plan:  1. Short-term refill on Baclofen as requested;  Stressed need to follow-up with GI; 2. Uncontrolled; apparently, has not been taking both of his  medications; refill updated; keep planned follow-up with his PCP; 3. Keep planned follow-up with hematology for later today; encouraged to discuss source of anemia/ planned follow-up- again encouraged GI evaluation.   No follow-ups on file.  No orders of the defined types were placed in this encounter.   Requested Prescriptions   Signed Prescriptions Disp Refills  . verapamil (CALAN-SR) 180 MG CR tablet 90 tablet 1    Sig: Take 1 tablet (180 mg total) by mouth daily.  . baclofen (LIORESAL) 10 MG tablet 30 each 0    Sig: Take 1 tablet (10 mg total) by mouth 3 (three) times daily. Prn for IBS flare

## 2017-12-24 NOTE — Assessment & Plan Note (Signed)
Severe iron deficiency anemia: Treatment: IV iron September 2019 Lab review: 12/20/2017 Hemoglobin 12.6, MCV 80.8, ferritin 38 (was 150) Iron saturation 16%  Based on these results, we did not need to give the patient IV iron at this time.  Return to clinic in 3 months with labs and follow-up

## 2017-12-24 NOTE — Patient Instructions (Signed)
Please get back on both of your blood pressure medications; refill updated; Please consider scheduling a follow-up with Dr. Russella DarStark; discuss with Dr. Pamelia HoitGudena today;

## 2017-12-24 NOTE — Telephone Encounter (Signed)
Gave avs and calendar ° °

## 2017-12-24 NOTE — Progress Notes (Signed)
Patient Care Team: Lance Sell, NP as PCP - General (Nurse Practitioner)  DIAGNOSIS: Iron deficiency anemia, unspecified iron deficiency anemia type - Plan: Iron and TIBC, Ferritin, CBC with Differential (Cancer Center Only)  CHIEF COMPLIANT: Follow-up of iron deficiency anemia   INTERVAL HISTORY: Jason Ortega is a 52 y.o. with above-mentioned history of iron deficiency anemia and has received 2 prior doses of IV iron in 10/2017.  He presents to the clinic today alone. He notes he is feeling well despite moderate fatigue, which he attributes to working a lot. Hg is 12.6, Iron 49. He denies any obvious bleeding.   REVIEW OF SYSTEMS:   Constitutional: Denies fevers, chills or abnormal weight loss, (+) moderate fatigue Eyes: Denies blurriness of vision Ears, nose, mouth, throat, and face: Denies mucositis or sore throat Respiratory: Denies cough, dyspnea or wheezes Cardiovascular: Denies palpitation, chest discomfort Gastrointestinal:  Denies nausea, heartburn or change in bowel habits Skin: Denies abnormal skin rashes Lymphatics: Denies new lymphadenopathy or easy bruising Neurological:Denies numbness, tingling or new weaknesses Behavioral/Psych: Mood is stable, no new changes  Extremities: No lower extremity edema All other systems were reviewed with the patient and are negative.  I have reviewed the past medical history, past surgical history, social history and family history with the patient and they are unchanged from previous note.  ALLERGIES:  is allergic to pollen extract.  MEDICATIONS:  Current Outpatient Medications  Medication Sig Dispense Refill  . aspirin 81 MG chewable tablet Chew by mouth.    [name] atorvastatin (LIPITOR) 40 MG tablet TAKE 1 TABLET(40 MG) BY MOUTH DAILY 90 tablet 1  . baclofen (LIORESAL) 10 MG tablet Take 1 tablet (10 mg total) by mouth 3 (three) times daily. Prn for IBS flare 30 each 0  . Blood Glucose Monitoring Suppl (ONETOUCH VERIO) w/Device  KIT 1 Device by Does not apply route 2 (two) times daily as needed. 1 kit 0  . Erenumab-aooe (AIMOVIG) 140 MG/ML SOAJ Inject 140 mg into the skin every 30 (thirty) days.    [name] FARXIGA 5 MG TABS tablet TAKE 1 TABLET BY MOUTH DAILY 90 tablet 1  . gabapentin (NEURONTIN) 600 MG tablet Take 600 mg by mouth as needed.     [name] ibuprofen (ADVIL,MOTRIN) 600 MG tablet Take 1 tablet (600 mg total) by mouth every 8 (eight) hours as needed. 30 tablet 0  . insulin degludec (TRESIBA FLEXTOUCH) 100 UNIT/ML SOPN FlexTouch Pen Inject 0.15 mLs (15 Units total) into the skin daily. And pen needles 1/day 5 pen 11  . Insulin Pen Needle 32G X 4 MM MISC Used to inject insulin one time daily. 100 each 11  . losartan-hydrochlorothiazide (HYZAAR) 100-25 MG tablet Take 1 tablet by mouth daily. 90 tablet 1  . metFORMIN (GLUCOPHAGE) 1000 MG tablet Take 1 tablet (1,000 mg total) by mouth 2 (two) times daily with a meal. 180 tablet 1  . methocarbamol (ROBAXIN) 500 MG tablet TAKE 1 TABLET BY MOUTH TWICE DAILY (Patient taking differently: Take 500 mg by mouth every 6 (six) hours as needed. ) 20 tablet 0  . omeprazole (PRILOSEC) 40 MG capsule Take 1 capsule (40 mg total) by mouth daily as needed (indigestion). 90 capsule 1  . sildenafil (REVATIO) 20 MG tablet 3-5 pills qd  As needed    . SUMAtriptan (IMITREX) 100 MG tablet Take 1 tablet (100 mg total) by mouth once as needed for up to 1 dose. May repeat in 2 hours if headache persists or recurs. 10 tablet 12  .  timolol (TIMOPTIC-XR) 0.5 % ophthalmic gel-forming Place 1 drop into both eyes daily.     . verapamil (CALAN-SR) 180 MG CR tablet Take 1 tablet (180 mg total) by mouth daily. 90 tablet 1   No current facility-administered medications for this visit.     PHYSICAL EXAMINATION: ECOG PERFORMANCE STATUS: 1 - Symptomatic but completely ambulatory  Vitals:   12/24/17 0931  BP: (!) 160/91  Pulse: (!) 53  Resp: 18  Temp: (!) 97.5 F (36.4 C)  SpO2: 100%   Filed Weights    12/24/17 0931  Weight: 209 lb 9.6 oz (95.1 kg)    GENERAL:alert, no distress and comfortable SKIN: skin color, texture, turgor are normal, no rashes or significant lesions EYES: normal, Conjunctiva are pink and non-injected, sclera clear OROPHARYNX:no exudate, no erythema and lips, buccal mucosa, and tongue normal  NECK: supple, thyroid normal size, non-tender, without nodularity LYMPH:  no palpable lymphadenopathy in the cervical, axillary or inguinal LUNGS: clear to auscultation and percussion with normal breathing effort HEART: regular rate & rhythm and no murmurs and no lower extremity edema ABDOMEN:abdomen soft, non-tender and normal bowel sounds MUSCULOSKELETAL:no cyanosis of digits and no clubbing  NEURO: alert & oriented x 3 with fluent speech, no focal motor/sensory deficits EXTREMITIES: No lower extremity edema  LABORATORY DATA:  I have reviewed the data as listed CMP Latest Ref Rng & Units 09/03/2017 04/05/2017 12/07/2016  Glucose 70 - 99 mg/dL 135(H) 98 104(H)  BUN 6 - 23 mg/dL '14 14 11  '$ Creatinine 0.40 - 1.50 mg/dL 1.17 1.05 1.09  Sodium 135 - 145 mEq/L 139 142 141  Potassium 3.5 - 5.1 mEq/L 3.6 3.7 3.7  Chloride 96 - 112 mEq/L 102 103 104  CO2 19 - 32 mEq/L 29 30 33(H)  Calcium 8.4 - 10.5 mg/dL 9.2 9.7 9.5  Total Protein 6.0 - 8.3 g/dL 8.0 - 8.0  Total Bilirubin 0.2 - 1.2 mg/dL 0.4 - 0.3  Alkaline Phos 39 - 117 U/L 49 - 63  AST 0 - 37 U/L 25 - 22  ALT 0 - 53 U/L 20 - 27    Lab Results  Component Value Date   WBC 3.8 (L) 12/20/2017   HGB 12.6 (L) 12/20/2017   HCT 42.5 12/20/2017   MCV 80.8 12/20/2017   PLT 281 12/20/2017   NEUTROABS 1.8 12/20/2017    ASSESSMENT & PLAN:  Iron deficiency anemia Severe iron deficiency anemia: Treatment: IV iron September 2019 Lab review: 12/20/2017 Hemoglobin 12.6, MCV 80.8, ferritin 38 (was 150) Iron saturation 16%  Based on these results, we did not need to give the patient IV iron at this time.  Return to clinic in 3  months with labs and follow-up    Orders Placed This Encounter  Procedures  . Iron and TIBC    Standing Status:   Future    Standing Expiration Date:   12/24/2018  . Ferritin    Standing Status:   Future    Standing Expiration Date:   12/24/2018  . CBC with Differential (Cancer Center Only)    Standing Status:   Future    Standing Expiration Date:   12/25/2018   The patient has a good understanding of the overall plan. he agrees with it. he will call with any problems that may develop before the next visit here.   Harriette Ohara, MD 12/24/2017    I, Cloyde Reams Dorshimer, am acting as scribe for Nicholas Lose, MD.  I have reviewed the above documentation for accuracy  and completeness, and I agree with the above.

## 2018-01-03 ENCOUNTER — Other Ambulatory Visit: Payer: Self-pay | Admitting: Nurse Practitioner

## 2018-01-05 ENCOUNTER — Encounter: Payer: Self-pay | Admitting: Nurse Practitioner

## 2018-01-14 ENCOUNTER — Ambulatory Visit: Payer: 59 | Admitting: Endocrinology

## 2018-01-17 ENCOUNTER — Encounter: Payer: Self-pay | Admitting: Endocrinology

## 2018-01-17 ENCOUNTER — Ambulatory Visit (INDEPENDENT_AMBULATORY_CARE_PROVIDER_SITE_OTHER): Payer: 59 | Admitting: Endocrinology

## 2018-01-17 VITALS — BP 138/78 | HR 69 | Ht 67.0 in | Wt 206.8 lb

## 2018-01-17 DIAGNOSIS — E119 Type 2 diabetes mellitus without complications: Secondary | ICD-10-CM

## 2018-01-17 DIAGNOSIS — I10 Essential (primary) hypertension: Secondary | ICD-10-CM

## 2018-01-17 LAB — POCT GLYCOSYLATED HEMOGLOBIN (HGB A1C): Hemoglobin A1C: 7.6 % — AB (ref 4.0–5.6)

## 2018-01-17 MED ORDER — INSULIN DEGLUDEC 100 UNIT/ML ~~LOC~~ SOPN: 10.0000 [IU] | PEN_INJECTOR | Freq: Every day | SUBCUTANEOUS | 11 refills | Status: DC

## 2018-01-17 NOTE — Patient Instructions (Addendum)
I have sent a prescription to your pharmacy, to reduce the tresiba again. Please continue the same diabetes pills.  check your blood sugar twice a day.  vary the time of day when you check, between before the 3 meals, and at bedtime.  also check if you have symptoms of your blood sugar being too high or too low.  please keep a record of the readings and bring it to your next appointment here (or you can bring the meter itself).  You can write it on any piece of paper.  please call us sooner if your blood sugar goes below 70, or if you have a lot of readings over 200.  Please come back for a follow-up appointment in 2 months.   

## 2018-01-17 NOTE — Progress Notes (Signed)
Subjective:    Patient ID: Jason Ortega, male    DOB: 06-May-1965, 52 y.o.   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   Interval history: no cbg record, but states cbg's vary from 100-150.  There is no trend throughout the day.  He does not take bydureon.   Past Medical History:  Diagnosis Date  . Allergy   . Anemia   . Arthritis   . Cataract    bilateral cataracts removed  . Depression   . Diabetes mellitus   . Frequent headaches   . GERD (gastroesophageal reflux disease)   . Glaucoma   . Hypercholesteremia   . Hypertension   . Migraines   . Sleep apnea    wears c-PAP    Past Surgical History:  Procedure Laterality Date  . bilateral cataracts removed    . CARDIAC CATHETERIZATION  12/2011  . COLONOSCOPY    . LEFT HEART CATHETERIZATION WITH CORONARY ANGIOGRAM N/A 11/12/2011   Procedure: LEFT HEART CATHETERIZATION WITH CORONARY ANGIOGRAM;  Surgeon: Laverda Page, MD;  Location: [facility] CATH LAB;  Service: Cardiovascular;  Laterality: N/A;  . SHOULDER SURGERY      Social History   Socioeconomic History  . Marital status: Married    Spouse name: Not on file  . Number of children: 2  . Years of education: 69  . Highest education level: Not on file  Occupational History  . Occupation: Clinical research associate  Social Needs  . Financial resource strain: Not on file  . Food insecurity:    Worry: Not on file    Inability: Not on file  . Transportation needs:    Medical: Not on file    Non-medical: Not on file  Tobacco Use  . Smoking status: Never Smoker  . Smokeless tobacco: Never Used  Substance and Sexual Activity  . Alcohol use: Yes    Comment:  occasional  . Drug use: No  . Sexual activity: Yes    Birth control/protection: None  Lifestyle  . Physical activity:    Days per week: Not on file    Minutes per session: Not on file  . Stress: Not on file  Relationships  . Social connections:    Talks on phone: Not on file    Gets together: Not on file    Attends religious service: Not on file    Active member of club or organization: Not on file    Attends meetings of clubs or organizations: Not on file    Relationship status: Not on file  . Intimate partner violence:    Fear of current or ex partner: Not on file    Emotionally abused: Not on file    Physically abused: Not on file    Forced sexual activity: Not on file  Other Topics Concern  . Not on file  Social History Narrative   Fun/Hobby: Coach football    Current Outpatient Medications on File Prior to Visit  Medication Sig Dispense Refill  . aspirin 81 MG chewable tablet Chew by mouth.    [name] atorvastatin (LIPITOR) 40 MG tablet TAKE 1 TABLET(40 MG) BY MOUTH DAILY 90 tablet 1  . baclofen (LIORESAL) 10 MG tablet Take 1 tablet (10 mg  total) by mouth 3 (three) times daily. Prn for IBS flare 30 each 0  . Blood Glucose Monitoring Suppl (ONETOUCH VERIO) w/Device KIT 1 Device by Does not apply route 2 (two) times daily as needed. 1 kit 0  . Erenumab-aooe (AIMOVIG) 140 MG/ML SOAJ Inject 140 mg into the skin every 30 (thirty) days.    [name] FARXIGA 5 MG TABS tablet TAKE 1 TABLET BY MOUTH DAILY 90 tablet 1  . gabapentin (NEURONTIN) 600 MG tablet Take 600 mg by mouth as needed.     [name] ibuprofen (ADVIL,MOTRIN) 600 MG tablet Take 1 tablet (600 mg total) by mouth every 8 (eight) hours as needed. 30 tablet 0  . losartan-hydrochlorothiazide (HYZAAR) 100-25 MG tablet Take 1 tablet by mouth daily. 90 tablet 1  . metFORMIN (GLUCOPHAGE) 1000 MG tablet Take 1 tablet (1,000 mg total) by mouth 2 (two) times daily with a meal. 180 tablet 1  . methocarbamol (ROBAXIN) 500 MG tablet TAKE 1 TABLET BY  MOUTH TWICE DAILY (Patient taking differently: Take 500 mg by mouth every 6 (six) hours as needed. ) 20 tablet 0  . omeprazole (PRILOSEC) 40 MG capsule Take 1 capsule (40 mg total) by mouth daily as needed (indigestion). 90 capsule 1  . sildenafil (REVATIO) 20 MG tablet Take 20 mg by mouth. 3-5 tabs prn    . SUMAtriptan (IMITREX) 100 MG tablet Take 1 tablet (100 mg total) by mouth once as needed for up to 1 dose. May repeat in 2 hours if headache persists or recurs. 10 tablet 12  . timolol (TIMOPTIC-XR) 0.5 % ophthalmic gel-forming Place 1 drop into both eyes daily.     . verapamil (CALAN-SR) 180 MG CR tablet Take 1 tablet (180 mg total) by mouth daily. 90 tablet 1   No current facility-administered medications on file prior to visit.     Allergies  Allergen Reactions  . Pollen Extract Other (See Comments)    Stuffy nose    Family History  Problem Relation Age of Onset  . Arthritis Mother   . Stroke Mother   . Hypertension Father   . Diabetes Father   . Cancer Father        Multiple Myleoma  . Diabetes Maternal Grandfather   . Colon cancer Neg Hx   . Esophageal cancer Neg Hx   . Rectal cancer Neg Hx   . Stomach cancer Neg Hx     BP 138/78 (BP Location: Left Arm, Patient Position: Sitting, Cuff Size: Normal)   Pulse 69   Ht '5\' 7"'$  (1.702 m)   Wt 206 lb 12.8 oz (93.8 kg)   SpO2 97%   BMI 32.39 kg/m   Review of Systems He denies hypoglycemia    Objective:   Physical Exam VITAL SIGNS:  See vs page.  GENERAL: no distress.  Pulses: dorsalis pedis intact bilat.   MSK: no deformity of the feet.  CV: trace bilat leg edema.  Skin:  no ulcer on the feet, but the skin is dry.  normal color and temp on the feet. Neuro: sensation is intact to touch on the feet, but decreased from normal.  Ext: There is bilateral onychomycosis of the toenails.    Lab Results  Component Value Date   CREATININE 1.17 09/03/2017   BUN 14 09/03/2017   NA 139 09/03/2017   K 3.6 09/03/2017   CL  102 09/03/2017   CO2 29 09/03/2017   A1c=5.6%     Assessment & Plan:  Insulin-requiring type 2 DM:  Insulin need is declining.  He may be able to d/c insulin.   Edema: this limits rx options.   Patient Instructions  I have sent a prescription to your pharmacy, to reduce the tresiba again. Please continue the same diabetes pills.  check your blood sugar twice a day.  vary the time of day when you check, between before the 3 meals, and at bedtime.  also check if you have symptoms of your blood sugar being too high or too low.  please keep a record of the readings and bring it to your next appointment here (or you can bring the meter itself).  You can write it on any piece of paper.  please call us sooner if your blood sugar goes below 70, or if you have a lot of readings over 200.  Please come back for a follow-up appointment in 2 months.

## 2018-01-21 ENCOUNTER — Other Ambulatory Visit: Payer: Self-pay

## 2018-01-21 DIAGNOSIS — D508 Other iron deficiency anemias: Secondary | ICD-10-CM

## 2018-01-22 ENCOUNTER — Telehealth: Payer: Self-pay

## 2018-01-22 ENCOUNTER — Inpatient Hospital Stay: Payer: 59 | Attending: Hematology and Oncology

## 2018-01-22 DIAGNOSIS — D509 Iron deficiency anemia, unspecified: Secondary | ICD-10-CM | POA: Diagnosis not present

## 2018-01-22 DIAGNOSIS — D508 Other iron deficiency anemias: Secondary | ICD-10-CM

## 2018-01-22 LAB — CBC WITH DIFFERENTIAL (CANCER CENTER ONLY)
Abs Immature Granulocytes: 0.02 10*3/uL (ref 0.00–0.07)
Basophils Absolute: 0 10*3/uL (ref 0.0–0.1)
Basophils Relative: 1 %
EOS PCT: 5 %
Eosinophils Absolute: 0.3 10*3/uL (ref 0.0–0.5)
HCT: 42.7 % (ref 39.0–52.0)
Hemoglobin: 13 g/dL (ref 13.0–17.0)
Immature Granulocytes: 0 %
Lymphocytes Relative: 40 %
Lymphs Abs: 2.1 10*3/uL (ref 0.7–4.0)
MCH: 24.9 pg — ABNORMAL LOW (ref 26.0–34.0)
MCHC: 30.4 g/dL (ref 30.0–36.0)
MCV: 81.8 fL (ref 80.0–100.0)
Monocytes Absolute: 0.5 10*3/uL (ref 0.1–1.0)
Monocytes Relative: 10 %
Neutro Abs: 2.3 10*3/uL (ref 1.7–7.7)
Neutrophils Relative %: 44 %
Platelet Count: 266 10*3/uL (ref 150–400)
RBC: 5.22 MIL/uL (ref 4.22–5.81)
RDW: 17.4 % — ABNORMAL HIGH (ref 11.5–15.5)
WBC Count: 5.3 10*3/uL (ref 4.0–10.5)
nRBC: 0.6 % — ABNORMAL HIGH (ref 0.0–0.2)

## 2018-01-22 NOTE — Telephone Encounter (Signed)
Pt called to request a lab appt to have his cbc and iron levels checked. Pt states that he is feeling very fatigue and now has shortness of breath. This is usually a sign that his iron levels are getting low. Made lab appt for this afternooon at 2pm. Pt confirmed time/date.

## 2018-01-23 ENCOUNTER — Telehealth: Payer: Self-pay | Admitting: Neurology

## 2018-01-23 LAB — IRON AND TIBC
IRON: 60 ug/dL (ref 42–163)
Saturation Ratios: 18 % — ABNORMAL LOW (ref 20–55)
TIBC: 323 ug/dL (ref 202–409)
UIBC: 263 ug/dL (ref 117–376)

## 2018-01-23 LAB — FERRITIN: Ferritin: 25 ng/mL (ref 24–336)

## 2018-01-23 NOTE — Telephone Encounter (Signed)
Spoke with Dr. Lucia GaskinsAhern and infusion suite RN. Called pt and discussed. He stated this is his typical migraine, no new symptoms. Pt advised that he can come now for infusion. He verbalized appreciation. He will have a driver. He took Fioricet this morning. He denies any allergies. He understands he may potentially be given Depakote, Toradol, Compazine. He does have Diabetes. He will come now.   Order form initiated and insurance card printed.

## 2018-01-23 NOTE — Telephone Encounter (Signed)
Orders: Depacon 1 gram IV x 1 Toradol 30 mg IV x 1  If pt has driver, may add Compazine per Dr. Lucia GaskinsAhern.

## 2018-01-23 NOTE — Telephone Encounter (Signed)
Pt requesting a call stating he would like to come in for an infusion today stating he has had a migraine for about a day and a half now medication SUMAtriptan (IMITREX) 100 MG tablet hasn't helped please advise.

## 2018-01-28 ENCOUNTER — Encounter: Payer: Self-pay | Admitting: Hematology and Oncology

## 2018-02-01 ENCOUNTER — Other Ambulatory Visit: Payer: Self-pay | Admitting: Nurse Practitioner

## 2018-02-03 MED ORDER — LOSARTAN POTASSIUM-HCTZ 100-25 MG PO TABS: 1.0000 | ORAL_TABLET | Freq: Every day | ORAL | 1 refills | Status: DC

## 2018-02-03 NOTE — Addendum Note (Signed)
Addended by: Merrilyn PumaSIMMONS, Kessa Fairbairn N on: 02/03/2018 02:22 PM   Modules accepted: Orders

## 2018-02-10 DIAGNOSIS — M25572 Pain in left ankle and joints of left foot: Secondary | ICD-10-CM | POA: Insufficient documentation

## 2018-02-10 DIAGNOSIS — M94261 Chondromalacia, right knee: Secondary | ICD-10-CM | POA: Insufficient documentation

## 2018-02-10 DIAGNOSIS — M2242 Chondromalacia patellae, left knee: Secondary | ICD-10-CM | POA: Insufficient documentation

## 2018-02-24 ENCOUNTER — Other Ambulatory Visit: Payer: Self-pay | Admitting: Nurse Practitioner

## 2018-03-01 ENCOUNTER — Other Ambulatory Visit: Payer: Self-pay | Admitting: Nurse Practitioner

## 2018-03-06 ENCOUNTER — Telehealth: Payer: Self-pay | Admitting: Neurology

## 2018-03-06 NOTE — Telephone Encounter (Signed)
I spoke with the patient and discussed that even if his insurance no longer approves the Aimovig, the savings card that he got back in June should still be good for total of 12 months and allow him to get the medication for discount. He said his current copay is $117. The pt stated he would check with his pharmacy again and will let us know if he has any further issues. He is aware that the only other CGRP is Emgality. He is open to having that ordered if he cannot get the Aimovig. He also requested a prescription for Fioricet and I advised that Dr. Lucia Gaskins will not prescribe that as it is not helpful for migraines and can actually cause rebound and worsened headaches. He verbalized understanding and appreciation for the call.

## 2018-03-06 NOTE — Telephone Encounter (Signed)
Pt states his insurance is no longer covering his  Erenumab-aooe (AIMOVIG) 140 MG/ML SOAJ So he'd like to know if there is another injection Dr Lucia Gaskins could recommend for him,  Please call

## 2018-03-18 ENCOUNTER — Telehealth: Payer: Self-pay | Admitting: Endocrinology

## 2018-03-18 ENCOUNTER — Ambulatory Visit: Payer: 59 | Admitting: Endocrinology

## 2018-03-18 NOTE — Telephone Encounter (Signed)
Patient no showed today's appt. Please advise on how to follow up. °A. No follow up necessary. °B. Follow up urgent. Contact patient immediately. °C. Follow up necessary. Contact patient and schedule visit in ___ days. °D. Follow up advised. Contact patient and schedule visit in ____weeks. ° °Would you like the NS fee to be applied to this visit? ° °

## 2018-03-18 NOTE — Telephone Encounter (Signed)
Please refer to Dr. Ellison's response 

## 2018-03-18 NOTE — Telephone Encounter (Signed)
Please schedule f/u appt for next available appointment  

## 2018-03-19 NOTE — Telephone Encounter (Signed)
Patient is rescheduled for 03/26/18 at 1:45 p.m.

## 2018-03-20 ENCOUNTER — Inpatient Hospital Stay: Payer: 59 | Attending: Hematology and Oncology

## 2018-03-20 DIAGNOSIS — D509 Iron deficiency anemia, unspecified: Secondary | ICD-10-CM | POA: Insufficient documentation

## 2018-03-21 ENCOUNTER — Other Ambulatory Visit: Payer: Self-pay

## 2018-03-21 ENCOUNTER — Telehealth: Payer: Self-pay

## 2018-03-21 DIAGNOSIS — D509 Iron deficiency anemia, unspecified: Secondary | ICD-10-CM

## 2018-03-21 NOTE — Progress Notes (Signed)
Pt called and would like to reschedule missed appt from yesterday. Rescheduled lab for Monday 03/24/2018. Conifrmed MD and infusion for iron appt on 03/26/2018.

## 2018-03-21 NOTE — Telephone Encounter (Signed)
error 

## 2018-03-24 ENCOUNTER — Inpatient Hospital Stay: Payer: 59

## 2018-03-24 DIAGNOSIS — D509 Iron deficiency anemia, unspecified: Secondary | ICD-10-CM

## 2018-03-24 LAB — CBC WITH DIFFERENTIAL (CANCER CENTER ONLY)
Abs Immature Granulocytes: 0.01 10*3/uL (ref 0.00–0.07)
BASOS PCT: 1 %
Basophils Absolute: 0 10*3/uL (ref 0.0–0.1)
Eosinophils Absolute: 0.1 10*3/uL (ref 0.0–0.5)
Eosinophils Relative: 3 %
HCT: 43.2 % (ref 39.0–52.0)
Hemoglobin: 13.3 g/dL (ref 13.0–17.0)
Immature Granulocytes: 0 %
Lymphocytes Relative: 34 %
Lymphs Abs: 1.5 10*3/uL (ref 0.7–4.0)
MCH: 25.7 pg — ABNORMAL LOW (ref 26.0–34.0)
MCHC: 30.8 g/dL (ref 30.0–36.0)
MCV: 83.6 fL (ref 80.0–100.0)
Monocytes Absolute: 0.4 10*3/uL (ref 0.1–1.0)
Monocytes Relative: 10 %
Neutro Abs: 2.3 10*3/uL (ref 1.7–7.7)
Neutrophils Relative %: 52 %
PLATELETS: 250 10*3/uL (ref 150–400)
RBC: 5.17 MIL/uL (ref 4.22–5.81)
RDW: 15.9 % — ABNORMAL HIGH (ref 11.5–15.5)
WBC Count: 4.3 10*3/uL (ref 4.0–10.5)
nRBC: 0 % (ref 0.0–0.2)

## 2018-03-25 NOTE — Progress Notes (Signed)
Patient Care Team: Lance Sell, NP as PCP - General (Nurse Practitioner)  DIAGNOSIS:    ICD-10-CM   1. Other iron deficiency anemia D50.8 Ferritin    Iron and TIBC    CBC with Differential (Cancer Center Only)    CHIEF COMPLIANT: Follow-up of iron deficiency anemia  INTERVAL HISTORY: Jason Ortega is a 53 y.o. with above-mentioned history of iron deficiency anemia previously treated with IV iron in 10/2017. His labs from 03/24/18 show Hg 13.3, RDW 15.9, iron saturation 19%, ferritin 33. He presents to the clinic alone today and is doing well. 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   REVIEW OF SYSTEMS:   Constitutional: Denies fevers, chills or abnormal weight loss (+) fatigue Eyes: Denies blurriness of vision Ears, nose, mouth, throat, and face: Denies mucositis or sore throat Respiratory: Denies cough, dyspnea or wheezes Cardiovascular: Denies palpitation, chest discomfort Gastrointestinal: Denies nausea, heartburn or change in bowel habits Skin: Denies abnormal skin rashes Lymphatics: Denies new lymphadenopathy or easy bruising Neurological: Denies numbness, tingling or new weaknesses Behavioral/Psych: Mood is stable, no new changes  Extremities: No lower extremity edema All other systems were reviewed with the patient and are negative.  I have reviewed the past medical history, past surgical history, social history and family history with the patient and they are unchanged from previous note.  ALLERGIES:  is allergic to pollen extract.  MEDICATIONS:  Current Outpatient Medications  Medication Sig Dispense Refill  . aspirin 81 MG chewable tablet Chew by mouth.    [name] atorvastatin (LIPITOR) 40 MG tablet Take 1 tablet (40 mg total) by mouth daily. Needs appointment for future refills 90 tablet 0  . baclofen (LIORESAL) 10 MG tablet Take 1 tablet (10 mg total) by mouth 3 (three) times daily. Prn for IBS flare 30 each 0    . Blood Glucose Monitoring Suppl (ONETOUCH VERIO) w/Device KIT 1 Device by Does not apply route 2 (two) times daily as needed. 1 kit 0  . Erenumab-aooe (AIMOVIG) 140 MG/ML SOAJ Inject 140 mg into the skin every 30 (thirty) days.    [name] FARXIGA 5 MG TABS tablet TAKE 1 TABLET BY MOUTH DAILY 90 tablet 1  . gabapentin (NEURONTIN) 600 MG tablet Take 600 mg by mouth as needed.     [name] ibuprofen (ADVIL,MOTRIN) 600 MG tablet TAKE 1 TABLET(600 MG) BY MOUTH EVERY 8 HOURS AS NEEDED 30 tablet 0  . insulin degludec (TRESIBA FLEXTOUCH) 100 UNIT/ML SOPN FlexTouch Pen Inject 0.1 mLs (10 Units total) into the skin daily. And pen needles 1/day 5 pen 11  . losartan-hydrochlorothiazide (HYZAAR) 100-25 MG tablet Take 1 tablet by mouth daily. 90 tablet 1  . metFORMIN (GLUCOPHAGE) 1000 MG tablet Take 1 tablet (1,000 mg total) by mouth 2 (two) times daily with a meal. 180 tablet 1  . methocarbamol (ROBAXIN) 500 MG tablet TAKE 1 TABLET BY MOUTH TWICE DAILY (Patient taking differently: Take 500 mg by mouth every 6 (six) hours as needed. ) 20 tablet 0  . omeprazole (PRILOSEC) 40 MG capsule Take 1 capsule (40 mg total) by mouth daily as needed (indigestion). 90 capsule 1  . sildenafil (REVATIO) 20 MG tablet Take 20 mg by mouth. 3-5 tabs prn    . SUMAtriptan (IMITREX) 100 MG tablet Take 1 tablet (100 mg total) by mouth once as needed for up to 1 dose. May repeat in 2 hours if headache persists or recurs. 10 tablet 12  .  timolol (TIMOPTIC-XR) 0.5 % ophthalmic gel-forming Place 1 drop into both eyes daily.     . verapamil (CALAN-SR) 180 MG CR tablet Take 1 tablet (180 mg total) by mouth daily. 90 tablet 1   No current facility-administered medications for this visit.     PHYSICAL EXAMINATION: ECOG PERFORMANCE STATUS: 1 - Symptomatic but completely ambulatory  Vitals:   03/26/18 0905  BP: (!) 166/87  Pulse: 68  Resp: 18  Temp: 98.6 F (37 C)  SpO2: 100%   Filed Weights   03/26/18 0905  Weight: 206 lb 14.4 oz (93.8 kg)     GENERAL: alert, no distress and comfortable SKIN: skin color, texture, turgor are normal, no rashes or significant lesions EYES: normal, Conjunctiva are pink and non-injected, sclera clear OROPHARYNX: no exudate, no erythema and lips, buccal mucosa, and tongue normal  NECK: supple, thyroid normal size, non-tender, without nodularity LYMPH: no palpable lymphadenopathy in the cervical, axillary or inguinal LUNGS: clear to auscultation and percussion with normal breathing effort HEART: regular rate & rhythm and no murmurs and no lower extremity edema ABDOMEN: abdomen soft, non-tender and normal bowel sounds MUSCULOSKELETAL: no cyanosis of digits and no clubbing  NEURO: alert & oriented x 3 with fluent speech, no focal motor/sensory deficits EXTREMITIES: No lower extremity edema  LABORATORY DATA:  I have reviewed the data as listed CMP Latest Ref Rng & Units 09/03/2017 04/05/2017 12/07/2016  Glucose 70 - 99 mg/dL 135(H) 98 104(H)  BUN 6 - 23 mg/dL _0 Creatinine 0.40 - 1.50 mg/dL 1.17 1.05 1.09  Sodium 135 - 145 mEq/L 139 142 141  Potassium 3.5 - 5.1 mEq/L 3.6 3.7 3.7  Chloride 96 - 112 mEq/L 102 103 104  CO2 19 - 32 mEq/L 29 30 33(H)  Calcium 8.4 - 10.5 mg/dL 9.2 9.7 9.5  Total Protein 6.0 - 8.3 g/dL 8.0 - 8.0  Total Bilirubin 0.2 - 1.2 mg/dL 0.4 - 0.3  Alkaline Phos 39 - 117 U/L 49 - 63  AST 0 - 37 U/L 25 - 22  ALT 0 - 53 U/L 20 - 27    Lab Results  Component Value Date   WBC 4.3 03/24/2018   HGB 13.3 03/24/2018   HCT 43.2 03/24/2018   MCV 83.6 03/24/2018   PLT 250 03/24/2018   NEUTROABS 2.3 03/24/2018    ASSESSMENT & PLAN:  Iron deficiency anemia IV iron: September 2019 Lab review: 03/24/2018: Hemoglobin 13.3, MCV 83.6, RDW 15.9 Iron studies are pending  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  Based on these results there is no indication for IV iron therapy. Return to clinic in 6 months with labs and follow-up. If the labs remain stable  at that time then patient may not need to see [place] subsequently.    Orders Placed This Encounter  Procedures  . Ferritin    Standing Status:   Future    Standing Expiration Date:   03/26/2019  . Iron and TIBC    Standing Status:   Future    Standing Expiration Date:   03/26/2019  . CBC with Differential (Cancer Center Only)    Standing Status:   Future    Standing Expiration Date:   03/27/2019   The patient has a good understanding of the overall plan. he agrees with it. he will call with any problems that may develop before the next visit here.  Nicholas Lose, MD 03/26/2018  Julious Oka Dorshimer am acting as Education administrator for  Dr. Nicholas Lose.  I have reviewed the above documentation for accuracy and completeness, and I agree with the above.

## 2018-03-26 ENCOUNTER — Inpatient Hospital Stay (HOSPITAL_BASED_OUTPATIENT_CLINIC_OR_DEPARTMENT_OTHER): Payer: 59 | Admitting: Hematology and Oncology

## 2018-03-26 ENCOUNTER — Other Ambulatory Visit: Payer: Self-pay

## 2018-03-26 ENCOUNTER — Ambulatory Visit (INDEPENDENT_AMBULATORY_CARE_PROVIDER_SITE_OTHER): Payer: 59 | Admitting: Endocrinology

## 2018-03-26 ENCOUNTER — Inpatient Hospital Stay: Payer: 59

## 2018-03-26 ENCOUNTER — Telehealth: Payer: Self-pay | Admitting: Hematology and Oncology

## 2018-03-26 ENCOUNTER — Encounter: Payer: Self-pay | Admitting: Endocrinology

## 2018-03-26 VITALS — BP 142/70 | HR 76 | Ht 67.0 in | Wt 208.0 lb

## 2018-03-26 DIAGNOSIS — I10 Essential (primary) hypertension: Secondary | ICD-10-CM

## 2018-03-26 DIAGNOSIS — E119 Type 2 diabetes mellitus without complications: Secondary | ICD-10-CM | POA: Diagnosis not present

## 2018-03-26 DIAGNOSIS — R5383 Other fatigue: Secondary | ICD-10-CM | POA: Diagnosis not present

## 2018-03-26 DIAGNOSIS — D508 Other iron deficiency anemias: Secondary | ICD-10-CM

## 2018-03-26 DIAGNOSIS — D509 Iron deficiency anemia, unspecified: Secondary | ICD-10-CM | POA: Diagnosis not present

## 2018-03-26 LAB — FERRITIN: Ferritin: 33 ng/mL (ref 24–336)

## 2018-03-26 LAB — POCT GLYCOSYLATED HEMOGLOBIN (HGB A1C): Hemoglobin A1C: 6.2 % — AB (ref 4.0–5.6)

## 2018-03-26 LAB — IRON AND TIBC
Iron: 68 ug/dL (ref 42–163)
Saturation Ratios: 19 % — ABNORMAL LOW (ref 20–55)
TIBC: 353 ug/dL (ref 202–409)
UIBC: 284 ug/dL (ref 117–376)

## 2018-03-26 MED ORDER — INSULIN DEGLUDEC 100 UNIT/ML ~~LOC~~ SOPN: 5.0000 [IU] | PEN_INJECTOR | Freq: Every day | SUBCUTANEOUS | 11 refills | Status: DC

## 2018-03-26 NOTE — Patient Instructions (Signed)
I have sent a prescription to your pharmacy, to reduce the tresiba again. Please continue the same diabetes pills.  check your blood sugar twice a day.  vary the time of day when you check, between before the 3 meals, and at bedtime.  also check if you have symptoms of your blood sugar being too high or too low.  please keep a record of the readings and bring it to your next appointment here (or you can bring the meter itself).  You can write it on any piece of paper.  please call us sooner if your blood sugar goes below 70, or if you have a lot of readings over 200.  Please come back for a follow-up appointment in 2 months.

## 2018-03-26 NOTE — Assessment & Plan Note (Signed)
IV iron: September 2019 Lab review: 03/24/2018: Hemoglobin 13.3, MCV 83.6, RDW 15.9 Iron studies are pending  Based on these results there is no indication for IV iron therapy. Return to clinic in 6 months with labs and follow-up. If the labs remain stable at that time then patient may not need to see [place] subsequently.

## 2018-03-26 NOTE — Telephone Encounter (Signed)
Gave avs and calendar ° °

## 2018-03-26 NOTE — Progress Notes (Signed)
Subjective:    Patient ID: Jason Ortega, male    DOB: August 29, 1965, 53 y.o.   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   Interval history: no cbg record, but states cbg's are approx 100.  There is no trend throughout the day.  pt states he feels well in general.   Past Medical History:  Diagnosis Date  . Allergy   . Anemia   . Arthritis   . Cataract    bilateral cataracts removed  . Depression   . Diabetes mellitus   . Frequent headaches   . GERD (gastroesophageal reflux disease)   . Glaucoma   . Hypercholesteremia   . Hypertension   . Migraines   . Sleep apnea    wears c-PAP    Past Surgical History:  Procedure Laterality Date  . bilateral cataracts removed    . CARDIAC CATHETERIZATION  12/2011  . COLONOSCOPY    . LEFT HEART CATHETERIZATION WITH CORONARY ANGIOGRAM N/A 11/12/2011   Procedure: LEFT HEART CATHETERIZATION WITH CORONARY ANGIOGRAM;  Surgeon: Laverda Page, MD;  Location: [facility] CATH [facility];  Service: Cardiovascular;  Laterality: N/A;  . SHOULDER SURGERY      Social History   Socioeconomic History  . Marital status: Married    Spouse name: Not on file  . Number of children: 2  . Years of education: 15  . Highest education level: Not on file  Occupational History  . Occupation: Clinical research associate  Social Needs  . Financial resource strain: Not on file  . Food insecurity:    Worry: Not on file    Inability: Not on file  . Transportation needs:    Medical: Not on file    Non-medical: Not on file  Tobacco Use  . Smoking status: Never Smoker  . Smokeless tobacco: Never Used  Substance and Sexual Activity  . Alcohol use: Yes   Comment: occasional  . Drug use: No  . Sexual activity: Yes    Birth control/protection: None  Lifestyle  . Physical activity:    Days per week: Not on file    Minutes per session: Not on file  . Stress: Not on file  Relationships  . Social connections:    Talks on phone: Not on file    Gets together: Not on file    Attends religious service: Not on file    Active member of club or organization: Not on file    Attends meetings of clubs or organizations: Not on file    Relationship status: Not on file  . Intimate partner violence:    Fear of current or ex partner: Not on file    Emotionally abused: Not on file    Physically abused: Not on file    Forced sexual activity: Not on file  Other Topics Concern  . Not on file  Social History Narrative   Fun/Hobby: Coach football    Current Outpatient Medications on File Prior to Visit  Medication Sig Dispense Refill  . aspirin 81 MG chewable tablet Chew by mouth.    [name] atorvastatin (LIPITOR) 40 MG tablet Take 1 tablet (40 mg total) by mouth daily. Needs appointment for future refills 90 tablet 0  . baclofen (LIORESAL) 10  MG tablet Take 1 tablet (10 mg total) by mouth 3 (three) times daily. Prn for IBS flare 30 each 0  . Blood Glucose Monitoring Suppl (ONETOUCH VERIO) w/Device KIT 1 Device by Does not apply route 2 (two) times daily as needed. 1 kit 0  . Erenumab-aooe (AIMOVIG) 140 MG/ML SOAJ Inject 140 mg into the skin every 30 (thirty) days.    [name] FARXIGA 5 MG TABS tablet TAKE 1 TABLET BY MOUTH DAILY 90 tablet 1  . gabapentin (NEURONTIN) 600 MG tablet Take 600 mg by mouth as needed.     [name] losartan-hydrochlorothiazide (HYZAAR) 100-25 MG tablet Take 1 tablet by mouth daily. 90 tablet 1  . metFORMIN (GLUCOPHAGE) 1000 MG tablet Take 1 tablet (1,000 mg total) by mouth 2 (two) times daily with a meal. 180 tablet 1  . methocarbamol (ROBAXIN) 500 MG tablet TAKE 1 TABLET BY MOUTH TWICE DAILY (Patient taking differently: Take 500 mg by mouth every  6 (six) hours as needed. ) 20 tablet 0  . omeprazole (PRILOSEC) 40 MG capsule Take 1 capsule (40 mg total) by mouth daily as needed (indigestion). 90 capsule 1  . sildenafil (REVATIO) 20 MG tablet Take 20 mg by mouth. 3-5 tabs prn    . SUMAtriptan (IMITREX) 100 MG tablet Take 1 tablet (100 mg total) by mouth once as needed for up to 1 dose. May repeat in 2 hours if headache persists or recurs. 10 tablet 12  . timolol (TIMOPTIC-XR) 0.5 % ophthalmic gel-forming Place 1 drop into both eyes daily.     . verapamil (CALAN-SR) 180 MG CR tablet Take 1 tablet (180 mg total) by mouth daily. 90 tablet 1   No current facility-administered medications on file prior to visit.     Allergies  Allergen Reactions  . Pollen Extract Other (See Comments)    Stuffy nose    Family History  Problem Relation Age of Onset  . Arthritis Mother   . Stroke Mother   . Hypertension Father   . Diabetes Father   . Cancer Father        Multiple Myleoma  . Diabetes Maternal Grandfather   . Colon cancer Neg Hx   . Esophageal cancer Neg Hx   . Rectal cancer Neg Hx   . Stomach cancer Neg Hx     BP (!) 142/70 (BP Location: Left Arm, Patient Position: Sitting, Cuff Size: Large)   Pulse 76   Ht '5\' 7"'$  (1.702 m)   Wt 208 lb (94.3 kg)   SpO2 95%   BMI 32.58 kg/m    Review of Systems He denies hypoglycemia    Objective:   Physical Exam VITAL SIGNS:  See vs page.  GENERAL: no distress.  Pulses: dorsalis pedis intact bilat.   MSK: no deformity of the feet.  CV: no leg edema.  Skin:  no ulcer on the feet, but the skin is dry.  normal color and temp on the feet. Neuro: sensation is intact to touch on the feet, but decreased from normal.  Ext: There is bilateral onychomycosis of the toenails.    Lab Results  Component Value Date   HGBA1C 6.2 (A) 03/26/2018   Lab Results  Component Value Date   CREATININE 1.17 09/03/2017   BUN 14 09/03/2017   NA 139 09/03/2017   K 3.6 09/03/2017   CL 102 09/03/2017    CO2 29 09/03/2017        Assessment & Plan:  Insulin-requiring type 2 DM, with PN: overcontrolled.  We discussed changing insulin to increased oral rx, but he prefers just decreasing insulin.    Patient Instructions  I have sent a prescription to your pharmacy, to reduce the tresiba again. Please continue the same diabetes pills.  check your blood sugar twice a day.  vary the time of day when you check, between before the 3 meals, and at bedtime.  also check if you have symptoms of your blood sugar being too high or too low.  please keep a record of the readings and bring it to your next appointment here (or you can bring the meter itself).  You can write it on any piece of paper.  please call us sooner if your blood sugar goes below 70, or if you have a lot of readings over 200.  Please come back for a follow-up appointment in 2 months.

## 2018-03-27 ENCOUNTER — Other Ambulatory Visit: Payer: Self-pay | Admitting: Nurse Practitioner

## 2018-04-08 ENCOUNTER — Other Ambulatory Visit: Payer: Self-pay | Admitting: Neurology

## 2018-04-08 ENCOUNTER — Other Ambulatory Visit: Payer: Self-pay | Admitting: Nurse Practitioner
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
## 2018-04-17 ENCOUNTER — Telehealth: Payer: Self-pay | Admitting: *Deleted

## 2018-04-17 ENCOUNTER — Encounter: Payer: Self-pay | Admitting: Family Medicine

## 2018-04-17 ENCOUNTER — Ambulatory Visit (INDEPENDENT_AMBULATORY_CARE_PROVIDER_SITE_OTHER): Payer: 59 | Admitting: Family Medicine

## 2018-04-17 ENCOUNTER — Other Ambulatory Visit: Payer: Self-pay

## 2018-04-17 VITALS — BP 154/92 | HR 63 | Ht 67.0 in | Wt 212.0 lb

## 2018-04-17 DIAGNOSIS — G43709 Chronic migraine without aura, not intractable, without status migrainosus: Secondary | ICD-10-CM

## 2018-04-18 NOTE — Telephone Encounter (Signed)
FMLA forms completed, signed & sent to medical records for processing.  

## 2018-04-23 NOTE — Progress Notes (Signed)
Made any corrections needed, and agree with history, physical, neuro exam,assessment and plan as stated.     Deion Forgue, MD [facility]  

## 2018-04-28 ENCOUNTER — Ambulatory Visit: Payer: 59 | Admitting: Endocrinology

## 2018-04-28 ENCOUNTER — Other Ambulatory Visit: Payer: Self-pay | Admitting: *Deleted

## 2018-04-28 DIAGNOSIS — E119 Type 2 diabetes mellitus without complications: Secondary | ICD-10-CM

## 2018-04-28 MED ORDER — METFORMIN HCL 1000 MG PO TABS: 1000.0000 mg | ORAL_TABLET | Freq: Two times a day (BID) | ORAL | 0 refills | Status: DC

## 2018-04-28 MED ORDER — DAPAGLIFLOZIN PROPANEDIOL 5 MG PO TABS: 5.0000 mg | ORAL_TABLET | Freq: Every day | ORAL | 0 refills | Status: DC

## 2018-05-14 ENCOUNTER — Telehealth: Payer: Self-pay | Admitting: Nurse Practitioner

## 2018-05-14 ENCOUNTER — Other Ambulatory Visit: Payer: Self-pay | Admitting: *Deleted

## 2018-05-14 MED ORDER — IBUPROFEN 600 MG PO TABS: ORAL_TABLET | ORAL | 0 refills | Status: DC

## 2018-05-14 NOTE — Telephone Encounter (Signed)
Patient called requesting dicyclomine for his irritable bowel.  Please advise.

## 2018-05-15 MED ORDER — DICYCLOMINE HCL 10 MG PO CAPS: 10.0000 mg | ORAL_CAPSULE | Freq: Three times a day (TID) | ORAL | 3 refills | Status: DC

## 2018-05-15 NOTE — Telephone Encounter (Signed)
Pt informed of below.  

## 2018-05-15 NOTE — Telephone Encounter (Signed)
I called pt- he states this was previously rx'd at the [facility] center. He states his IBS causes diarrhea. He denies abdominal pain, bloody stools. Please advise. See 12/24/17 OV.

## 2018-05-15 NOTE — Telephone Encounter (Signed)
Ok for refill - done erx but should seek attention for any worsening pain, fever, bloody diarrhea or other unsusual symptoms

## 2018-05-23 ENCOUNTER — Encounter: Payer: Self-pay | Admitting: Endocrinology

## 2018-05-26 ENCOUNTER — Ambulatory Visit (INDEPENDENT_AMBULATORY_CARE_PROVIDER_SITE_OTHER): Payer: 59 | Admitting: Endocrinology

## 2018-05-26 ENCOUNTER — Other Ambulatory Visit: Payer: Self-pay | Admitting: Internal Medicine

## 2018-05-26 ENCOUNTER — Other Ambulatory Visit: Payer: Self-pay

## 2018-05-26 DIAGNOSIS — E119 Type 2 diabetes mellitus without complications: Secondary | ICD-10-CM | POA: Diagnosis not present

## 2018-05-26 DIAGNOSIS — R609 Edema, unspecified: Secondary | ICD-10-CM | POA: Diagnosis not present

## 2018-05-26 NOTE — Progress Notes (Signed)
Subjective:    Patient ID: Jason Ortega, male    DOB: [birth year], 53 y.o.   MRN: [id number]  HPI  Pt is here for telehealth visit, via doxy video visit.  Alternatives to telehealth are presented to this patient, and the patient agrees.   Patient is at work, and I am at the office.   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   Interval history: no cbg record, but states cbg's are in the low-100's.  There is no trend throughout the day.  pt states he feels well in general.   Past Medical History:  Diagnosis Date  . Allergy   . Anemia   . Arthritis   . Cataract    bilateral cataracts removed  . Depression   . Diabetes mellitus   . Frequent headaches   . GERD (gastroesophageal reflux disease)   . Glaucoma   . Hypercholesteremia   . Hypertension   . Migraines   . Sleep apnea    wears c-PAP    Past Surgical History:  Procedure Laterality Date  . bilateral cataracts removed    . CARDIAC CATHETERIZATION  12/2011  . COLONOSCOPY    . LEFT HEART CATHETERIZATION WITH CORONARY ANGIOGRAM N/A 11/12/2011   Procedure: LEFT HEART CATHETERIZATION WITH CORONARY ANGIOGRAM;  Surgeon: Laverda Page, MD;  Location: [facility] CATH LAB;  Service: Cardiovascular;  Laterality: N/A;  . SHOULDER SURGERY      Social History   Socioeconomic History  . Marital status: Married    Spouse name: Not on file  . Number of children: 2  . Years of education: 73  . Highest education level: Not on file  Occupational History  . Occupation: Clinical research associate  Social Needs  . Financial resource strain: Not on file  . Food insecurity:    Worry: Not on file    Inability: Not on file  . Transportation needs:     Medical: Not on file    Non-medical: Not on file  Tobacco Use  . Smoking status: Never Smoker  . Smokeless tobacco: Never Used  Substance and Sexual Activity  . Alcohol use: Yes    Comment: occasional  . Drug use: No  . Sexual activity: Yes    Birth control/protection: None  Lifestyle  . Physical activity:    Days per week: Not on file    Minutes per session: Not on file  . Stress: Not on file  Relationships  . Social connections:    Talks on phone: Not on file    Gets together: Not on file    Attends religious service: Not on file    Active member of club or organization: Not on file    Attends meetings of clubs or organizations: Not on file    Relationship status: Not on file  . Intimate partner violence:    Fear of current or ex partner: Not on file    Emotionally abused: Not on file    Physically abused: Not on file    Forced sexual activity: Not on file  Other Topics Concern  . Not on file  Social History Narrative   Fun/Hobby: Coach football    Current Outpatient Medications on File Prior to Visit  Medication Sig Dispense Refill  .  aspirin 81 MG chewable tablet Chew by mouth.    [name] atorvastatin (LIPITOR) 40 MG tablet Take 1 tablet (40 mg total) by mouth daily. Needs appointment for future refills 90 tablet 0  . baclofen (LIORESAL) 10 MG tablet Take 1 tablet (10 mg total) by mouth 3 (three) times daily. Prn for IBS flare 30 each 0  . Blood Glucose Monitoring Suppl (ONETOUCH VERIO) w/Device KIT 1 Device by Does not apply route 2 (two) times daily as needed. 1 kit 0  . dapagliflozin propanediol (FARXIGA) 5 MG TABS tablet Take 5 mg by mouth daily. 90 tablet 0  . Erenumab-aooe (AIMOVIG) 140 MG/ML SOAJ Inject 140 mg into the skin every 30 (thirty) days.    [name] gabapentin (NEURONTIN) 600 MG tablet Take 600 mg by mouth as needed.     [name] ibuprofen (ADVIL,MOTRIN) 600 MG tablet TAKE 1 TABLET(600 MG) BY MOUTH EVERY 8 HOURS AS NEEDED 30 tablet 0  . losartan-hydrochlorothiazide  (HYZAAR) 100-25 MG tablet Take 1 tablet by mouth daily. 90 tablet 1  . metFORMIN (GLUCOPHAGE) 1000 MG tablet Take 1 tablet (1,000 mg total) by mouth 2 (two) times daily with a meal. 180 tablet 0  . methocarbamol (ROBAXIN) 500 MG tablet TAKE 1 TABLET BY MOUTH TWICE DAILY (Patient taking differently: Take 500 mg by mouth every 6 (six) hours as needed. ) 20 tablet 0  . omeprazole (PRILOSEC) 40 MG capsule Take 1 capsule (40 mg total) by mouth daily as needed (indigestion). 90 capsule 1  . sildenafil (REVATIO) 20 MG tablet Take 20 mg by mouth. 3-5 tabs prn    . SUMAtriptan (IMITREX) 100 MG tablet Take 1 tablet (100 mg total) by mouth once as needed for up to 1 dose. May repeat in 2 hours if headache persists or recurs. 10 tablet 12  . timolol (TIMOPTIC-XR) 0.5 % ophthalmic gel-forming Place 1 drop into both eyes daily.     . verapamil (CALAN-SR) 180 MG CR tablet Take 1 tablet (180 mg total) by mouth daily. 90 tablet 1   No current facility-administered medications on file prior to visit.     Allergies  Allergen Reactions  . Pollen Extract Other (See Comments)    Stuffy nose    Family History  Problem Relation Age of Onset  . Arthritis Mother   . Stroke Mother   . Hypertension Father   . Diabetes Father   . Cancer Father        Multiple Myleoma  . Diabetes Maternal Grandfather   . Colon cancer Neg Hx   . Esophageal cancer Neg Hx   . Rectal cancer Neg Hx   . Stomach cancer Neg Hx      Review of Systems He denies hypoglycemia.      Objective:   Physical Exam      Assessment & Plan:  Type 2 DM: well-controlled: he is ready for a trial off insulin. Edema: This limits rx options   Patient Instructions  Please stop taking the tresiba.   Please continue the same diabetes pills.   check your blood sugar twice a day.  vary the time of day when you check, between before the 3 meals, and at bedtime.  also check if you have symptoms of your blood sugar being too high or too low.   please keep a record of the readings and bring it to your next appointment here (or you can bring the meter itself).  You can write it on any piece of paper.  please  call us sooner if your blood sugar goes below 70, or if you have a lot of readings over 200.  Please come back for a follow-up appointment in 2 months.

## 2018-05-26 NOTE — Patient Instructions (Addendum)
Please stop taking the tresiba.   Please continue the same diabetes pills.   check your blood sugar twice a day.  vary the time of day when you check, between before the 3 meals, and at bedtime.  also check if you have symptoms of your blood sugar being too high or too low.  please keep a record of the readings and bring it to your next appointment here (or you can bring the meter itself).  You can write it on any piece of paper.  please call us sooner if your blood sugar goes below 70, or if you have a lot of readings over 200.  Please come back for a follow-up appointment in 2 months.

## 2018-05-28 ENCOUNTER — Encounter: Payer: Self-pay | Admitting: Endocrinology

## 2018-05-28 ENCOUNTER — Telehealth: Payer: Self-pay | Admitting: Endocrinology

## 2018-05-28 ENCOUNTER — Other Ambulatory Visit: Payer: Self-pay | Admitting: *Deleted

## 2018-05-28 MED ORDER — ERENUMAB-AOOE 140 MG/ML ~~LOC~~ SOAJ: 140.0000 mg | SUBCUTANEOUS | 10 refills | Status: DC

## 2018-05-28 NOTE — Telephone Encounter (Signed)
Patient called and would like a call back regarding uploading his blood sugar readings.    His number for call back is [phone]

## 2018-05-28 NOTE — Telephone Encounter (Signed)
Spoke to pt and gave him Linda's e-mail to e-mail readings for Dr. Everardo All

## 2018-06-12 ENCOUNTER — Telehealth: Payer: Self-pay | Admitting: Nurse Practitioner

## 2018-06-12 NOTE — Telephone Encounter (Signed)
Copied from CRM [id number]. Topic: Quick Communication - Rx Refill/Question >> Jun 12, 2018  4:55 PM Richarda Blade wrote: Medication: atorvastatin (LIPITOR) 40 MG tablet  omeprazole (PRILOSEC) 40 MG capsule    Has the patient contacted their pharmacy? No. (Agent: If no, request that the patient contact the pharmacy for the refill.)   Preferred Pharmacy (with phone number or street name): [id number] [place], [place] - [address] [phone] (Phone) [phone] (Fax)    Agent: Please be advised that RX refills may take up to 3 business days. We ask that you follow-up with your pharmacy.

## 2018-06-13 MED ORDER — ATORVASTATIN CALCIUM 40 MG PO TABS: 40.0000 mg | ORAL_TABLET | Freq: Every day | ORAL | 0 refills | Status: DC

## 2018-06-13 NOTE — Telephone Encounter (Signed)
Refill done  Please ask pt to make virtual visit or inperson visit for further refills

## 2018-06-13 NOTE — Telephone Encounter (Signed)
Left patient vm to call back to schedule TOC with Dr. Jonny Ruiz.

## 2018-06-25 MED ORDER — OMEPRAZOLE 40 MG PO CPDR: 40.0000 mg | DELAYED_RELEASE_CAPSULE | Freq: Every day | ORAL | 1 refills | Status: DC | PRN

## 2018-06-25 NOTE — Telephone Encounter (Signed)
Patient would like to know could he still get the omeprazole (PRILOSEC) 40 MG capsule refilled. He has a TOC scheduled with Dr Jonny Ruiz

## 2018-06-25 NOTE — Addendum Note (Signed)
Addended by: Roney Mans on: 06/25/2018 03:16 PM   Modules accepted: Orders

## 2018-06-25 NOTE — Telephone Encounter (Signed)
Done

## 2018-07-09 ENCOUNTER — Ambulatory Visit: Payer: 59 | Admitting: Neurology

## 2018-07-14 ENCOUNTER — Other Ambulatory Visit: Payer: Self-pay | Admitting: Neurology

## 2018-07-23 ENCOUNTER — Other Ambulatory Visit: Payer: Self-pay | Admitting: *Deleted

## 2018-07-23 MED ORDER — METFORMIN HCL 1000 MG PO TABS: 1000.0000 mg | ORAL_TABLET | Freq: Two times a day (BID) | ORAL | 0 refills | Status: DC

## 2018-08-06 ENCOUNTER — Ambulatory Visit: Payer: 59 | Admitting: Endocrinology

## 2018-08-11 ENCOUNTER — Other Ambulatory Visit: Payer: Self-pay | Admitting: *Deleted

## 2018-08-11 DIAGNOSIS — E119 Type 2 diabetes mellitus without complications: Secondary | ICD-10-CM

## 2018-08-11 MED ORDER — FARXIGA 5 MG PO TABS: 5.0000 mg | ORAL_TABLET | Freq: Every day | ORAL | 0 refills | Status: DC

## 2018-08-13 ENCOUNTER — Other Ambulatory Visit: Payer: Self-pay

## 2018-08-15 ENCOUNTER — Ambulatory Visit (INDEPENDENT_AMBULATORY_CARE_PROVIDER_SITE_OTHER): Payer: 59 | Admitting: Endocrinology

## 2018-08-15 ENCOUNTER — Encounter: Payer: Self-pay | Admitting: Endocrinology

## 2018-08-15 ENCOUNTER — Other Ambulatory Visit: Payer: Self-pay

## 2018-08-15 VITALS — BP 170/88 | HR 76 | Ht 67.0 in | Wt 210.2 lb

## 2018-08-15 DIAGNOSIS — R609 Edema, unspecified: Secondary | ICD-10-CM | POA: Diagnosis not present

## 2018-08-15 DIAGNOSIS — Z794 Long term (current) use of insulin: Secondary | ICD-10-CM | POA: Diagnosis not present

## 2018-08-15 DIAGNOSIS — E1142 Type 2 diabetes mellitus with diabetic polyneuropathy: Secondary | ICD-10-CM

## 2018-08-15 DIAGNOSIS — E119 Type 2 diabetes mellitus without complications: Secondary | ICD-10-CM | POA: Diagnosis not present

## 2018-08-15 LAB — POCT GLYCOSYLATED HEMOGLOBIN (HGB A1C): Hemoglobin A1C: 6.7 % — AB (ref 4.0–5.6)

## 2018-08-15 MED ORDER — METFORMIN HCL 1000 MG PO TABS: 1000.0000 mg | ORAL_TABLET | Freq: Two times a day (BID) | ORAL | 3 refills | Status: DC

## 2018-08-15 MED ORDER — FARXIGA 10 MG PO TABS: 10.0000 mg | ORAL_TABLET | Freq: Every day | ORAL | 3 refills | Status: DC

## 2018-08-15 NOTE — Progress Notes (Signed)
Subjective:    Patient ID: Jason Ortega, male    DOB: [birth year], 53 y.o.   MRN: [id number]  HPI Pt returns for f/u of diabetes mellitus: DM type: Insulin-requiring type 2.  Dx'ed: 9357 Complications: polyneuropathy Therapy: 2 oral meds DKA: never Severe hypoglycemia: never Pancreatitis: never.  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 Interval history: no cbg record, but states cbg's are in the mid-100's.  There is no trend throughout the day.  pt states he feels well in general.   Past Medical History:  Diagnosis Date  . Allergy   . Anemia   . Arthritis   . Cataract    bilateral cataracts removed  . Depression   . Diabetes mellitus   . Frequent headaches   . GERD (gastroesophageal reflux disease)   . Glaucoma   . Hypercholesteremia   . Hypertension   . Migraines   . Sleep apnea    wears c-PAP    Past Surgical History:  Procedure Laterality Date  . bilateral cataracts removed    . CARDIAC CATHETERIZATION  12/2011  . COLONOSCOPY    . LEFT HEART CATHETERIZATION WITH CORONARY ANGIOGRAM N/A 11/12/2011   Procedure: LEFT HEART CATHETERIZATION WITH CORONARY ANGIOGRAM;  Surgeon: Laverda Page, MD;  Location: [facility] CATH LAB;  Service: Cardiovascular;  Laterality: N/A;  . SHOULDER SURGERY      Social History   Socioeconomic History  . Marital status: Married    Spouse name: Not on file  . Number of children: 2  . Years of education: 88  . Highest education level: Not on file  Occupational History  . Occupation: Clinical research associate  Social Needs  . Financial resource strain: Not on file  . Food insecurity    Worry: Not on file    Inability: Not on file  . Transportation needs    Medical: Not on file    Non-medical: Not on file  Tobacco Use  . Smoking status: Never Smoker  . Smokeless tobacco: Never Used  Substance and Sexual Activity  . Alcohol use: Yes    Comment: occasional  . Drug use:  No  . Sexual activity: Yes    Birth control/protection: None  Lifestyle  . Physical activity    Days per week: Not on file    Minutes per session: Not on file  . Stress: Not on file  Relationships  . Social Herbalist on phone: Not on file    Gets together: Not on file    Attends religious service: Not on file    Active member of club or organization: Not on file    Attends meetings of clubs or organizations: Not on file    Relationship status: Not on file  . Intimate partner violence    Fear of current or ex partner: Not on file    Emotionally abused: Not on file    Physically abused: Not on file    Forced sexual activity: Not on file  Other Topics Concern  . Not on file  Social History Narrative   Fun/Hobby: Coach football    Current Outpatient Medications on File Prior to Visit  Medication Sig Dispense Refill  . aspirin 81 MG chewable tablet Chew by mouth.    [name] atorvastatin (LIPITOR) 40 MG tablet Take 1 tablet (40 mg total) by mouth daily. Needs appointment for future refills 90 tablet 0  . baclofen (LIORESAL) 10 MG tablet  Take 1 tablet (10 mg total) by mouth 3 (three) times daily. Prn for IBS flare 30 each 0  . Blood Glucose Monitoring Suppl (ONETOUCH VERIO) w/Device KIT 1 Device by Does not apply route 2 (two) times daily as needed. 1 kit 0  . dicyclomine (BENTYL) 10 MG capsule TAKE 1 CAPSULE (10 MG TOTAL) BY MOUTH 4 (FOUR) TIMES DAILY - BEFORE MEALS AND AT BEDTIME. 90 capsule 3  . Erenumab-aooe (AIMOVIG) 140 MG/ML SOAJ Inject 140 mg into the skin every 30 (thirty) days. 1 pen 10  . gabapentin (NEURONTIN) 600 MG tablet Take 600 mg by mouth as needed.     [name] ibuprofen (ADVIL,MOTRIN) 600 MG tablet TAKE 1 TABLET(600 MG) BY MOUTH EVERY 8 HOURS AS NEEDED 30 tablet 0  . losartan-hydrochlorothiazide (HYZAAR) 100-25 MG tablet Take 1 tablet by mouth daily. 90 tablet 1  . methocarbamol (ROBAXIN) 500 MG tablet TAKE 1 TABLET BY MOUTH TWICE DAILY (Patient taking differently:  Take 500 mg by mouth every 6 (six) hours as needed. ) 20 tablet 0  . omeprazole (PRILOSEC) 40 MG capsule Take 1 capsule (40 mg total) by mouth daily as needed (indigestion). 90 capsule 1  . sildenafil (REVATIO) 20 MG tablet Take 20 mg by mouth. 3-5 tabs prn    . SUMAtriptan (IMITREX) 100 MG tablet TAKE 1 TABLET(100 MG) BY MOUTH 1 TIME FOR UP TO 1 DOSE AS NEEDED. MAY REPEAT IN 2 HOURS IF HEADACHE PERSISTS OR RECURS 10 tablet 9  . timolol (TIMOPTIC-XR) 0.5 % ophthalmic gel-forming Place 1 drop into both eyes daily.     . verapamil (CALAN-SR) 180 MG CR tablet Take 1 tablet (180 mg total) by mouth daily. 90 tablet 1   No current facility-administered medications on file prior to visit.     Allergies  Allergen Reactions  . Pollen Extract Other (See Comments)    Stuffy nose    Family History  Problem Relation Age of Onset  . Arthritis Mother   . Stroke Mother   . Hypertension Father   . Diabetes Father   . Cancer Father        Multiple Myleoma  . Diabetes Maternal Grandfather   . Colon cancer Neg Hx   . Esophageal cancer Neg Hx   . Rectal cancer Neg Hx   . Stomach cancer Neg Hx     BP (!) 170/88 (BP Location: Left Arm, Patient Position: Sitting, Cuff Size: Normal)   Pulse 76   Ht '5\' 7"'$  (1.702 m)   Wt 210 lb 3.2 oz (95.3 kg)   SpO2 98%   BMI 32.92 kg/m   Review of Systems He denies hypoglycemia.     Objective:   Physical Exam VITAL SIGNS:  See vs page.  GENERAL: no distress.  Pulses: dorsalis pedis intact bilat.   MSK: no deformity of the feet CV: trace bilat leg edema Skin:  no ulcer on the feet.  normal color and temp on the feet.  Neuro: sensation is intact to touch on the feet, but decreased from normal.   Lab Results  Component Value Date   CREATININE 1.17 09/03/2017   BUN 14 09/03/2017   NA 139 09/03/2017   K 3.6 09/03/2017   CL 102 09/03/2017   CO2 29 09/03/2017   A1c=6.7%    Assessment & Plan:  HTN: is noted today Insulin-requiring type 2 DM, with PN:  he needs increased rx Edema: This limits rx options.   Patient Instructions  Your blood pressure is high today.  Please see your new primary care provider soon, to have it rechecked. I have sent a prescription to your pharmacy, to increase the farxiga.  Please continue the same metformin.  check your blood sugar twice a day.  vary the time of day when you check, between before the 3 meals, and at bedtime.  also check if you have symptoms of your blood sugar being too high or too low.  please keep a record of the readings and bring it to your next appointment here (or you can bring the meter itself).  You can write it on any piece of paper.  please call us sooner if your blood sugar goes below 70, or if you have a lot of readings over 200.  Please come back for a follow-up appointment in 3 months.

## 2018-08-15 NOTE — Patient Instructions (Addendum)
Your blood pressure is high today.  Please see your new primary care provider soon, to have it rechecked. I have sent a prescription to your pharmacy, to increase the farxiga.  Please continue the same metformin.  check your blood sugar twice a day.  vary the time of day when you check, between before the 3 meals, and at bedtime.  also check if you have symptoms of your blood sugar being too high or too low.  please keep a record of the readings and bring it to your next appointment here (or you can bring the meter itself).  You can write it on any piece of paper.  please call us sooner if your blood sugar goes below 70, or if you have a lot of readings over 200.  Please come back for a follow-up appointment in 3 months.

## 2018-08-22 ENCOUNTER — Other Ambulatory Visit: Payer: Self-pay

## 2018-08-22 ENCOUNTER — Other Ambulatory Visit (INDEPENDENT_AMBULATORY_CARE_PROVIDER_SITE_OTHER): Payer: 59

## 2018-08-22 ENCOUNTER — Ambulatory Visit (INDEPENDENT_AMBULATORY_CARE_PROVIDER_SITE_OTHER): Payer: 59 | Admitting: Internal Medicine

## 2018-08-22 ENCOUNTER — Encounter: Payer: Self-pay | Admitting: Internal Medicine

## 2018-08-22 VITALS — BP 168/90 | HR 67 | Temp 98.1°F | Ht 67.0 in | Wt 213.0 lb

## 2018-08-22 DIAGNOSIS — Z114 Encounter for screening for human immunodeficiency virus [HIV]: Secondary | ICD-10-CM

## 2018-08-22 DIAGNOSIS — Z0001 Encounter for general adult medical examination with abnormal findings: Secondary | ICD-10-CM

## 2018-08-22 DIAGNOSIS — Z23 Encounter for immunization: Secondary | ICD-10-CM

## 2018-08-22 DIAGNOSIS — E611 Iron deficiency: Secondary | ICD-10-CM | POA: Diagnosis not present

## 2018-08-22 DIAGNOSIS — E538 Deficiency of other specified B group vitamins: Secondary | ICD-10-CM | POA: Diagnosis not present

## 2018-08-22 DIAGNOSIS — M25561 Pain in right knee: Secondary | ICD-10-CM

## 2018-08-22 DIAGNOSIS — I10 Essential (primary) hypertension: Secondary | ICD-10-CM

## 2018-08-22 DIAGNOSIS — D508 Other iron deficiency anemias: Secondary | ICD-10-CM

## 2018-08-22 DIAGNOSIS — F419 Anxiety disorder, unspecified: Secondary | ICD-10-CM

## 2018-08-22 DIAGNOSIS — F329 Major depressive disorder, single episode, unspecified: Secondary | ICD-10-CM

## 2018-08-22 DIAGNOSIS — E119 Type 2 diabetes mellitus without complications: Secondary | ICD-10-CM

## 2018-08-22 DIAGNOSIS — E559 Vitamin D deficiency, unspecified: Secondary | ICD-10-CM

## 2018-08-22 DIAGNOSIS — Z1159 Encounter for screening for other viral diseases: Secondary | ICD-10-CM

## 2018-08-22 LAB — BASIC METABOLIC PANEL
BUN: 15 mg/dL (ref 6–23)
CO2: 29 mEq/L (ref 19–32)
Calcium: 9.2 mg/dL (ref 8.4–10.5)
Chloride: 104 mEq/L (ref 96–112)
Creatinine, Ser: 0.97 mg/dL (ref 0.40–1.50)
GFR: 97.28 mL/min (ref >60.00)
Glucose, Bld: 126 mg/dL — ABNORMAL HIGH (ref 70–99)
Potassium: 4 mEq/L (ref 3.5–5.1)
Sodium: 141 mEq/L (ref 135–145)

## 2018-08-22 LAB — HEPATIC FUNCTION PANEL
ALT: 23 U/L (ref 0–53)
AST: 20 U/L (ref 0–37)
Albumin: 4.3 g/dL (ref 3.5–5.2)
Alkaline Phosphatase: 61 U/L (ref 39–117)
Bilirubin, Direct: 0.1 mg/dL (ref 0.0–0.3)
Total Bilirubin: 0.3 mg/dL (ref 0.2–1.2)
Total Protein: 7.2 g/dL (ref 6.0–8.3)

## 2018-08-22 LAB — CBC WITH DIFFERENTIAL/PLATELET
Basophils Absolute: 0 10*3/uL (ref 0.0–0.1)
Basophils Relative: 0.5 % (ref 0.0–3.0)
Eosinophils Absolute: 0.1 10*3/uL (ref 0.0–0.7)
Eosinophils Relative: 3.2 % (ref 0.0–5.0)
HCT: 44 % (ref 39.0–52.0)
Hemoglobin: 13.8 g/dL (ref 13.0–17.0)
Lymphocytes Relative: 37.3 % (ref 12.0–46.0)
Lymphs Abs: 1.7 10*3/uL (ref 0.7–4.0)
MCHC: 31.3 g/dL (ref 30.0–36.0)
MCV: 84.4 fl (ref 78.0–100.0)
Monocytes Absolute: 0.5 10*3/uL (ref 0.1–1.0)
Monocytes Relative: 10.3 % (ref 3.0–12.0)
Neutro Abs: 2.3 10*3/uL (ref 1.4–7.7)
Neutrophils Relative %: 48.7 % (ref 43.0–77.0)
Platelets: 255 10*3/uL (ref 150.0–400.0)
RBC: 5.21 Mil/uL (ref 4.22–5.81)
RDW: 14.6 % (ref 11.5–15.5)
WBC: 4.6 10*3/uL (ref 4.0–10.5)

## 2018-08-22 LAB — FERRITIN: Ferritin: 25.8 ng/mL (ref 22.0–322.0)

## 2018-08-22 LAB — LIPID PANEL
Cholesterol: 147 mg/dL (ref 0–200)
HDL: 54.5 mg/dL (ref >39.00)
LDL Cholesterol: 75 mg/dL (ref 0–99)
NonHDL: 92.65
Total CHOL/HDL Ratio: 3
Triglycerides: 87 mg/dL (ref 0.0–149.0)
VLDL: 17.4 mg/dL (ref 0.0–40.0)

## 2018-08-22 LAB — IBC PANEL
Iron: 65 ug/dL (ref 42–165)
Saturation Ratios: 16.1 % — ABNORMAL LOW (ref 20.0–50.0)
Transferrin: 289 mg/dL (ref 212.0–360.0)

## 2018-08-22 LAB — VITAMIN D 25 HYDROXY (VIT D DEFICIENCY, FRACTURES): VITD: 22.67 ng/mL — ABNORMAL LOW (ref 30.00–100.00)

## 2018-08-22 LAB — PSA: PSA: 0.59 ng/mL (ref 0.10–4.00)

## 2018-08-22 LAB — VITAMIN B12: Vitamin B-12: 374 pg/mL (ref 211–911)

## 2018-08-22 LAB — TSH: TSH: 0.98 u[IU]/mL (ref 0.35–4.50)

## 2018-08-22 MED ORDER — DICLOFENAC SODIUM 1 % TD GEL: 2.0000 g | Freq: Four times a day (QID) | TRANSDERMAL | 5 refills | Status: DC

## 2018-08-22 MED ORDER — LOSARTAN POTASSIUM-HCTZ 100-25 MG PO TABS: 1.0000 | ORAL_TABLET | Freq: Every day | ORAL | 3 refills | Status: DC

## 2018-08-22 MED ORDER — DICLOFENAC SODIUM 75 MG PO TBEC: 75.0000 mg | DELAYED_RELEASE_TABLET | Freq: Two times a day (BID) | ORAL | 5 refills | Status: DC

## 2018-08-22 NOTE — Progress Notes (Signed)
Subjective:    Patient ID: Jason Ortega, male    DOB: [birth year], 53 y.o.   MRN: [id number]  HPI Here for wellness and f/u;  Overall doing ok;  Pt denies Chest pain, worsening SOB, DOE, wheezing, orthopnea, PND, worsening LE edema, palpitations, dizziness or syncope.  Pt denies neurological change such as new headache, facial or extremity weakness.  Pt denies polydipsia, polyuria, or low sugar symptoms. Pt states overall good compliance with treatment and medications, good tolerability, and has been trying to follow appropriate diet. . No fever, night sweats, wt loss, loss of appetite, or other constitutional symptoms.  Pt states good ability with ADL's, has low fall risk, home safety reviewed and adequate, no other significant changes in hearing or vision, and only occasionally active with exercise. Pt missed eye doctor appt early this wk, plans to reschedule.  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  Today has been worse x 2 days, anterior knee, no radation except to the upper leg sometimes, no swelling, no giveaways recently but has threatened on occasaional, still managable, has falles twice this yr with slip and fall on leaves (accident only).  Did have a plain film at  [facility] orthopedic, pt reports told film was "everythings fine" except wear and tear with sports.  Has only taken ibuprofen prn not really working.   Denies worsening reflux, abd pain, dysphagia, n/v, bowel change or blood. No overt bleeding Denies worsening depressive symptoms, suicidal ideation, or panic; has ongoing anxiety, not increased recently.  Past Medical History:  Diagnosis Date  . Allergy   . Anemia   . Arthritis   . Cataract    bilateral cataracts removed  . Depression   . Diabetes mellitus   . Frequent headaches   . GERD  (gastroesophageal reflux disease)   . Glaucoma   . Hypercholesteremia   . Hypertension   . Migraines   . Sleep apnea    wears c-PAP   Past Surgical History:  Procedure Laterality Date  . bilateral cataracts removed    . CARDIAC CATHETERIZATION  12/2011  . COLONOSCOPY    . LEFT HEART CATHETERIZATION WITH CORONARY ANGIOGRAM N/A 11/12/2011   Procedure: LEFT HEART CATHETERIZATION WITH CORONARY ANGIOGRAM;  Surgeon: Laverda Page, MD;  Location[facility] CATH LAB;  Service: Cardiovascular;  Laterality: N/A;  . SHOULDER SURGERY      reports that he has never smoked. He has never used smokeless tobacco. He reports current alcohol use. He reports that he does not use drugs. family history includes Arthritis in his mother; Cancer in his father; Diabetes in his father and maternal grandfather; Hypertension in his father; Stroke in his mother. Allergies  Allergen Reactions  . Pollen Extract Other (See Comments)    Stuffy nose   Current Outpatient Medications on File Prior to Visit  Medication Sig Dispense Refill  . aspirin 81 MG chewable tablet Chew by mouth.    [name] atorvastatin (LIPITOR) 40 MG tablet Take 1 tablet (40 mg total) by mouth daily. Needs appointment for future refills 90 tablet 0  . baclofen (LIORESAL) 10 MG tablet Take 1 tablet (10 mg total) by mouth 3 (three) times daily. Prn for IBS flare 30 each 0  . Blood Glucose Monitoring Suppl (ONETOUCH VERIO) w/Device KIT 1 Device by Does  not apply route 2 (two) times daily as needed. 1 kit 0  . dapagliflozin propanediol (FARXIGA) 10 MG TABS tablet Take 10 mg by mouth daily. 90 tablet 3  . dicyclomine (BENTYL) 10 MG capsule TAKE 1 CAPSULE (10 MG TOTAL) BY MOUTH 4 (FOUR) TIMES DAILY - BEFORE MEALS AND AT BEDTIME. 90 capsule 3  . Erenumab-aooe (AIMOVIG) 140 MG/ML SOAJ Inject 140 mg into the skin every 30 (thirty) days. 1 pen 10  . gabapentin (NEURONTIN) 600 MG tablet Take 600 mg by mouth as needed.     [name] ibuprofen (ADVIL,MOTRIN) 600 MG tablet  TAKE 1 TABLET(600 MG) BY MOUTH EVERY 8 HOURS AS NEEDED 30 tablet 0  . metFORMIN (GLUCOPHAGE) 1000 MG tablet Take 1 tablet (1,000 mg total) by mouth 2 (two) times daily with a meal. 180 tablet 3  . methocarbamol (ROBAXIN) 500 MG tablet TAKE 1 TABLET BY MOUTH TWICE DAILY (Patient taking differently: Take 500 mg by mouth every 6 (six) hours as needed. ) 20 tablet 0  . omeprazole (PRILOSEC) 40 MG capsule Take 1 capsule (40 mg total) by mouth daily as needed (indigestion). 90 capsule 1  . sildenafil (REVATIO) 20 MG tablet Take 20 mg by mouth. 3-5 tabs prn    . SUMAtriptan (IMITREX) 100 MG tablet TAKE 1 TABLET(100 MG) BY MOUTH 1 TIME FOR UP TO 1 DOSE AS NEEDED. MAY REPEAT IN 2 HOURS IF HEADACHE PERSISTS OR RECURS 10 tablet 9  . timolol (TIMOPTIC-XR) 0.5 % ophthalmic gel-forming Place 1 drop into both eyes daily.     . verapamil (CALAN-SR) 180 MG CR tablet Take 1 tablet (180 mg total) by mouth daily. 90 tablet 1   No current facility-administered medications on file prior to visit.    Review of Systems Constitutional: Negative for other unusual diaphoresis, sweats, appetite or weight changes HENT: Negative for other worsening hearing loss, ear pain, facial swelling, mouth sores or neck stiffness.   Eyes: Negative for other worsening pain, redness or other visual disturbance.  Respiratory: Negative for other stridor or swelling Cardiovascular: Negative for other palpitations or other chest pain  Gastrointestinal: Negative for worsening diarrhea or loose stools, blood in stool, distention or other pain Genitourinary: Negative for hematuria, flank pain or other change in urine volume.  Musculoskeletal: Negative for myalgias or other joint swelling.  Skin: Negative for other color change, or other wound or worsening drainage.  Neurological: Negative for other syncope or numbness. Hematological: Negative for other adenopathy or swelling Psychiatric/Behavioral: Negative for hallucinations, other worsening  agitation, SI, self-injury, or new decreased concentration All other system neg per pt    Objective:   Physical Exam BP (!) 168/90 (BP Location: Left Arm, Patient Position: Sitting, Cuff Size: Large)   Pulse 67   Temp 98.1 F (36.7 C) (Oral)   Ht _0  (1.702 m)   Wt 213 lb (96.6 kg)   SpO2 99%   BMI 33.36 kg/m  VS noted,  Constitutional: Pt is oriented to person, place, and time. Appears well-developed and well-nourished, in no significant distress and comfortable Head: Normocephalic and atraumatic  Eyes: Conjunctivae and EOM are normal. Pupils are equal, round, and reactive to light Right Ear: External ear normal without discharge Left Ear: External ear normal without discharge Nose: Nose without discharge or deformity Mouth/Throat: Oropharynx is without other ulcerations and moist  Neck: Normal range of motion. Neck supple. No JVD present. No tracheal deviation present or significant neck LA or mass Cardiovascular: Normal rate, regular rhythm, normal heart sounds  and intact distal pulses.   Pulmonary/Chest: WOB normal and breath sounds without rales or wheezing  Abdominal: Soft. Bowel sounds are normal. NT. No HSM  Musculoskeletal: Normal range of motion. Exhibits no edema, right knee with mild patellar tender without effusion, crepitus or decreased ROM Lymphadenopathy: Has no other cervical adenopathy.  Neurological: Pt is alert and oriented to person, place, and time. Pt has normal reflexes. No cranial nerve deficit. Motor grossly intact, Gait intact Skin: Skin is warm and dry. No rash noted or new ulcerations Psychiatric:  Has normal mood and affect. Behavior is normal without agitation No other exam findings Lab Results  Component Value Date   WBC 4.6 08/22/2018   HGB 13.8 08/22/2018   HCT 44.0 08/22/2018   PLT 255.0 08/22/2018   GLUCOSE 126 (H) 08/22/2018   CHOL 147 08/22/2018   TRIG 87.0 08/22/2018   HDL 54.50 08/22/2018   LDLCALC 75 08/22/2018   ALT 23 08/22/2018    AST 20 08/22/2018   NA 141 08/22/2018   K 4.0 08/22/2018   CL 104 08/22/2018   CREATININE 0.97 08/22/2018   BUN 15 08/22/2018   CO2 29 08/22/2018   TSH 0.98 08/22/2018   PSA 0.59 08/22/2018   HGBA1C 6.7 (A) 08/15/2018   MICROALBUR 1.6 06/06/2016       Assessment & Plan:

## 2018-08-22 NOTE — Patient Instructions (Addendum)
You had the Prevnar13 pneumonia shot today  Please take all new medication as prescribed - the voltaren pill and gell as needed for pain  Please continue all other medications as before, and refills have been done if requested.  Please have the pharmacy call with any other refills you may need.  Please continue your efforts at being more active, low cholesterol diet, and weight control.  You are otherwise up to date with prevention measures today.  Please keep your appointments with your specialists as you may have planned  You will be contacted regarding the referral for: Dr Tamala Julian  Please go to the LAB in the Basement (turn left off the elevator) for the tests to be done today  You will be contacted by phone if any changes need to be made immediately.  Otherwise, you will receive a letter about your results with an explanation, but please check with MyChart first.  Please remember to sign up for MyChart if you have not done so, as this will be important to you in the future with finding out test results, communicating by private email, and scheduling acute appointments online when needed.  Please return in 6 months, or sooner if needed, with Lab testing done 3-5 days before

## 2018-08-23 ENCOUNTER — Other Ambulatory Visit: Payer: Self-pay | Admitting: Internal Medicine

## 2018-08-23 ENCOUNTER — Encounter: Payer: Self-pay | Admitting: Internal Medicine

## 2018-08-23 MED ORDER — VITAMIN D (ERGOCALCIFEROL) 1.25 MG (50000 UNIT) PO CAPS: 50000.0000 [IU] | ORAL_CAPSULE | ORAL | 0 refills | Status: DC

## 2018-08-23 NOTE — Assessment & Plan Note (Signed)
stable overall by history and exam, recent data reviewed with pt, and pt to continue medical treatment as before,  to f/u any worsening symptoms or concerns  

## 2018-08-23 NOTE — Assessment & Plan Note (Addendum)
Recent worsening, suspected subpatellar cartilage loss vs other, for diclofenac prn, volt gel prn, and refer sport medicine for further consideration  In addition to the time spent performing CPE, I spent an additional 25 minutes face to face,in which greater than 50% of this time was spent in counseling and coordination of care for patient's acute illness as documented, including the differential dx, treatment, further evaluation and other management of right knee pain, DM, iron deficiency anemia, HTN, anxiety depression

## 2018-08-23 NOTE — Assessment & Plan Note (Signed)

## 2018-08-23 NOTE — Assessment & Plan Note (Signed)
Also for iron level f/u,  to f/u any worsening symptoms or concerns

## 2018-08-24 ENCOUNTER — Encounter: Payer: Self-pay | Admitting: Internal Medicine

## 2018-08-25 ENCOUNTER — Telehealth: Payer: Self-pay | Admitting: Internal Medicine

## 2018-08-25 ENCOUNTER — Telehealth: Payer: Self-pay

## 2018-08-25 LAB — HEPATITIS C ANTIBODY
Hepatitis C Ab: NONREACTIVE
SIGNAL TO CUT-OFF: 0.02 (ref <1.00)

## 2018-08-25 LAB — HIV ANTIBODY (ROUTINE TESTING W REFLEX): HIV 1&2 Ab, 4th Generation: NONREACTIVE

## 2018-08-25 MED ORDER — VITAMIN D (ERGOCALCIFEROL) 1.25 MG (50000 UNIT) PO CAPS: 50000.0000 [IU] | ORAL_CAPSULE | ORAL | 0 refills | Status: DC

## 2018-08-25 NOTE — Addendum Note (Signed)
Addended by: Juliet Rude on: 08/25/2018 01:26 PM   Modules accepted: Orders

## 2018-08-25 NOTE — Telephone Encounter (Signed)
-----   Message from Biagio Borg, MD sent at 08/23/2018  1:58 PM EDT ----- Left message on MyChart, pt to cont same tx except  The test results show that your current treatment is OK, as the tests are stable except the Vitamin D level is low.  Please take Vitamin D 50000 units weekly for 12 weeks, then plan to change to OTC Vitamin D3 at 2000 units per day, indefinitely.    Shirron to please inform pt, I will do rx

## 2018-08-25 NOTE — Telephone Encounter (Signed)
Copied from [place] [id number]. Topic: Quick Communication - Rx Refill/Question >> Aug 25, 2018 11:46 AM Jason Ortega wrote: Medication: Vitamin D, Ergocalciferol, (DRISDOL) 1.25 MG (50000 UT) CAPS capsule  Has the patient contacted their pharmacy? Yes  (Agent: If no, request that the patient contact the pharmacy for the refill.) (Agent: If yes, when and what did the pharmacy advise?)  Preferred Pharmacy (with phone number or street name):[facility] [phone] Pt would like to pick up all meds at one time  Agent: Please be advised that RX refills may take up to 3 business days. We ask that you follow-up with your pharmacy.

## 2018-08-25 NOTE — Telephone Encounter (Signed)
Pt has viewed results via MyChart  

## 2018-08-25 NOTE — Telephone Encounter (Signed)
[place] for shirron to refill med to the [facility], though I did already send to his mail in pharmacy

## 2018-09-06 ENCOUNTER — Encounter: Payer: Self-pay | Admitting: Neurology

## 2018-09-06 ENCOUNTER — Telehealth: Payer: Self-pay | Admitting: Neurology

## 2018-09-06 NOTE — Telephone Encounter (Signed)
Patient in the ED with a migraine he will pick up letter Monday

## 2018-09-08 ENCOUNTER — Encounter: Payer: Self-pay | Admitting: Neurology

## 2018-09-08 NOTE — Telephone Encounter (Signed)
Letter signed by Dr. Jaynee Eagles and is at the front desk for pt pickup.

## 2018-09-09 ENCOUNTER — Other Ambulatory Visit: Payer: Self-pay | Admitting: Internal Medicine

## 2018-09-16 ENCOUNTER — Telehealth: Payer: Self-pay | Admitting: *Deleted

## 2018-09-16 DIAGNOSIS — Z0289 Encounter for other administrative examinations: Secondary | ICD-10-CM

## 2018-09-16 NOTE — Telephone Encounter (Signed)
Received FMLA for pt to be filled out.

## 2018-09-17 NOTE — Telephone Encounter (Signed)
Completed and signed.  To MR Hilda Blades) .

## 2018-09-17 NOTE — Telephone Encounter (Signed)
FMLA completed and sent to AL/NP for review and signature.

## 2018-09-17 NOTE — Telephone Encounter (Addendum)
R/c fmla paperwork I faxed to [phone] attn Eboni Clemons

## 2018-09-18 ENCOUNTER — Other Ambulatory Visit: Payer: Self-pay

## 2018-09-18 MED ORDER — ASPIRIN 81 MG PO CHEW: 81.0000 mg | CHEWABLE_TABLET | Freq: Every day | ORAL | 0 refills | Status: DC

## 2018-09-18 NOTE — Assessment & Plan Note (Signed)
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  Based on these results there is no indication for IV iron therapy. He will be seen on an as-needed basis.

## 2018-09-22 ENCOUNTER — Other Ambulatory Visit: Payer: Self-pay

## 2018-09-22 ENCOUNTER — Inpatient Hospital Stay: Payer: 59 | Attending: Hematology and Oncology

## 2018-09-22 DIAGNOSIS — M25571 Pain in right ankle and joints of right foot: Secondary | ICD-10-CM | POA: Diagnosis not present

## 2018-09-22 DIAGNOSIS — M25561 Pain in right knee: Secondary | ICD-10-CM | POA: Diagnosis not present

## 2018-09-22 DIAGNOSIS — M79605 Pain in left leg: Secondary | ICD-10-CM | POA: Diagnosis not present

## 2018-09-22 DIAGNOSIS — D508 Other iron deficiency anemias: Secondary | ICD-10-CM

## 2018-09-22 DIAGNOSIS — D509 Iron deficiency anemia, unspecified: Secondary | ICD-10-CM | POA: Insufficient documentation

## 2018-09-22 LAB — CBC WITH DIFFERENTIAL (CANCER CENTER ONLY)
Abs Immature Granulocytes: 0.02 10*3/uL (ref 0.00–0.07)
Basophils Absolute: 0 10*3/uL (ref 0.0–0.1)
Basophils Relative: 1 %
Eosinophils Absolute: 0.1 10*3/uL (ref 0.0–0.5)
Eosinophils Relative: 2 %
HCT: 44.2 % (ref 39.0–52.0)
Hemoglobin: 13.6 g/dL (ref 13.0–17.0)
Immature Granulocytes: 0 %
Lymphocytes Relative: 33 %
Lymphs Abs: 2 10*3/uL (ref 0.7–4.0)
MCH: 26.4 pg (ref 26.0–34.0)
MCHC: 30.8 g/dL (ref 30.0–36.0)
MCV: 85.7 fL (ref 80.0–100.0)
Monocytes Absolute: 0.5 10*3/uL (ref 0.1–1.0)
Monocytes Relative: 8 %
Neutro Abs: 3.4 10*3/uL (ref 1.7–7.7)
Neutrophils Relative %: 56 %
Platelet Count: 261 10*3/uL (ref 150–400)
RBC: 5.16 MIL/uL (ref 4.22–5.81)
RDW: 14.3 % (ref 11.5–15.5)
WBC Count: 6.1 10*3/uL (ref 4.0–10.5)
nRBC: 0 % (ref 0.0–0.2)

## 2018-09-23 LAB — IRON AND TIBC
Iron: 86 ug/dL (ref 42–163)
Saturation Ratios: 25 % (ref 20–55)
TIBC: 348 ug/dL (ref 202–409)
UIBC: 261 ug/dL (ref 117–376)

## 2018-09-23 LAB — FERRITIN: Ferritin: 58 ng/mL (ref 24–336)

## 2018-09-24 NOTE — Progress Notes (Signed)
Patient Care Team: Biagio Borg, MD as PCP - General (Internal Medicine)  DIAGNOSIS:    ICD-10-CM   1. Other iron deficiency anemia  D50.8 Iron and TIBC    Ferritin    CBC with Differential (Cancer Center Only)    CHIEF COMPLIANT: Follow-up of iron deficiency anemia  INTERVAL HISTORY: Jason Ortega is a 53 y.o. with above-mentioned history of iron deficiency anemia previously treated with IV iron. Labs from 09/22/18 show: Hg 13.6, HCT 44.2, MCV 85.7, iron saturation 25%, ferritin 58. He presents to the clinic today for follow-up to review recent labs.  His complaints are related to pain in his right knee and ankle as well as the left leg.  These are unrelated to his iron deficiency.  REVIEW OF SYSTEMS:   Constitutional: Denies fevers, chills or abnormal weight loss Eyes: Denies blurriness of vision Ears, nose, mouth, throat, and face: Denies mucositis or sore throat Respiratory: Denies cough, dyspnea or wheezes Cardiovascular: Denies palpitation, chest discomfort Gastrointestinal: Denies nausea, heartburn or change in bowel habits Skin: Denies abnormal skin rashes Lymphatics: Denies new lymphadenopathy or easy bruising Neurological: Denies numbness, tingling or new weaknesses Behavioral/Psych: Mood is stable, no new changes  Extremities: Pain in his left leg and right knee and ankle Breast: denies any pain or lumps or nodules in either breasts All other systems were reviewed with the patient and are negative.  I have reviewed the past medical history, past surgical history, social history and family history with the patient and they are unchanged from previous note.  ALLERGIES:  is allergic to pollen extract.  MEDICATIONS:  Current Outpatient Medications  Medication Sig Dispense Refill  . aspirin 81 MG chewable tablet Chew 1 tablet (81 mg total) by mouth daily. 1 tablet 0  . atorvastatin (LIPITOR) 40 MG tablet Take 1 tablet (40 mg total) by mouth daily. 90 tablet 1  .  baclofen (LIORESAL) 10 MG tablet Take 1 tablet (10 mg total) by mouth 3 (three) times daily. Prn for IBS flare 30 each 0  . Blood Glucose Monitoring Suppl (ONETOUCH VERIO) w/Device KIT 1 Device by Does not apply route 2 (two) times daily as needed. 1 kit 0  . dapagliflozin propanediol (FARXIGA) 10 MG TABS tablet Take 10 mg by mouth daily. 90 tablet 3  . diclofenac (VOLTAREN) 75 MG EC tablet Take 1 tablet (75 mg total) by mouth 2 (two) times daily. 60 tablet 5  . diclofenac sodium (VOLTAREN) 1 % GEL Apply 2 g topically 4 (four) times daily. 200 g 5  . dicyclomine (BENTYL) 10 MG capsule TAKE 1 CAPSULE (10 MG TOTAL) BY MOUTH 4 (FOUR) TIMES DAILY - BEFORE MEALS AND AT BEDTIME. 90 capsule 3  . Erenumab-aooe (AIMOVIG) 140 MG/ML SOAJ Inject 140 mg into the skin every 30 (thirty) days. 1 pen 10  . gabapentin (NEURONTIN) 600 MG tablet Take 600 mg by mouth as needed.     [name] ibuprofen (ADVIL,MOTRIN) 600 MG tablet TAKE 1 TABLET(600 MG) BY MOUTH EVERY 8 HOURS AS NEEDED 30 tablet 0  . losartan-hydrochlorothiazide (HYZAAR) 100-25 MG tablet Take 1 tablet by mouth daily. 90 tablet 3  . metFORMIN (GLUCOPHAGE) 1000 MG tablet Take 1 tablet (1,000 mg total) by mouth 2 (two) times daily with a meal. 180 tablet 3  . methocarbamol (ROBAXIN) 500 MG tablet TAKE 1 TABLET BY MOUTH TWICE DAILY (Patient taking differently: Take 500 mg by mouth every 6 (six) hours as needed. ) 20 tablet 0  . omeprazole (PRILOSEC) 40  MG capsule Take 1 capsule (40 mg total) by mouth daily as needed (indigestion). 90 capsule 1  . sildenafil (REVATIO) 20 MG tablet Take 20 mg by mouth. 3-5 tabs prn    . SUMAtriptan (IMITREX) 100 MG tablet TAKE 1 TABLET(100 MG) BY MOUTH 1 TIME FOR UP TO 1 DOSE AS NEEDED. MAY REPEAT IN 2 HOURS IF HEADACHE PERSISTS OR RECURS 10 tablet 9  . timolol (TIMOPTIC-XR) 0.5 % ophthalmic gel-forming Place 1 drop into both eyes daily.     . verapamil (CALAN-SR) 180 MG CR tablet Take 1 tablet (180 mg total) by mouth daily. 90 tablet 1   . Vitamin D, Ergocalciferol, (DRISDOL) 1.25 MG (50000 UT) CAPS capsule Take 1 capsule (50,000 Units total) by mouth every 7 (seven) days. 12 capsule 0   No current facility-administered medications for this visit.     PHYSICAL EXAMINATION: ECOG PERFORMANCE STATUS: 1 - Symptomatic but completely ambulatory  Vitals:   09/25/18 0834  BP: (!) 168/92  Pulse: 61  Resp: 18  Temp: 98.7 F (37.1 C)  SpO2: 100%   Filed Weights   09/25/18 0834  Weight: 214 lb 12.8 oz (97.4 kg)    GENERAL: alert, no distress and comfortable SKIN: skin color, texture, turgor are normal, no rashes or significant lesions EYES: normal, Conjunctiva are pink and non-injected, sclera clear OROPHARYNX: no exudate, no erythema and lips, buccal mucosa, and tongue normal  NECK: supple, thyroid normal size, non-tender, without nodularity LYMPH: no palpable lymphadenopathy in the cervical, axillary or inguinal LUNGS: clear to auscultation and percussion with normal breathing effort HEART: regular rate & rhythm and no murmurs and no lower extremity edema ABDOMEN: abdomen soft, non-tender and normal bowel sounds MUSCULOSKELETAL: no cyanosis of digits and no clubbing  NEURO: alert & oriented x 3 with fluent speech, no focal motor/sensory deficits EXTREMITIES: No lower extremity edema  LABORATORY DATA:  I have reviewed the data as listed CMP Latest Ref Rng & Units 08/22/2018 09/03/2017 04/05/2017  Glucose 70 - 99 mg/dL 126(H) 135(H) 98  BUN 6 - 23 mg/dL '15 14 14  '$ Creatinine 0.40 - 1.50 mg/dL 0.97 1.17 1.05  Sodium 135 - 145 mEq/L 141 139 142  Potassium 3.5 - 5.1 mEq/L 4.0 3.6 3.7  Chloride 96 - 112 mEq/L 104 102 103  CO2 19 - 32 mEq/L '29 29 30  '$ Calcium 8.4 - 10.5 mg/dL 9.2 9.2 9.7  Total Protein 6.0 - 8.3 g/dL 7.2 8.0 -  Total Bilirubin 0.2 - 1.2 mg/dL 0.3 0.4 -  Alkaline Phos 39 - 117 U/L 61 49 -  AST 0 - 37 U/L 20 25 -  ALT 0 - 53 U/L 23 20 -    Lab Results  Component Value Date   WBC 6.1 09/22/2018   HGB  13.6 09/22/2018   HCT 44.2 09/22/2018   MCV 85.7 09/22/2018   PLT 261 09/22/2018   NEUTROABS 3.4 09/22/2018    ASSESSMENT & PLAN:  Iron deficiency anemia Possible etiology occult blood loss IV iron: September 2019 Lab review: 03/24/2018: Hemoglobin 13.3, MCV 83.6, RDW 15.9  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  Based on these results there is no indication for IV iron therapy. He will be seen in 1 year with labs and follow-up. If at that time his labs are normal then we will have to see him again.    Orders Placed This Encounter  Procedures  . Iron and TIBC    Standing Status:  Future    Standing Expiration Date:   09/25/2019  . Ferritin    Standing Status:   Future    Standing Expiration Date:   09/25/2019  . CBC with Differential (Cancer Center Only)    Standing Status:   Future    Standing Expiration Date:   09/25/2019   The patient has a good understanding of the overall plan. he agrees with it. he will call with any problems that may develop before the next visit here.  Nicholas Lose, MD 09/25/2018  Julious Oka Dorshimer am acting as scribe for Dr. Nicholas Lose.  I have reviewed the above documentation for accuracy and completeness, and I agree with the above.

## 2018-09-25 ENCOUNTER — Inpatient Hospital Stay (HOSPITAL_BASED_OUTPATIENT_CLINIC_OR_DEPARTMENT_OTHER): Payer: 59 | Admitting: Hematology and Oncology

## 2018-09-25 ENCOUNTER — Other Ambulatory Visit: Payer: Self-pay

## 2018-09-25 DIAGNOSIS — D509 Iron deficiency anemia, unspecified: Secondary | ICD-10-CM | POA: Diagnosis not present

## 2018-09-25 DIAGNOSIS — D508 Other iron deficiency anemias: Secondary | ICD-10-CM | POA: Diagnosis not present

## 2018-09-26 ENCOUNTER — Telehealth: Payer: Self-pay | Admitting: Hematology and Oncology

## 2018-09-26 NOTE — Telephone Encounter (Signed)
I talk with patient regarding schedule  

## 2018-10-03 ENCOUNTER — Ambulatory Visit: Payer: Self-pay | Admitting: *Deleted

## 2018-10-03 ENCOUNTER — Other Ambulatory Visit: Payer: Self-pay

## 2018-10-03 NOTE — Telephone Encounter (Signed)
   Reason for Disposition . [1] Ulcer, wound, blister or sore AND [2] unchanged since last examination    Patient has hard, painful place on the bottom of his left foot.  Answer Assessment - Initial Assessment Questions 1. SYMPTOM: "What's the main symptom you're concerned about?" (e.g., rash, sore, callus, drainage, numbness)     Painful callus- hard area on bottom of left foot near base of 5th toe on ball of foot  2. LOCATION: "Where is the painful callus located?" (e.g., foot/toe, top/bottom, left/right)     Bottom of left foot near base of 5th toe  3. ONSET: "When did the callus  start?"     Noticed hard place on the bottom of foot beginning of July, became painful 2 weeks ago 4. PAIN: "Is there any pain?" If so, ask: "How bad is it?" (Scale: 1-10; mild, moderate, severe)     Moderate and worse with prolong walking or standing, wakes him up at night sometimes   5. CAUSE: "What do you think is causing the symptoms?"     Possibly shoes  6. OTHER SYMPTOMS: "Do you have any other symptoms?" (e.g., fever, weakness)     Denies fever, weakness, or other wounds  7. PREGNANCY: "Is there any chance you are pregnant?" "When was your last menstrual period?"     N/A  Protocols used: DIABETES - FOOT PROBLEMS AND QUESTIONS-A-AH  Patient states he has a hard painful place on the bottom of his left foot about the size of a quarter that has been there since the beginning of July.  States the callus area became painful about 2 weeks ago, and pain is getting worse with standing and walking, and wakes him up at night.  He states the area is not an open wound, and is not draining and does not have redness or streaks around it.  He denies running a fever.  He states the main concern is that it painful and he works on his feet all day.  Given that it is late in the day, and patient's PCP office is closed for the day- advised patient to go to Urgent Care for treatment tomorrow while he is off of work due to his  foot pain and having to work on his feet.  Advised him to schedule a follow up appointment with Dr. Jenny Reichmann regarding the callus area at his earliest convenience.  Patient expressed understanding and is agreeable with plan.

## 2018-10-08 NOTE — Telephone Encounter (Signed)
Pt is asking for a call from RN to discuss his being denied for FMLA and why.  Please call

## 2018-10-09 NOTE — Telephone Encounter (Signed)
I called pt and he stated that he is now approved, (was not approved previously)..  Not sure what changed.  (could have been related to pts visits per year needed).  Nothing needed at this time.

## 2018-10-10 ENCOUNTER — Other Ambulatory Visit: Payer: Self-pay

## 2018-10-10 ENCOUNTER — Ambulatory Visit (INDEPENDENT_AMBULATORY_CARE_PROVIDER_SITE_OTHER): Payer: 59 | Admitting: Internal Medicine

## 2018-10-10 ENCOUNTER — Encounter: Payer: Self-pay | Admitting: Internal Medicine

## 2018-10-10 VITALS — BP 124/84 | HR 76 | Temp 97.8°F | Ht 67.0 in | Wt 212.0 lb

## 2018-10-10 DIAGNOSIS — I10 Essential (primary) hypertension: Secondary | ICD-10-CM

## 2018-10-10 DIAGNOSIS — L84 Corns and callosities: Secondary | ICD-10-CM

## 2018-10-10 DIAGNOSIS — E119 Type 2 diabetes mellitus without complications: Secondary | ICD-10-CM | POA: Diagnosis not present

## 2018-10-10 MED ORDER — DICLOFENAC SODIUM 1 % TD GEL: 2.0000 g | Freq: Four times a day (QID) | TRANSDERMAL | 5 refills | Status: DC

## 2018-10-10 NOTE — Patient Instructions (Signed)
Please take all new medication as prescribed - the voltaren gel as needed for pain  You will be contacted regarding the referral for: Podiatry  Please continue all other medications as before, and refills have been done if requested.  Please have the pharmacy call with any other refills you may need.  Please continue your efforts at being more active, low cholesterol diet, and weight control  Please keep your appointments with your specialists as you may have planned

## 2018-10-10 NOTE — Progress Notes (Signed)
Subjective:    Patient ID: Jason Ortega, male    DOB: 12-18-1965, 53 y.o.   MRN: [id number]  HPI  Here to f/u; overall doing ok,  Pt denies chest pain, increasing sob or doe, wheezing, orthopnea, PND, increased LE swelling, palpitations, dizziness or syncope.  Pt denies new neurological symptoms such as new headache, or facial or extremity weakness or numbness.  Pt denies polydipsia, polyuria, or low sugar episode.  Pt states overall good compliance with meds, mostly trying to follow appropriate diet, with wt overall stable,  but little exercise however.  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  No ulcer, fever, chills or redness getting worse.  Overall mild, but with recent worsening, nothing else makes better or worse Past Medical History:  Diagnosis Date  . Allergy   . Anemia   . Arthritis   . Cataract    bilateral cataracts removed  . Depression   . Diabetes mellitus   . Frequent headaches   . GERD (gastroesophageal reflux disease)   . Glaucoma   . Hypercholesteremia   . Hypertension   . Migraines   . Sleep apnea    wears c-PAP   Past Surgical History:  Procedure Laterality Date  . bilateral cataracts removed    . CARDIAC CATHETERIZATION  12/2011  . COLONOSCOPY    . LEFT HEART CATHETERIZATION WITH CORONARY ANGIOGRAM N/A 11/12/2011   Procedure: LEFT HEART CATHETERIZATION WITH CORONARY ANGIOGRAM;  Surgeon: Laverda Page, MD;  Location: [facility] CATH LAB;  Service: Cardiovascular;  Laterality: N/A;  . SHOULDER SURGERY      reports that he has never smoked. He has never used smokeless tobacco. He reports current alcohol use. He reports that he does not use drugs. family history includes Arthritis in his mother; Cancer in his father; Diabetes in his father and maternal grandfather; Hypertension in his father; Stroke in his mother. Allergies  Allergen  Reactions  . Pollen Extract Other (See Comments)    Stuffy nose   Current Outpatient Medications on File Prior to Visit  Medication Sig Dispense Refill  . aspirin 81 MG chewable tablet Chew 1 tablet (81 mg total) by mouth daily. 1 tablet 0  . atorvastatin (LIPITOR) 40 MG tablet Take 1 tablet (40 mg total) by mouth daily. 90 tablet 1  . baclofen (LIORESAL) 10 MG tablet Take 1 tablet (10 mg total) by mouth 3 (three) times daily. Prn for IBS flare 30 each 0  . Blood Glucose Monitoring Suppl (ONETOUCH VERIO) w/Device KIT 1 Device by Does not apply route 2 (two) times daily as needed. 1 kit 0  . dapagliflozin propanediol (FARXIGA) 10 MG TABS tablet Take 10 mg by mouth daily. 90 tablet 3  . diclofenac (VOLTAREN) 75 MG EC tablet Take 1 tablet (75 mg total) by mouth 2 (two) times daily. 60 tablet 5  . dicyclomine (BENTYL) 10 MG capsule TAKE 1 CAPSULE (10 MG TOTAL) BY MOUTH 4 (FOUR) TIMES DAILY - BEFORE MEALS AND AT BEDTIME. 90 capsule 3  . Erenumab-aooe (AIMOVIG) 140 MG/ML SOAJ Inject 140 mg into the skin every 30 (thirty) days. 1 pen 10  . gabapentin (NEURONTIN) 600 MG tablet Take 600 mg by mouth as needed.     [name] ibuprofen (ADVIL,MOTRIN) 600 MG tablet TAKE 1 TABLET(600 MG) BY MOUTH EVERY 8 HOURS AS NEEDED 30 tablet 0  . losartan-hydrochlorothiazide (HYZAAR)  100-25 MG tablet Take 1 tablet by mouth daily. 90 tablet 3  . metFORMIN (GLUCOPHAGE) 1000 MG tablet Take 1 tablet (1,000 mg total) by mouth 2 (two) times daily with a meal. 180 tablet 3  . methocarbamol (ROBAXIN) 500 MG tablet TAKE 1 TABLET BY MOUTH TWICE DAILY (Patient taking differently: Take 500 mg by mouth every 6 (six) hours as needed. ) 20 tablet 0  . omeprazole (PRILOSEC) 40 MG capsule Take 1 capsule (40 mg total) by mouth daily as needed (indigestion). 90 capsule 1  . sildenafil (REVATIO) 20 MG tablet Take 20 mg by mouth. 3-5 tabs prn    . SUMAtriptan (IMITREX) 100 MG tablet TAKE 1 TABLET(100 MG) BY MOUTH 1 TIME FOR UP TO 1 DOSE AS NEEDED.  MAY REPEAT IN 2 HOURS IF HEADACHE PERSISTS OR RECURS 10 tablet 9  . timolol (TIMOPTIC-XR) 0.5 % ophthalmic gel-forming Place 1 drop into both eyes daily.     . verapamil (CALAN-SR) 180 MG CR tablet Take 1 tablet (180 mg total) by mouth daily. 90 tablet 1  . Vitamin D, Ergocalciferol, (DRISDOL) 1.25 MG (50000 UT) CAPS capsule Take 1 capsule (50,000 Units total) by mouth every 7 (seven) days. 12 capsule 0   No current facility-administered medications on file prior to visit.    Review of Systems  Constitutional: Negative for other unusual diaphoresis or sweats HENT: Negative for ear discharge or swelling Eyes: Negative for other worsening visual disturbances Respiratory: Negative for stridor or other swelling  Gastrointestinal: Negative for worsening distension or other blood Genitourinary: Negative for retention or other urinary change Musculoskeletal: Negative for other MSK pain or swelling Skin: Negative for color change or other new lesions Neurological: Negative for worsening tremors and other numbness  Psychiatric/Behavioral: Negative for worsening agitation or other fatigue All other system neg per pt    Objective:   Physical Exam BP 124/84   Pulse 76   Temp 97.8 F (36.6 C) (Oral)   Ht _0  (1.702 m)   Wt 212 lb (96.2 kg)   SpO2 98%   BMI 33.20 kg/m  VS noted,  Constitutional: Pt appears in NAD HENT: Head: NCAT.  Right Ear: External ear normal.  Left Ear: External ear normal.  Eyes: . Pupils are equal, round, and reactive to light. Conjunctivae and EOM are normal Nose: without d/c or deformity Neck: Neck supple. Gross normal ROM Cardiovascular: Normal rate and regular rhythm.   Pulmonary/Chest: Effort normal and breath sounds without rales or wheezing.  Neurological: Pt is alert. At baseline orientation, motor grossly intact Skin: Skin is warm. No rashes, other new lesions, no LE edema, but left foot plantar and lateral foot 5th mtp area mild to mod tender callous  without ulceration or erythema, dorsalis pedis pulse 1+ bilat Psychiatric: Pt behavior is normal without agitation  No other exam findings    Assessment & Plan:

## 2018-10-11 ENCOUNTER — Encounter: Payer: Self-pay | Admitting: Internal Medicine

## 2018-10-11 NOTE — Assessment & Plan Note (Signed)
With recent worsening pain without ulceration, for topical volt gel prn, may need to avoid wearing work boots for any worsening, and refer podiatry

## 2018-10-11 NOTE — Assessment & Plan Note (Signed)
stable overall by history and exam, recent data reviewed with pt, and pt to continue medical treatment as before,  to f/u any worsening symptoms or concerns  

## 2018-10-17 ENCOUNTER — Ambulatory Visit: Payer: 59 | Admitting: Family Medicine

## 2018-10-17 ENCOUNTER — Encounter: Payer: Self-pay | Admitting: Internal Medicine

## 2018-10-21 ENCOUNTER — Telehealth: Payer: Self-pay | Admitting: *Deleted

## 2018-10-21 NOTE — Telephone Encounter (Signed)
Pt FMLA form re faxed on 10/21/18

## 2018-10-21 NOTE — Telephone Encounter (Signed)
I spoke to pt and and he is needing the FMLA recently submitted pg 2 #1, that pt will need to have visits at least twice per year due to condition needs to be changed to YES to approval.

## 2018-10-21 NOTE — Telephone Encounter (Signed)
Changes made, signed (addended form) given to Hilda Blades in MR.  She will call pt.

## 2018-10-21 NOTE — Telephone Encounter (Signed)
Pt called, has question about his FMLA paper-work. Please call [phone]

## 2018-10-23 ENCOUNTER — Telehealth: Payer: Self-pay

## 2018-10-23 NOTE — Telephone Encounter (Signed)
Spoke with patient and scheduled for next available appointment.

## 2018-10-24 ENCOUNTER — Other Ambulatory Visit: Payer: Self-pay | Admitting: Internal Medicine

## 2018-10-24 ENCOUNTER — Other Ambulatory Visit: Payer: Self-pay

## 2018-10-24 ENCOUNTER — Ambulatory Visit (INDEPENDENT_AMBULATORY_CARE_PROVIDER_SITE_OTHER): Payer: 59 | Admitting: Podiatry

## 2018-10-24 VITALS — Temp 98.1°F

## 2018-10-24 DIAGNOSIS — Q828 Other specified congenital malformations of skin: Secondary | ICD-10-CM | POA: Diagnosis not present

## 2018-10-24 DIAGNOSIS — M7752 Other enthesopathy of left foot: Secondary | ICD-10-CM

## 2018-10-24 DIAGNOSIS — M7751 Other enthesopathy of right foot: Secondary | ICD-10-CM | POA: Diagnosis not present

## 2018-11-09 ENCOUNTER — Other Ambulatory Visit: Payer: Self-pay | Admitting: Internal Medicine

## 2018-11-12 ENCOUNTER — Other Ambulatory Visit: Payer: Self-pay

## 2018-11-14 ENCOUNTER — Encounter: Payer: Self-pay | Admitting: Endocrinology

## 2018-11-14 ENCOUNTER — Other Ambulatory Visit: Payer: Self-pay

## 2018-11-14 ENCOUNTER — Ambulatory Visit (INDEPENDENT_AMBULATORY_CARE_PROVIDER_SITE_OTHER): Payer: 59 | Admitting: Endocrinology

## 2018-11-14 VITALS — BP 136/84 | HR 70 | Ht 67.0 in | Wt 212.2 lb

## 2018-11-14 DIAGNOSIS — E119 Type 2 diabetes mellitus without complications: Secondary | ICD-10-CM | POA: Diagnosis not present

## 2018-11-14 LAB — POCT GLYCOSYLATED HEMOGLOBIN (HGB A1C): Hemoglobin A1C: 8.2 % — AB (ref 4.0–5.6)

## 2018-11-14 MED ORDER — OZEMPIC (0.25 OR 0.5 MG/DOSE) 2 MG/1.5ML ~~LOC~~ SOPN: 0.2500 mg | PEN_INJECTOR | SUBCUTANEOUS | 11 refills | Status: DC

## 2018-11-14 NOTE — Patient Instructions (Addendum)
I have sent a prescription to your pharmacy, to add "Ozempic."  If you have no nausea with this, you can double it.  Please continue the same other other diabetes medications. Please also try to go off the insulin. check your blood sugar twice a day.  vary the time of day when you check, between before the 3 meals, and at bedtime.  also check if you have symptoms of your blood sugar being too high or too low.  please keep a record of the readings and bring it to your next appointment here (or you can bring the meter itself).  You can write it on any piece of paper.  please call us sooner if your blood sugar goes below 70, or if you have a lot of readings over 200.  Please come back for a follow-up appointment in 2 months.

## 2018-11-14 NOTE — Progress Notes (Signed)
Subjective:    Patient ID: Jason Ortega, male    DOB: 07/24/1965, 53 y.o.   MRN: [id number]  HPI Pt returns for f/u of diabetes mellitus: DM type: Insulin-requiring type 2.  Dx'ed: 6579 Complications: polyneuropathy Therapy: insulin off and on since 2017, and 2 oral meds DKA: never Severe hypoglycemia: never Pancreatitis: never.  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 Interval history: 3 weeks ago, he resumed tresiba, 5 units qam.  pt states he feels well in general.  no cbg record, but states cbg's are 130-200's.   Past Medical History:  Diagnosis Date  . Allergy   . Anemia   . Arthritis   . Cataract    bilateral cataracts removed  . Depression   . Diabetes mellitus   . Frequent headaches   . GERD (gastroesophageal reflux disease)   . Glaucoma   . Hypercholesteremia   . Hypertension   . Migraines   . Sleep apnea    wears c-PAP    Past Surgical History:  Procedure Laterality Date  . bilateral cataracts removed    . CARDIAC CATHETERIZATION  12/2011  . COLONOSCOPY    . LEFT HEART CATHETERIZATION WITH CORONARY ANGIOGRAM N/A 11/12/2011   Procedure: LEFT HEART CATHETERIZATION WITH CORONARY ANGIOGRAM;  Surgeon: Laverda Page, MD;  Location: [facility] CATH LAB;  Service: Cardiovascular;  Laterality: N/A;  . SHOULDER SURGERY      Social History   Socioeconomic History  . Marital status: Married    Spouse name: Not on file  . Number of children: 2  . Years of education: 50  . Highest education level: Not on file  Occupational History  . Occupation: Clinical research associate  Social Needs  . Financial resource strain: Not on file  . Food insecurity    Worry: Not on file    Inability: Not on file  . Transportation needs    Medical: Not on file    Non-medical: Not on file  Tobacco Use  . Smoking status: Never Smoker  . Smokeless tobacco: Never Used  Substance and Sexual Activity  . Alcohol use: Yes   Comment: occasional  . Drug use: No  . Sexual activity: Yes    Birth control/protection: None  Lifestyle  . Physical activity    Days per week: Not on file    Minutes per session: Not on file  . Stress: Not on file  Relationships  . Social Herbalist on phone: Not on file    Gets together: Not on file    Attends religious service: Not on file    Active member of club or organization: Not on file    Attends meetings of clubs or organizations: Not on file    Relationship status: Not on file  . Intimate partner violence    Fear of current or ex partner: Not on file    Emotionally abused: Not on file    Physically abused: Not on file    Forced sexual activity: Not on file  Other Topics Concern  . Not on file  Social History Narrative   Fun/Hobby: Coach football    Current Outpatient Medications on File Prior to Visit  Medication Sig Dispense Refill  . aspirin 81 MG chewable tablet CHEW 1 TABLET (81 MG TOTAL) BY MOUTH DAILY. 36 tablet 0  . atorvastatin (LIPITOR) 40 MG tablet Take 1 tablet (40 mg total) by mouth daily. 90 tablet 1  .  baclofen (LIORESAL) 10 MG tablet Take 1 tablet (10 mg total) by mouth 3 (three) times daily. Prn for IBS flare 30 each 0  . Blood Glucose Monitoring Suppl (ONETOUCH VERIO) w/Device KIT 1 Device by Does not apply route 2 (two) times daily as needed. 1 kit 0  . dapagliflozin propanediol (FARXIGA) 10 MG TABS tablet Take 10 mg by mouth daily. 90 tablet 3  . diclofenac (VOLTAREN) 75 MG EC tablet Take 1 tablet (75 mg total) by mouth 2 (two) times daily. 60 tablet 5  . diclofenac sodium (VOLTAREN) 1 % GEL Apply 2 g topically 4 (four) times daily. 200 g 5  . dicyclomine (BENTYL) 10 MG capsule TAKE 1 CAPSULE (10 MG TOTAL) BY MOUTH 4 (FOUR) TIMES DAILY - BEFORE MEALS AND AT BEDTIME. 90 capsule 3  . Erenumab-aooe (AIMOVIG) 140 MG/ML SOAJ Inject 140 mg into the skin every 30 (thirty) days. 1 pen 10  . gabapentin (NEURONTIN) 600 MG tablet Take 600 mg by  mouth as needed.     [name] ibuprofen (ADVIL,MOTRIN) 600 MG tablet TAKE 1 TABLET(600 MG) BY MOUTH EVERY 8 HOURS AS NEEDED 30 tablet 0  . losartan-hydrochlorothiazide (HYZAAR) 100-25 MG tablet Take 1 tablet by mouth daily. 90 tablet 3  . metFORMIN (GLUCOPHAGE) 1000 MG tablet Take 1 tablet (1,000 mg total) by mouth 2 (two) times daily with a meal. 180 tablet 3  . methocarbamol (ROBAXIN) 500 MG tablet TAKE 1 TABLET BY MOUTH TWICE DAILY (Patient taking differently: Take 500 mg by mouth every 6 (six) hours as needed. ) 20 tablet 0  . omeprazole (PRILOSEC) 40 MG capsule Take 1 capsule (40 mg total) by mouth daily as needed (indigestion). 90 capsule 1  . sildenafil (REVATIO) 20 MG tablet Take 20 mg by mouth. 3-5 tabs prn    . SUMAtriptan (IMITREX) 100 MG tablet TAKE 1 TABLET(100 MG) BY MOUTH 1 TIME FOR UP TO 1 DOSE AS NEEDED. MAY REPEAT IN 2 HOURS IF HEADACHE PERSISTS OR RECURS 10 tablet 9  . timolol (TIMOPTIC-XR) 0.5 % ophthalmic gel-forming Place 1 drop into both eyes daily.     . TRESIBA FLEXTOUCH 100 UNIT/ML SOPN FlexTouch Pen Inject 5 Units into the skin daily.     . verapamil (CALAN-SR) 180 MG CR tablet Take 1 tablet (180 mg total) by mouth daily. 90 tablet 1  . Vitamin D, Ergocalciferol, (DRISDOL) 1.25 MG (50000 UT) CAPS capsule Take 1 capsule (50,000 Units total) by mouth every 7 (seven) days. 12 capsule 0   No current facility-administered medications on file prior to visit.     Allergies  Allergen Reactions  . Pollen Extract Other (See Comments)    Stuffy nose    Family History  Problem Relation Age of Onset  . Arthritis Mother   . Stroke Mother   . Hypertension Father   . Diabetes Father   . Cancer Father        Multiple Myleoma  . Diabetes Maternal Grandfather   . Colon cancer Neg Hx   . Esophageal cancer Neg Hx   . Rectal cancer Neg Hx   . Stomach cancer Neg Hx     BP 136/84 (BP Location: Left Arm, Patient Position: Sitting, Cuff Size: Normal)   Pulse 70   Ht _0  (1.702 m)    Wt 212 lb 3.2 oz (96.3 kg)   SpO2 96%   BMI 33.24 kg/m   Review of Systems He denies hypoglycemia    Objective:   Physical Exam VITAL SIGNS:  See vs page GENERAL: no distress Pulses: dorsalis pedis intact bilat.   MSK: no deformity of the feet CV: no leg edema Skin:  no ulcer on the feet.  normal color and temp on the feet. Neuro: sensation is intact to touch on the feet  Lab Results  Component Value Date   HGBA1C 8.2 (A) 11/14/2018   Lab Results  Component Value Date   CREATININE 0.97 08/22/2018   BUN 15 08/22/2018   NA 141 08/22/2018   K 4.0 08/22/2018   CL 104 08/22/2018   CO2 29 08/22/2018       Assessment & Plan:  Insulin-requiring type 2 DM, with PN: worse.  He can still control DM without insulin.   Edema: better today, but he is not a candidate for pioglitazone.    Patient Instructions  I have sent a prescription to your pharmacy, to add "Ozempic."  If you have no nausea with this, you can double it.  Please continue the same other other diabetes medications. Please also try to go off the insulin. check your blood sugar twice a day.  vary the time of day when you check, between before the 3 meals, and at bedtime.  also check if you have symptoms of your blood sugar being too high or too low.  please keep a record of the readings and bring it to your next appointment here (or you can bring the meter itself).  You can write it on any piece of paper.  please call us sooner if your blood sugar goes below 70, or if you have a lot of readings over 200.  Please come back for a follow-up appointment in 2 months.

## 2018-11-15 NOTE — Progress Notes (Signed)
Subjective:  Patient ID: Jason Ortega, male    DOB: [birth year],  MRN: [id number]  Chief Complaint  Patient presents with  . Foot Pain    Pt states left sub 5th painful spot, 2 week duration, no known injuries. Pt states it may be a callous. Pt states right sub 5th has a similar issue, but no pain associated with it. 2-3 week duration.   53 y.o. male presents with the above complaint. Hx as above.  Review of Systems: Negative except as noted in the HPI. Denies N/V/F/Ch.  Past Medical History:  Diagnosis Date  . Allergy   . Anemia   . Arthritis   . Cataract    bilateral cataracts removed  . Depression   . Diabetes mellitus   . Frequent headaches   . GERD (gastroesophageal reflux disease)   . Glaucoma   . Hypercholesteremia   . Hypertension   . Migraines   . Sleep apnea    wears c-PAP    Current Outpatient Medications:  .  aspirin 81 MG chewable tablet, CHEW 1 TABLET (81 MG TOTAL) BY MOUTH DAILY., Disp: 36 tablet, Rfl: 0 .  atorvastatin (LIPITOR) 40 MG tablet, Take 1 tablet (40 mg total) by mouth daily., Disp: 90 tablet, Rfl: 1 .  baclofen (LIORESAL) 10 MG tablet, Take 1 tablet (10 mg total) by mouth 3 (three) times daily. Prn for IBS flare, Disp: 30 each, Rfl: 0 .  Blood Glucose Monitoring Suppl (ONETOUCH VERIO) w/Device KIT, 1 Device by Does not apply route 2 (two) times daily as needed., Disp: 1 kit, Rfl: 0 .  dapagliflozin propanediol (FARXIGA) 10 MG TABS tablet, Take 10 mg by mouth daily., Disp: 90 tablet, Rfl: 3 .  diclofenac (VOLTAREN) 75 MG EC tablet, Take 1 tablet (75 mg total) by mouth 2 (two) times daily., Disp: 60 tablet, Rfl: 5 .  diclofenac sodium (VOLTAREN) 1 % GEL, Apply 2 g topically 4 (four) times daily., Disp: 200 g, Rfl: 5 .  dicyclomine (BENTYL) 10 MG capsule, TAKE 1 CAPSULE (10 MG TOTAL) BY MOUTH 4 (FOUR) TIMES DAILY - BEFORE MEALS AND AT BEDTIME., Disp: 90 capsule, Rfl: 3 .  Erenumab-aooe (AIMOVIG) 140 MG/ML SOAJ, Inject 140 mg into the skin every 30  (thirty) days., Disp: 1 pen, Rfl: 10 .  ibuprofen (ADVIL,MOTRIN) 600 MG tablet, TAKE 1 TABLET(600 MG) BY MOUTH EVERY 8 HOURS AS NEEDED, Disp: 30 tablet, Rfl: 0 .  losartan-hydrochlorothiazide (HYZAAR) 100-25 MG tablet, Take 1 tablet by mouth daily., Disp: 90 tablet, Rfl: 3 .  metFORMIN (GLUCOPHAGE) 1000 MG tablet, Take 1 tablet (1,000 mg total) by mouth 2 (two) times daily with a meal., Disp: 180 tablet, Rfl: 3 .  methocarbamol (ROBAXIN) 500 MG tablet, TAKE 1 TABLET BY MOUTH TWICE DAILY (Patient taking differently: Take 500 mg by mouth every 6 (six) hours as needed. ), Disp: 20 tablet, Rfl: 0 .  omeprazole (PRILOSEC) 40 MG capsule, Take 1 capsule (40 mg total) by mouth daily as needed (indigestion)., Disp: 90 capsule, Rfl: 1 .  sildenafil (REVATIO) 20 MG tablet, Take 20 mg by mouth. 3-5 tabs prn, Disp: , Rfl:  .  SUMAtriptan (IMITREX) 100 MG tablet, TAKE 1 TABLET(100 MG) BY MOUTH 1 TIME FOR UP TO 1 DOSE AS NEEDED. MAY REPEAT IN 2 HOURS IF HEADACHE PERSISTS OR RECURS, Disp: 10 tablet, Rfl: 9 .  timolol (TIMOPTIC-XR) 0.5 % ophthalmic gel-forming, Place 1 drop into both eyes daily. , Disp: , Rfl:  .  TRESIBA FLEXTOUCH 100 UNIT/ML SOPN  FlexTouch Pen, Inject 5 Units into the skin daily. , Disp: , Rfl:  .  verapamil (CALAN-SR) 180 MG CR tablet, Take 1 tablet (180 mg total) by mouth daily., Disp: 90 tablet, Rfl: 1 .  Vitamin D, Ergocalciferol, (DRISDOL) 1.25 MG (50000 UT) CAPS capsule, Take 1 capsule (50,000 Units total) by mouth every 7 (seven) days., Disp: 12 capsule, Rfl: 0 .  gabapentin (NEURONTIN) 600 MG tablet, Take 600 mg by mouth as needed. , Disp: , Rfl:  .  Semaglutide,0.25 or 0.'5MG'$ /DOS, (OZEMPIC, 0.25 OR 0.5 MG/DOSE,) 2 MG/1.5ML SOPN, Inject 0.25 mg into the skin once a week., Disp: 1 pen, Rfl: 11  Social History   Tobacco Use  Smoking Status Never Smoker  Smokeless Tobacco Never Used    Allergies  Allergen Reactions  . Pollen Extract Other (See Comments)    Stuffy nose   Objective:    Vitals:   10/24/18 0846  Temp: 98.1 F (36.7 C)   There is no height or weight on file to calculate BMI. Constitutional Well developed. Well nourished.  Vascular Dorsalis pedis pulses palpable bilaterally. Posterior tibial pulses palpable bilaterally. Capillary refill normal to all digits.  No cyanosis or clubbing noted. Pedal hair growth normal.  Neurologic Normal speech. Oriented to person, place, and time. Epicritic sensation to light touch grossly present bilaterally.  Dermatologic Nails Normal Skin hyperkeratosis sub-met 5 left, right  Orthopedic: Normal joint ROM without pain or crepitus bilaterally. No visible deformities. No bony tenderness.   Radiographs: None Assessment:   1. Capsulitis of metatarsophalangeal (MTP) joints of both feet   2. Porokeratosis    Plan:  Patient was evaluated and treated and all questions answered.  Capsulitis bilateral fifth MPJ with porokeratosis -Educated on etiology -Courtesy paring of lesions -Educated on self-care  Return if symptoms worsen or fail to improve.

## 2018-11-20 NOTE — Progress Notes (Signed)
[facility] [place], [place]te[place] [address] Phone: [phone] Subjective:   I Jason Ortega am serving as a Education administrator for Dr. Hulan Ortega.  I'm seeing this patient by the request  of:    CC: Right knee pain.  [id number]  Jason Ortega is a 53 y.o. male coming in with complaint of right knee pain. Wears bilateral compression sleeves. Pain is constant when he does experience pain.   Onset- Chronic  Location - Joint line  Duration-  Character- sharp, dull, achy, sore  Aggravating factors-  Reliving factors-  Therapies tried- Voltaren gel  Severity-  7/10 at its worse      Past Medical History:  Diagnosis Date  . Allergy   . Anemia   . Arthritis   . Cataract    bilateral cataracts removed  . Depression   . Diabetes mellitus   . Frequent headaches   . GERD (gastroesophageal reflux disease)   . Glaucoma   . Hypercholesteremia   . Hypertension   . Migraines   . Sleep apnea    wears c-PAP   Past Surgical History:  Procedure Laterality Date  . bilateral cataracts removed    . CARDIAC CATHETERIZATION  12/2011  . COLONOSCOPY    . LEFT HEART CATHETERIZATION WITH CORONARY ANGIOGRAM N/A 11/12/2011   Procedure: LEFT HEART CATHETERIZATION WITH CORONARY ANGIOGRAM;  Surgeon: Jason Page, MD;  Location: [facility] CATH LAB;  Service: Cardiovascular;  Laterality: N/A;  . SHOULDER SURGERY     Social History   Socioeconomic History  . Marital status: Married    Spouse name: Not on file  . Number of children: 2  . Years of education: 23  . Highest education level: Not on file  Occupational History  . Occupation: Clinical research associate  Social Needs  . Financial resource strain: Not on file  . Food insecurity    Worry: Not on file    Inability: Not on file  . Transportation needs    Medical: Not on file    Non-medical: Not on file  Tobacco Use  . Smoking status: Never Smoker  . Smokeless tobacco: Never Used  Substance and Sexual Activity  .  Alcohol use: Yes    Comment: occasional  . Drug use: No  . Sexual activity: Yes    Birth control/protection: None  Lifestyle  . Physical activity    Days per week: Not on file    Minutes per session: Not on file  . Stress: Not on file  Relationships  . Social Herbalist on phone: Not on file    Gets together: Not on file    Attends religious service: Not on file    Active member of club or organization: Not on file    Attends meetings of clubs or organizations: Not on file    Relationship status: Not on file  Other Topics Concern  . Not on file  Social History Narrative   Fun/Hobby: Coach football   Allergies  Allergen Reactions  . Pollen Extract Other (See Comments)    Stuffy nose   Family History  Problem Relation Age of Onset  . Arthritis Mother   . Stroke Mother   . Hypertension Father   . Diabetes Father   . Cancer Father        Multiple Myleoma  . Diabetes Maternal Grandfather   . Colon cancer Neg Hx   . Esophageal cancer Neg Hx   . Rectal cancer Neg  Hx   . Stomach cancer Neg Hx     Current Outpatient Medications (Endocrine & Metabolic):  .  dapagliflozin propanediol (FARXIGA) 10 MG TABS tablet, Take 10 mg by mouth daily. .  metFORMIN (GLUCOPHAGE) 1000 MG tablet, Take 1 tablet (1,000 mg total) by mouth 2 (two) times daily with a meal. .  Semaglutide,0.25 or 0.'5MG'$ /DOS, (OZEMPIC, 0.25 OR 0.5 MG/DOSE,) 2 MG/1.5ML SOPN, Inject 0.25 mg into the skin once a week. Tyler Aas FLEXTOUCH 100 UNIT/ML SOPN FlexTouch Pen, Inject 5 Units into the skin daily.   Current Outpatient Medications (Cardiovascular):  .  atorvastatin (LIPITOR) 40 MG tablet, Take 1 tablet (40 mg total) by mouth daily. [name]  losartan-hydrochlorothiazide (HYZAAR) 100-25 MG tablet, Take 1 tablet by mouth daily. .  sildenafil (REVATIO) 20 MG tablet, Take 20 mg by mouth. 3-5 tabs prn .  verapamil (CALAN-SR) 180 MG CR tablet, Take 1 tablet (180 mg total) by mouth daily.   Current Outpatient  Medications (Analgesics):  .  aspirin 81 MG chewable tablet, CHEW 1 TABLET (81 MG TOTAL) BY MOUTH DAILY. [name]  diclofenac (VOLTAREN) 75 MG EC tablet, Take 1 tablet (75 mg total) by mouth 2 (two) times daily. Eduard Roux (AIMOVIG) 140 MG/ML SOAJ, Inject 140 mg into the skin every 30 (thirty) days. [name]  ibuprofen (ADVIL,MOTRIN) 600 MG tablet, TAKE 1 TABLET(600 MG) BY MOUTH EVERY 8 HOURS AS NEEDED .  SUMAtriptan (IMITREX) 100 MG tablet, TAKE 1 TABLET(100 MG) BY MOUTH 1 TIME FOR UP TO 1 DOSE AS NEEDED. MAY REPEAT IN 2 HOURS IF HEADACHE PERSISTS OR RECURS   Current Outpatient Medications (Other):  .  baclofen (LIORESAL) 10 MG tablet, Take 1 tablet (10 mg total) by mouth 3 (three) times daily. Prn for IBS flare .  Blood Glucose Monitoring Suppl (ONETOUCH VERIO) w/Device KIT, 1 Device by Does not apply route 2 (two) times daily as needed. .  diclofenac sodium (VOLTAREN) 1 % GEL, Apply 2 g topically 4 (four) times daily. [name]  dicyclomine (BENTYL) 10 MG capsule, TAKE 1 CAPSULE (10 MG TOTAL) BY MOUTH 4 (FOUR) TIMES DAILY - BEFORE MEALS AND AT BEDTIME. [name]  gabapentin (NEURONTIN) 600 MG tablet, Take 600 mg by mouth as needed.  .  methocarbamol (ROBAXIN) 500 MG tablet, TAKE 1 TABLET BY MOUTH TWICE DAILY (Patient taking differently: Take 500 mg by mouth every 6 (six) hours as needed. ) .  omeprazole (PRILOSEC) 40 MG capsule, Take 1 capsule (40 mg total) by mouth daily as needed (indigestion). .  timolol (TIMOPTIC-XR) 0.5 % ophthalmic gel-forming, Place 1 drop into both eyes daily.  .  Vitamin D, Ergocalciferol, (DRISDOL) 1.25 MG (50000 UT) CAPS capsule, Take 1 capsule (50,000 Units total) by mouth every 7 (seven) days.    Past medical history, social, surgical and family history all reviewed in electronic medical record.  No pertanent information unless stated regarding to the chief complaint.   Review of Systems:  No headache, visual changes, nausea, vomiting, diarrhea, constipation, dizziness, abdominal  pain, skin rash, fevers, chills, night sweats, weight loss, swollen lymph nodes, body aches, joint swelling, muscle aches, chest pain, shortness of breath, mood changes.   Objective  There were no vitals taken for this visit. Systems examined below as of    General: No apparent distress alert and oriented x3 mood and affect normal, dressed appropriately.  HEENT: Pupils equal, extraocular movements intact  Respiratory: Patient's speak in full sentences and does not appear short of breath  Cardiovascular: No lower extremity  edema, non tender, no erythema  Skin: Warm dry intact with no signs of infection or rash on extremities or on axial skeleton.  Abdomen: Soft nontender  Neuro: Cranial nerves II through XII are intact, neurovascularly intact in all extremities with 2+ DTRs and 2+ pulses.  Lymph: No lymphadenopathy of posterior or anterior cervical chain or axillae bilaterally.  Gait normal with good balance and coordination.  MSK:  Non tender with full range of motion and good stability and symmetric strength and tone of shoulders, elbows, wrist, hip, and ankles bilaterally.  Right knee exam shows the patient does have a positive grind test noted.  Patella alta is noted.  Tenderness to palpation around the patella itself but no true swelling.  Near full range of motion.  No significant instability though noted.  Limited musculoskeletal ultrasound was performed of the back Lyndal Pulley  Limited musculoskeletal ultrasound showed the patient does have some calcification of the quadricep as well as the patella tendon.  No true tearing noted.  No increase in Doppler flow.  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 The above documentation has been reviewed and is accurate and complete  Lyndal Pulley, DO       Note: This dictation was prepared with Dragon dictation along with smaller phrase technology. Any transcriptional errors that result from this process are unintentional.

## 2018-11-21 ENCOUNTER — Other Ambulatory Visit: Payer: Self-pay

## 2018-11-21 ENCOUNTER — Ambulatory Visit (INDEPENDENT_AMBULATORY_CARE_PROVIDER_SITE_OTHER): Payer: 59 | Admitting: Family Medicine

## 2018-11-21 ENCOUNTER — Ambulatory Visit: Payer: Self-pay

## 2018-11-21 ENCOUNTER — Encounter: Payer: Self-pay | Admitting: Family Medicine

## 2018-11-21 VITALS — BP 142/90 | HR 79 | Ht 67.0 in | Wt 212.0 lb

## 2018-11-21 DIAGNOSIS — M94261 Chondromalacia, right knee: Secondary | ICD-10-CM

## 2018-11-21 DIAGNOSIS — M25561 Pain in right knee: Secondary | ICD-10-CM | POA: Diagnosis not present

## 2018-11-21 DIAGNOSIS — G8929 Other chronic pain: Secondary | ICD-10-CM

## 2018-11-21 NOTE — Assessment & Plan Note (Signed)
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  We discussed with patient in great length.  Patient given a Tru pull lite, home exercise, icing regimen, topical anti-inflammatories.  4 weeks of reduced hours at work.  Patient will follow-up with me again in 4 weeks and at that time if continued to have pain we will consider injection.

## 2018-11-21 NOTE — Patient Instructions (Addendum)
NIce to meet you Pennsaid 2x a day finger tip sized amount Vitamin D 2000IU daily Ice 20 min 2x a day See me in 4 weeks

## 2018-12-03 ENCOUNTER — Encounter: Payer: Self-pay | Admitting: Family Medicine

## 2018-12-03 ENCOUNTER — Telehealth: Payer: Self-pay

## 2018-12-03 ENCOUNTER — Other Ambulatory Visit: Payer: Self-pay

## 2018-12-03 MED ORDER — PENNSAID 2 % TD SOLN: 2.0000 g | Freq: Two times a day (BID) | TRANSDERMAL | 3 refills | Status: DC

## 2018-12-03 NOTE — Telephone Encounter (Signed)
Filled pennsaid prescription and gave patient number to contact pharmacy

## 2018-12-08 NOTE — Telephone Encounter (Signed)
Aimovig 140 mg continuation PA completed on CMM. Key: [id number]. Awaiting [facility] determination.

## 2018-12-08 NOTE — Telephone Encounter (Signed)
Per [facility], Aimovig 140 mg was approved from 12/08/2018-12/08/2019. I faxed this to the pharmacy. Received a receipt of confirmation.

## 2018-12-29 NOTE — Progress Notes (Deleted)
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  We discussed with patient in great length.  Patient given a Tru pull lite, home exercise, icing regimen, topical anti-inflammatories.  4 weeks of reduced hours at work.  Patient will follow-up with me again in 4 weeks and at that time if continued to have pain we will consider injection.  Update 12/30/2018 Jason Ortega is a 53 y.o. male coming in with complaint of right knee pain. Patient was given a brace at last visit. Patient states      Past Medical History:  Diagnosis Date  . Allergy   . Anemia   . Arthritis   . Cataract    bilateral cataracts removed  . Depression   . Diabetes mellitus   . Frequent headaches   . GERD (gastroesophageal reflux disease)   . Glaucoma   . Hypercholesteremia   . Hypertension   . Migraines   . Sleep apnea    wears c-PAP   Past Surgical History:  Procedure Laterality Date  . bilateral cataracts removed    . CARDIAC CATHETERIZATION  12/2011  . COLONOSCOPY    . LEFT HEART CATHETERIZATION WITH CORONARY ANGIOGRAM N/A 11/12/2011   Procedure: LEFT HEART CATHETERIZATION WITH CORONARY ANGIOGRAM;  Surgeon: Jason Page, MD;  Location: [facility] CATH LAB;  Service: Cardiovascular;  Laterality: N/A;  . SHOULDER SURGERY     Social History   Socioeconomic History  . Marital status: Married    Spouse name: Not on file  . Number of children: 2  . Years of education: 41  . Highest education level: Not on file  Occupational History  . Occupation: Clinical research associate  Social Needs  . Financial resource strain: Not on file  . Food insecurity    Worry: Not on file    Inability: Not on file  . Transportation needs    Medical: Not on file    Non-medical: Not on  file  Tobacco Use  . Smoking status: Never Smoker  . Smokeless tobacco: Never Used  Substance and Sexual Activity  . Alcohol use: Yes    Comment: occasional  . Drug use: No  . Sexual activity: Yes    Birth control/protection: None  Lifestyle  . Physical activity    Days per week: Not on file    Minutes per session: Not on file  . Stress: Not on file  Relationships  . Social Herbalist on phone: Not on file    Gets together: Not on file    Attends religious service: Not on file    Active member of club or organization: Not on file    Attends meetings of clubs or organizations: Not on file    Relationship status: Not on file  Other Topics Concern  . Not on file  Social History Narrative   Fun/Hobby: Coach football   Allergies  Allergen Reactions  . Pollen Extract Other (See Comments)    Stuffy nose   Family History  Problem Relation Age of Onset  . Arthritis Mother   . Stroke Mother   . Hypertension Father   . Diabetes Father   . Cancer Father        Multiple Myleoma  . Diabetes  Maternal Grandfather   . Colon cancer Neg Hx   . Esophageal cancer Neg Hx   . Rectal cancer Neg Hx   . Stomach cancer Neg Hx     Current Outpatient Medications (Endocrine & Metabolic):  .  dapagliflozin propanediol (FARXIGA) 10 MG TABS tablet, Take 10 mg by mouth daily. .  metFORMIN (GLUCOPHAGE) 1000 MG tablet, Take 1 tablet (1,000 mg total) by mouth 2 (two) times daily with a meal. .  Semaglutide,0.25 or 0.'5MG'$ /DOS, (OZEMPIC, 0.25 OR 0.5 MG/DOSE,) 2 MG/1.5ML SOPN, Inject 0.25 mg into the skin once a week. Tyler Aas FLEXTOUCH 100 UNIT/ML SOPN FlexTouch Pen, Inject 5 Units into the skin daily.   Current Outpatient Medications (Cardiovascular):  .  atorvastatin (LIPITOR) 40 MG tablet, Take 1 tablet (40 mg total) by mouth daily. [name]  losartan-hydrochlorothiazide (HYZAAR) 100-25 MG tablet, Take 1 tablet by mouth daily. .  sildenafil (REVATIO) 20 MG tablet, Take 20 mg by mouth. 3-5  tabs prn .  verapamil (CALAN-SR) 180 MG CR tablet, Take 1 tablet (180 mg total) by mouth daily.   Current Outpatient Medications (Analgesics):  .  aspirin 81 MG chewable tablet, CHEW 1 TABLET (81 MG TOTAL) BY MOUTH DAILY. [name]  diclofenac (VOLTAREN) 75 MG EC tablet, Take 1 tablet (75 mg total) by mouth 2 (two) times daily. Jason Ortega (AIMOVIG) 140 MG/ML SOAJ, Inject 140 mg into the skin every 30 (thirty) days. [name]  ibuprofen (ADVIL,MOTRIN) 600 MG tablet, TAKE 1 TABLET(600 MG) BY MOUTH EVERY 8 HOURS AS NEEDED .  SUMAtriptan (IMITREX) 100 MG tablet, TAKE 1 TABLET(100 MG) BY MOUTH 1 TIME FOR UP TO 1 DOSE AS NEEDED. MAY REPEAT IN 2 HOURS IF HEADACHE PERSISTS OR RECURS   Current Outpatient Medications (Other):  .  baclofen (LIORESAL) 10 MG tablet, Take 1 tablet (10 mg total) by mouth 3 (three) times daily. Prn for IBS flare .  Blood Glucose Monitoring Suppl (ONETOUCH VERIO) w/Device KIT, 1 Device by Does not apply route 2 (two) times daily as needed. .  Diclofenac Sodium (PENNSAID) 2 % SOLN, Place 2 g onto the skin 2 (two) times daily. .  diclofenac sodium (VOLTAREN) 1 % GEL, Apply 2 g topically 4 (four) times daily. [name]  dicyclomine (BENTYL) 10 MG capsule, TAKE 1 CAPSULE (10 MG TOTAL) BY MOUTH 4 (FOUR) TIMES DAILY - BEFORE MEALS AND AT BEDTIME. [name]  gabapentin (NEURONTIN) 600 MG tablet, Take 600 mg by mouth as needed.  .  methocarbamol (ROBAXIN) 500 MG tablet, TAKE 1 TABLET BY MOUTH TWICE DAILY (Patient taking differently: Take 500 mg by mouth every 6 (six) hours as needed. ) .  omeprazole (PRILOSEC) 40 MG capsule, Take 1 capsule (40 mg total) by mouth daily as needed (indigestion). .  timolol (TIMOPTIC-XR) 0.5 % ophthalmic gel-forming, Place 1 drop into both eyes daily.  .  Vitamin D, Ergocalciferol, (DRISDOL) 1.25 MG (50000 UT) CAPS capsule, Take 1 capsule (50,000 Units total) by mouth every 7 (seven) days.    Past medical history, social, surgical and family history all reviewed in electronic  medical record.  No pertanent information unless stated regarding to the chief complaint.   Review of Systems:  No headache, visual changes, nausea, vomiting, diarrhea, constipation, dizziness, abdominal pain, skin rash, fevers, chills, night sweats, weight loss, swollen lymph nodes, body aches, joint swelling, muscle aches, chest pain, shortness of breath, mood changes.   Objective  There were no vitals taken for this visit.    General: No apparent distress alert  and oriented x3 mood and affect normal, dressed appropriately.  HEENT: Pupils equal, extraocular movements intact  Respiratory: Patient's speak in full sentences and does not appear short of breath  Cardiovascular: No lower extremity edema, non tender, no erythema  Skin: Warm dry intact with no signs of infection or rash on extremities or on axial skeleton.  Abdomen: Soft nontender  Neuro: Cranial nerves II through XII are intact, neurovascularly intact in all extremities with 2+ DTRs and 2+ pulses.  Lymph: No lymphadenopathy of posterior or anterior cervical chain or axillae bilaterally.  Gait normal with good balance and coordination.  MSK:  Non tender with full range of motion and good stability and symmetric strength and tone of shoulders, elbows, wrist, hip and ankles bilaterally.  Knee: Normal to inspection with no erythema or effusion or obvious bony abnormalities. Palpation normal with no warmth, joint line tenderness, patellar tenderness, or condyle tenderness. ROM full in flexion and extension and lower leg rotation. Ligaments with solid consistent endpoints including ACL, PCL, LCL, MCL. Negative Mcmurray's, Apley's, and Thessalonian tests. Non painful patellar compression. Patellar glide without crepitus. Patellar and quadriceps tendons unremarkable. Hamstring and quadriceps strength is normal.   Impression and Recommendations:     This case required medical decision making of moderate complexity. The above  documentation has been reviewed and is accurate and complete Jason Pulley, DO       Note: This dictation was prepared with Dragon dictation along with smaller phrase technology. Any transcriptional errors that result from this process are unintentional.

## 2018-12-30 ENCOUNTER — Ambulatory Visit: Payer: 59 | Admitting: Family Medicine

## 2018-12-31 ENCOUNTER — Ambulatory Visit: Payer: 59 | Admitting: Internal Medicine

## 2018-12-31 ENCOUNTER — Other Ambulatory Visit: Payer: Self-pay

## 2018-12-31 ENCOUNTER — Encounter: Payer: Self-pay | Admitting: Internal Medicine

## 2018-12-31 ENCOUNTER — Ambulatory Visit (INDEPENDENT_AMBULATORY_CARE_PROVIDER_SITE_OTHER): Payer: 59 | Admitting: Internal Medicine

## 2018-12-31 ENCOUNTER — Other Ambulatory Visit: Payer: Self-pay | Admitting: Internal Medicine

## 2018-12-31 VITALS — BP 114/72 | HR 92 | Temp 98.2°F | Ht 67.0 in | Wt 213.0 lb

## 2018-12-31 DIAGNOSIS — I10 Essential (primary) hypertension: Secondary | ICD-10-CM | POA: Diagnosis not present

## 2018-12-31 DIAGNOSIS — M25561 Pain in right knee: Secondary | ICD-10-CM

## 2018-12-31 DIAGNOSIS — E119 Type 2 diabetes mellitus without complications: Secondary | ICD-10-CM | POA: Diagnosis not present

## 2018-12-31 DIAGNOSIS — G8929 Other chronic pain: Secondary | ICD-10-CM | POA: Diagnosis not present

## 2018-12-31 MED ORDER — MELOXICAM 15 MG PO TABS: 15.0000 mg | ORAL_TABLET | Freq: Every day | ORAL | 5 refills | Status: DC

## 2018-12-31 MED ORDER — TRAMADOL HCL 50 MG PO TABS: 50.0000 mg | ORAL_TABLET | Freq: Four times a day (QID) | ORAL | 0 refills | Status: DC | PRN

## 2018-12-31 NOTE — Patient Instructions (Signed)
OK to continue pennsaid and the brace  Please take all new medication as prescribed - the mobic as needed for inflammation, and the tramadol for pain as needed  You will be contacted regarding the referral for: [employer] for work note for [employer] and Friday this week.  Please consider followup with Dr Tamala Julian if the pain continues  Please continue all other medications as before, and refills have been done if requested.  Please have the pharmacy call with any other refills you may need.  Please keep your appointments with your specialists as you may have planned

## 2019-01-02 ENCOUNTER — Encounter: Payer: Self-pay | Admitting: Internal Medicine

## 2019-01-02 NOTE — Assessment & Plan Note (Signed)
stable overall by history and exam, recent data reviewed with pt, and pt to continue medical treatment as before,  to f/u any worsening symptoms or concerns  

## 2019-01-02 NOTE — Progress Notes (Signed)
Subjective:    Patient ID: Jason Ortega, male    DOB: 1966-01-15, 53 y.o.   MRN: [id number]  HPI  Here to f/u right knee pain, now chronic after initial injury it seems when there was a torque force to the right knee with subsequent pain to the posterolateral knee on sept 15, 2020; was seen soon after per sports medicine Dr Tamala Julian with "tight ligaments" per pt and tx with brace, home exercise, icing regimen and topical pennsaid, with recommendation for f/u at 4 wks to consider cortisone injection;  Has some initial improvement, then worsening again, now constant, moderate, and worse with the all day recurring leg flexion/extension movement in a very small arc 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  Also worse to walk, and even step up on a stair.  Overall feels like it might giveaway but has not so far and no falls or other injury.  Pt denies chest pain, increased sob or doe, wheezing, orthopnea, PND, increased LE swelling, palpitations, dizziness or syncope.   Pt denies polydipsia, polyuria Past Medical History:  Diagnosis Date  . Allergy   . Anemia   . Arthritis   . Cataract    bilateral cataracts removed  . Depression   . Diabetes mellitus   . Frequent headaches   . GERD (gastroesophageal reflux disease)   . Glaucoma   . Hypercholesteremia   . Hypertension   . Migraines   . Sleep apnea    wears c-PAP   Past Surgical History:  Procedure Laterality Date  . bilateral cataracts removed    . CARDIAC CATHETERIZATION  12/2011  . COLONOSCOPY    . LEFT HEART CATHETERIZATION WITH CORONARY ANGIOGRAM N/A 11/12/2011   Procedure: LEFT HEART CATHETERIZATION WITH CORONARY ANGIOGRAM;  Surgeon: Laverda Page, MD;  Location: [facility] CATH LAB;  Service: Cardiovascular;  Laterality: N/A;  . SHOULDER SURGERY      reports that he has never smoked. He has never used smokeless tobacco. He reports current alcohol use. He reports that he does not use drugs. family history includes  Arthritis in his mother; Cancer in his father; Diabetes in his father and maternal grandfather; Hypertension in his father; Stroke in his mother. Allergies  Allergen Reactions  . Pollen Extract Other (See Comments)    Stuffy nose   Current Outpatient Medications on File Prior to Visit  Medication Sig Dispense Refill  . aspirin 81 MG chewable tablet CHEW 1 TABLET (81 MG TOTAL) BY MOUTH DAILY. 36 tablet 0  . atorvastatin (LIPITOR) 40 MG tablet Take 1 tablet (40 mg total) by mouth daily. 90 tablet 1  . baclofen (LIORESAL) 10 MG tablet Take 1 tablet (10 mg total) by mouth 3 (three) times daily. Prn for IBS flare 30 each 0  . Blood Glucose Monitoring Suppl (ONETOUCH VERIO) w/Device KIT 1 Device by Does not apply route 2 (two) times daily as needed. 1 kit 0  . dapagliflozin propanediol (FARXIGA) 10 MG TABS tablet Take 10 mg by mouth daily. 90 tablet 3  . diclofenac (VOLTAREN) 75 MG EC tablet Take 1 tablet (75 mg total) by mouth 2 (two) times daily. 60 tablet 5  . Diclofenac Sodium (PENNSAID) 2 % SOLN Place 2 g onto the skin 2 (two) times daily. 112 g 3  . diclofenac sodium (VOLTAREN) 1 % GEL Apply 2 g topically 4 (four) times daily. 200 g 5  . dicyclomine (BENTYL) 10 MG capsule TAKE 1 CAPSULE (10 MG TOTAL) BY  MOUTH 4 (FOUR) TIMES DAILY - BEFORE MEALS AND AT BEDTIME. 90 capsule 3  . Erenumab-aooe (AIMOVIG) 140 MG/ML SOAJ Inject 140 mg into the skin every 30 (thirty) days. 1 pen 10  . gabapentin (NEURONTIN) 600 MG tablet Take 600 mg by mouth as needed.     [name] ibuprofen (ADVIL,MOTRIN) 600 MG tablet TAKE 1 TABLET(600 MG) BY MOUTH EVERY 8 HOURS AS NEEDED 30 tablet 0  . losartan-hydrochlorothiazide (HYZAAR) 100-25 MG tablet Take 1 tablet by mouth daily. 90 tablet 3  . metFORMIN (GLUCOPHAGE) 1000 MG tablet Take 1 tablet (1,000 mg total) by mouth 2 (two) times daily with a meal. 180 tablet 3  . methocarbamol (ROBAXIN) 500 MG tablet TAKE 1 TABLET BY MOUTH TWICE DAILY (Patient taking differently: Take 500 mg by  mouth every 6 (six) hours as needed. ) 20 tablet 0  . omeprazole (PRILOSEC) 40 MG capsule Take 1 capsule (40 mg total) by mouth daily as needed (indigestion). 90 capsule 1  . Semaglutide,0.25 or 0.'5MG'$ /DOS, (OZEMPIC, 0.25 OR 0.5 MG/DOSE,) 2 MG/1.5ML SOPN Inject 0.25 mg into the skin once a week. 1 pen 11  . sildenafil (REVATIO) 20 MG tablet Take 20 mg by mouth. 3-5 tabs prn    . SUMAtriptan (IMITREX) 100 MG tablet TAKE 1 TABLET(100 MG) BY MOUTH 1 TIME FOR UP TO 1 DOSE AS NEEDED. MAY REPEAT IN 2 HOURS IF HEADACHE PERSISTS OR RECURS 10 tablet 9  . timolol (TIMOPTIC-XR) 0.5 % ophthalmic gel-forming Place 1 drop into both eyes daily.     . TRESIBA FLEXTOUCH 100 UNIT/ML SOPN FlexTouch Pen Inject 5 Units into the skin daily.     . verapamil (CALAN-SR) 180 MG CR tablet Take 1 tablet (180 mg total) by mouth daily. 90 tablet 1  . Vitamin D, Ergocalciferol, (DRISDOL) 1.25 MG (50000 UT) CAPS capsule Take 1 capsule (50,000 Units total) by mouth every 7 (seven) days. 12 capsule 0   No current facility-administered medications on file prior to visit.    Review of Systems  Constitutional: Negative for other unusual diaphoresis or sweats HENT: Negative for ear discharge or swelling Eyes: Negative for other worsening visual disturbances Respiratory: Negative for stridor or other swelling  Gastrointestinal: Negative for worsening distension or other blood Genitourinary: Negative for retention or other urinary change Musculoskeletal: Negative for other MSK pain or swelling Skin: Negative for color change or other new lesions Neurological: Negative for worsening tremors and other numbness  Psychiatric/Behavioral: Negative for worsening agitation or other fatigue All otherwise neg per pt    Objective:   Physical Exam BP 114/72   Pulse 92   Temp 98.2 F (36.8 C) (Oral)   Ht '5\' 7"'$  (1.702 m)   Wt 213 lb (96.6 kg)   SpO2 97%   BMI 33.36 kg/m  VS noted,  Constitutional: Pt appears in NAD HENT: Head: NCAT.   Right Ear: External ear normal.  Left Ear: External ear normal.  Eyes: . Pupils are equal, round, and reactive to light. Conjunctivae and EOM are normal Nose: without d/c or deformity Neck: Neck supple. Gross normal ROM Cardiovascular: Normal rate and regular rhythm.   Pulmonary/Chest: Effort normal and breath sounds without rales or wheezing.  Abd:  Soft, NT, ND, + BS, no organomegaly Right knee with reduced ROM and tender posterlat gastroc tendon insertion site Neurological: Pt is alert. At baseline orientation, motor grossly intact Skin: Skin is warm. No rashes, other new lesions, no LE edema Psychiatric: Pt behavior is normal without agitation  All  otherwise neg per pt        Assessment & Plan:

## 2019-01-02 NOTE — Assessment & Plan Note (Signed)
Suspect marked tendinitis, for brace, pennsaid to continue, off work x next 2 work days thur and Friday, tramadol prn, mobic 15 qd, refer PT, and pt to f/u with Dr Tamala Julian for consider cortisone

## 2019-01-16 ENCOUNTER — Ambulatory Visit: Payer: 59 | Admitting: Physical Therapy

## 2019-01-19 ENCOUNTER — Encounter: Payer: Self-pay | Admitting: *Deleted

## 2019-01-21 ENCOUNTER — Ambulatory Visit (INDEPENDENT_AMBULATORY_CARE_PROVIDER_SITE_OTHER): Payer: 59 | Admitting: Family Medicine

## 2019-01-21 ENCOUNTER — Other Ambulatory Visit: Payer: Self-pay | Admitting: Internal Medicine

## 2019-01-21 ENCOUNTER — Other Ambulatory Visit: Payer: Self-pay

## 2019-01-21 ENCOUNTER — Encounter: Payer: Self-pay | Admitting: Family Medicine

## 2019-01-21 DIAGNOSIS — M94261 Chondromalacia, right knee: Secondary | ICD-10-CM | POA: Diagnosis not present

## 2019-01-21 NOTE — Progress Notes (Signed)
[name] [address], [place] [address] Phone: [phone] Subjective:   I Jason Ortega am serving as a Education administrator for Dr. Hulan Saas.  This visit occurred during the SARS-CoV-2 public health emergency.  Safety protocols were in place, including screening questions prior to the visit, additional usage of staff PPE, and extensive cleaning of exam room while observing appropriate contact time as indicated for disinfecting solutions.    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  We discussed with patient in great length.  Patient given a Tru pull lite, home exercise, icing regimen, topical anti-inflammatories.  4 weeks of reduced hours at work.  Patient will follow-up with me again in 4 weeks and at that time if continued to have pain we will consider injection.  01/21/2019 Jason Ortega is a 53 y.o. male coming in with complaint of right knee pain. Patient states he reinjured the knee about 3 weeks ago. States the knee is painful.  Patient states again this seems to be different.  Having pain more in the lateral and posterior aspect of the knee.  Not as much over the kneecap itself.  Still has some mild grinding.  Been wearing the brace but not doing the exercises regularly.     Past Medical History:  Diagnosis Date  . Allergy   . Anemia   . Arthritis   . Cataract    bilateral cataracts removed  . Depression   . Diabetes mellitus   . Frequent headaches   . GERD (gastroesophageal reflux disease)   . Glaucoma   . Hypercholesteremia   . Hypertension   . Migraines   . Sleep apnea    wears c-PAP   Past Surgical History:  Procedure Laterality Date  . bilateral cataracts removed    . CARDIAC CATHETERIZATION  12/2011  . COLONOSCOPY    . LEFT HEART CATHETERIZATION WITH CORONARY ANGIOGRAM N/A 11/12/2011   Procedure: LEFT HEART CATHETERIZATION WITH  CORONARY ANGIOGRAM;  Surgeon: Laverda Page, MD;  Location: [facility] CATH LAB;  Service: Cardiovascular;  Laterality: N/A;  . SHOULDER SURGERY     Social History   Socioeconomic History  . Marital status: Married    Spouse name: Not on file  . Number of children: 2  . Years of education: 76  . Highest education level: Not on file  Occupational History  . Occupation: Stocker  Tobacco Use  . Smoking status: Never Smoker  . Smokeless tobacco: Never Used  Substance and Sexual Activity  . Alcohol use: Yes    Comment: occasional  . Drug use: No  . Sexual activity: Yes    Birth control/protection: None  Other Topics Concern  . Not on file  Social History Narrative   Fun/Hobby: Leisure centre manager football   Social Determinants of Health   Financial Resource Strain:   . Difficulty of Paying Living Expenses: Not on file  Food Insecurity:   . Worried About Charity fundraiser in the Last Year: Not on file  . Ran Out of Food in the Last Year: Not on file  Transportation Needs:   . Lack of Transportation (Medical): Not on file  . Lack of Transportation (Non-Medical): Not on file  Physical Activity:   . Days of Exercise per Week: Not on file  . Minutes of Exercise per Session: Not on file  Stress:   . Feeling of Stress :  Not on file  Social Connections:   . Frequency of Communication with Friends and Family: Not on file  . Frequency of Social Gatherings with Friends and Family: Not on file  . Attends Religious Services: Not on file  . Active Member of Clubs or Organizations: Not on file  . Attends Archivist Meetings: Not on file  . Marital Status: Not on file   Allergies  Allergen Reactions  . Pollen Extract Other (See Comments)    Stuffy nose   Family History  Problem Relation Age of Onset  . Arthritis Mother   . Stroke Mother   . Hypertension Father   . Diabetes Father   . Cancer Father        Multiple Myleoma  . Diabetes Maternal Grandfather   . Colon cancer Neg Hx     . Esophageal cancer Neg Hx   . Rectal cancer Neg Hx   . Stomach cancer Neg Hx     Current Outpatient Medications (Endocrine & Metabolic):  .  dapagliflozin propanediol (FARXIGA) 10 MG TABS tablet, Take 10 mg by mouth daily. .  metFORMIN (GLUCOPHAGE) 1000 MG tablet, Take 1 tablet (1,000 mg total) by mouth 2 (two) times daily with a meal. .  Semaglutide,0.25 or 0.'5MG'$ /DOS, (OZEMPIC, 0.25 OR 0.5 MG/DOSE,) 2 MG/1.5ML SOPN, Inject 0.25 mg into the skin once a week. Tyler Aas FLEXTOUCH 100 UNIT/ML SOPN FlexTouch Pen, Inject 5 Units into the skin daily.   Current Outpatient Medications (Cardiovascular):  .  atorvastatin (LIPITOR) 40 MG tablet, TAKE 1 TABLET BY MOUTH EVERY DAY .  losartan-hydrochlorothiazide (HYZAAR) 100-25 MG tablet, Take 1 tablet by mouth daily. .  sildenafil (REVATIO) 20 MG tablet, Take 20 mg by mouth. 3-5 tabs prn .  verapamil (CALAN-SR) 180 MG CR tablet, Take 1 tablet (180 mg total) by mouth daily.   Current Outpatient Medications (Analgesics):  .  aspirin 81 MG chewable tablet, CHEW 1 TABLET (81 MG TOTAL) BY MOUTH DAILY. [name]  diclofenac (VOLTAREN) 75 MG EC tablet, Take 1 tablet (75 mg total) by mouth 2 (two) times daily. Eduard Roux (AIMOVIG) 140 MG/ML SOAJ, Inject 140 mg into the skin every 30 (thirty) days. [name]  ibuprofen (ADVIL,MOTRIN) 600 MG tablet, TAKE 1 TABLET(600 MG) BY MOUTH EVERY 8 HOURS AS NEEDED .  meloxicam (MOBIC) 15 MG tablet, Take 1 tablet (15 mg total) by mouth daily. .  SUMAtriptan (IMITREX) 100 MG tablet, TAKE 1 TABLET(100 MG) BY MOUTH 1 TIME FOR UP TO 1 DOSE AS NEEDED. MAY REPEAT IN 2 HOURS IF HEADACHE PERSISTS OR RECURS .  traMADol (ULTRAM) 50 MG tablet, Take 1 tablet (50 mg total) by mouth every 6 (six) hours as needed.   Current Outpatient Medications (Other):  .  baclofen (LIORESAL) 10 MG tablet, Take 1 tablet (10 mg total) by mouth 3 (three) times daily. Prn for IBS flare .  Blood Glucose Monitoring Suppl (ONETOUCH VERIO) w/Device KIT, 1 Device  by Does not apply route 2 (two) times daily as needed. .  Diclofenac Sodium (PENNSAID) 2 % SOLN, Place 2 g onto the skin 2 (two) times daily. .  diclofenac sodium (VOLTAREN) 1 % GEL, Apply 2 g topically 4 (four) times daily. [name]  dicyclomine (BENTYL) 10 MG capsule, TAKE 1 CAPSULE (10 MG TOTAL) BY MOUTH 4 (FOUR) TIMES DAILY - BEFORE MEALS AND AT BEDTIME. [name]  gabapentin (NEURONTIN) 600 MG tablet, Take 600 mg by mouth as needed.  .  methocarbamol (ROBAXIN) 500 MG tablet, TAKE 1  TABLET BY MOUTH TWICE DAILY (Patient taking differently: Take 500 mg by mouth every 6 (six) hours as needed. ) .  omeprazole (PRILOSEC) 40 MG capsule, TAKE 1 CAPSULE (40 MG TOTAL) BY MOUTH DAILY AS NEEDED (INDIGESTION). [name]  timolol (TIMOPTIC-XR) 0.5 % ophthalmic gel-forming, Place 1 drop into both eyes daily.  .  Vitamin D, Ergocalciferol, (DRISDOL) 1.25 MG (50000 UT) CAPS capsule, Take 1 capsule (50,000 Units total) by mouth every 7 (seven) days.    Past medical history, social, surgical and family history all reviewed in electronic medical record.  No pertanent information unless stated regarding to the chief complaint.   Review of Systems:  No headache, visual changes, nausea, vomiting, diarrhea, constipation, dizziness, abdominal pain, skin rash, fevers, chills, night sweats, weight loss, swollen lymph nodes, body aches, joint swelling,  chest pain, shortness of breath, mood changes.  Positive muscle aches  Objective  Blood pressure (!) 160/90, pulse 76, height _0  (1.702 m), weight 213 lb (96.6 kg), SpO2 98 %.    General: No apparent distress alert and oriented x3 mood and affect normal, dressed appropriately.  HEENT: Pupils equal, extraocular movements intact  Respiratory: Patient's speak in full sentences and does not appear short of breath  Cardiovascular: No lower extremity edema, non tender, no erythema  Skin: Warm dry intact with no signs of infection or rash on extremities or on axial skeleton.  Abdomen: Soft  nontender  Neuro: Cranial nerves II through XII are intact, neurovascularly intact in all extremities with 2+ DTRs and 2+ pulses.  Lymph: No lymphadenopathy of posterior or anterior cervical chain or axillae bilaterally.  Gait normal with good balance and coordination.  MSK:  tender with limited range of motion and good stability and symmetric strength and tone of shoulders, elbows, wrist, hip, knee and ankles bilaterally.    Left knee exam shows the patient has had some mild atrophy of the VMO from previous exam.  Kneecap though is significantly less tender pain more over the lateral aspect of the knee.  LCL intact.  Pain over the popliteal area.  Negative squeeze test to the calf.     Impression and Recommendations:     This case required medical decision making of moderate complexity. The above documentation has been reviewed and is accurate and complete Lyndal Pulley, DO       Note: This dictation was prepared with Dragon dictation along with smaller phrase technology. Any transcriptional errors that result from this process are unintentional.

## 2019-01-21 NOTE — Patient Instructions (Signed)
  [address] 1st floor [place], [place] [address] Phone [phone]  Good to see you Do not wear the brace as much Keep using Pennsaid See me again in 4 weeks

## 2019-01-21 NOTE — Assessment & Plan Note (Addendum)
History of chondromalacia and likely now more of a popliteal tendinitis.  Encourage patient to continue the brace from time to time and we discussed the possibility of injection with patient declined.  Patient is good to work on more hamstring strengthening and can ice them popliteal tenderness playing more of a role at the moment.  Patient is to do topical anti-inflammatories and icing regimen.  Follow-up again in 4 to 8 weeks mild limitations at work at the moment.

## 2019-01-26 ENCOUNTER — Ambulatory Visit: Payer: 59 | Admitting: Physical Therapy

## 2019-01-27 ENCOUNTER — Ambulatory Visit: Payer: 59 | Admitting: Physical Therapy

## 2019-01-30 ENCOUNTER — Other Ambulatory Visit: Payer: Self-pay | Admitting: Internal Medicine

## 2019-01-31 ENCOUNTER — Other Ambulatory Visit: Payer: Self-pay | Admitting: Internal Medicine

## 2019-02-01 NOTE — Telephone Encounter (Signed)
Done erx 

## 2019-02-01 NOTE — Telephone Encounter (Signed)
No need further high dose vit d  Original advice was  Please take Vitamin D 50000 units weekly for 12 weeks, then plan to change to OTC Vitamin D3 at 2000 units per day, indefinitely.

## 2019-02-16 ENCOUNTER — Other Ambulatory Visit: Payer: Self-pay

## 2019-02-18 ENCOUNTER — Other Ambulatory Visit: Payer: Self-pay

## 2019-02-18 ENCOUNTER — Encounter: Payer: Self-pay | Admitting: Endocrinology

## 2019-02-18 ENCOUNTER — Ambulatory Visit (INDEPENDENT_AMBULATORY_CARE_PROVIDER_SITE_OTHER): Payer: 59 | Admitting: Endocrinology

## 2019-02-18 VITALS — BP 120/70 | HR 92 | Ht 67.0 in | Wt 215.6 lb

## 2019-02-18 DIAGNOSIS — E119 Type 2 diabetes mellitus without complications: Secondary | ICD-10-CM

## 2019-02-18 LAB — POCT GLYCOSYLATED HEMOGLOBIN (HGB A1C): Hemoglobin A1C: 6.6 % — AB (ref 4.0–5.6)

## 2019-02-18 MED ORDER — OZEMPIC (1 MG/DOSE) 2 MG/1.5ML ~~LOC~~ SOPN: 1.0000 mg | PEN_INJECTOR | SUBCUTANEOUS | 3 refills | Status: DC

## 2019-02-18 NOTE — Progress Notes (Signed)
Subjective:    Patient ID: Jason Ortega, male    DOB: [birth year], 54 y.o.   MRN: [id number]  HPI Pt returns for f/u of diabetes mellitus: DM type: Insulin-requiring type 2.  Dx'ed: 2263 Complications: polyneuropathy Therapy: insulin off and on since 2017, and 2 oral meds DKA: never Severe hypoglycemia: never Pancreatitis: never.  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 Interval history: he takes Ozempic, 0.5 mg q week.  He had to resume the insulin, 5 units qd.  pt states he feels well in general.  no cbg record, but states cbg's are 100-170.  Past Medical History:  Diagnosis Date  . Allergy   . Anemia   . Arthritis   . Cataract    bilateral cataracts removed  . Depression   . Diabetes mellitus   . Frequent headaches   . GERD (gastroesophageal reflux disease)   . Glaucoma   . Hypercholesteremia   . Hypertension   . Migraines   . Sleep apnea    wears c-PAP    Past Surgical History:  Procedure Laterality Date  . bilateral cataracts removed    . CARDIAC CATHETERIZATION  12/2011  . COLONOSCOPY    . LEFT HEART CATHETERIZATION WITH CORONARY ANGIOGRAM N/A 11/12/2011   Procedure: LEFT HEART CATHETERIZATION WITH CORONARY ANGIOGRAM;  Surgeon: Laverda Page, MD;  Location: [facility] CATH LAB;  Service: Cardiovascular;  Laterality: N/A;  . SHOULDER SURGERY      Social History   Socioeconomic History  . Marital status: Married    Spouse name: Not on file  . Number of children: 2  . Years of education: 53  . Highest education level: Not on file  Occupational History  . Occupation: Stocker  Tobacco Use  . Smoking status: Never Smoker  . Smokeless tobacco: Never Used  Substance and Sexual Activity  . Alcohol use: Yes    Comment: occasional  . Drug use: No  . Sexual activity: Yes    Birth control/protection: None  Other Topics Concern  . Not on file  Social History Narrative   Fun/Hobby:  Leisure centre manager football   Social Determinants of Health   Financial Resource Strain:   . Difficulty of Paying Living Expenses: Not on file  Food Insecurity:   . Worried About Charity fundraiser in the Last Year: Not on file  . Ran Out of Food in the Last Year: Not on file  Transportation Needs:   . Lack of Transportation (Medical): Not on file  . Lack of Transportation (Non-Medical): Not on file  Physical Activity:   . Days of Exercise per Week: Not on file  . Minutes of Exercise per Session: Not on file  Stress:   . Feeling of Stress : Not on file  Social Connections:   . Frequency of Communication with Friends and Family: Not on file  . Frequency of Social Gatherings with Friends and Family: Not on file  . Attends Religious Services: Not on file  . Active Member of Clubs or Organizations: Not on file  . Attends Archivist Meetings: Not on file  . Marital Status: Not on file  Intimate Partner Violence:   . Fear of Current or Ex-Partner: Not on file  . Emotionally Abused: Not on file  . Physically Abused: Not on file  . Sexually Abused: Not on file    Current Outpatient Medications on File Prior to Visit  Medication Sig Dispense  Refill  . aspirin 81 MG chewable tablet CHEW 1 TABLET (81 MG TOTAL) BY MOUTH DAILY. 36 tablet 0  . atorvastatin (LIPITOR) 40 MG tablet TAKE 1 TABLET BY MOUTH EVERY DAY 90 tablet 1  . baclofen (LIORESAL) 10 MG tablet Take 1 tablet (10 mg total) by mouth 3 (three) times daily. Prn for IBS flare 30 each 0  . Blood Glucose Monitoring Suppl (ONETOUCH VERIO) w/Device KIT 1 Device by Does not apply route 2 (two) times daily as needed. 1 kit 0  . dapagliflozin propanediol (FARXIGA) 10 MG TABS tablet Take 10 mg by mouth daily. 90 tablet 3  . diclofenac (VOLTAREN) 75 MG EC tablet Take 1 tablet (75 mg total) by mouth 2 (two) times daily. 60 tablet 5  . Diclofenac Sodium (PENNSAID) 2 % SOLN Place 2 g onto the skin 2 (two) times daily. 112 g 3  . diclofenac  sodium (VOLTAREN) 1 % GEL Apply 2 g topically 4 (four) times daily. 200 g 5  . dicyclomine (BENTYL) 10 MG capsule TAKE 1 CAPSULE (10 MG TOTAL) BY MOUTH 4 (FOUR) TIMES DAILY - BEFORE MEALS AND AT BEDTIME. 90 capsule 3  . Erenumab-aooe (AIMOVIG) 140 MG/ML SOAJ Inject 140 mg into the skin every 30 (thirty) days. 1 pen 10  . gabapentin (NEURONTIN) 600 MG tablet Take 600 mg by mouth as needed.     [name] ibuprofen (ADVIL,MOTRIN) 600 MG tablet TAKE 1 TABLET(600 MG) BY MOUTH EVERY 8 HOURS AS NEEDED 30 tablet 0  . losartan-hydrochlorothiazide (HYZAAR) 100-25 MG tablet Take 1 tablet by mouth daily. 90 tablet 3  . meloxicam (MOBIC) 15 MG tablet Take 1 tablet (15 mg total) by mouth daily. 30 tablet 5  . metFORMIN (GLUCOPHAGE) 1000 MG tablet Take 1 tablet (1,000 mg total) by mouth 2 (two) times daily with a meal. 180 tablet 3  . methocarbamol (ROBAXIN) 500 MG tablet TAKE 1 TABLET BY MOUTH TWICE DAILY (Patient taking differently: Take 500 mg by mouth every 6 (six) hours as needed. ) 20 tablet 0  . omeprazole (PRILOSEC) 40 MG capsule TAKE 1 CAPSULE (40 MG TOTAL) BY MOUTH DAILY AS NEEDED (INDIGESTION). 90 capsule 1  . sildenafil (REVATIO) 20 MG tablet Take 20 mg by mouth. 3-5 tabs prn    . SUMAtriptan (IMITREX) 100 MG tablet TAKE 1 TABLET(100 MG) BY MOUTH 1 TIME FOR UP TO 1 DOSE AS NEEDED. MAY REPEAT IN 2 HOURS IF HEADACHE PERSISTS OR RECURS 10 tablet 9  . timolol (TIMOPTIC-XR) 0.5 % ophthalmic gel-forming Place 1 drop into both eyes daily.     . traMADol (ULTRAM) 50 MG tablet TAKE 1 TABLET BY MOUTH EVERY 6 HOURS AS NEEDED 60 tablet 0  . verapamil (CALAN-SR) 180 MG CR tablet Take 1 tablet (180 mg total) by mouth daily. 90 tablet 1  . Vitamin D, Ergocalciferol, (DRISDOL) 1.25 MG (50000 UT) CAPS capsule Take 1 capsule (50,000 Units total) by mouth every 7 (seven) days. 12 capsule 0   No current facility-administered medications on file prior to visit.    Allergies  Allergen Reactions  . Pollen Extract Other (See  Comments)    Stuffy nose    Family History  Problem Relation Age of Onset  . Arthritis Mother   . Stroke Mother   . Hypertension Father   . Diabetes Father   . Cancer Father        Multiple Myleoma  . Diabetes Maternal Grandfather   . Colon cancer Neg Hx   . Esophageal cancer  Neg Hx   . Rectal cancer Neg Hx   . Stomach cancer Neg Hx     BP 120/70 (BP Location: Right Arm, Patient Position: Sitting, Cuff Size: Large)   Pulse 92   Ht '5\' 7"'$  (1.702 m)   Wt 215 lb 9.6 oz (97.8 kg)   SpO2 96%   BMI 33.77 kg/m    Review of Systems He denies hypoglycemia    Objective:   Physical Exam VITAL SIGNS:  See vs page GENERAL: no distress Pulses: dorsalis pedis intact bilat.   MSK: no deformity of the feet CV: no leg edema Skin:  no ulcer on the feet.  normal color and temp on the feet. Neuro: sensation is intact to touch on the feet  Lab Results  Component Value Date   HGBA1C 6.6 (A) 02/18/2019        Assessment & Plan:  Type 2 DM: well-controlled.  He can try again to d/c insulin Edema: improved, but This still limits rx options  Patient Instructions  I have sent a prescription to your pharmacy, to increase the Ozempic again, and: Please go off the insulin, and: continue the same other other diabetes medications. check your blood sugar twice a day.  vary the time of day when you check, between before the 3 meals, and at bedtime.  also check if you have symptoms of your blood sugar being too high or too low.  please keep a record of the readings and bring it to your next appointment here (or you can bring the meter itself).  You can write it on any piece of paper.  please call us sooner if your blood sugar goes below 70, or if you have a lot of readings over 200.  Please come back for a follow-up appointment in 3 months.

## 2019-02-18 NOTE — Patient Instructions (Addendum)
I have sent a prescription to your pharmacy, to increase the Ozempic again, and: Please go off the insulin, and: continue the same other other diabetes medications. check your blood sugar twice a day.  vary the time of day when you check, between before the 3 meals, and at bedtime.  also check if you have symptoms of your blood sugar being too high or too low.  please keep a record of the readings and bring it to your next appointment here (or you can bring the meter itself).  You can write it on any piece of paper.  please call us sooner if your blood sugar goes below 70, or if you have a lot of readings over 200.  Please come back for a follow-up appointment in 3 months.

## 2019-02-25 ENCOUNTER — Telehealth: Payer: Self-pay

## 2019-02-25 NOTE — Telephone Encounter (Signed)
Informed patient that he can email the documents to [email or web address]. We will fill the documents out but patient will have to pick up the documents from the office. He voices his understanding.

## 2019-02-25 NOTE — Telephone Encounter (Signed)
Patient called wanting to know if we still had a copy of the forms that were filled out at his appointment. [employer] form for his job states his job claiming they do not have the forms or miss placed the forms. States he needs a copy of both so he can send them again. I believe this was from the December appointment.  I seen a letter that was written but did not see anything that was sent to scan, was not sure if a copy was kept or I was just missing where they were located in the chart.

## 2019-02-26 ENCOUNTER — Ambulatory Visit: Payer: 59 | Admitting: Internal Medicine

## 2019-03-03 ENCOUNTER — Encounter: Payer: Self-pay | Admitting: Internal Medicine

## 2019-03-03 ENCOUNTER — Telehealth: Payer: Self-pay

## 2019-03-03 ENCOUNTER — Ambulatory Visit (INDEPENDENT_AMBULATORY_CARE_PROVIDER_SITE_OTHER): Payer: 59 | Admitting: Internal Medicine

## 2019-03-03 ENCOUNTER — Other Ambulatory Visit: Payer: Self-pay

## 2019-03-03 VITALS — BP 134/82 | HR 81 | Temp 98.6°F | Ht 67.0 in | Wt 212.0 lb

## 2019-03-03 DIAGNOSIS — E559 Vitamin D deficiency, unspecified: Secondary | ICD-10-CM

## 2019-03-03 DIAGNOSIS — E785 Hyperlipidemia, unspecified: Secondary | ICD-10-CM | POA: Diagnosis not present

## 2019-03-03 DIAGNOSIS — I10 Essential (primary) hypertension: Secondary | ICD-10-CM

## 2019-03-03 DIAGNOSIS — E119 Type 2 diabetes mellitus without complications: Secondary | ICD-10-CM

## 2019-03-03 NOTE — Telephone Encounter (Signed)
Patient came to my desk after his appointment with Dr. Jonny Ruiz to say that once the forms he dropped off were complete to give him a call. [phone].

## 2019-03-03 NOTE — Progress Notes (Addendum)
Subjective:    Patient ID: Jason Ortega, male    DOB: Mar 25, 1965, 54 y.o.   MRN: [id number]  HPI  Here to f/u; overall doing ok,  Pt denies chest pain, increasing sob or doe, wheezing, orthopnea, PND, increased LE swelling, palpitations, dizziness or syncope.  Pt denies new neurological symptoms such as new headache, or facial or extremity weakness or numbness.  Pt denies polydipsia, polyuria, or low sugar episode.  Pt states overall good compliance with meds, mostly trying to follow appropriate diet, with wt overall stable,  but little exercise however.  Tolerating Vit d well. Past Medical History:  Diagnosis Date  . Allergy   . Anemia   . Arthritis   . Cataract    bilateral cataracts removed  . Depression   . Diabetes mellitus   . Frequent headaches   . GERD (gastroesophageal reflux disease)   . Glaucoma   . Hypercholesteremia   . Hypertension   . Migraines   . Sleep apnea    wears c-PAP   Past Surgical History:  Procedure Laterality Date  . bilateral cataracts removed    . CARDIAC CATHETERIZATION  12/2011  . COLONOSCOPY    . LEFT HEART CATHETERIZATION WITH CORONARY ANGIOGRAM N/A 11/12/2011   Procedure: LEFT HEART CATHETERIZATION WITH CORONARY ANGIOGRAM;  Surgeon: Laverda Page, MD;  Location: [facility] CATH LAB;  Service: Cardiovascular;  Laterality: N/A;  . SHOULDER SURGERY      reports that he has never smoked. He has never used smokeless tobacco. He reports current alcohol use. He reports that he does not use drugs. family history includes Arthritis in his mother; Cancer in his father; Diabetes in his father and maternal grandfather; Hypertension in his father; Stroke in his mother. Allergies  Allergen Reactions  . Pollen Extract Other (See Comments)    Stuffy nose   Current Outpatient Medications on File Prior to Visit  Medication Sig Dispense Refill  . aspirin 81 MG chewable tablet CHEW 1 TABLET (81 MG TOTAL) BY MOUTH DAILY. 36 tablet 0  . atorvastatin (LIPITOR) 40 MG  tablet TAKE 1 TABLET BY MOUTH EVERY DAY 90 tablet 1  . baclofen (LIORESAL) 10 MG tablet Take 1 tablet (10 mg total) by mouth 3 (three) times daily. Prn for IBS flare 30 each 0  . Blood Glucose Monitoring Suppl (ONETOUCH VERIO) w/Device KIT 1 Device by Does not apply route 2 (two) times daily as needed. 1 kit 0  . dapagliflozin propanediol (FARXIGA) 10 MG TABS tablet Take 10 mg by mouth daily. 90 tablet 3  . diclofenac (VOLTAREN) 75 MG EC tablet Take 1 tablet (75 mg total) by mouth 2 (two) times daily. 60 tablet 5  . Diclofenac Sodium (PENNSAID) 2 % SOLN Place 2 g onto the skin 2 (two) times daily. 112 g 3  . diclofenac sodium (VOLTAREN) 1 % GEL Apply 2 g topically 4 (four) times daily. 200 g 5  . dicyclomine (BENTYL) 10 MG capsule TAKE 1 CAPSULE (10 MG TOTAL) BY MOUTH 4 (FOUR) TIMES DAILY - BEFORE MEALS AND AT BEDTIME. 90 capsule 3  . Erenumab-aooe (AIMOVIG) 140 MG/ML SOAJ Inject 140 mg into the skin every 30 (thirty) days. 1 pen 10  . gabapentin (NEURONTIN) 600 MG tablet Take 600 mg by mouth as needed.     [name] ibuprofen (ADVIL,MOTRIN) 600 MG tablet TAKE 1 TABLET(600 MG) BY MOUTH EVERY 8 HOURS AS NEEDED 30 tablet 0  . losartan-hydrochlorothiazide (HYZAAR) 100-25 MG tablet Take 1 tablet by mouth daily. [place]  tablet 3  . meloxicam (MOBIC) 15 MG tablet Take 1 tablet (15 mg total) by mouth daily. 30 tablet 5  . metFORMIN (GLUCOPHAGE) 1000 MG tablet Take 1 tablet (1,000 mg total) by mouth 2 (two) times daily with a meal. 180 tablet 3  . methocarbamol (ROBAXIN) 500 MG tablet TAKE 1 TABLET BY MOUTH TWICE DAILY (Patient taking differently: Take 500 mg by mouth every 6 (six) hours as needed. ) 20 tablet 0  . omeprazole (PRILOSEC) 40 MG capsule TAKE 1 CAPSULE (40 MG TOTAL) BY MOUTH DAILY AS NEEDED (INDIGESTION). 90 capsule 1  . PENNSAID 2 % SOLN SMARTSIG:2 Pump Topical Twice Daily    . Semaglutide, 1 MG/DOSE, (OZEMPIC, 1 MG/DOSE,) 2 MG/1.5ML SOPN Inject 1 mg into the skin once a week. 12 pen 3  . sildenafil  (REVATIO) 20 MG tablet Take 20 mg by mouth. 3-5 tabs prn    . SUMAtriptan (IMITREX) 100 MG tablet TAKE 1 TABLET(100 MG) BY MOUTH 1 TIME FOR UP TO 1 DOSE AS NEEDED. MAY REPEAT IN 2 HOURS IF HEADACHE PERSISTS OR RECURS 10 tablet 9  . timolol (TIMOPTIC-XR) 0.5 % ophthalmic gel-forming Place 1 drop into both eyes daily.     . traMADol (ULTRAM) 50 MG tablet TAKE 1 TABLET BY MOUTH EVERY 6 HOURS AS NEEDED 60 tablet 0  . verapamil (CALAN-SR) 180 MG CR tablet Take 1 tablet (180 mg total) by mouth daily. 90 tablet 1  . Vitamin D, Ergocalciferol, (DRISDOL) 1.25 MG (50000 UT) CAPS capsule Take 1 capsule (50,000 Units total) by mouth every 7 (seven) days. 12 capsule 0   No current facility-administered medications on file prior to visit.   Review of Systems All otherwise neg per pt     Objective:   Physical Exam BP 134/82 (BP Location: Left Arm, Patient Position: Sitting, Cuff Size: Large)   Pulse 81   Temp 98.6 F (37 C) (Oral)   Ht '5\' 7"'$  (1.702 m)   Wt 212 lb (96.2 kg)   SpO2 97%   BMI 33.20 kg/m  VS noted,  Constitutional: Pt appears in NAD HENT: Head: NCAT.  Right Ear: External ear normal.  Left Ear: External ear normal.  Eyes: . Pupils are equal, round, and reactive to light. Conjunctivae and EOM are normal Nose: without d/c or deformity Neck: Neck supple. Gross normal ROM Cardiovascular: Normal rate and regular rhythm.   Pulmonary/Chest: Effort normal and breath sounds without rales or wheezing.  Abd:  Soft, NT, ND, + BS, no organomegaly Neurological: Pt is alert. At baseline orientation, motor grossly intact Skin: Skin is warm. No rashes, other new lesions, no LE edema Psychiatric: Pt behavior is normal without agitation  All otherwise neg per pt Lab Results  Component Value Date   WBC 6.1 09/22/2018   HGB 13.6 09/22/2018   HCT 44.2 09/22/2018   PLT 261 09/22/2018   GLUCOSE 126 (H) 08/22/2018   CHOL 147 08/22/2018   TRIG 87.0 08/22/2018   HDL 54.50 08/22/2018   LDLCALC 75  08/22/2018   ALT 23 08/22/2018   AST 20 08/22/2018   NA 141 08/22/2018   K 4.0 08/22/2018   CL 104 08/22/2018   CREATININE 0.97 08/22/2018   BUN 15 08/22/2018   CO2 29 08/22/2018   TSH 0.98 08/22/2018   PSA 0.59 08/22/2018   HGBA1C 6.6 (A) 02/18/2019   MICROALBUR 1.6 06/06/2016       Assessment & Plan:

## 2019-03-03 NOTE — Patient Instructions (Signed)
Please continue all other medications as before, and refills have been done if requested.  Please have the pharmacy call with any other refills you may need.  Please continue your efforts at being more active, low cholesterol diet, and weight control.  Please keep your appointments with your specialists as you may have planned  Please make an Appointment to return in 6 months, or sooner if needed 

## 2019-03-03 NOTE — Telephone Encounter (Signed)
Paper work has been filled out. Will leave up front with [facility].

## 2019-03-04 ENCOUNTER — Encounter: Payer: Self-pay | Admitting: Internal Medicine

## 2019-03-04 DIAGNOSIS — E559 Vitamin D deficiency, unspecified: Secondary | ICD-10-CM | POA: Insufficient documentation

## 2019-03-04 NOTE — Assessment & Plan Note (Signed)
stable overall by history and exam, recent data reviewed with pt, and pt to continue medical treatment as before,  to f/u any worsening symptoms or concerns  

## 2019-03-04 NOTE — Assessment & Plan Note (Addendum)
stable overall by history and exam, recent data reviewed with pt, and pt to continue medical treatment as before,  to f/u any worsening symptoms or concerns  I spent 32 minutes preparing to see the patient by review of recent labs, imaging and procedures, obtaining and reviewing separately obtained history, communicating with the patient and family or caregiver, ordering medications, tests or procedures, and documenting clinical information in the EHR including the differential Dx, treatment, and any further evaluation and other management of DM, HLD, HTN, vit d def

## 2019-03-08 ENCOUNTER — Other Ambulatory Visit: Payer: Self-pay | Admitting: Internal Medicine

## 2019-04-20 ENCOUNTER — Ambulatory Visit: Payer: 59 | Admitting: Family Medicine

## 2019-05-14 ENCOUNTER — Other Ambulatory Visit: Payer: Self-pay | Admitting: Neurology

## 2019-05-18 ENCOUNTER — Other Ambulatory Visit: Payer: Self-pay

## 2019-05-20 ENCOUNTER — Ambulatory Visit (INDEPENDENT_AMBULATORY_CARE_PROVIDER_SITE_OTHER): Payer: 59 | Admitting: Endocrinology

## 2019-05-20 ENCOUNTER — Other Ambulatory Visit: Payer: Self-pay

## 2019-05-20 ENCOUNTER — Encounter: Payer: Self-pay | Admitting: Endocrinology

## 2019-05-20 VITALS — BP 156/88 | HR 93 | Ht 67.0 in | Wt 214.8 lb

## 2019-05-20 DIAGNOSIS — E119 Type 2 diabetes mellitus without complications: Secondary | ICD-10-CM

## 2019-05-20 LAB — POCT GLYCOSYLATED HEMOGLOBIN (HGB A1C): Hemoglobin A1C: 6.6 % — AB (ref 4.0–5.6)

## 2019-05-20 MED ORDER — METFORMIN HCL 1000 MG PO TABS: 1000.0000 mg | ORAL_TABLET | Freq: Two times a day (BID) | ORAL | 3 refills | Status: DC

## 2019-05-20 MED ORDER — FARXIGA 10 MG PO TABS: 10.0000 mg | ORAL_TABLET | Freq: Every day | ORAL | 3 refills | Status: DC

## 2019-05-20 MED ORDER — OZEMPIC (1 MG/DOSE) 2 MG/1.5ML ~~LOC~~ SOPN: 1.0000 mg | PEN_INJECTOR | SUBCUTANEOUS | 3 refills | Status: DC

## 2019-05-20 NOTE — Progress Notes (Signed)
Subjective:    Patient ID: Jason Ortega, male    DOB: 09-Aug-1965, 54 y.o.   MRN: [id number]  HPI Pt returns for f/u of diabetes mellitus:  DM type: 2 Dx'ed: 2409 Complications: PN Therapy: Ozempic and 2 oral meds DKA: never Severe hypoglycemia: never Pancreatitis: never.  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   Interval history: pt states he feels well in general.  He takes meds as rx'ed.  Pt states cbg's are well-controlled.   Past Medical History:  Diagnosis Date  . Allergy   . Anemia   . Arthritis   . Cataract    bilateral cataracts removed  . Depression   . Diabetes mellitus   . Frequent headaches   . GERD (gastroesophageal reflux disease)   . Glaucoma   . Hypercholesteremia   . Hypertension   . Migraines   . Sleep apnea    wears c-PAP    Past Surgical History:  Procedure Laterality Date  . bilateral cataracts removed    . CARDIAC CATHETERIZATION  12/2011  . COLONOSCOPY    . LEFT HEART CATHETERIZATION WITH CORONARY ANGIOGRAM N/A 11/12/2011   Procedure: LEFT HEART CATHETERIZATION WITH CORONARY ANGIOGRAM;  Surgeon: Laverda Page, MD;  Location: [facility] CATH LAB;  Service: Cardiovascular;  Laterality: N/A;  . SHOULDER SURGERY      Social History   Socioeconomic History  . Marital status: Married    Spouse name: Not on file  . Number of children: 2  . Years of education: 45  . Highest education level: Not on file  Occupational History  . Occupation: Stocker  Tobacco Use  . Smoking status: Never Smoker  . Smokeless tobacco: Never Used  Substance and Sexual Activity  . Alcohol use: Yes    Comment: occasional  . Drug use: No  . Sexual activity: Yes    Birth control/protection: None  Other Topics Concern  . Not on file  Social History Narrative   Fun/Hobby: Leisure centre manager football   Social Determinants of Health   Financial Resource Strain:   . Difficulty of Paying Living  Expenses:   Food Insecurity:   . Worried About Charity fundraiser in the Last Year:   . Arboriculturist in the Last Year:   Transportation Needs:   . Film/video editor (Medical):   [name] Lack of Transportation (Non-Medical):   Physical Activity:   . Days of Exercise per Week:   . Minutes of Exercise per Session:   Stress:   . Feeling of Stress :   Social Connections:   . Frequency of Communication with Friends and Family:   . Frequency of Social Gatherings with Friends and Family:   . Attends Religious Services:   . Active Member of Clubs or Organizations:   . Attends Archivist Meetings:   [name] Marital Status:   Intimate Partner Violence:   . Fear of Current or Ex-Partner:   . Emotionally Abused:   [name] Physically Abused:   . Sexually Abused:     Current Outpatient Medications on File Prior to Visit  Medication Sig Dispense Refill  . aspirin 81 MG chewable tablet CHEW 1 TABLET (81 MG TOTAL) BY MOUTH DAILY. 36 tablet 0  . atorvastatin (LIPITOR) 40 MG tablet TAKE 1 TABLET BY MOUTH EVERY DAY 90 tablet 1  . baclofen (LIORESAL) 10 MG tablet Take 1 tablet (10 mg total) by mouth 3 (three) times  daily. Prn for IBS flare 30 each 0  . Blood Glucose Monitoring Suppl (ONETOUCH VERIO) w/Device KIT 1 Device by Does not apply route 2 (two) times daily as needed. 1 kit 0  . diclofenac (VOLTAREN) 75 MG EC tablet Take 1 tablet (75 mg total) by mouth 2 (two) times daily. 60 tablet 5  . Diclofenac Sodium (PENNSAID) 2 % SOLN Place 2 g onto the skin 2 (two) times daily. 112 g 3  . diclofenac sodium (VOLTAREN) 1 % GEL Apply 2 g topically 4 (four) times daily. 200 g 5  . dicyclomine (BENTYL) 10 MG capsule TAKE 1 CAPSULE (10 MG TOTAL) BY MOUTH 4 (FOUR) TIMES DAILY - BEFORE MEALS AND AT BEDTIME. 90 capsule 3  . Erenumab-aooe (AIMOVIG) 140 MG/ML SOAJ Inject 140 mg into the skin every 30 (thirty) days. 1 pen 10  . gabapentin (NEURONTIN) 600 MG tablet Take 600 mg by mouth as needed.     [name] ibuprofen  (ADVIL,MOTRIN) 600 MG tablet TAKE 1 TABLET(600 MG) BY MOUTH EVERY 8 HOURS AS NEEDED 30 tablet 0  . losartan-hydrochlorothiazide (HYZAAR) 100-25 MG tablet Take 1 tablet by mouth daily. 90 tablet 3  . meloxicam (MOBIC) 15 MG tablet Take 1 tablet (15 mg total) by mouth daily. 30 tablet 5  . methocarbamol (ROBAXIN) 500 MG tablet TAKE 1 TABLET BY MOUTH TWICE DAILY (Patient taking differently: Take 500 mg by mouth every 6 (six) hours as needed. ) 20 tablet 0  . omeprazole (PRILOSEC) 40 MG capsule TAKE 1 CAPSULE (40 MG TOTAL) BY MOUTH DAILY AS NEEDED (INDIGESTION). 90 capsule 1  . PENNSAID 2 % SOLN SMARTSIG:2 Pump Topical Twice Daily    . sildenafil (REVATIO) 20 MG tablet Take 20 mg by mouth. 3-5 tabs prn    . SUMAtriptan (IMITREX) 100 MG tablet TAKE 1 TABLET(100 MG) BY MOUTH 1 TIME FOR UP TO 1 DOSE AS NEEDED. MAY REPEAT IN 2 HOURS IF HEADACHE PERSISTS OR RECURS 10 tablet 9  . timolol (TIMOPTIC-XR) 0.5 % ophthalmic gel-forming Place 1 drop into both eyes daily.     . traMADol (ULTRAM) 50 MG tablet TAKE 1 TABLET BY MOUTH EVERY 6 HOURS AS NEEDED 60 tablet 0  . verapamil (CALAN-SR) 180 MG CR tablet Take 1 tablet (180 mg total) by mouth daily. 90 tablet 1  . Vitamin D, Ergocalciferol, (DRISDOL) 1.25 MG (50000 UT) CAPS capsule Take 1 capsule (50,000 Units total) by mouth every 7 (seven) days. 12 capsule 0   No current facility-administered medications on file prior to visit.    Allergies  Allergen Reactions  . Pollen Extract Other (See Comments)    Stuffy nose    Family History  Problem Relation Age of Onset  . Arthritis Mother   . Stroke Mother   . Hypertension Father   . Diabetes Father   . Cancer Father        Multiple Myleoma  . Diabetes Maternal Grandfather   . Colon cancer Neg Hx   . Esophageal cancer Neg Hx   . Rectal cancer Neg Hx   . Stomach cancer Neg Hx     BP (!) 156/88   Pulse 93   Ht 5' 7" (1.702 m)   Wt 214 lb 12.8 oz (97.4 kg)   SpO2 97%   BMI 33.64 kg/m    Review of  Systems Denies nausea    Objective:   Physical Exam VITAL SIGNS:  See vs page GENERAL: no distress Pulses: dorsalis pedis intact bilat.   MSK:  no deformity of the feet CV: trace bilat leg edema Skin:  no ulcer on the feet.  normal color and temp on the feet. Neuro: sensation is intact to touch on the feet.  Lab Results  Component Value Date   HGBA1C 6.6 (A) 02/18/2019    Lab Results  Component Value Date   CREATININE 0.97 08/22/2018   BUN 15 08/22/2018   NA 141 08/22/2018   K 4.0 08/22/2018   CL 104 08/22/2018   CO2 29 08/22/2018      Assessment & Plan:  Type 2 DM, with PN: well-controlled Edema, stable: This limits rx options HTN, prob situational: recheck next time  Patient Instructions  continue the same other diabetes medications. check your blood sugar twice a day.  vary the time of day when you check, between before the 3 meals, and at bedtime.  also check if you have symptoms of your blood sugar being too high or too low.  please keep a record of the readings and bring it to your next appointment here (or you can bring the meter itself).  You can write it on any piece of paper.  please call us sooner if your blood sugar goes below 70, or if you have a lot of readings over 200.  Please come back for a follow-up appointment in 4 months.

## 2019-05-20 NOTE — Patient Instructions (Signed)
continue the same other diabetes medications. check your blood sugar twice a day.  vary the time of day when you check, between before the 3 meals, and at bedtime.  also check if you have symptoms of your blood sugar being too high or too low.  please keep a record of the readings and bring it to your next appointment here (or you can bring the meter itself).  You can write it on any piece of paper.  please call us sooner if your blood sugar goes below 70, or if you have a lot of readings over 200.  Please come back for a follow-up appointment in 4 months.

## 2019-05-26 ENCOUNTER — Ambulatory Visit (INDEPENDENT_AMBULATORY_CARE_PROVIDER_SITE_OTHER): Payer: 59 | Admitting: Internal Medicine

## 2019-05-26 ENCOUNTER — Other Ambulatory Visit: Payer: Self-pay

## 2019-05-26 ENCOUNTER — Encounter: Payer: Self-pay | Admitting: Internal Medicine

## 2019-05-26 DIAGNOSIS — I10 Essential (primary) hypertension: Secondary | ICD-10-CM

## 2019-05-26 DIAGNOSIS — E119 Type 2 diabetes mellitus without complications: Secondary | ICD-10-CM | POA: Diagnosis not present

## 2019-05-26 DIAGNOSIS — N451 Epididymitis: Secondary | ICD-10-CM | POA: Diagnosis not present

## 2019-05-26 MED ORDER — DOXYCYCLINE HYCLATE 100 MG PO TABS: 100.0000 mg | ORAL_TABLET | Freq: Two times a day (BID) | ORAL | 0 refills | Status: DC

## 2019-05-26 NOTE — Progress Notes (Signed)
Subjective:    Patient ID: Jason Ortega, male    DOB: 08-20-1965, 54 y.o.   MRN: [id number]  HPI  Here with 1 wk onset right groin pain and sorenes without swelling, fever, penile d/c and Denies urinary symptoms such as dysuria, frequency, urgency, flank pain, hematuria or n/v, fever, chills.  Pain is mild to mod, sharp 'nerve like'.  Denies worsening reflux, abd pain, dysphagia, n/v, bowel change or blood.  Pt denies chest pain, increased sob or doe, wheezing, orthopnea, PND, increased LE swelling, palpitations, dizziness or syncope.   Pt denies polydipsia, polyuria  Past Medical History:  Diagnosis Date  . Allergy   . Anemia   . Arthritis   . Cataract    bilateral cataracts removed  . Depression   . Diabetes mellitus   . Frequent headaches   . GERD (gastroesophageal reflux disease)   . Glaucoma   . Hypercholesteremia   . Hypertension   . Migraines   . Sleep apnea    wears c-PAP   Past Surgical History:  Procedure Laterality Date  . bilateral cataracts removed    . CARDIAC CATHETERIZATION  12/2011  . COLONOSCOPY    . LEFT HEART CATHETERIZATION WITH CORONARY ANGIOGRAM N/A 11/12/2011   Procedure: LEFT HEART CATHETERIZATION WITH CORONARY ANGIOGRAM;  Surgeon: Laverda Page, MD;  Location: [facility] CATH LAB;  Service: Cardiovascular;  Laterality: N/A;  . SHOULDER SURGERY      reports that he has never smoked. He has never used smokeless tobacco. He reports current alcohol use. He reports that he does not use drugs. family history includes Arthritis in his mother; Cancer in his father; Diabetes in his father and maternal grandfather; Hypertension in his father; Stroke in his mother. Allergies  Allergen Reactions  . Pollen Extract Other (See Comments)    Stuffy nose   Current Outpatient Medications on File Prior to Visit  Medication Sig Dispense Refill  . aspirin 81 MG chewable tablet CHEW 1 TABLET (81 MG TOTAL) BY MOUTH DAILY. 36 tablet 0  . atorvastatin (LIPITOR) 40 MG tablet  TAKE 1 TABLET BY MOUTH EVERY DAY 90 tablet 1  . baclofen (LIORESAL) 10 MG tablet Take 1 tablet (10 mg total) by mouth 3 (three) times daily. Prn for IBS flare 30 each 0  . Blood Glucose Monitoring Suppl (ONETOUCH VERIO) w/Device KIT 1 Device by Does not apply route 2 (two) times daily as needed. 1 kit 0  . dapagliflozin propanediol (FARXIGA) 10 MG TABS tablet Take 10 mg by mouth daily. 90 tablet 3  . diclofenac (VOLTAREN) 75 MG EC tablet Take 1 tablet (75 mg total) by mouth 2 (two) times daily. 60 tablet 5  . Diclofenac Sodium (PENNSAID) 2 % SOLN Place 2 g onto the skin 2 (two) times daily. 112 g 3  . diclofenac sodium (VOLTAREN) 1 % GEL Apply 2 g topically 4 (four) times daily. 200 g 5  . dicyclomine (BENTYL) 10 MG capsule TAKE 1 CAPSULE (10 MG TOTAL) BY MOUTH 4 (FOUR) TIMES DAILY - BEFORE MEALS AND AT BEDTIME. 90 capsule 3  . Erenumab-aooe (AIMOVIG) 140 MG/ML SOAJ Inject 140 mg into the skin every 30 (thirty) days. 1 pen 10  . gabapentin (NEURONTIN) 600 MG tablet Take 600 mg by mouth as needed.     [name] ibuprofen (ADVIL,MOTRIN) 600 MG tablet TAKE 1 TABLET(600 MG) BY MOUTH EVERY 8 HOURS AS NEEDED 30 tablet 0  . losartan-hydrochlorothiazide (HYZAAR) 100-25 MG tablet Take 1 tablet by mouth daily. 90 tablet  3  . meloxicam (MOBIC) 15 MG tablet Take 1 tablet (15 mg total) by mouth daily. 30 tablet 5  . metFORMIN (GLUCOPHAGE) 1000 MG tablet Take 1 tablet (1,000 mg total) by mouth 2 (two) times daily with a meal. 180 tablet 3  . methocarbamol (ROBAXIN) 500 MG tablet TAKE 1 TABLET BY MOUTH TWICE DAILY (Patient taking differently: Take 500 mg by mouth every 6 (six) hours as needed. ) 20 tablet 0  . omeprazole (PRILOSEC) 40 MG capsule TAKE 1 CAPSULE (40 MG TOTAL) BY MOUTH DAILY AS NEEDED (INDIGESTION). 90 capsule 1  . PENNSAID 2 % SOLN SMARTSIG:2 Pump Topical Twice Daily    . Semaglutide, 1 MG/DOSE, (OZEMPIC, 1 MG/DOSE,) 2 MG/1.5ML SOPN Inject 1 mg into the skin once a week. 12 pen 3  . sildenafil (REVATIO) 20  MG tablet Take 20 mg by mouth. 3-5 tabs prn    . SUMAtriptan (IMITREX) 100 MG tablet TAKE 1 TABLET(100 MG) BY MOUTH 1 TIME FOR UP TO 1 DOSE AS NEEDED. MAY REPEAT IN 2 HOURS IF HEADACHE PERSISTS OR RECURS 10 tablet 9  . timolol (TIMOPTIC-XR) 0.5 % ophthalmic gel-forming Place 1 drop into both eyes daily.     . traMADol (ULTRAM) 50 MG tablet TAKE 1 TABLET BY MOUTH EVERY 6 HOURS AS NEEDED 60 tablet 0  . verapamil (CALAN-SR) 180 MG CR tablet Take 1 tablet (180 mg total) by mouth daily. 90 tablet 1  . Vitamin D, Ergocalciferol, (DRISDOL) 1.25 MG (50000 UT) CAPS capsule Take 1 capsule (50,000 Units total) by mouth every 7 (seven) days. 12 capsule 0   No current facility-administered medications on file prior to visit.   Review of Systems All otherwise neg per pt    Objective:   Physical Exam BP 140/80 (BP Location: Left Arm, Patient Position: Sitting, Cuff Size: Large)   Pulse 93   Temp 98.2 F (36.8 C) (Oral)   Ht '5\' 7"'$  (1.702 m)   Wt 216 lb (98 kg)   SpO2 97%   BMI 33.83 kg/m  VS noted,  Constitutional: Pt appears in NAD HENT: Head: NCAT.  Right Ear: External ear normal.  Left Ear: External ear normal.  Eyes: . Pupils are equal, round, and reactive to light. Conjunctivae and EOM are normal Nose: without d/c or deformity Neck: Neck supple. Gross normal ROM Cardiovascular: Normal rate and regular rhythm.   Pulmonary/Chest: Effort normal and breath sounds without rales or wheezing.  Abd:  Soft, NT, ND, + BS, no organomegaly Gu;+ tender right epidymus with non tender non swelling right testicle Neurological: Pt is alert. At baseline orientation, motor grossly intact Skin: Skin is warm. No rashes, other new lesions, no LE edema Psychiatric: Pt behavior is normal without agitation  All otherwise neg per pt Lab Results  Component Value Date   WBC 6.1 09/22/2018   HGB 13.6 09/22/2018   HCT 44.2 09/22/2018   PLT 261 09/22/2018   GLUCOSE 126 (H) 08/22/2018   CHOL 147 08/22/2018    TRIG 87.0 08/22/2018   HDL 54.50 08/22/2018   LDLCALC 75 08/22/2018   ALT 23 08/22/2018   AST 20 08/22/2018   NA 141 08/22/2018   K 4.0 08/22/2018   CL 104 08/22/2018   CREATININE 0.97 08/22/2018   BUN 15 08/22/2018   CO2 29 08/22/2018   TSH 0.98 08/22/2018   PSA 0.59 08/22/2018   HGBA1C 6.6 (A) 05/20/2019   MICROALBUR 1.6 06/06/2016         Assessment & Plan:

## 2019-05-26 NOTE — Assessment & Plan Note (Signed)
stable overall by history and exam, recent data reviewed with pt, and pt to continue medical treatment as before,  to f/u any worsening symptoms or concerns  

## 2019-05-26 NOTE — Assessment & Plan Note (Addendum)
Mild to mod, for antibx course,  to f/u any worsening symptoms or concerns  I spent 31 minutes in preparing to see the patient by review of recent labs, imaging and procedures, obtaining and reviewing separately obtained history, communicating with the patient and family or caregiver, ordering medications, tests or procedures, and documenting clinical information in the EHR including the differential Dx, treatment, and any further evaluation and other management of epididymitis, dm, htn

## 2019-05-26 NOTE — Patient Instructions (Signed)
Please take all new medication as prescribed - the antibiotic  Please continue all other medications as before, and refills have been done if requested.  Please have the pharmacy call with any other refills you may need.  Please continue your efforts at being more active, low cholesterol diet, and weight control.  Please keep your appointments with your specialists as you may have planned    

## 2019-05-28 ENCOUNTER — Ambulatory Visit: Payer: 59 | Admitting: Family Medicine

## 2019-06-03 ENCOUNTER — Other Ambulatory Visit: Payer: Self-pay

## 2019-06-03 ENCOUNTER — Encounter: Payer: Self-pay | Admitting: Family Medicine

## 2019-06-03 ENCOUNTER — Ambulatory Visit (INDEPENDENT_AMBULATORY_CARE_PROVIDER_SITE_OTHER): Payer: 59 | Admitting: Family Medicine

## 2019-06-03 VITALS — BP 160/92 | HR 86 | Temp 97.0°F | Ht 68.0 in | Wt 214.0 lb

## 2019-06-03 DIAGNOSIS — Z9989 Dependence on other enabling machines and devices: Secondary | ICD-10-CM

## 2019-06-03 DIAGNOSIS — G4733 Obstructive sleep apnea (adult) (pediatric): Secondary | ICD-10-CM | POA: Diagnosis not present

## 2019-06-03 DIAGNOSIS — G43709 Chronic migraine without aura, not intractable, without status migrainosus: Secondary | ICD-10-CM | POA: Diagnosis not present

## 2019-06-03 MED ORDER — AIMOVIG 140 MG/ML ~~LOC~~ SOAJ: 140.0000 mg | SUBCUTANEOUS | 11 refills | Status: DC

## 2019-06-03 MED ORDER — SUMATRIPTAN SUCCINATE 100 MG PO TABS: ORAL_TABLET | ORAL | 11 refills | Status: DC

## 2019-06-03 NOTE — Patient Instructions (Addendum)
Continue Amovig and sumatriptan as prescribed. Call to follow up with CPAP. Keep a close eye on BP and blood sugars at home. Follow up closely with PCP   Stay well hydrated. Well balanced diet and regular exercise advised.   Follow up in 1 year   Migraine Headache A migraine headache is a very strong throbbing pain on one side or both sides of your head. This type of headache can also cause other symptoms. It can last from 4 hours to 3 days. Talk with your doctor about what things may bring on (trigger) this condition. What are the causes? The exact cause of this condition is not known. This condition may be triggered or caused by:  Drinking alcohol.  Smoking.  Taking medicines, such as: ? Medicine used to treat chest pain (nitroglycerin). ? Birth control pills. ? Estrogen. ? Some blood pressure medicines.  Eating or drinking certain products.  Doing physical activity. Other things that may trigger a migraine headache include:  Having a menstrual period.  Pregnancy.  Hunger.  Stress.  Not getting enough sleep or getting too much sleep.  Weather changes.  Tiredness (fatigue). What increases the risk?  Being 26-63 years old.  Being male.  Having a family history of migraine headaches.  Being Caucasian.  Having depression or anxiety.  Being very overweight. What are the signs or symptoms?  A throbbing pain. This pain may: ? Happen in any area of the head, such as on one side or both sides. ? Make it hard to do daily activities. ? Get worse with physical activity. ? Get worse around bright lights or loud noises.  Other symptoms may include: ? Feeling sick to your stomach (nauseous). ? Vomiting. ? Dizziness. ? Being sensitive to bright lights, loud noises, or smells.  Before you get a migraine headache, you may get warning signs (an aura). An aura may include: ? Seeing flashing lights or having blind spots. ? Seeing bright spots, halos, or zigzag  lines. ? Having tunnel vision or blurred vision. ? Having numbness or a tingling feeling. ? Having trouble talking. ? Having weak muscles.  Some people have symptoms after a migraine headache (postdromal phase), such as: ? Tiredness. ? Trouble thinking (concentrating). How is this treated?  Taking medicines that: ? Relieve pain. ? Relieve the feeling of being sick to your stomach. ? Prevent migraine headaches.  Treatment may also include: ? Having acupuncture. ? Avoiding foods that bring on migraine headaches. ? Learning ways to control your body functions (biofeedback). ? Therapy to help you know and deal with negative thoughts (cognitive behavioral therapy). Follow these instructions at home: Medicines  Take over-the-counter and prescription medicines only as told by your doctor.  Ask your doctor if the medicine prescribed to you: ? Requires you to avoid driving or using heavy machinery. ? Can cause trouble pooping (constipation). You may need to take these steps to prevent or treat trouble pooping:  Drink enough fluid to keep your pee (urine) pale yellow.  Take over-the-counter or prescription medicines.  Eat foods that are high in fiber. These include beans, whole grains, and fresh fruits and vegetables.  Limit foods that are high in fat and sugar. These include fried or sweet foods. Lifestyle  Do not drink alcohol.  Do not use any products that contain nicotine or tobacco, such as cigarettes, e-cigarettes, and chewing tobacco. If you need help quitting, ask your doctor.  Get at least 8 hours of sleep every night.  Limit and deal  with stress. General instructions      Keep a journal to find out what may bring on your migraine headaches. For example, write down: ? What you eat and drink. ? How much sleep you get. ? Any change in what you eat or drink. ? Any change in your medicines.  If you have a migraine headache: ? Avoid things that make your symptoms  worse, such as bright lights. ? It may help to lie down in a dark, quiet room. ? Do not drive or use heavy machinery. ? Ask your doctor what activities are safe for you.  Keep all follow-up visits as told by your doctor. This is important. Contact a doctor if:  You get a migraine headache that is different or worse than others you have had.  You have more than 15 headache days in one month. Get help right away if:  Your migraine headache gets very bad.  Your migraine headache lasts longer than 72 hours.  You have a fever.  You have a stiff neck.  You have trouble seeing.  Your muscles feel weak or like you cannot control them.  You start to lose your balance a lot.  You start to have trouble walking.  You pass out (faint).  You have a seizure. Summary  A migraine headache is a very strong throbbing pain on one side or both sides of your head. These headaches can also cause other symptoms.  This condition may be treated with medicines and changes to your lifestyle.  Keep a journal to find out what may bring on your migraine headaches.  Contact a doctor if you get a migraine headache that is different or worse than others you have had.  Contact your doctor if you have more than 15 headache days in a month. This information is not intended to replace advice given to you by your health care provider. Make sure you discuss any questions you have with your health care provider. Document Revised: 05/16/2018 Document Reviewed: 03/06/2018 Elsevier Patient Education  [place].
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
## 2019-06-05 ENCOUNTER — Encounter: Payer: Self-pay | Admitting: Family Medicine

## 2019-06-05 ENCOUNTER — Ambulatory Visit (INDEPENDENT_AMBULATORY_CARE_PROVIDER_SITE_OTHER): Payer: 59 | Admitting: Family Medicine

## 2019-06-05 ENCOUNTER — Other Ambulatory Visit: Payer: Self-pay

## 2019-06-05 DIAGNOSIS — M94261 Chondromalacia, right knee: Secondary | ICD-10-CM | POA: Diagnosis not present

## 2019-06-05 NOTE — Assessment & Plan Note (Signed)
Patient is doing relatively well with the conservative therapy.  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  Has topical anti-inflammatories, icing regimen, which activities to do which was to avoid.  Patient will increase activity slowly over the course the next several weeks.  Follow-up with me again in 4 to 8 weeks

## 2019-06-05 NOTE — Patient Instructions (Signed)
Compression sleeve with work [employer] or [employer] See me in 2-3 months Continue exercises

## 2019-06-05 NOTE — Progress Notes (Signed)
[place] Phone: [phone] Subjective:   [name], am serving as a scribe for Dr. Hulan Saas. This visit occurred during the SARS-CoV-2 public health emergency.  Safety protocols were in place, including screening questions prior to the visit, additional usage of staff PPE, and extensive cleaning of exam room while observing appropriate contact time as indicated for disinfecting solutions.   I'm seeing this patient by the request  of:  Biagio Borg, MD  CC: Right knee pain follow-up  [id number]   01/21/2019 History of chondromalacia and likely now more of a popliteal tendinitis.  Encourage patient to continue the brace from time to time and we discussed the possibility of injection with patient declined.  Patient is good to work on more hamstring strengthening and can ice them popliteal tenderness playing more of a role at the moment.  Patient is to do topical anti-inflammatories and icing regimen.  Follow-up again in 4 to 8 weeks mild limitations at work at the moment.  Update 06/05/2019 Charli Liberatore is a 54 y.o. male coming in with complaint of right knee pain. Patient states that he had a pop in his knee last week which relieved his pain. Having less pain but notes swelling in posterior aspect of knee. Feels like his knee is weak.  Patient would state that he is feeling 50 to 60% better at this time.     Past Medical History:  Diagnosis Date  . Allergy   . Anemia   . Arthritis   . Cataract    bilateral cataracts removed  . Depression   . Diabetes mellitus   . Frequent headaches   . GERD (gastroesophageal reflux disease)   . Glaucoma   . Hypercholesteremia   . Hypertension   . Migraines   . Sleep apnea    wears c-PAP   Past Surgical History:  Procedure Laterality Date  . bilateral cataracts removed    . CARDIAC CATHETERIZATION  12/2011  . COLONOSCOPY    . LEFT HEART CATHETERIZATION  WITH CORONARY ANGIOGRAM N/A 11/12/2011   Procedure: LEFT HEART CATHETERIZATION WITH CORONARY ANGIOGRAM;  Surgeon: Laverda Page, MD;  Location: [facility] CATH LAB;  Service: Cardiovascular;  Laterality: N/A;  . SHOULDER SURGERY     Social History   Socioeconomic History  . Marital status: Married    Spouse name: Not on file  . Number of children: 2  . Years of education: 64  . Highest education level: Not on file  Occupational History  . Occupation: Stocker  Tobacco Use  . Smoking status: Never Smoker  . Smokeless tobacco: Never Used  Substance and Sexual Activity  . Alcohol use: Yes    Comment: occasional  . Drug use: No  . Sexual activity: Yes    Birth control/protection: None  Other Topics Concern  . Not on file  Social History Narrative   Fun/Hobby: Leisure centre manager football   Social Determinants of Health   Financial Resource Strain:   . Difficulty of Paying Living Expenses:   Food Insecurity:   . Worried About Charity fundraiser in the Last Year:   . Arboriculturist in the Last Year:   Transportation Needs:   . Film/video editor (Medical):   [name] Lack of Transportation (Non-Medical):   Physical Activity:   . Days of Exercise per Week:   . Minutes of Exercise per Session:   Stress:   . Feeling of Stress :  Social Connections:   . Frequency of Communication with Friends and Family:   . Frequency of Social Gatherings with Friends and Family:   . Attends Religious Services:   . Active Member of Clubs or Organizations:   . Attends Archivist Meetings:   [name] Marital Status:    Allergies  Allergen Reactions  . Pollen Extract Other (See Comments)    Stuffy nose   Family History  Problem Relation Age of Onset  . Arthritis Mother   . Stroke Mother   . Hypertension Father   . Diabetes Father   . Cancer Father        Multiple Myleoma  . Diabetes Maternal Grandfather   . Colon cancer Neg Hx   . Esophageal cancer Neg Hx   . Rectal cancer Neg Hx   . Stomach  cancer Neg Hx     Current Outpatient Medications (Endocrine & Metabolic):  .  dapagliflozin propanediol (FARXIGA) 10 MG TABS tablet, Take 10 mg by mouth daily. .  metFORMIN (GLUCOPHAGE) 1000 MG tablet, Take 1 tablet (1,000 mg total) by mouth 2 (two) times daily with a meal. .  Semaglutide, 1 MG/DOSE, (OZEMPIC, 1 MG/DOSE,) 2 MG/1.5ML SOPN, Inject 1 mg into the skin once a week.  Current Outpatient Medications (Cardiovascular):  .  atorvastatin (LIPITOR) 40 MG tablet, TAKE 1 TABLET BY MOUTH EVERY DAY .  losartan-hydrochlorothiazide (HYZAAR) 100-25 MG tablet, Take 1 tablet by mouth daily. .  sildenafil (REVATIO) 20 MG tablet, Take 20 mg by mouth. 3-5 tabs prn .  verapamil (CALAN-SR) 180 MG CR tablet, Take 1 tablet (180 mg total) by mouth daily.   Current Outpatient Medications (Analgesics):  .  aspirin 81 MG chewable tablet, CHEW 1 TABLET (81 MG TOTAL) BY MOUTH DAILY. [name]  diclofenac (VOLTAREN) 75 MG EC tablet, Take 1 tablet (75 mg total) by mouth 2 (two) times daily. Eduard Roux (AIMOVIG) 140 MG/ML SOAJ, Inject 140 mg into the skin every 30 (thirty) days. [name]  ibuprofen (ADVIL,MOTRIN) 600 MG tablet, TAKE 1 TABLET(600 MG) BY MOUTH EVERY 8 HOURS AS NEEDED .  meloxicam (MOBIC) 15 MG tablet, Take 1 tablet (15 mg total) by mouth daily. .  SUMAtriptan (IMITREX) 100 MG tablet, Take 1 tablet at onset of migraine. May repeat in 2 hours if headache persists or recurs. Do not exceed 2 tablets in 24 hours or 10 tablets per month.   Current Outpatient Medications (Other):  .  baclofen (LIORESAL) 10 MG tablet, Take 1 tablet (10 mg total) by mouth 3 (three) times daily. Prn for IBS flare .  Blood Glucose Monitoring Suppl (ONETOUCH VERIO) w/Device KIT, 1 Device by Does not apply route 2 (two) times daily as needed. .  Diclofenac Sodium (PENNSAID) 2 % SOLN, Place 2 g onto the skin 2 (two) times daily. .  diclofenac sodium (VOLTAREN) 1 % GEL, Apply 2 g topically 4 (four) times daily. [name]  dicyclomine  (BENTYL) 10 MG capsule, TAKE 1 CAPSULE (10 MG TOTAL) BY MOUTH 4 (FOUR) TIMES DAILY - BEFORE MEALS AND AT BEDTIME. [name]  doxycycline (VIBRA-TABS) 100 MG tablet, Take 1 tablet (100 mg total) by mouth 2 (two) times daily. [name]  gabapentin (NEURONTIN) 600 MG tablet, Take 600 mg by mouth as needed.  .  methocarbamol (ROBAXIN) 500 MG tablet, TAKE 1 TABLET BY MOUTH TWICE DAILY (Patient taking differently: Take 500 mg by mouth every 6 (six) hours as needed. ) .  omeprazole (PRILOSEC) 40 MG capsule, TAKE 1 CAPSULE (40 MG TOTAL)  BY MOUTH DAILY AS NEEDED (INDIGESTION). [name]  PENNSAID 2 % SOLN, SMARTSIG:2 Pump Topical Twice Daily .  timolol (TIMOPTIC-XR) 0.5 % ophthalmic gel-forming, Place 1 drop into both eyes daily.  .  Vitamin D, Ergocalciferol, (DRISDOL) 1.25 MG (50000 UT) CAPS capsule, Take 1 capsule (50,000 Units total) by mouth every 7 (seven) days.   Reviewed prior external information including notes and imaging from  primary care provider As well as notes that were available from care everywhere and other healthcare systems.  Past medical history, social, surgical and family history all reviewed in electronic medical record.  No pertanent information unless stated regarding to the chief complaint.   Review of Systems:  No headache, visual changes, nausea, vomiting, diarrhea, constipation, dizziness, abdominal pain, skin rash, fevers, chills, night sweats, weight loss, swollen lymph nodes, body aches, joint swelling, chest pain, shortness of breath, mood changes. POSITIVE muscle aches  Objective  Blood pressure (!) 142/86, pulse 82, height _0  (1.727 m), weight 216 lb (98 kg), SpO2 98 %.   General: No apparent distress alert and oriented x3 mood and affect normal, dressed appropriately.  HEENT: Pupils equal, extraocular movements intact  Respiratory: Patient's speak in full sentences and does not appear short of breath  Cardiovascular: No lower extremity edema, non tender, no erythema  Neuro: Cranial  nerves II through XII are intact, neurovascularly intact in all extremities with 2+ DTRs and 2+ pulses.  Gait normal with good balance and coordination.  MSK: Right knee on exam does not have any swelling.  Patient has significant improvement in strength of the hamstrings noted and nontender over the popliteal area.  Still mild patella grind test noted.  5-5 strength over the lower extremity.   Impression and Recommendations:     The above documentation has been reviewed and is accurate and complete Lyndal Pulley, DO       Note: This dictation was prepared with Dragon dictation along with smaller phrase technology. Any transcriptional errors that result from this process are unintentional.

## 2019-06-23 ENCOUNTER — Telehealth: Payer: Self-pay | Admitting: Family Medicine

## 2019-06-23 ENCOUNTER — Telehealth: Payer: Self-pay | Admitting: Internal Medicine

## 2019-06-23 NOTE — Telephone Encounter (Signed)
Pt LVM stating he is still having migraines and would like a CB from RN to discuss

## 2019-06-23 NOTE — Telephone Encounter (Signed)
New message:   Pt is calling and states he would like to talk to the Dr. Rock Nephew would not disclose to me what he needs to speak to the Dr about. Please advise.

## 2019-06-23 NOTE — Telephone Encounter (Signed)
I spoke to pt and he states that he is having daily headaches since the weather got colder, with 4 migraines and last Monday had to skip work.  Some nausea.  Sumatriptan he does not feel that it is working already taken 8 tabs , if needs new batch use [facility] not [facility].  Please advise.

## 2019-06-24 MED ORDER — RIZATRIPTAN BENZOATE 10 MG PO TBDP: 10.0000 mg | ORAL_TABLET | ORAL | 11 refills | Status: DC | PRN

## 2019-06-24 NOTE — Telephone Encounter (Signed)
Spoke to pt and relayed that per AL/NP he could try rizatriptan and will send to either pharmacy.  Do not use both sumatriptan and rizatriptan together.  Use same way as the sumatriptan (2 tab max/ prn).  If not any better to call us back and will get back in.  Also could be allergies may try antihistamine to see if helps.  He verbalized understanding.

## 2019-07-08 ENCOUNTER — Encounter: Payer: Self-pay | Admitting: Internal Medicine

## 2019-07-15 ENCOUNTER — Encounter: Payer: Self-pay | Admitting: Family Medicine

## 2019-07-15 ENCOUNTER — Other Ambulatory Visit: Payer: Self-pay

## 2019-07-15 ENCOUNTER — Ambulatory Visit (INDEPENDENT_AMBULATORY_CARE_PROVIDER_SITE_OTHER): Payer: 59 | Admitting: Family Medicine

## 2019-07-15 ENCOUNTER — Ambulatory Visit: Payer: Self-pay

## 2019-07-15 VITALS — BP 138/78 | HR 76 | Ht 68.0 in | Wt 213.2 lb

## 2019-07-15 DIAGNOSIS — M25532 Pain in left wrist: Secondary | ICD-10-CM

## 2019-07-15 DIAGNOSIS — M654 Radial styloid tenosynovitis [de Quervain]: Secondary | ICD-10-CM | POA: Diagnosis not present

## 2019-07-15 NOTE — Patient Instructions (Signed)
Thank you for coming in today. Plan for thumb spica wrist brace.  Continue pennsaid or over the counter voltaren gel.  Attend hand PT.  If not improving recheck for injection.   de quervain

## 2019-07-15 NOTE — Progress Notes (Signed)
° °  I, Christoper Fabian, LAT, ATC, am serving as scribe for Dr. Clementeen Graham.  Jason Ortega is a 54 y.o. male who presents to [facility] today for L wrist pain.  He was last seen by Dr. Katrinka Blazing on 06/05/19 for his R knee.  Today, he notes L wrist pain x 2 weeks w/ no known MOI.  He locates his pain to his R radial wrist.  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  Radiating pain: No L wrist mechanical symptoms: No L wrist swelling: No Aggravating factors: L wrist flexion and RD; gripping Treatments tried: wrist brace; Pennsaid; IBU   Pertinent review of systems: No fevers or chills  Relevant historical information: Diabetes   Exam:  BP 138/78 (BP Location: Left Arm, Patient Position: Sitting, Cuff Size: Large)    Pulse 76    Ht 5\' 8"  (1.727 m)    Wt 213 lb 3.2 oz (96.7 kg)    SpO2 96%    BMI 32.42 kg/m  General: Well Developed, well nourished, and in no acute distress.   MSK: Left wrist normal-appearing Tender palpation radial styloid. Normal wrist motion some pain with radial and ulnar deviation. Strength is intact. Positive Finkelstein's test. Pulses capillary fill and sensation are intact distally.    Lab and Radiology Results Diagnostic Limited MSK Ultrasound of: Left wrist First dorsal wrist compartment intact tendon however increased hypoechoic fluid and tendon sheath consistent with de Quervain's tenosynovitis Impression: De Quervain's tenosynovitis      Assessment and Plan: 54 y.o. male with left wrist pain due to de Quervain's tenosynovitis.  Plan to treat with thumb spica splint and continued Pennsaid.  Also recommend home exercise program.  Will refer to hand physical therapy.  Check back in 4 to 6 weeks if not improving.  At that point would consider steroid injection.  Patient has a follow-up appointment scheduled with Dr. 53 at the end of July.  Happy to see patient sooner if  needed.   PDMP not reviewed this encounter. Orders Placed This Encounter  Procedures   August LIMITED JOINT SPACE STRUCTURES UP LEFT(NO LINKED CHARGES)    Order Specific Question:   Reason for Exam (SYMPTOM  OR DIAGNOSIS REQUIRED)    Answer:   L wrist pain    Order Specific Question:   Preferred imaging location?    Answer:   [facility] referral to Physical Therapy    Referral Priority:   Routine    Referral Type:   Physical Medicine    Referral Reason:   Specialty Services Required    Requested Specialty:   Physical Therapy    Number of Visits Requested:   1   No orders of the defined types were placed in this encounter.    Discussed warning signs or symptoms. Please see discharge instructions. Patient expresses understanding.   The above documentation has been reviewed and is accurate and complete [facility], M.D.

## 2019-08-08 ENCOUNTER — Other Ambulatory Visit: Payer: Self-pay | Admitting: Internal Medicine

## 2019-08-08 NOTE — Telephone Encounter (Signed)
Please refill as per office routine med refill policy (all routine meds refilled for 3 mo or monthly per pt preference up to one year from last visit, then month to month grace period for 3 mo, then further med refills will have to be denied)  

## 2019-08-09 NOTE — Telephone Encounter (Signed)
Please refill as per office routine med refill policy (all routine meds refilled for 3 mo or monthly per pt preference up to one year from last visit, then month to month grace period for 3 mo, then further med refills will have to be denied)  

## 2019-08-31 ENCOUNTER — Encounter: Payer: Self-pay | Admitting: Family Medicine

## 2019-09-01 ENCOUNTER — Ambulatory Visit (INDEPENDENT_AMBULATORY_CARE_PROVIDER_SITE_OTHER): Payer: 59 | Admitting: Internal Medicine

## 2019-09-01 ENCOUNTER — Ambulatory Visit (INDEPENDENT_AMBULATORY_CARE_PROVIDER_SITE_OTHER): Payer: 59 | Admitting: Family Medicine

## 2019-09-01 ENCOUNTER — Encounter: Payer: Self-pay | Admitting: Family Medicine

## 2019-09-01 ENCOUNTER — Encounter: Payer: Self-pay | Admitting: Internal Medicine

## 2019-09-01 ENCOUNTER — Other Ambulatory Visit: Payer: Self-pay

## 2019-09-01 VITALS — BP 150/90 | HR 75 | Temp 98.0°F | Ht 68.0 in | Wt 216.0 lb

## 2019-09-01 DIAGNOSIS — Z0000 Encounter for general adult medical examination without abnormal findings: Secondary | ICD-10-CM

## 2019-09-01 DIAGNOSIS — E119 Type 2 diabetes mellitus without complications: Secondary | ICD-10-CM | POA: Diagnosis not present

## 2019-09-01 DIAGNOSIS — Z0001 Encounter for general adult medical examination with abnormal findings: Secondary | ICD-10-CM

## 2019-09-01 DIAGNOSIS — D508 Other iron deficiency anemias: Secondary | ICD-10-CM | POA: Diagnosis not present

## 2019-09-01 DIAGNOSIS — M94261 Chondromalacia, right knee: Secondary | ICD-10-CM | POA: Diagnosis not present

## 2019-09-01 DIAGNOSIS — E559 Vitamin D deficiency, unspecified: Secondary | ICD-10-CM

## 2019-09-01 DIAGNOSIS — Z309 Encounter for contraceptive management, unspecified: Secondary | ICD-10-CM | POA: Insufficient documentation

## 2019-09-01 DIAGNOSIS — Z302 Encounter for sterilization: Secondary | ICD-10-CM | POA: Diagnosis not present

## 2019-09-01 NOTE — Patient Instructions (Signed)
Please see me again in 2 month

## 2019-09-01 NOTE — Patient Instructions (Signed)
Please take OTC Vitamin D3 at 2000 units per day, indefinitely  Please continue all other medications as before, and refills have been done if requested.  Please have the pharmacy call with any other refills you may need.  Please continue your efforts at being more active, low cholesterol diet, and weight control.  You are otherwise up to date with prevention measures today.  Please keep your appointments with your specialists as you may have planned  Please go to the LAB at the blood drawing area for the tests to be done  You will be contacted by phone if any changes need to be made immediately.  Otherwise, you will receive a letter about your results with an explanation, but please check with MyChart first.  Please remember to sign up for MyChart if you have not done so, as this will be important to you in the future with finding out test results, communicating by private email, and scheduling acute appointments online when needed.  Please make an Appointment to return in 6 months, or sooner if needed 

## 2019-09-01 NOTE — Assessment & Plan Note (Signed)
Patient states 80 to 90% better.  Still noticing discomfort with walking greater than 2 hours at a time.  Is no longer using the brace at the moment.  I believe patient will likely do well if he does increase his activity.  Can continue to give him some mild restrictions at work if necessary but I believe patient should do well.  Follow-up with me again in 4 to 8 weeks

## 2019-09-01 NOTE — Progress Notes (Signed)
Subjective:    Patient ID: Jason Ortega, male    DOB: [birth year], 54 y.o.   MRN: [id number]  HPI  Here for wellness and f/u;  Overall doing ok;  Pt denies Chest pain, worsening SOB, DOE, wheezing, orthopnea, PND, worsening LE edema, palpitations, dizziness or syncope.  Pt denies neurological change such as new headache, facial or extremity weakness.  Pt denies polydipsia, polyuria, or low sugar symptoms. Pt states overall good compliance with treatment and medications, good tolerability, and has been trying to follow appropriate diet.  Pt denies worsening depressive symptoms, suicidal ideation or panic. No fever, night sweats, wt loss, loss of appetite, or other constitutional symptoms.  Pt states good ability with ADL's, has low fall risk, home safety reviewed and adequate, no other significant changes in hearing or vision, and only occasionally active with exercise.  No new complaints Past Medical History:  Diagnosis Date  . Allergy   . Anemia   . Arthritis   . Cataract    bilateral cataracts removed  . Depression   . Diabetes mellitus   . Frequent headaches   . GERD (gastroesophageal reflux disease)   . Glaucoma   . Hypercholesteremia   . Hypertension   . Migraines   . Sleep apnea    wears c-PAP   Past Surgical History:  Procedure Laterality Date  . bilateral cataracts removed    . CARDIAC CATHETERIZATION  12/2011  . COLONOSCOPY    . LEFT HEART CATHETERIZATION WITH CORONARY ANGIOGRAM N/A 11/12/2011   Procedure: LEFT HEART CATHETERIZATION WITH CORONARY ANGIOGRAM;  Surgeon: Laverda Page, MD;  Location: [facility] CATH LAB;  Service: Cardiovascular;  Laterality: N/A;  . SHOULDER SURGERY      reports that he has never smoked. He has never used smokeless tobacco. He reports current alcohol use. He reports that he does not use drugs. family history includes Arthritis in his mother; Cancer in his father; Diabetes in his father and maternal grandfather; Hypertension in his father; Stroke in  his mother. Allergies  Allergen Reactions  . Pollen Extract Other (See Comments)    Stuffy nose   Current Outpatient Medications on File Prior to Visit  Medication Sig Dispense Refill  . aspirin 81 MG chewable tablet CHEW 1 TABLET (81 MG TOTAL) BY MOUTH DAILY. 36 tablet 0  . atorvastatin (LIPITOR) 40 MG tablet TAKE 1 TABLET BY MOUTH EVERY DAY 30 tablet 0  . baclofen (LIORESAL) 10 MG tablet Take 1 tablet (10 mg total) by mouth 3 (three) times daily. Prn for IBS flare 30 each 0  . Blood Glucose Monitoring Suppl (ONETOUCH VERIO) w/Device KIT 1 Device by Does not apply route 2 (two) times daily as needed. 1 kit 0  . dapagliflozin propanediol (FARXIGA) 10 MG TABS tablet Take 10 mg by mouth daily. 90 tablet 3  . Diclofenac Sodium (PENNSAID) 2 % SOLN Place 2 g onto the skin 2 (two) times daily. 112 g 3  . diclofenac sodium (VOLTAREN) 1 % GEL Apply 2 g topically 4 (four) times daily. 200 g 5  . dicyclomine (BENTYL) 10 MG capsule TAKE 1 CAPSULE (10 MG TOTAL) BY MOUTH 4 (FOUR) TIMES DAILY - BEFORE MEALS AND AT BEDTIME. 90 capsule 3  . Erenumab-aooe (AIMOVIG) 140 MG/ML SOAJ Inject 140 mg into the skin every 30 (thirty) days. 1 pen 11  . gabapentin (NEURONTIN) 600 MG tablet Take 600 mg by mouth as needed.     [name] ibuprofen (ADVIL,MOTRIN) 600 MG tablet TAKE 1 TABLET(600 MG) BY  MOUTH EVERY 8 HOURS AS NEEDED 30 tablet 0  . losartan-hydrochlorothiazide (HYZAAR) 100-25 MG tablet TAKE 1 TABLET BY MOUTH EVERY DAY 30 tablet 0  . meloxicam (MOBIC) 15 MG tablet Take 1 tablet (15 mg total) by mouth daily. 30 tablet 5  . metFORMIN (GLUCOPHAGE) 1000 MG tablet Take 1 tablet (1,000 mg total) by mouth 2 (two) times daily with a meal. 180 tablet 3  . methocarbamol (ROBAXIN) 500 MG tablet TAKE 1 TABLET BY MOUTH TWICE DAILY (Patient taking differently: Take 500 mg by mouth every 6 (six) hours as needed. ) 20 tablet 0  . omeprazole (PRILOSEC) 40 MG capsule TAKE 1 CAPSULE (40 MG TOTAL) BY MOUTH DAILY AS NEEDED (INDIGESTION). 30  capsule 0  . PENNSAID 2 % SOLN SMARTSIG:2 Pump Topical Twice Daily    . rizatriptan (MAXALT-MLT) 10 MG disintegrating tablet Take 1 tablet (10 mg total) by mouth as needed for migraine. May repeat in 2 hours if needed 9 tablet 11  . Semaglutide, 1 MG/DOSE, (OZEMPIC, 1 MG/DOSE,) 2 MG/1.5ML SOPN Inject 1 mg into the skin once a week. 12 pen 3  . sildenafil (REVATIO) 20 MG tablet Take 20 mg by mouth. 3-5 tabs prn    . SUMAtriptan (IMITREX) 100 MG tablet Take 1 tablet at onset of migraine. May repeat in 2 hours if headache persists or recurs. Do not exceed 2 tablets in 24 hours or 10 tablets per month. 10 tablet 11  . timolol (TIMOPTIC-XR) 0.5 % ophthalmic gel-forming Place 1 drop into both eyes daily.     . verapamil (CALAN-SR) 180 MG CR tablet Take 1 tablet (180 mg total) by mouth daily. 90 tablet 1   No current facility-administered medications on file prior to visit.   Review of Systems All otherwise neg per pt     Objective:   Physical Exam BP (!) 150/90 (BP Location: Left Arm, Patient Position: Sitting, Cuff Size: Large)   Pulse 75   Temp 98 F (36.7 C) (Oral)   Ht _0  (1.727 m)   Wt (!) 216 lb (98 kg)   SpO2 96%   BMI 32.84 kg/m  VS noted,  Constitutional: Pt appears in NAD HENT: Head: NCAT.  Right Ear: External ear normal.  Left Ear: External ear normal.  Eyes: . Pupils are equal, round, and reactive to light. Conjunctivae and EOM are normal Nose: without d/c or deformity Neck: Neck supple. Gross normal ROM Cardiovascular: Normal rate and regular rhythm.   Pulmonary/Chest: Effort normal and breath sounds without rales or wheezing.  Abd:  Soft, NT, ND, + BS, no organomegaly Neurological: Pt is alert. At baseline orientation, motor grossly intact Skin: Skin is warm. No rashes, other new lesions, no LE edema Psychiatric: Pt behavior is normal without agitation  All otherwise neg per pt  Lab Results  Component Value Date   WBC 4.3 09/01/2019   HGB 13.6 09/01/2019   HCT  43.1 09/01/2019   PLT 266 09/01/2019   GLUCOSE 122 (H) 09/01/2019   CHOL 143 09/01/2019   TRIG 54 09/01/2019   HDL 57 09/01/2019   LDLCALC 73 09/01/2019   ALT 40 09/01/2019   AST 23 09/01/2019   NA 140 09/01/2019   K 3.9 09/01/2019   CL 101 09/01/2019   CREATININE 1.02 09/01/2019   BUN 14 09/01/2019   CO2 27 09/01/2019   TSH 0.89 09/01/2019   PSA 0.7 09/01/2019   HGBA1C 6.8 (H) 09/01/2019   MICROALBUR 4.6 09/01/2019      Assessment &  Plan:

## 2019-09-01 NOTE — Progress Notes (Signed)
[facility] [place] Phone: [phone] Subjective:   [name], am serving as a scribe for Dr. Hulan Saas. This visit occurred during the SARS-CoV-2 public health emergency.  Safety protocols were in place, including screening questions prior to the visit, additional usage of staff PPE, and extensive cleaning of exam room while observing appropriate contact time as indicated for disinfecting solutions.   I'm seeing this patient by the request  of:  Biagio Borg, MD  CC: knee pain   [id number]   06/05/2019 Patient is doing relatively well with the conservative therapy.  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  Has topical anti-inflammatories, icing regimen, which activities to do which was to avoid.  Patient will increase activity slowly over the course the next several weeks.  Follow-up with me again in 4 to 8 weeks  Update 09/01/2019 Jason Ortega is a 54 y.o. male coming in with complaint of right knee pain. Patient states that his pain is improving. Still having pain with prolonged walking. Will have increase in pain for 2 days. Using ice and elevating leg when painful.        Past Medical History:  Diagnosis Date  . Allergy   . Anemia   . Arthritis   . Cataract    bilateral cataracts removed  . Depression   . Diabetes mellitus   . Frequent headaches   . GERD (gastroesophageal reflux disease)   . Glaucoma   . Hypercholesteremia   . Hypertension   . Migraines   . Sleep apnea    wears c-PAP   Past Surgical History:  Procedure Laterality Date  . bilateral cataracts removed    . CARDIAC CATHETERIZATION  12/2011  . COLONOSCOPY    . LEFT HEART CATHETERIZATION WITH CORONARY ANGIOGRAM N/A 11/12/2011   Procedure: LEFT HEART CATHETERIZATION WITH CORONARY ANGIOGRAM;  Surgeon: Laverda Page, MD;  Location: [facility] CATH LAB;  Service: Cardiovascular;   Laterality: N/A;  . SHOULDER SURGERY     Social History   Socioeconomic History  . Marital status: Married    Spouse name: Not on file  . Number of children: 2  . Years of education: 51  . Highest education level: Not on file  Occupational History  . Occupation: Stocker  Tobacco Use  . Smoking status: Never Smoker  . Smokeless tobacco: Never Used  Vaping Use  . Vaping Use: Never used  Substance and Sexual Activity  . Alcohol use: Yes    Comment: occasional  . Drug use: No  . Sexual activity: Yes    Birth control/protection: None  Other Topics Concern  . Not on file  Social History Narrative   Fun/Hobby: Leisure centre manager football   Social Determinants of Health   Financial Resource Strain:   . Difficulty of Paying Living Expenses:   Food Insecurity:   . Worried About Charity fundraiser in the Last Year:   . Arboriculturist in the Last Year:   Transportation Needs:   . Film/video editor (Medical):   [name] Lack of Transportation (Non-Medical):   Physical Activity:   . Days of Exercise per Week:   . Minutes of Exercise per Session:   Stress:   . Feeling of Stress :   Social Connections:   . Frequency of Communication with Friends and Family:   . Frequency of Social Gatherings with Friends and Family:   .  Attends Religious Services:   . Active Member of Clubs or Organizations:   . Attends Archivist Meetings:   [name] Marital Status:    Allergies  Allergen Reactions  . Pollen Extract Other (See Comments)    Stuffy nose   Family History  Problem Relation Age of Onset  . Arthritis Mother   . Stroke Mother   . Hypertension Father   . Diabetes Father   . Cancer Father        Multiple Myleoma  . Diabetes Maternal Grandfather   . Colon cancer Neg Hx   . Esophageal cancer Neg Hx   . Rectal cancer Neg Hx   . Stomach cancer Neg Hx     Current Outpatient Medications (Endocrine & Metabolic):  .  dapagliflozin propanediol (FARXIGA) 10 MG TABS tablet, Take 10 mg by  mouth daily. .  metFORMIN (GLUCOPHAGE) 1000 MG tablet, Take 1 tablet (1,000 mg total) by mouth 2 (two) times daily with a meal. .  Semaglutide, 1 MG/DOSE, (OZEMPIC, 1 MG/DOSE,) 2 MG/1.5ML SOPN, Inject 1 mg into the skin once a week.  Current Outpatient Medications (Cardiovascular):  .  atorvastatin (LIPITOR) 40 MG tablet, TAKE 1 TABLET BY MOUTH EVERY DAY .  losartan-hydrochlorothiazide (HYZAAR) 100-25 MG tablet, TAKE 1 TABLET BY MOUTH EVERY DAY .  sildenafil (REVATIO) 20 MG tablet, Take 20 mg by mouth. 3-5 tabs prn .  verapamil (CALAN-SR) 180 MG CR tablet, Take 1 tablet (180 mg total) by mouth daily.   Current Outpatient Medications (Analgesics):  .  aspirin 81 MG chewable tablet, CHEW 1 TABLET (81 MG TOTAL) BY MOUTH DAILY. [name]  Erenumab-aooe (AIMOVIG) 140 MG/ML SOAJ, Inject 140 mg into the skin every 30 (thirty) days. [name]  ibuprofen (ADVIL,MOTRIN) 600 MG tablet, TAKE 1 TABLET(600 MG) BY MOUTH EVERY 8 HOURS AS NEEDED .  meloxicam (MOBIC) 15 MG tablet, Take 1 tablet (15 mg total) by mouth daily. .  rizatriptan (MAXALT-MLT) 10 MG disintegrating tablet, Take 1 tablet (10 mg total) by mouth as needed for migraine. May repeat in 2 hours if needed .  SUMAtriptan (IMITREX) 100 MG tablet, Take 1 tablet at onset of migraine. May repeat in 2 hours if headache persists or recurs. Do not exceed 2 tablets in 24 hours or 10 tablets per month.   Current Outpatient Medications (Other):  .  baclofen (LIORESAL) 10 MG tablet, Take 1 tablet (10 mg total) by mouth 3 (three) times daily. Prn for IBS flare .  Blood Glucose Monitoring Suppl (ONETOUCH VERIO) w/Device KIT, 1 Device by Does not apply route 2 (two) times daily as needed. .  Diclofenac Sodium (PENNSAID) 2 % SOLN, Place 2 g onto the skin 2 (two) times daily. .  diclofenac sodium (VOLTAREN) 1 % GEL, Apply 2 g topically 4 (four) times daily. [name]  dicyclomine (BENTYL) 10 MG capsule, TAKE 1 CAPSULE (10 MG TOTAL) BY MOUTH 4 (FOUR) TIMES DAILY - BEFORE MEALS AND AT  BEDTIME. [name]  gabapentin (NEURONTIN) 600 MG tablet, Take 600 mg by mouth as needed.  .  methocarbamol (ROBAXIN) 500 MG tablet, TAKE 1 TABLET BY MOUTH TWICE DAILY (Patient taking differently: Take 500 mg by mouth every 6 (six) hours as needed. ) .  omeprazole (PRILOSEC) 40 MG capsule, TAKE 1 CAPSULE (40 MG TOTAL) BY MOUTH DAILY AS NEEDED (INDIGESTION). [name]  PENNSAID 2 % SOLN, SMARTSIG:2 Pump Topical Twice Daily .  timolol (TIMOPTIC-XR) 0.5 % ophthalmic gel-forming, Place 1 drop into both eyes daily.    Reviewed prior  external information including notes and imaging from  primary care provider As well as notes that were available from care everywhere and other healthcare systems.  Past medical history, social, surgical and family history all reviewed in electronic medical record.  No pertanent information unless stated regarding to the chief complaint.   Review of Systems:  No headache, visual changes, nausea, vomiting, diarrhea, constipation, dizziness, abdominal pain, skin rash, fevers, chills, night sweats, weight loss, swollen lymph nodes, body aches, joint swelling, chest pain, shortness of breath, mood changes. POSITIVE muscle aches  Objective  Blood pressure (!) 132/84, pulse 79, height '5\' 8"'$  (1.727 m), weight (!) 217 lb (98.4 kg), SpO2 98 %.   General: No apparent distress alert and oriented x3 mood and affect normal, dressed appropriately.  HEENT: Pupils equal, extraocular movements intact  Respiratory: Patient's speak in full sentences and does not appear short of breath  Cardiovascular: No lower extremity edema, non tender, no erythema  Neuro: Cranial nerves II through XII are intact, neurovascularly intact in all extremities with 2+ DTRs and 2+ pulses.  Gait normal with good balance and coordination.  MSK: Right knee exam shows the patient has no swelling of the knee noted at this time.  Patient still has a very mild positive grind test noted.  Mild patella alta noted    Impression  and Recommendations:     The above documentation has been reviewed and is accurate and complete Lyndal Pulley, DO       Note: This dictation was prepared with Dragon dictation along with smaller phrase technology. Any transcriptional errors that result from this process are unintentional.

## 2019-09-02 ENCOUNTER — Other Ambulatory Visit: Payer: Self-pay | Admitting: Internal Medicine

## 2019-09-02 ENCOUNTER — Encounter: Payer: Self-pay | Admitting: Internal Medicine

## 2019-09-02 LAB — URINALYSIS, ROUTINE W REFLEX MICROSCOPIC
Bacteria, UA: NONE SEEN /HPF
Bilirubin Urine: NEGATIVE
Hgb urine dipstick: NEGATIVE
Hyaline Cast: NONE SEEN /LPF
Ketones, ur: NEGATIVE
Leukocytes,Ua: NEGATIVE
Nitrite: NEGATIVE
Specific Gravity, Urine: >=1.045 — AB (ref 1.001–1.03)
Squamous Epithelial / HPF: NONE SEEN /HPF (ref <=5)
WBC, UA: NONE SEEN /HPF (ref 0–5)
pH: 8 (ref 5.0–8.0)

## 2019-09-02 LAB — CBC WITH DIFFERENTIAL/PLATELET
Absolute Monocytes: 456 cells/uL (ref 200–950)
Basophils Absolute: 39 cells/uL (ref 0–200)
Basophils Relative: 0.9 %
Eosinophils Absolute: 90 cells/uL (ref 15–500)
Eosinophils Relative: 2.1 %
HCT: 43.1 % (ref 38.5–50.0)
Hemoglobin: 13.6 g/dL (ref 13.2–17.1)
Lymphs Abs: 1750 cells/uL (ref 850–3900)
MCH: 26.3 pg — ABNORMAL LOW (ref 27.0–33.0)
MCHC: 31.6 g/dL — ABNORMAL LOW (ref 32.0–36.0)
MCV: 83.4 fL (ref 80.0–100.0)
MPV: 9.9 fL (ref 7.5–12.5)
Monocytes Relative: 10.6 %
Neutro Abs: 1965 cells/uL (ref 1500–7800)
Neutrophils Relative %: 45.7 %
Platelets: 266 10*3/uL (ref 140–400)
RBC: 5.17 10*6/uL (ref 4.20–5.80)
RDW: 13.4 % (ref 11.0–15.0)
Total Lymphocyte: 40.7 %
WBC: 4.3 10*3/uL (ref 3.8–10.8)

## 2019-09-02 LAB — COMPLETE METABOLIC PANEL WITH GFR
AG Ratio: 1.3 (calc) (ref 1.0–2.5)
ALT: 40 U/L (ref 9–46)
AST: 23 U/L (ref 10–35)
Albumin: 4.3 g/dL (ref 3.6–5.1)
Alkaline phosphatase (APISO): 59 U/L (ref 35–144)
BUN: 14 mg/dL (ref 7–25)
CO2: 27 mmol/L (ref 20–32)
Calcium: 9.6 mg/dL (ref 8.6–10.3)
Chloride: 101 mmol/L (ref 98–110)
Creat: 1.02 mg/dL (ref 0.70–1.33)
GFR, Est African American: 95 mL/min/{1.73_m2} (ref >=60)
GFR, Est Non African American: 82 mL/min/{1.73_m2} (ref >=60)
Globulin: 3.3 g/dL (calc) (ref 1.9–3.7)
Glucose, Bld: 122 mg/dL — ABNORMAL HIGH (ref 65–99)
Potassium: 3.9 mmol/L (ref 3.5–5.3)
Sodium: 140 mmol/L (ref 135–146)
Total Bilirubin: 0.4 mg/dL (ref 0.2–1.2)
Total Protein: 7.6 g/dL (ref 6.1–8.1)

## 2019-09-02 LAB — TSH: TSH: 0.89 mIU/L (ref 0.40–4.50)

## 2019-09-02 LAB — LIPID PANEL
Cholesterol: 143 mg/dL (ref <200)
HDL: 57 mg/dL (ref >=40)
LDL Cholesterol (Calc): 73 mg/dL (calc)
Non-HDL Cholesterol (Calc): 86 mg/dL (calc) (ref <130)
Total CHOL/HDL Ratio: 2.5 (calc) (ref <5.0)
Triglycerides: 54 mg/dL (ref <150)

## 2019-09-02 LAB — MICROALBUMIN / CREATININE URINE RATIO
Creatinine, Urine: 123 mg/dL (ref 20–320)
Microalb Creat Ratio: 37 mcg/mg creat — ABNORMAL HIGH (ref <30)
Microalb, Ur: 4.6 mg/dL

## 2019-09-02 LAB — HEMOGLOBIN A1C
Hgb A1c MFr Bld: 6.8 % of total Hgb — ABNORMAL HIGH (ref <5.7)
Mean Plasma Glucose: 148 (calc)
eAG (mmol/L): 8.2 (calc)

## 2019-09-02 LAB — FERRITIN: Ferritin: 22 ng/mL — ABNORMAL LOW (ref 38–380)

## 2019-09-02 LAB — PSA: PSA: 0.7 ng/mL (ref <=4.0)

## 2019-09-02 LAB — VITAMIN D 25 HYDROXY (VIT D DEFICIENCY, FRACTURES): Vit D, 25-Hydroxy: 20 ng/mL — ABNORMAL LOW (ref 30–100)

## 2019-09-02 MED ORDER — VITAMIN D (ERGOCALCIFEROL) 1.25 MG (50000 UNIT) PO CAPS: 50000.0000 [IU] | ORAL_CAPSULE | ORAL | 0 refills | Status: DC

## 2019-09-06 ENCOUNTER — Encounter: Payer: Self-pay | Admitting: Internal Medicine

## 2019-09-06 ENCOUNTER — Other Ambulatory Visit: Payer: Self-pay | Admitting: Internal Medicine

## 2019-09-06 NOTE — Assessment & Plan Note (Signed)
Pt wants vasectomy - refer urology

## 2019-09-06 NOTE — Telephone Encounter (Signed)
Please refill as per office routine med refill policy (all routine meds refilled for 3 mo or monthly per pt preference up to one year from last visit, then month to month grace period for 3 mo, then further med refills will have to be denied)  

## 2019-09-06 NOTE — Assessment & Plan Note (Signed)
Cont oral replacement 

## 2019-09-06 NOTE — Assessment & Plan Note (Signed)

## 2019-09-06 NOTE — Assessment & Plan Note (Signed)
stable overall by history and exam, recent data reviewed with pt, and pt to continue medical treatment as before,  to f/u any worsening symptoms or concerns  

## 2019-09-06 NOTE — Assessment & Plan Note (Signed)
stable overall by history and exam, recent data reviewed with pt, and pt to continue medical treatment as before,  to f/u any worsening symptoms or concerns, for lab f/u 

## 2019-09-22 ENCOUNTER — Other Ambulatory Visit: Payer: 59

## 2019-09-22 ENCOUNTER — Inpatient Hospital Stay: Payer: 59 | Attending: Hematology and Oncology

## 2019-09-22 ENCOUNTER — Encounter: Payer: Self-pay | Admitting: Endocrinology

## 2019-09-22 ENCOUNTER — Telehealth: Payer: Self-pay

## 2019-09-22 ENCOUNTER — Other Ambulatory Visit: Payer: Self-pay

## 2019-09-22 ENCOUNTER — Ambulatory Visit (INDEPENDENT_AMBULATORY_CARE_PROVIDER_SITE_OTHER): Payer: 59 | Admitting: Endocrinology

## 2019-09-22 VITALS — BP 140/88 | HR 70 | Ht 68.0 in | Wt 219.6 lb

## 2019-09-22 DIAGNOSIS — E119 Type 2 diabetes mellitus without complications: Secondary | ICD-10-CM | POA: Diagnosis not present

## 2019-09-22 DIAGNOSIS — R5383 Other fatigue: Secondary | ICD-10-CM | POA: Diagnosis not present

## 2019-09-22 DIAGNOSIS — D508 Other iron deficiency anemias: Secondary | ICD-10-CM

## 2019-09-22 DIAGNOSIS — D509 Iron deficiency anemia, unspecified: Secondary | ICD-10-CM | POA: Diagnosis present

## 2019-09-22 LAB — CBC WITH DIFFERENTIAL (CANCER CENTER ONLY)
Abs Immature Granulocytes: 0.02 10*3/uL (ref 0.00–0.07)
Basophils Absolute: 0 10*3/uL (ref 0.0–0.1)
Basophils Relative: 1 %
Eosinophils Absolute: 0.2 10*3/uL (ref 0.0–0.5)
Eosinophils Relative: 3 %
HCT: 42.7 % (ref 39.0–52.0)
Hemoglobin: 13.3 g/dL (ref 13.0–17.0)
Immature Granulocytes: 0 %
Lymphocytes Relative: 35 %
Lymphs Abs: 2.1 10*3/uL (ref 0.7–4.0)
MCH: 26.3 pg (ref 26.0–34.0)
MCHC: 31.1 g/dL (ref 30.0–36.0)
MCV: 84.4 fL (ref 80.0–100.0)
Monocytes Absolute: 0.5 10*3/uL (ref 0.1–1.0)
Monocytes Relative: 8 %
Neutro Abs: 3.3 10*3/uL (ref 1.7–7.7)
Neutrophils Relative %: 53 %
Platelet Count: 269 10*3/uL (ref 150–400)
RBC: 5.06 MIL/uL (ref 4.22–5.81)
RDW: 14 % (ref 11.5–15.5)
WBC Count: 6.2 10*3/uL (ref 4.0–10.5)
nRBC: 0 % (ref 0.0–0.2)

## 2019-09-22 MED ORDER — REPAGLINIDE 0.5 MG PO TABS: 0.5000 mg | ORAL_TABLET | Freq: Every day | ORAL | 3 refills | Status: DC

## 2019-09-22 MED ORDER — METFORMIN HCL ER 500 MG PO TB24: 2000.0000 mg | ORAL_TABLET | Freq: Every day | ORAL | 2 refills | Status: DC

## 2019-09-22 MED ORDER — METFORMIN HCL ER (MOD) 1000 MG PO TB24: 2000.0000 mg | ORAL_TABLET | Freq: Every day | ORAL | 3 refills | Status: DC

## 2019-09-22 NOTE — Progress Notes (Signed)
Subjective:    Patient ID: Jason Ortega, male    DOB: October 21, 1965, 54 y.o.   MRN: [id number]  HPI Pt returns for f/u of diabetes mellitus:  DM type: 2 Dx'ed: 4098 Complications: PN Therapy: Ozempic and 2 oral meds DKA: never Severe hypoglycemia: never Pancreatitis: never.  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   Interval history: pt states he feels well in general.  He takes meds as rx'ed.  Pt states cbg's are well-controlled.   Past Medical History:  Diagnosis Date  . Allergy   . Anemia   . Arthritis   . Cataract    bilateral cataracts removed  . Depression   . Diabetes mellitus   . Frequent headaches   . GERD (gastroesophageal reflux disease)   . Glaucoma   . Hypercholesteremia   . Hypertension   . Migraines   . Sleep apnea    wears c-PAP    Past Surgical History:  Procedure Laterality Date  . bilateral cataracts removed    . CARDIAC CATHETERIZATION  12/2011  . COLONOSCOPY    . LEFT HEART CATHETERIZATION WITH CORONARY ANGIOGRAM N/A 11/12/2011   Procedure: LEFT HEART CATHETERIZATION WITH CORONARY ANGIOGRAM;  Surgeon: Laverda Page, MD;  Location: [facility] CATH LAB;  Service: Cardiovascular;  Laterality: N/A;  . SHOULDER SURGERY      Social History   Socioeconomic History  . Marital status: Married    Spouse name: Not on file  . Number of children: 2  . Years of education: 34  . Highest education level: Not on file  Occupational History  . Occupation: Stocker  Tobacco Use  . Smoking status: Never Smoker  . Smokeless tobacco: Never Used  Vaping Use  . Vaping Use: Never used  Substance and Sexual Activity  . Alcohol use: Yes    Comment: occasional  . Drug use: No  . Sexual activity: Yes    Birth control/protection: None  Other Topics Concern  . Not on file  Social History Narrative   Fun/Hobby: Leisure centre manager football   Social Determinants of Health   Financial Resource  Strain:   . Difficulty of Paying Living Expenses: Not on file  Food Insecurity:   . Worried About Charity fundraiser in the Last Year: Not on file  . Ran Out of Food in the Last Year: Not on file  Transportation Needs:   . Lack of Transportation (Medical): Not on file  . Lack of Transportation (Non-Medical): Not on file  Physical Activity:   . Days of Exercise per Week: Not on file  . Minutes of Exercise per Session: Not on file  Stress:   . Feeling of Stress : Not on file  Social Connections:   . Frequency of Communication with Friends and Family: Not on file  . Frequency of Social Gatherings with Friends and Family: Not on file  . Attends Religious Services: Not on file  . Active Member of Clubs or Organizations: Not on file  . Attends Archivist Meetings: Not on file  . Marital Status: Not on file  Intimate Partner Violence:   . Fear of Current or Ex-Partner: Not on file  . Emotionally Abused: Not on file  . Physically Abused: Not on file  . Sexually Abused: Not on file    Current Outpatient Medications on File Prior to Visit  Medication Sig Dispense Refill  . aspirin 81 MG chewable tablet CHEW 1  TABLET (81 MG TOTAL) BY MOUTH DAILY. 36 tablet 0  . atorvastatin (LIPITOR) 40 MG tablet TAKE 1 TABLET BY MOUTH EVERY DAY 30 tablet 0  . baclofen (LIORESAL) 10 MG tablet Take 1 tablet (10 mg total) by mouth 3 (three) times daily. Prn for IBS flare 30 each 0  . Blood Glucose Monitoring Suppl (ONETOUCH VERIO) w/Device KIT 1 Device by Does not apply route 2 (two) times daily as needed. (Patient taking differently: 1 Device by Does not apply route 2 (two) times daily. E11.9) 1 kit 0  . dapagliflozin propanediol (FARXIGA) 10 MG TABS tablet Take 10 mg by mouth daily. 90 tablet 3  . Diclofenac Sodium (PENNSAID) 2 % SOLN Place 2 g onto the skin 2 (two) times daily. 112 g 3  . diclofenac sodium (VOLTAREN) 1 % GEL Apply 2 g topically 4 (four) times daily. 200 g 5  . dicyclomine  (BENTYL) 10 MG capsule TAKE 1 CAPSULE (10 MG TOTAL) BY MOUTH 4 (FOUR) TIMES DAILY - BEFORE MEALS AND AT BEDTIME. 90 capsule 3  . Erenumab-aooe (AIMOVIG) 140 MG/ML SOAJ Inject 140 mg into the skin every 30 (thirty) days. 1 pen 11  . gabapentin (NEURONTIN) 600 MG tablet Take 600 mg by mouth as needed.     [name] glucose blood (ONETOUCH VERIO) test strip 1 each by Other route 2 (two) times daily. E11.9    . ibuprofen (ADVIL,MOTRIN) 600 MG tablet TAKE 1 TABLET(600 MG) BY MOUTH EVERY 8 HOURS AS NEEDED 30 tablet 0  . losartan-hydrochlorothiazide (HYZAAR) 100-25 MG tablet TAKE 1 TABLET BY MOUTH EVERY DAY 30 tablet 0  . meloxicam (MOBIC) 15 MG tablet Take 1 tablet (15 mg total) by mouth daily. 30 tablet 5  . methocarbamol (ROBAXIN) 500 MG tablet TAKE 1 TABLET BY MOUTH TWICE DAILY (Patient taking differently: Take 500 mg by mouth every 6 (six) hours as needed. ) 20 tablet 0  . omeprazole (PRILOSEC) 40 MG capsule TAKE 1 CAPSULE BY MOUTH EVERY DAY AS NEEDED 30 capsule 0  . OneTouch Delica Lancets 10X MISC 1 each by Does not apply route 2 (two) times daily. E11.9    . PENNSAID 2 % SOLN SMARTSIG:2 Pump Topical Twice Daily    . rizatriptan (MAXALT-MLT) 10 MG disintegrating tablet Take 1 tablet (10 mg total) by mouth as needed for migraine. May repeat in 2 hours if needed 9 tablet 11  . Semaglutide, 1 MG/DOSE, (OZEMPIC, 1 MG/DOSE,) 2 MG/1.5ML SOPN Inject 1 mg into the skin once a week. 12 pen 3  . sildenafil (REVATIO) 20 MG tablet Take 20 mg by mouth. 3-5 tabs prn    . SUMAtriptan (IMITREX) 100 MG tablet Take 1 tablet at onset of migraine. May repeat in 2 hours if headache persists or recurs. Do not exceed 2 tablets in 24 hours or 10 tablets per month. 10 tablet 11  . timolol (TIMOPTIC-XR) 0.5 % ophthalmic gel-forming Place 1 drop into both eyes daily.     . verapamil (CALAN-SR) 180 MG CR tablet Take 1 tablet (180 mg total) by mouth daily. 90 tablet 1  . Vitamin D, Ergocalciferol, (DRISDOL) 1.25 MG (50000 UNIT) CAPS  capsule Take 1 capsule (50,000 Units total) by mouth every 7 (seven) days. 12 capsule 0   No current facility-administered medications on file prior to visit.    Allergies  Allergen Reactions  . Pollen Extract Other (See Comments)    Stuffy nose    Family History  Problem Relation Age of Onset  . Arthritis  Mother   . Stroke Mother   . Hypertension Father   . Diabetes Father   . Cancer Father        Multiple Myleoma  . Diabetes Maternal Grandfather   . Colon cancer Neg Hx   . Esophageal cancer Neg Hx   . Rectal cancer Neg Hx   . Stomach cancer Neg Hx     BP 140/88   Pulse 70   Ht '5\' 8"'$  (1.727 m)   Wt 219 lb 9.6 oz (99.6 kg)   SpO2 98%   BMI 33.39 kg/m    Review of Systems Nausea is mild    Objective:   Physical Exam VITAL SIGNS:  See vs page GENERAL: no distress Pulses: dorsalis pedis intact bilat.   MSK: no deformity of the feet CV: no leg edema Skin:  no ulcer on the feet.  normal color and temp on the feet. Neuro: sensation is intact to touch on the feet  Lab Results  Component Value Date   CREATININE 1.02 09/01/2019   BUN 14 09/01/2019   NA 140 09/01/2019   K 3.9 09/01/2019   CL 101 09/01/2019   CO2 27 09/01/2019    Lab Results  Component Value Date   HGBA1C 6.8 (H) 09/01/2019        Assessment & Plan:  Type 2 DM: worse: goal A1c is low 6's.  HTN: is noted today  Patient Instructions  Your blood pressure is high today.  Please see your primary care provider soon, to have it rechecked.   I have sent a prescription to your pharmacy, to add "repaglinide."   continue the same other diabetes medications. check your blood sugar twice a day.  vary the time of day when you check, between before the 3 meals, and at bedtime.  also check if you have symptoms of your blood sugar being too high or too low.  please keep a record of the readings and bring it to your next appointment here (or you can bring the meter itself).  You can write it on any piece  of paper.  please call us sooner if your blood sugar goes below 70, or if you have a lot of readings over 200.  Please come back for a follow-up appointment in 4 months.

## 2019-09-22 NOTE — Telephone Encounter (Signed)
Spoke with Dr. Everardo All re: need for PA for [facility]. States no need to proceed with PA. Will change for covered alternative, Glucophage. New Rx sent per Dr. George Hugh orders.

## 2019-09-22 NOTE — Patient Instructions (Addendum)
Your blood pressure is high today.  Please see your primary care provider soon, to have it rechecked.   I have sent a prescription to your pharmacy, to add "repaglinide."   continue the same other diabetes medications. check your blood sugar twice a day.  vary the time of day when you check, between before the 3 meals, and at bedtime.  also check if you have symptoms of your blood sugar being too high or too low.  please keep a record of the readings and bring it to your next appointment here (or you can bring the meter itself).  You can write it on any piece of paper.  please call us sooner if your blood sugar goes below 70, or if you have a lot of readings over 200.  Please come back for a follow-up appointment in 4 months.

## 2019-09-23 LAB — FERRITIN: Ferritin: 32 ng/mL (ref 24–336)

## 2019-09-23 LAB — IRON AND TIBC
Iron: 81 ug/dL (ref 42–163)
Saturation Ratios: 21 % (ref 20–55)
TIBC: 385 ug/dL (ref 202–409)
UIBC: 304 ug/dL (ref 117–376)

## 2019-09-24 NOTE — Progress Notes (Signed)
Patient Care Team: Biagio Borg, MD as PCP - General (Internal Medicine)  DIAGNOSIS:    ICD-10-CM   1. Iron deficiency anemia, unspecified iron deficiency anemia type  D50.9     CHIEF COMPLIANT: Follow-up of iron deficiency anemia  INTERVAL HISTORY: Jason Ortega is a 54 y.o. with above-mentioned history of iron deficiency anemiapreviously treated with IViron. Labs from 09/22/19 show: Hg 13.3, HCT 42.7, MCV 84.4, iron saturation 21%, ferritin 32. He presents to the clinic today for follow-up to review recent labs.   He continues to report mild to moderate fatigue but because of long hours with the [employer].  Otherwise he does not report any blood loss anywhere.  ALLERGIES:  is allergic to pollen extract.  MEDICATIONS:  Current Outpatient Medications  Medication Sig Dispense Refill  . aspirin 81 MG chewable tablet CHEW 1 TABLET (81 MG TOTAL) BY MOUTH DAILY. 36 tablet 0  . atorvastatin (LIPITOR) 40 MG tablet TAKE 1 TABLET BY MOUTH EVERY DAY 30 tablet 0  . baclofen (LIORESAL) 10 MG tablet Take 1 tablet (10 mg total) by mouth 3 (three) times daily. Prn for IBS flare 30 each 0  . Blood Glucose Monitoring Suppl (ONETOUCH VERIO) w/Device KIT 1 Device by Does not apply route 2 (two) times daily as needed. (Patient taking differently: 1 Device by Does not apply route 2 (two) times daily. E11.9) 1 kit 0  . dapagliflozin propanediol (FARXIGA) 10 MG TABS tablet Take 10 mg by mouth daily. 90 tablet 3  . Diclofenac Sodium (PENNSAID) 2 % SOLN Place 2 g onto the skin 2 (two) times daily. 112 g 3  . diclofenac sodium (VOLTAREN) 1 % GEL Apply 2 g topically 4 (four) times daily. 200 g 5  . dicyclomine (BENTYL) 10 MG capsule TAKE 1 CAPSULE (10 MG TOTAL) BY MOUTH 4 (FOUR) TIMES DAILY - BEFORE MEALS AND AT BEDTIME. 90 capsule 3  . Erenumab-aooe (AIMOVIG) 140 MG/ML SOAJ Inject 140 mg into the skin every 30 (thirty) days. 1 pen 11  . gabapentin (NEURONTIN) 600 MG tablet Take 600 mg by mouth as  needed.     [name] glucose blood (ONETOUCH VERIO) test strip 1 each by Other route 2 (two) times daily. E11.9    . ibuprofen (ADVIL,MOTRIN) 600 MG tablet TAKE 1 TABLET(600 MG) BY MOUTH EVERY 8 HOURS AS NEEDED 30 tablet 0  . losartan-hydrochlorothiazide (HYZAAR) 100-25 MG tablet TAKE 1 TABLET BY MOUTH EVERY DAY 30 tablet 0  . meloxicam (MOBIC) 15 MG tablet Take 1 tablet (15 mg total) by mouth daily. 30 tablet 5  . metFORMIN (GLUCOPHAGE-XR) 500 MG 24 hr tablet Take 4 tablets (2,000 mg total) by mouth daily with breakfast. 120 tablet 2  . methocarbamol (ROBAXIN) 500 MG tablet TAKE 1 TABLET BY MOUTH TWICE DAILY (Patient taking differently: Take 500 mg by mouth every 6 (six) hours as needed. ) 20 tablet 0  . omeprazole (PRILOSEC) 40 MG capsule TAKE 1 CAPSULE BY MOUTH EVERY DAY AS NEEDED 30 capsule 0  . OneTouch Delica Lancets 01S MISC 1 each by Does not apply route 2 (two) times daily. E11.9    . PENNSAID 2 % SOLN SMARTSIG:2 Pump Topical Twice Daily    . repaglinide (PRANDIN) 0.5 MG tablet Take 1 tablet (0.5 mg total) by mouth daily with supper. 90 tablet 3  . rizatriptan (MAXALT-MLT) 10 MG disintegrating tablet Take 1 tablet (10 mg total) by mouth as needed for migraine. May repeat in 2 hours if needed 9  tablet 11  . Semaglutide, 1 MG/DOSE, (OZEMPIC, 1 MG/DOSE,) 2 MG/1.5ML SOPN Inject 1 mg into the skin once a week. 12 pen 3  . sildenafil (REVATIO) 20 MG tablet Take 20 mg by mouth. 3-5 tabs prn    . SUMAtriptan (IMITREX) 100 MG tablet Take 1 tablet at onset of migraine. May repeat in 2 hours if headache persists or recurs. Do not exceed 2 tablets in 24 hours or 10 tablets per month. 10 tablet 11  . timolol (TIMOPTIC-XR) 0.5 % ophthalmic gel-forming Place 1 drop into both eyes daily.     . verapamil (CALAN-SR) 180 MG CR tablet Take 1 tablet (180 mg total) by mouth daily. 90 tablet 1  . Vitamin D, Ergocalciferol, (DRISDOL) 1.25 MG (50000 UNIT) CAPS capsule Take 1 capsule (50,000 Units total) by mouth every 7  (seven) days. 12 capsule 0   No current facility-administered medications for this visit.    PHYSICAL EXAMINATION: ECOG PERFORMANCE STATUS: 1 - Symptomatic but completely ambulatory  Vitals:   09/25/19 0822  BP: 139/80  Pulse: 72  Resp: 18  Temp: 97.6 F (36.4 C)  SpO2: 98%   Filed Weights   09/25/19 0822  Weight: 214 lb 9.6 oz (97.3 kg)    LABORATORY DATA:  I have reviewed the data as listed CMP Latest Ref Rng & Units 09/01/2019 08/22/2018 09/03/2017  Glucose 65 - 99 mg/dL 122(H) 126(H) 135(H)  BUN 7 - 25 mg/dL $Remove'14 15 14  'cXSaQxU$ Creatinine 0.70 - 1.33 mg/dL 1.02 0.97 1.17  Sodium 135 - 146 mmol/L 140 141 139  Potassium 3.5 - 5.3 mmol/L 3.9 4.0 3.6  Chloride 98 - 110 mmol/L 101 104 102  CO2 20 - 32 mmol/L $RemoveB'27 29 29  'usNLbknx$ Calcium 8.6 - 10.3 mg/dL 9.6 9.2 9.2  Total Protein 6.1 - 8.1 g/dL 7.6 7.2 8.0  Total Bilirubin 0.2 - 1.2 mg/dL 0.4 0.3 0.4  Alkaline Phos 39 - 117 U/L - 61 49  AST 10 - 35 U/L $Remo'23 20 25  'HbXRm$ ALT 9 - 46 U/L 40 23 20    Lab Results  Component Value Date   WBC 6.2 09/22/2019   HGB 13.3 09/22/2019   HCT 42.7 09/22/2019   MCV 84.4 09/22/2019   PLT 269 09/22/2019   NEUTROABS 3.3 09/22/2019    ASSESSMENT & PLAN:  Iron deficiency anemia Possible etiology occult blood loss IV iron: September 2019 Lab review: 03/24/2018: Hemoglobin 13.3, MCV 83.6, RDW 15.9 09/22/2019: Hemoglobin 13.3, MCV 84.4, ferritin 32, iron saturation 21%  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 I discussed with him that the lab results do not indicate any iron deficiency. Based on stabilization of labs over the past 2 years, I recommended that he can be seen on an as-needed basis.  No orders of the defined types were placed in this encounter.  The patient has a good understanding of the overall plan. he agrees with it. he will call with any problems that may develop before the next visit here.  Total time spent: 20 mins including face to face time and time spent for  planning, charting and coordination of care  Nicholas Lose, MD 09/25/2019  I, Cloyde Reams Dorshimer, am acting as scribe for Dr. Nicholas Lose.  I have reviewed the above documentation for accuracy and completeness, and I agree with the above.

## 2019-09-25 ENCOUNTER — Other Ambulatory Visit: Payer: Self-pay

## 2019-09-25 ENCOUNTER — Inpatient Hospital Stay (HOSPITAL_BASED_OUTPATIENT_CLINIC_OR_DEPARTMENT_OTHER): Payer: 59 | Admitting: Hematology and Oncology

## 2019-09-25 DIAGNOSIS — D509 Iron deficiency anemia, unspecified: Secondary | ICD-10-CM | POA: Diagnosis not present

## 2019-09-25 NOTE — Assessment & Plan Note (Signed)
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 I discussed with him that the lab results do not indicate any iron deficiency. Based on stabilization of labs over the past 2 years, I recommended that he can be seen on an as-needed basis.

## 2019-09-28 ENCOUNTER — Telehealth: Payer: Self-pay | Admitting: Hematology and Oncology

## 2019-09-28 NOTE — Telephone Encounter (Signed)
Per 8/20 los, no changes made to pt schedule    

## 2019-10-08 ENCOUNTER — Other Ambulatory Visit: Payer: Self-pay | Admitting: Internal Medicine

## 2019-10-08 NOTE — Telephone Encounter (Signed)
Please refill as per office routine med refill policy (all routine meds refilled for 3 mo or monthly per pt preference up to one year from last visit, then month to month grace period for 3 mo, then further med refills will have to be denied)  

## 2019-10-10 ENCOUNTER — Other Ambulatory Visit: Payer: Self-pay | Admitting: Internal Medicine

## 2019-10-10 NOTE — Telephone Encounter (Signed)
Please refill as per office routine med refill policy (all routine meds refilled for 3 mo or monthly per pt preference up to one year from last visit, then month to month grace period for 3 mo, then further med refills will have to be denied)  

## 2019-10-13 ENCOUNTER — Other Ambulatory Visit: Payer: Self-pay | Admitting: Internal Medicine

## 2019-10-13 NOTE — Telephone Encounter (Signed)
Please refill as per office routine med refill policy (all routine meds refilled for 3 mo or monthly per pt preference up to one year from last visit, then month to month grace period for 3 mo, then further med refills will have to be denied)  

## 2019-10-24 ENCOUNTER — Other Ambulatory Visit: Payer: Self-pay | Admitting: Internal Medicine

## 2019-10-24 NOTE — Telephone Encounter (Signed)
Please refill as per office routine med refill policy (all routine meds refilled for 3 mo or monthly per pt preference up to one year from last visit, then month to month grace period for 3 mo, then further med refills will have to be denied)  

## 2019-11-02 NOTE — Progress Notes (Signed)
[place] Phone: [phone] Subjective:   [name], am serving as a scribe for Dr. Hulan Saas. This visit occurred during the SARS-CoV-2 public health emergency.  Safety protocols were in place, including screening questions prior to the visit, additional usage of staff PPE, and extensive cleaning of exam room while observing appropriate contact time as indicated for disinfecting solutions.   I'm seeing this patient by the request  of:  Biagio Borg, MD  CC: right knee pain   [id number]   09/01/2019 Patient states 80 to 90% better.  Still noticing discomfort with walking greater than 2 hours at a time.  Is no longer using the brace at the moment.  I believe patient will likely do well if he does increase his activity.  Can continue to give him some mild restrictions at work if necessary but I believe patient should do well.  Follow-up with me again in 4 to 8 weeks  Update 11/02/2019 Rydge Texidor is a 54 y.o. male coming in with complaint of right knee pain. Patient states that he is doing better but the right knee is weak in hamstring area. Prolonged walking increases weakness. Uses brace prn for stability.  Patient feels like the pain is more in the posterior aspect of the knee.  Describes it as a dull, throbbing aching sensation.  Nothing that stops him from activity but at the end of the day seems to be worse.  -     Past Medical History:  Diagnosis Date  . Allergy   . Anemia   . Arthritis   . Cataract    bilateral cataracts removed  . Depression   . Diabetes mellitus   . Frequent headaches   . GERD (gastroesophageal reflux disease)   . Glaucoma   . Hypercholesteremia   . Hypertension   . Migraines   . Sleep apnea    wears c-PAP   Past Surgical History:  Procedure Laterality Date  . bilateral cataracts removed    . CARDIAC CATHETERIZATION  12/2011  . COLONOSCOPY    . LEFT HEART  CATHETERIZATION WITH CORONARY ANGIOGRAM N/A 11/12/2011   Procedure: LEFT HEART CATHETERIZATION WITH CORONARY ANGIOGRAM;  Surgeon: Laverda Page, MD;  Location: [facility] CATH LAB;  Service: Cardiovascular;  Laterality: N/A;  . SHOULDER SURGERY     Social History   Socioeconomic History  . Marital status: Married    Spouse name: Not on file  . Number of children: 2  . Years of education: 30  . Highest education level: Not on file  Occupational History  . Occupation: Stocker  Tobacco Use  . Smoking status: Never Smoker  . Smokeless tobacco: Never Used  Vaping Use  . Vaping Use: Never used  Substance and Sexual Activity  . Alcohol use: Yes    Comment: occasional  . Drug use: No  . Sexual activity: Yes    Birth control/protection: None  Other Topics Concern  . Not on file  Social History Narrative   Fun/Hobby: Leisure centre manager football   Social Determinants of Health   Financial Resource Strain:   . Difficulty of Paying Living Expenses: Not on file  Food Insecurity:   . Worried About Charity fundraiser in the Last Year: Not on file  . Ran Out of Food in the Last Year: Not on file  Transportation Needs:   . Lack of Transportation (Medical): Not on file  . Lack of  Transportation (Non-Medical): Not on file  Physical Activity:   . Days of Exercise per Week: Not on file  . Minutes of Exercise per Session: Not on file  Stress:   . Feeling of Stress : Not on file  Social Connections:   . Frequency of Communication with Friends and Family: Not on file  . Frequency of Social Gatherings with Friends and Family: Not on file  . Attends Religious Services: Not on file  . Active Member of Clubs or Organizations: Not on file  . Attends Archivist Meetings: Not on file  . Marital Status: Not on file   Allergies  Allergen Reactions  . Pollen Extract Other (See Comments)    Stuffy nose   Family History  Problem Relation Age of Onset  . Arthritis Mother   . Stroke Mother   .  Hypertension Father   . Diabetes Father   . Cancer Father        Multiple Myleoma  . Diabetes Maternal Grandfather   . Colon cancer Neg Hx   . Esophageal cancer Neg Hx   . Rectal cancer Neg Hx   . Stomach cancer Neg Hx     Current Outpatient Medications (Endocrine & Metabolic):  .  dapagliflozin propanediol (FARXIGA) 10 MG TABS tablet, Take 10 mg by mouth daily. .  metFORMIN (GLUCOPHAGE-XR) 500 MG 24 hr tablet, Take 4 tablets (2,000 mg total) by mouth daily with breakfast. .  repaglinide (PRANDIN) 0.5 MG tablet, Take 1 tablet (0.5 mg total) by mouth daily with supper. .  Semaglutide, 1 MG/DOSE, (OZEMPIC, 1 MG/DOSE,) 2 MG/1.5ML SOPN, Inject 1 mg into the skin once a week.  Current Outpatient Medications (Cardiovascular):  .  atorvastatin (LIPITOR) 40 MG tablet, TAKE 1 TABLET BY MOUTH EVERY DAY .  losartan-hydrochlorothiazide (HYZAAR) 100-25 MG tablet, TAKE 1 TABLET BY MOUTH EVERY DAY .  sildenafil (REVATIO) 20 MG tablet, Take 20 mg by mouth. 3-5 tabs prn .  verapamil (CALAN-SR) 180 MG CR tablet, Take 1 tablet (180 mg total) by mouth daily.   Current Outpatient Medications (Analgesics):  .  aspirin 81 MG chewable tablet, CHEW 1 TABLET (81 MG TOTAL) BY MOUTH DAILY. [name]  Erenumab-aooe (AIMOVIG) 140 MG/ML SOAJ, Inject 140 mg into the skin every 30 (thirty) days. [name]  ibuprofen (ADVIL,MOTRIN) 600 MG tablet, TAKE 1 TABLET(600 MG) BY MOUTH EVERY 8 HOURS AS NEEDED .  meloxicam (MOBIC) 15 MG tablet, Take 1 tablet (15 mg total) by mouth daily. .  rizatriptan (MAXALT-MLT) 10 MG disintegrating tablet, Take 1 tablet (10 mg total) by mouth as needed for migraine. May repeat in 2 hours if needed .  SUMAtriptan (IMITREX) 100 MG tablet, Take 1 tablet at onset of migraine. May repeat in 2 hours if headache persists or recurs. Do not exceed 2 tablets in 24 hours or 10 tablets per month.   Current Outpatient Medications (Other):  .  baclofen (LIORESAL) 10 MG tablet, Take 1 tablet (10 mg total) by mouth 3  (three) times daily. Prn for IBS flare .  Blood Glucose Monitoring Suppl (ONETOUCH VERIO) w/Device KIT, 1 Device by Does not apply route 2 (two) times daily as needed. (Patient taking differently: 1 Device by Does not apply route 2 (two) times daily. E11.9) .  Diclofenac Sodium (PENNSAID) 2 % SOLN, Place 2 g onto the skin 2 (two) times daily. .  diclofenac sodium (VOLTAREN) 1 % GEL, Apply 2 g topically 4 (four) times daily. [name]  dicyclomine (BENTYL) 10 MG capsule, TAKE  1 CAPSULE (10 MG TOTAL) BY MOUTH 4 (FOUR) TIMES DAILY - BEFORE MEALS AND AT BEDTIME. [name]  gabapentin (NEURONTIN) 600 MG tablet, Take 600 mg by mouth as needed.  [name]  glucose blood (ONETOUCH VERIO) test strip, 1 each by Other route 2 (two) times daily. E11.9 .  methocarbamol (ROBAXIN) 500 MG tablet, TAKE 1 TABLET BY MOUTH TWICE DAILY (Patient taking differently: Take 500 mg by mouth every 6 (six) hours as needed. ) .  omeprazole (PRILOSEC) 40 MG capsule, TAKE 1 CAPSULE BY MOUTH EVERY DAY .  OneTouch Delica Lancets 03E MISC, 1 each by Does not apply route 2 (two) times daily. E11.9 .  PENNSAID 2 % SOLN, SMARTSIG:2 Pump Topical Twice Daily .  timolol (TIMOPTIC-XR) 0.5 % ophthalmic gel-forming, Place 1 drop into both eyes daily.  .  Vitamin D, Ergocalciferol, (DRISDOL) 1.25 MG (50000 UNIT) CAPS capsule, Take 1 capsule (50,000 Units total) by mouth every 7 (seven) days.   Reviewed prior external information including notes and imaging from  primary care provider As well as notes that were available from care everywhere and other healthcare systems.  Past medical history, social, surgical and family history all reviewed in electronic medical record.  No pertanent information unless stated regarding to the chief complaint.   Review of Systems:  No headache, visual changes, nausea, vomiting, diarrhea, constipation, dizziness, abdominal pain, skin rash, fevers, chills, night sweats, weight loss, swollen lymph nodes, body aches, joint swelling,  chest pain, shortness of breath, mood changes. POSITIVE muscle aches  Objective  Blood pressure 110/84, pulse 74, height $RemoveBe'5\' 8"'HVSuxcESq$  (1.727 m), weight 215 lb (97.5 kg), SpO2 99 %.   General: No apparent distress alert and oriented x3 mood and affect normal, dressed appropriately.  HEENT: Pupils equal, extraocular movements intact  Respiratory: Patient's speak in full sentences and does not appear short of breath  Cardiovascular: No lower extremity edema, non tender, no erythema  Neuro: Cranial nerves II through XII are intact, neurovascularly intact in all extremities with 2+ DTRs and 2+ pulses.  Gait normal with good balance and coordination.  MSK: Right knee on the anterior aspect seems to be doing well.  No significant lateral tracking noted at the moment.  Patient does have pain on the posterior aspect of the knee.  Seems to be more tightness of the hamstring on the lateral aspect.  Negative squeeze test of the calf.  Good strength of the hamstring.  No pain at the origin.    Impression and Recommendations:     The above documentation has been reviewed and is accurate and complete Lyndal Pulley, DO       Note: This dictation was prepared with Dragon dictation along with smaller phrase technology. Any transcriptional errors that result from this process are unintentional.

## 2019-11-03 ENCOUNTER — Encounter: Payer: Self-pay | Admitting: Family Medicine

## 2019-11-03 ENCOUNTER — Ambulatory Visit (INDEPENDENT_AMBULATORY_CARE_PROVIDER_SITE_OTHER): Payer: 59 | Admitting: Family Medicine

## 2019-11-03 ENCOUNTER — Telehealth: Payer: Self-pay | Admitting: Emergency Medicine

## 2019-11-03 ENCOUNTER — Other Ambulatory Visit: Payer: Self-pay

## 2019-11-03 DIAGNOSIS — S76311A Strain of muscle, fascia and tendon of the posterior muscle group at thigh level, right thigh, initial encounter: Secondary | ICD-10-CM | POA: Insufficient documentation

## 2019-11-03 NOTE — Telephone Encounter (Signed)
Error

## 2019-11-03 NOTE — Patient Instructions (Signed)
Hamstring exercises Thigh compression sleeve Spenco Total Support Orthotics Ice 20 min Shorten stride length See me in 6-8 weeks

## 2019-11-03 NOTE — Assessment & Plan Note (Signed)
Right hamstring strain noted.  Patient encouraged to wear more of a thigh compression, decrease the stride length, discussed over-the-counter orthotics with patient not getting the other shoes that have been recommended previously.  Discussed if worsening symptoms physical therapy could be beneficial.  Follow-up again in 8 weeks

## 2019-11-23 ENCOUNTER — Other Ambulatory Visit: Payer: Self-pay | Admitting: Internal Medicine

## 2019-11-23 NOTE — Telephone Encounter (Signed)
Please take OTC Vitamin D3 at 2000 units per day, indefinitely.  

## 2019-12-16 ENCOUNTER — Other Ambulatory Visit: Payer: Self-pay | Admitting: Endocrinology

## 2019-12-16 DIAGNOSIS — E119 Type 2 diabetes mellitus without complications: Secondary | ICD-10-CM

## 2019-12-22 NOTE — Progress Notes (Signed)
[facility] [place] Phone: [phone] Subjective:   Jason Ortega, am serving as a scribe for Dr. Hulan Saas. This visit occurred during the SARS-CoV-2 public health emergency.  Safety protocols were in place, including screening questions prior to the visit, additional usage of staff PPE, and extensive cleaning of exam room while observing appropriate contact time as indicated for disinfecting solutions.   I'm seeing this patient by the request  of:  Jason Borg, MD  CC: Hamstring pain follow-up  [id number]   11/03/2019 Right hamstring strain noted.  Patient encouraged to wear more of a thigh compression, decrease the stride length, discussed over-the-counter orthotics with patient not getting the other shoes that have been recommended previously.  Discussed if worsening symptoms physical therapy could be beneficial.  Follow-up again in 8 weeks  Update 12/23/2019 Jason Ortega is a 54 y.o. male coming in with complaint of right hamstring pain. Patient states that he has been doing okay, but the more he walks the weaker it gets. Has not been able to get the compression sleeve he was told to get because he cannot find them.   Patient states that is trying to increase activity.  Patient states that the pain never stops him just feels like it gets tired more frequently still.  Mild discomfort in the back and mild discomfort of the knees.     Past Medical History:  Diagnosis Date  . Allergy   . Anemia   . Arthritis   . Cataract    bilateral cataracts removed  . Depression   . Diabetes mellitus   . Frequent headaches   . GERD (gastroesophageal reflux disease)   . Glaucoma   . Hypercholesteremia   . Hypertension   . Migraines   . Sleep apnea    wears c-PAP   Past Surgical History:  Procedure Laterality Date  . bilateral cataracts removed    . CARDIAC CATHETERIZATION  12/2011  . COLONOSCOPY    . LEFT HEART  CATHETERIZATION WITH CORONARY ANGIOGRAM N/A 11/12/2011   Procedure: LEFT HEART CATHETERIZATION WITH CORONARY ANGIOGRAM;  Surgeon: Laverda Page, MD;  Location[facility] CATH LAB;  Service: Cardiovascular;  Laterality: N/A;  . SHOULDER SURGERY     Social History   Socioeconomic History  . Marital status: Married    Spouse name: Not on file  . Number of children: 2  . Years of education: 61  . Highest education level: Not on file  Occupational History  . Occupation: Stocker  Tobacco Use  . Smoking status: Never Smoker  . Smokeless tobacco: Never Used  Vaping Use  . Vaping Use: Never used  Substance and Sexual Activity  . Alcohol use: Yes    Comment: occasional  . Drug use: No  . Sexual activity: Yes    Birth control/protection: None  Other Topics Concern  . Not on file  Social History Narrative   Fun/Hobby: Leisure centre manager football   Social Determinants of Health   Financial Resource Strain:   . Difficulty of Paying Living Expenses: Not on file  Food Insecurity:   . Worried About Charity fundraiser in the Last Year: Not on file  . Ran Out of Food in the Last Year: Not on file  Transportation Needs:   . Lack of Transportation (Medical): Not on file  . Lack of Transportation (Non-Medical): Not on file  Physical Activity:   . Days of Exercise per Week: Not on file  .  Minutes of Exercise per Session: Not on file  Stress:   . Feeling of Stress : Not on file  Social Connections:   . Frequency of Communication with Friends and Family: Not on file  . Frequency of Social Gatherings with Friends and Family: Not on file  . Attends Religious Services: Not on file  . Active Member of Clubs or Organizations: Not on file  . Attends Archivist Meetings: Not on file  . Marital Status: Not on file   Allergies  Allergen Reactions  . Pollen Extract Other (See Comments)    Stuffy nose   Family History  Problem Relation Age of Onset  . Arthritis Mother   . Stroke Mother   .  Hypertension Father   . Diabetes Father   . Cancer Father        Multiple Myleoma  . Diabetes Maternal Grandfather   . Colon cancer Neg Hx   . Esophageal cancer Neg Hx   . Rectal cancer Neg Hx   . Stomach cancer Neg Hx     Current Outpatient Medications (Endocrine & Metabolic):  .  dapagliflozin propanediol (FARXIGA) 10 MG TABS tablet, Take 10 mg by mouth daily. .  metFORMIN (GLUCOPHAGE-XR) 500 MG 24 hr tablet, TAKE 4 TABLETS BY MOUTH DAILY WITH BREAKFAST .  repaglinide (PRANDIN) 0.5 MG tablet, Take 1 tablet (0.5 mg total) by mouth daily with supper. .  Semaglutide, 1 MG/DOSE, (OZEMPIC, 1 MG/DOSE,) 2 MG/1.5ML SOPN, Inject 1 mg into the skin once a week.  Current Outpatient Medications (Cardiovascular):  .  atorvastatin (LIPITOR) 40 MG tablet, TAKE 1 TABLET BY MOUTH EVERY DAY .  losartan-hydrochlorothiazide (HYZAAR) 100-25 MG tablet, TAKE 1 TABLET BY MOUTH EVERY DAY .  sildenafil (REVATIO) 20 MG tablet, Take 20 mg by mouth. 3-5 tabs prn .  verapamil (CALAN-SR) 180 MG CR tablet, Take 1 tablet (180 mg total) by mouth daily.   Current Outpatient Medications (Analgesics):  .  aspirin 81 MG chewable tablet, CHEW 1 TABLET (81 MG TOTAL) BY MOUTH DAILY. [name]  Erenumab-aooe (AIMOVIG) 140 MG/ML SOAJ, Inject 140 mg into the skin every 30 (thirty) days. [name]  ibuprofen (ADVIL,MOTRIN) 600 MG tablet, TAKE 1 TABLET(600 MG) BY MOUTH EVERY 8 HOURS AS NEEDED .  meloxicam (MOBIC) 15 MG tablet, Take 1 tablet (15 mg total) by mouth daily. .  rizatriptan (MAXALT-MLT) 10 MG disintegrating tablet, Take 1 tablet (10 mg total) by mouth as needed for migraine. May repeat in 2 hours if needed .  SUMAtriptan (IMITREX) 100 MG tablet, Take 1 tablet at onset of migraine. May repeat in 2 hours if headache persists or recurs. Do not exceed 2 tablets in 24 hours or 10 tablets per month.   Current Outpatient Medications (Other):  .  baclofen (LIORESAL) 10 MG tablet, Take 1 tablet (10 mg total) by mouth 3 (three) times daily.  Prn for IBS flare .  Blood Glucose Monitoring Suppl (ONETOUCH VERIO) w/Device KIT, 1 Device by Does not apply route 2 (two) times daily as needed. (Patient taking differently: 1 Device by Does not apply route 2 (two) times daily. E11.9) .  Diclofenac Sodium (PENNSAID) 2 % SOLN, Place 2 g onto the skin 2 (two) times daily. .  diclofenac sodium (VOLTAREN) 1 % GEL, Apply 2 g topically 4 (four) times daily. [name]  dicyclomine (BENTYL) 10 MG capsule, TAKE 1 CAPSULE (10 MG TOTAL) BY MOUTH 4 (FOUR) TIMES DAILY - BEFORE MEALS AND AT BEDTIME. [name]  gabapentin (NEURONTIN) 600 MG tablet,  Take 600 mg by mouth as needed.  [name]  glucose blood (ONETOUCH VERIO) test strip, 1 each by Other route 2 (two) times daily. E11.9 .  methocarbamol (ROBAXIN) 500 MG tablet, TAKE 1 TABLET BY MOUTH TWICE DAILY (Patient taking differently: Take 500 mg by mouth every 6 (six) hours as needed. ) .  omeprazole (PRILOSEC) 40 MG capsule, TAKE 1 CAPSULE BY MOUTH EVERY DAY .  OneTouch Delica Lancets 99H MISC, 1 each by Does not apply route 2 (two) times daily. E11.9 .  PENNSAID 2 % SOLN, SMARTSIG:2 Pump Topical Twice Daily .  timolol (TIMOPTIC-XR) 0.5 % ophthalmic gel-forming, Place 1 drop into both eyes daily.  .  Vitamin D, Ergocalciferol, (DRISDOL) 1.25 MG (50000 UNIT) CAPS capsule, Take 1 capsule (50,000 Units total) by mouth every 7 (seven) days.   Reviewed prior external information including notes and imaging from  primary care provider As well as notes that were available from care everywhere and other healthcare systems.  Past medical history, social, surgical and family history all reviewed in electronic medical record.  No pertanent information unless stated regarding to the chief complaint.   Review of Systems:  No headache, visual changes, nausea, vomiting, diarrhea, constipation, dizziness, abdominal pain, skin rash, fevers, chills, night sweats, weight loss, swollen lymph nodes, joint swelling, chest pain, shortness of breath,  mood changes. POSITIVE muscle aches, body aches  Objective  Blood pressure 140/90, pulse 78, height _0  (1.727 m), weight 214 lb (97.1 kg), SpO2 98 %.   General: No apparent distress alert and oriented x3 mood and affect normal, dressed appropriately.  HEENT: Pupils equal, extraocular movements intact  Respiratory: Patient's speak in full sentences and does not appear short of breath  Cardiovascular: No lower extremity edema, non tender, no erythema  Lateral tracking of the patella still noted on the right side.  No swelling noted.  Still some very mild tightness of the hamstring on the right compared to the left.  Patient though does not have any significant pain with resisted flexion of the knee of the hamstring.  No pain at the ischial area.  5 out of 5 strength of lower extremities bilaterally.    Impression and Recommendations:     The above documentation has been reviewed and is accurate and complete Lyndal Pulley, DO

## 2019-12-23 ENCOUNTER — Other Ambulatory Visit: Payer: Self-pay

## 2019-12-23 ENCOUNTER — Ambulatory Visit (INDEPENDENT_AMBULATORY_CARE_PROVIDER_SITE_OTHER): Payer: 59

## 2019-12-23 ENCOUNTER — Encounter: Payer: Self-pay | Admitting: Family Medicine

## 2019-12-23 ENCOUNTER — Ambulatory Visit (INDEPENDENT_AMBULATORY_CARE_PROVIDER_SITE_OTHER): Payer: 59 | Admitting: Family Medicine

## 2019-12-23 VITALS — BP 140/90 | HR 78 | Ht 68.0 in | Wt 214.0 lb

## 2019-12-23 DIAGNOSIS — M25561 Pain in right knee: Secondary | ICD-10-CM

## 2019-12-23 DIAGNOSIS — G8929 Other chronic pain: Secondary | ICD-10-CM

## 2019-12-23 DIAGNOSIS — S76311D Strain of muscle, fascia and tendon of the posterior muscle group at thigh level, right thigh, subsequent encounter: Secondary | ICD-10-CM | POA: Diagnosis not present

## 2019-12-23 DIAGNOSIS — M545 Low back pain, unspecified: Secondary | ICD-10-CM

## 2019-12-23 NOTE — Assessment & Plan Note (Signed)
Patient has made good strides at this time. We will get back x-rays to further evaluate. Patient is able to do the strides and has been able to do better and has been somewhat noncompliant with a compression sleeve. Heel lift given today as well. Encouraged new shoes that I think will be beneficial. 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 Continue the gabapentin, patient is not taking the meloxicam and is only taking aspirin. Follow-up again 8 weeks

## 2019-12-23 NOTE — Patient Instructions (Addendum)
Good to see you  [email or web address] for compression Heel lift in shoes until [place] comes Keep doing exercises Ice after work Good luck with the holiday season See me again in 2 months if not completely better

## 2020-01-05 ENCOUNTER — Telehealth: Payer: Self-pay | Admitting: Family Medicine

## 2020-01-05 NOTE — Telephone Encounter (Signed)
Pt called stating that in order for him to keep his FMLA in tact for work he has to be seen twice a year not once a year. Pt was offered next available in Feb and he stated he has to be seen sooner than that. Please advise.

## 2020-01-05 NOTE — Telephone Encounter (Signed)
Placed in cancellation list for AL/NP. Will call when cancellation.

## 2020-01-11 ENCOUNTER — Encounter: Payer: Self-pay | Admitting: Family Medicine

## 2020-01-11 ENCOUNTER — Telehealth (INDEPENDENT_AMBULATORY_CARE_PROVIDER_SITE_OTHER): Payer: 59 | Admitting: Family Medicine

## 2020-01-11 DIAGNOSIS — G43709 Chronic migraine without aura, not intractable, without status migrainosus: Secondary | ICD-10-CM | POA: Diagnosis not present

## 2020-01-11 DIAGNOSIS — G4733 Obstructive sleep apnea (adult) (pediatric): Secondary | ICD-10-CM

## 2020-01-11 DIAGNOSIS — Z9989 Dependence on other enabling machines and devices: Secondary | ICD-10-CM | POA: Diagnosis not present

## 2020-01-11 NOTE — Progress Notes (Addendum)
PATIENT: Jason Ortega DOB: 24-Sep-1965  REASON FOR VISIT: follow up HISTORY FROM: patient  Virtual Visit via Telephone Note  I connected with Jason Ortega on 01/11/20 at  8:30 AM EST by telephone and verified that I am speaking with the correct person using two identifiers.   I discussed the limitations, risks, security and privacy concerns of performing an evaluation and management service by telephone and the availability of in person appointments. I also discussed with the patient that there may be a patient responsible charge related to this service. The patient expressed understanding and agreed to proceed.   History of Present Illness:  01/11/20 Jason Ortega is a 54 y.o. male here today for follow up for migraine headaches.  He continues Aimovig monthly.  He has continued to have regular headaches.  He reports to bad migraines over the past 8 months.  He has used rizatriptan and sumatriptan regularly, usually taking a full prescription of each medication every month.  He reports that rizatriptan seems to be a little bit more effective than sumatriptan.  He reports that he is using CPAP nightly.  He has not been seen by his sleep provider since March 2019.  He states that he has continued to receive supplies through DME.  He continues follow-up with PCP regularly.   Observations/Objective:  Generalized: Well developed, in no acute distress  Mentation: Alert oriented to time, place, history taking. Follows all commands speech and language fluent   Assessment and Plan:  54 y.o. year old male  has a past medical history of Allergy, Anemia, Arthritis, Cataract, Depression, Diabetes mellitus, Frequent headaches, GERD (gastroesophageal reflux disease), Glaucoma, Hypercholesteremia, Hypertension, Migraines, and Sleep apnea. here with    ICD-10-CM   1. Chronic migraine w/o aura w/o status migrainosus, not intractable  G43.709   2. OSA on CPAP  G47.33    Z99.89      Candice reports  that headaches continue on a near daily basis, however, he has only had 2 "bad migraines" over the course of the past 8 months.  He has continued Aimovig injections every month.  He reports that he is taking a full prescription of both rizatriptan and sumatriptan every month.  I have reeducated Arvon on appropriate usage of triptan therapy.  He was advised to use rizatriptan only as needed for migraine headaches.  We will discontinue sumatriptan as it seems to be less effective.  We have discussed concerns of rebound headaches.  I have also advised that he schedule follow-up as soon as possible with his sleep medicine provider at [facility].  Last compliance review shows 22% compliance in March 2019.  May consider adjustment in migraine therapy pending CPAP follow-up if patient is compliant.  Healthy lifestyle habits reviewed.  I would like to follow-up with [name] in 3 months.  He verbalizes understanding and agreement with this plan.  No orders of the defined types were placed in this encounter.   No orders of the defined types were placed in this encounter.    Follow Up Instructions:  I discussed the assessment and treatment plan with the patient. The patient was provided an opportunity to ask questions and all were answered. The patient agreed with the plan and demonstrated an understanding of the instructions.   The patient was advised to call back or seek an in-person evaluation if the symptoms worsen or if the condition fails to improve as anticipated.  I provided 15 minutes of non-face-to-face time during this encounter. Mychart visit converted  to televisit due to technical concerns. Patient is located at his place of employment, provider is in the office.    Jason Dapper, NP   Made any corrections needed, and agree with history, physical, neuro exam,assessment and plan as stated.     [name], MD [facility]

## 2020-01-11 NOTE — Patient Instructions (Signed)
Below is our plan:  We will continue Amovig monthly. I will discontinue sumatriptan as rizatriptan seems to work a little better. Use rizatriptan only as needed for abortive therapy. Please take 1 tablet at onset of headache. May take 1 additional tablet in 2 hours if needed. Do not take more than 2 tablets in 24 hours or more than 10 in a month. Please schedule follow up with sleep medicine asap to review CPAP compliance. If apnea is well managed and regular headaches continue, we should consider adjustment in medication regimen.    Please make sure you are staying well hydrated. I recommend 50-60 ounces daily. Well balanced diet and regular exercise encouraged.    Please continue follow up with care team as directed.   Follow up with me in the office in 3 months   You may receive a survey regarding today's visit. I encourage you to leave honest feed back as I do use this information to improve patient care. Thank you for seeing me today!      Migraine Headache A migraine headache is a very strong throbbing pain on one side or both sides of your head. This type of headache can also cause other symptoms. It can last from 4 hours to 3 days. Talk with your doctor about what things may bring on (trigger) this condition. What are the causes? The exact cause of this condition is not known. This condition may be triggered or caused by:  Drinking alcohol.  Smoking.  Taking medicines, such as: ? Medicine used to treat chest pain (nitroglycerin). ? Birth control pills. ? Estrogen. ? Some blood pressure medicines.  Eating or drinking certain products.  Doing physical activity. Other things that may trigger a migraine headache include:  Having a menstrual period.  Pregnancy.  Hunger.  Stress.  Not getting enough sleep or getting too much sleep.  Weather changes.  Tiredness (fatigue). What increases the risk?  Being 64-28 years old.  Being male.  Having a family history  of migraine headaches.  Being Caucasian.  Having depression or anxiety.  Being very overweight. What are the signs or symptoms?  A throbbing pain. This pain may: ? Happen in any area of the head, such as on one side or both sides. ? Make it hard to do daily activities. ? Get worse with physical activity. ? Get worse around bright lights or loud noises.  Other symptoms may include: ? Feeling sick to your stomach (nauseous). ? Vomiting. ? Dizziness. ? Being sensitive to bright lights, loud noises, or smells.  Before you get a migraine headache, you may get warning signs (an aura). An aura may include: ? Seeing flashing lights or having blind spots. ? Seeing bright spots, halos, or zigzag lines. ? Having tunnel vision or blurred vision. ? Having numbness or a tingling feeling. ? Having trouble talking. ? Having weak muscles.  Some people have symptoms after a migraine headache (postdromal phase), such as: ? Tiredness. ? Trouble thinking (concentrating). How is this treated?  Taking medicines that: ? Relieve pain. ? Relieve the feeling of being sick to your stomach. ? Prevent migraine headaches.  Treatment may also include: ? Having acupuncture. ? Avoiding foods that bring on migraine headaches. ? Learning ways to control your body functions (biofeedback). ? Therapy to help you know and deal with negative thoughts (cognitive behavioral therapy). Follow these instructions at home: Medicines  Take over-the-counter and prescription medicines only as told by your doctor.  Ask your doctor if the  medicine prescribed to you: ? Requires you to avoid driving or using heavy machinery. ? Can cause trouble pooping (constipation). You may need to take these steps to prevent or treat trouble pooping:  Drink enough fluid to keep your pee (urine) pale yellow.  Take over-the-counter or prescription medicines.  Eat foods that are high in fiber. These include beans, whole grains,  and fresh fruits and vegetables.  Limit foods that are high in fat and sugar. These include fried or sweet foods. Lifestyle  Do not drink alcohol.  Do not use any products that contain nicotine or tobacco, such as cigarettes, e-cigarettes, and chewing tobacco. If you need help quitting, ask your doctor.  Get at least 8 hours of sleep every night.  Limit and deal with stress. General instructions      Keep a journal to find out what may bring on your migraine headaches. For example, write down: ? What you eat and drink. ? How much sleep you get. ? Any change in what you eat or drink. ? Any change in your medicines.  If you have a migraine headache: ? Avoid things that make your symptoms worse, such as bright lights. ? It may help to lie down in a dark, quiet room. ? Do not drive or use heavy machinery. ? Ask your doctor what activities are safe for you.  Keep all follow-up visits as told by your doctor. This is important. Contact a doctor if:  You get a migraine headache that is different or worse than others you have had.  You have more than 15 headache days in one month. Get help right away if:  Your migraine headache gets very bad.  Your migraine headache lasts longer than 72 hours.  You have a fever.  You have a stiff neck.  You have trouble seeing.  Your muscles feel weak or like you cannot control them.  You start to lose your balance a lot.  You start to have trouble walking.  You pass out (faint).  You have a seizure. Summary  A migraine headache is a very strong throbbing pain on one side or both sides of your head. These headaches can also cause other symptoms.  This condition may be treated with medicines and changes to your lifestyle.  Keep a journal to find out what may bring on your migraine headaches.  Contact a doctor if you get a migraine headache that is different or worse than others you have had.  Contact your doctor if you have  more than 15 headache days in a month. This information is not intended to replace advice given to you by your health care provider. Make sure you discuss any questions you have with your health care provider. Document Revised: 05/16/2018 Document Reviewed: 03/06/2018 Elsevier Patient Education  2020 [employer].

## 2020-01-14 ENCOUNTER — Other Ambulatory Visit: Payer: Self-pay | Admitting: Internal Medicine

## 2020-01-22 ENCOUNTER — Ambulatory Visit: Payer: 59 | Admitting: Endocrinology

## 2020-01-25 ENCOUNTER — Encounter: Payer: Self-pay | Admitting: Internal Medicine

## 2020-01-25 ENCOUNTER — Encounter: Payer: Self-pay | Admitting: Family Medicine

## 2020-01-25 NOTE — Telephone Encounter (Signed)
Pt asks about urology referral

## 2020-02-09 ENCOUNTER — Telehealth: Payer: Self-pay | Admitting: Internal Medicine

## 2020-02-09 NOTE — Telephone Encounter (Signed)
Patient calling to report losartan-hydrochlorothiazide (HYZAAR) 100-25 MG tablet is out of stock  Please send alternative

## 2020-02-10 MED ORDER — LOSARTAN POTASSIUM 100 MG PO TABS: 100.0000 mg | ORAL_TABLET | Freq: Every day | ORAL | 1 refills | Status: DC

## 2020-02-10 MED ORDER — HYDROCHLOROTHIAZIDE 25 MG PO TABS: 25.0000 mg | ORAL_TABLET | Freq: Every day | ORAL | 1 refills | Status: DC

## 2020-02-10 NOTE — Telephone Encounter (Signed)
Ok to break up the combination med into:  Losartan 100 mg  HCT 25 mg  I have sent these instead, which is essentially the same treatment, just in 2 pills instead of 1 - ok to let pt know

## 2020-02-10 NOTE — Telephone Encounter (Signed)
Sent to Dr. John to advise. 

## 2020-02-11 ENCOUNTER — Ambulatory Visit: Payer: 59 | Admitting: Endocrinology

## 2020-02-16 ENCOUNTER — Other Ambulatory Visit: Payer: Self-pay

## 2020-02-18 ENCOUNTER — Other Ambulatory Visit: Payer: Self-pay

## 2020-02-18 ENCOUNTER — Ambulatory Visit (INDEPENDENT_AMBULATORY_CARE_PROVIDER_SITE_OTHER): Payer: 59 | Admitting: Endocrinology

## 2020-02-18 ENCOUNTER — Encounter: Payer: Self-pay | Admitting: Endocrinology

## 2020-02-18 ENCOUNTER — Ambulatory Visit: Payer: 59 | Admitting: Endocrinology

## 2020-02-18 VITALS — BP 160/88 | HR 84 | Ht 68.0 in | Wt 213.0 lb

## 2020-02-18 DIAGNOSIS — E119 Type 2 diabetes mellitus without complications: Secondary | ICD-10-CM

## 2020-02-18 LAB — POCT GLYCOSYLATED HEMOGLOBIN (HGB A1C): Hemoglobin A1C: 6.4 % — AB (ref 4.0–5.6)

## 2020-02-18 NOTE — Patient Instructions (Addendum)
Your blood pressure is high today.  Please see your primary care provider soon, to have it rechecked.    continue the same 4 diabetes medications.   check your blood sugar twice a day.  vary the time of day when you check, between before the 3 meals, and at bedtime.  also check if you have symptoms of your blood sugar being too high or too low.  please keep a record of the readings and bring it to your next appointment here (or you can bring the meter itself).  You can write it on any piece of paper.  please call us sooner if your blood sugar goes below 70, or if you have a lot of readings over 200.   Please come back for a follow-up appointment in 4 months.

## 2020-02-18 NOTE — Progress Notes (Signed)
Subjective:    Patient ID: Jason Ortega, male    DOB: Feb 17, 1965, 55 y.o.   MRN: [id number]  HPI Pt returns for f/u of diabetes mellitus:  DM type: 2 Dx'ed: 0881 Complications: PN Therapy: Ozempic and 3 oral meds DKA: never Severe hypoglycemia: never Pancreatitis: never.  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   Interval history: pt states he feels well in general.  He takes meds as rx'ed.  Pt states cbg's vary from 100-204.   Past Medical History:  Diagnosis Date  . Allergy   . Anemia   . Arthritis   . Cataract    bilateral cataracts removed  . Depression   . Diabetes mellitus   . Frequent headaches   . GERD (gastroesophageal reflux disease)   . Glaucoma   . Hypercholesteremia   . Hypertension   . Migraines   . Sleep apnea    wears c-PAP    Past Surgical History:  Procedure Laterality Date  . bilateral cataracts removed    . CARDIAC CATHETERIZATION  12/2011  . COLONOSCOPY    . LEFT HEART CATHETERIZATION WITH CORONARY ANGIOGRAM N/A 11/12/2011   Procedure: LEFT HEART CATHETERIZATION WITH CORONARY ANGIOGRAM;  Surgeon: Laverda Page, MD;  Location: [facility] CATH LAB;  Service: Cardiovascular;  Laterality: N/A;  . SHOULDER SURGERY      Social History   Socioeconomic History  . Marital status: Married    Spouse name: Not on file  . Number of children: 2  . Years of education: 40  . Highest education level: Not on file  Occupational History  . Occupation: Stocker  Tobacco Use  . Smoking status: Never Smoker  . Smokeless tobacco: Never Used  Vaping Use  . Vaping Use: Never used  Substance and Sexual Activity  . Alcohol use: Yes    Comment: occasional  . Drug use: No  . Sexual activity: Yes    Birth control/protection: None  Other Topics Concern  . Not on file  Social History Narrative   Fun/Hobby: Leisure centre manager football   Social Determinants of Health   Financial Resource Strain:  Not on file  Food Insecurity: Not on file  Transportation Needs: Not on file  Physical Activity: Not on file  Stress: Not on file  Social Connections: Not on file  Intimate Partner Violence: Not on file    Current Outpatient Medications on File Prior to Visit  Medication Sig Dispense Refill  . aspirin 81 MG chewable tablet CHEW 1 TABLET (81 MG TOTAL) BY MOUTH DAILY. 36 tablet 0  . atorvastatin (LIPITOR) 40 MG tablet TAKE 1 TABLET BY MOUTH EVERY DAY 90 tablet 3  . baclofen (LIORESAL) 10 MG tablet Take 1 tablet (10 mg total) by mouth 3 (three) times daily. Prn for IBS flare 30 each 0  . Blood Glucose Monitoring Suppl (ONETOUCH VERIO) w/Device KIT 1 Device by Does not apply route 2 (two) times daily as needed. (Patient taking differently: 1 Device by Does not apply route 2 (two) times daily. E11.9) 1 kit 0  . dapagliflozin propanediol (FARXIGA) 10 MG TABS tablet Take 10 mg by mouth daily. 90 tablet 3  . Diclofenac Sodium (PENNSAID) 2 % SOLN Place 2 g onto the skin 2 (two) times daily. 112 g 3  . diclofenac sodium (VOLTAREN) 1 % GEL Apply 2 g topically 4 (four) times daily. 200 g 5  . dicyclomine (BENTYL) 10 MG capsule TAKE 1 CAPSULE (  10 MG TOTAL) BY MOUTH 4 (FOUR) TIMES DAILY - BEFORE MEALS AND AT BEDTIME. 90 capsule 3  . Erenumab-aooe (AIMOVIG) 140 MG/ML SOAJ Inject 140 mg into the skin every 30 (thirty) days. 1 pen 11  . gabapentin (NEURONTIN) 600 MG tablet Take 600 mg by mouth as needed.     [name] glucose blood (ONETOUCH VERIO) test strip 1 each by Other route 2 (two) times daily. E11.9    . hydrochlorothiazide (HYDRODIURIL) 25 MG tablet Take 1 tablet (25 mg total) by mouth daily. 90 tablet 1  . ibuprofen (ADVIL,MOTRIN) 600 MG tablet TAKE 1 TABLET(600 MG) BY MOUTH EVERY 8 HOURS AS NEEDED 30 tablet 0  . losartan (COZAAR) 100 MG tablet Take 1 tablet (100 mg total) by mouth daily. 90 tablet 1  . losartan-hydrochlorothiazide (HYZAAR) 100-25 MG tablet TAKE 1 TABLET BY MOUTH EVERY DAY 30 tablet 0  .  meloxicam (MOBIC) 15 MG tablet Take 1 tablet (15 mg total) by mouth daily. 30 tablet 5  . metFORMIN (GLUCOPHAGE-XR) 500 MG 24 hr tablet TAKE 4 TABLETS BY MOUTH DAILY WITH BREAKFAST 360 tablet 1  . methocarbamol (ROBAXIN) 500 MG tablet TAKE 1 TABLET BY MOUTH TWICE DAILY (Patient taking differently: Take 500 mg by mouth every 6 (six) hours as needed.) 20 tablet 0  . omeprazole (PRILOSEC) 40 MG capsule TAKE 1 CAPSULE BY MOUTH EVERY DAY 90 capsule 12  . OneTouch Delica Lancets 97L MISC 1 each by Does not apply route 2 (two) times daily. E11.9    . PENNSAID 2 % SOLN SMARTSIG:2 Pump Topical Twice Daily    . repaglinide (PRANDIN) 0.5 MG tablet Take 1 tablet (0.5 mg total) by mouth daily with supper. 90 tablet 3  . rizatriptan (MAXALT-MLT) 10 MG disintegrating tablet Take 1 tablet (10 mg total) by mouth as needed for migraine. May repeat in 2 hours if needed 9 tablet 11  . Semaglutide, 1 MG/DOSE, (OZEMPIC, 1 MG/DOSE,) 2 MG/1.5ML SOPN Inject 1 mg into the skin once a week. 12 pen 3  . sildenafil (REVATIO) 20 MG tablet Take 20 mg by mouth. 3-5 tabs prn    . timolol (TIMOPTIC-XR) 0.5 % ophthalmic gel-forming Place 1 drop into both eyes daily.     . verapamil (CALAN-SR) 180 MG CR tablet Take 1 tablet (180 mg total) by mouth daily. 90 tablet 1  . Vitamin D, Ergocalciferol, (DRISDOL) 1.25 MG (50000 UNIT) CAPS capsule Take 1 capsule (50,000 Units total) by mouth every 7 (seven) days. 12 capsule 0   No current facility-administered medications on file prior to visit.    Allergies  Allergen Reactions  . Pollen Extract Other (See Comments)    Stuffy nose    Family History  Problem Relation Age of Onset  . Arthritis Mother   . Stroke Mother   . Hypertension Father   . Diabetes Father   . Cancer Father        Multiple Myleoma  . Diabetes Maternal Grandfather   . Colon cancer Neg Hx   . Esophageal cancer Neg Hx   . Rectal cancer Neg Hx   . Stomach cancer Neg Hx     BP (!) 160/88   Pulse 84   Ht 5'  8" (1.727 m)   Wt 213 lb (96.6 kg)   SpO2 97%   BMI 32.39 kg/m    Review of Systems He denies nausea and hypoglycemia.      Objective:   Physical Exam VITAL SIGNS:  See vs page GENERAL: no  distress Pulses: dorsalis pedis intact bilat.   MSK: no deformity of the feet CV: no leg edema Skin:  no ulcer on the feet.  normal color and temp on the feet. Neuro: sensation is intact to touch on the feet Ext: there is bilateral onychomycosis of the toenails.    Lab Results  Component Value Date   HGBA1C 6.4 (A) 02/18/2020       Assessment & Plan:  Type 2 DM, with PN: well-controlled HTN: is noted today  Patient Instructions  Your blood pressure is high today.  Please see your primary care provider soon, to have it rechecked.    continue the same 4 diabetes medications.   check your blood sugar twice a day.  vary the time of day when you check, between before the 3 meals, and at bedtime.  also check if you have symptoms of your blood sugar being too high or too low.  please keep a record of the readings and bring it to your next appointment here (or you can bring the meter itself).  You can write it on any piece of paper.  please call us sooner if your blood sugar goes below 70, or if you have a lot of readings over 200.   Please come back for a follow-up appointment in 4 months.

## 2020-02-23 NOTE — Progress Notes (Deleted)
[place] Phone: [phone] Subjective:    I'm seeing this patient by the request  of:  Biagio Borg, MD  CC:   [id number]   12/23/2019 Patient has made good strides at this time. We will get back x-rays to further evaluate. Patient is able to do the strides and has been able to do better and has been somewhat noncompliant with a compression sleeve. Heel lift given today as well. Encouraged new shoes that I think will be beneficial. 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 Continue the gabapentin, patient is not taking the meloxicam and is only taking aspirin. Follow-up again 8 weeks  Update 02/23/2020 Jason Ortega is a 55 y.o. male coming in with complaint of right hamstring strain.  Onset-  Location Duration-  Character- Aggravating factors- Reliving factors-  Therapies tried-  Severity-     Past Medical History:  Diagnosis Date  . Allergy   . Anemia   . Arthritis   . Cataract    bilateral cataracts removed  . Depression   . Diabetes mellitus   . Frequent headaches   . GERD (gastroesophageal reflux disease)   . Glaucoma   . Hypercholesteremia   . Hypertension   . Migraines   . Sleep apnea    wears c-PAP   Past Surgical History:  Procedure Laterality Date  . bilateral cataracts removed    . CARDIAC CATHETERIZATION  12/2011  . COLONOSCOPY    . LEFT HEART CATHETERIZATION WITH CORONARY ANGIOGRAM N/A 11/12/2011   Procedure: LEFT HEART CATHETERIZATION WITH CORONARY ANGIOGRAM;  Surgeon: Laverda Page, MD;  Location: [facility] CATH LAB;  Service: Cardiovascular;  Laterality: N/A;  . SHOULDER SURGERY     Social History   Socioeconomic History  . Marital status: Married    Spouse name: Not on file  . Number of children: 2  . Years of education: 72  . Highest education level: Not on file  Occupational History  . Occupation: Stocker   Tobacco Use  . Smoking status: Never Smoker  . Smokeless tobacco: Never Used  Vaping Use  . Vaping Use: Never used  Substance and Sexual Activity  . Alcohol use: Yes    Comment: occasional  . Drug use: No  . Sexual activity: Yes    Birth control/protection: None  Other Topics Concern  . Not on file  Social History Narrative   Fun/Hobby: Leisure centre manager football   Social Determinants of Health   Financial Resource Strain: Not on file  Food Insecurity: Not on file  Transportation Needs: Not on file  Physical Activity: Not on file  Stress: Not on file  Social Connections: Not on file   Allergies  Allergen Reactions  . Pollen Extract Other (See Comments)    Stuffy nose   Family History  Problem Relation Age of Onset  . Arthritis Mother   . Stroke Mother   . Hypertension Father   . Diabetes Father   . Cancer Father        Multiple Myleoma  . Diabetes Maternal Grandfather   . Colon cancer Neg Hx   . Esophageal cancer Neg Hx   . Rectal cancer Neg Hx   . Stomach cancer Neg Hx     Current Outpatient Medications (Endocrine & Metabolic):  .  dapagliflozin propanediol (FARXIGA) 10 MG TABS tablet, Take 10 mg by mouth daily. .  metFORMIN (  GLUCOPHAGE-XR) 500 MG 24 hr tablet, TAKE 4 TABLETS BY MOUTH DAILY WITH BREAKFAST .  repaglinide (PRANDIN) 0.5 MG tablet, Take 1 tablet (0.5 mg total) by mouth daily with supper. .  Semaglutide, 1 MG/DOSE, (OZEMPIC, 1 MG/DOSE,) 2 MG/1.5ML SOPN, Inject 1 mg into the skin once a week.  Current Outpatient Medications (Cardiovascular):  .  atorvastatin (LIPITOR) 40 MG tablet, TAKE 1 TABLET BY MOUTH EVERY DAY .  hydrochlorothiazide (HYDRODIURIL) 25 MG tablet, Take 1 tablet (25 mg total) by mouth daily. [name]  losartan (COZAAR) 100 MG tablet, Take 1 tablet (100 mg total) by mouth daily. [name]  losartan-hydrochlorothiazide (HYZAAR) 100-25 MG tablet, TAKE 1 TABLET BY MOUTH EVERY DAY .  sildenafil (REVATIO) 20 MG tablet, Take 20 mg by mouth. 3-5 tabs prn .   verapamil (CALAN-SR) 180 MG CR tablet, Take 1 tablet (180 mg total) by mouth daily.   Current Outpatient Medications (Analgesics):  .  aspirin 81 MG chewable tablet, CHEW 1 TABLET (81 MG TOTAL) BY MOUTH DAILY. [name]  Erenumab-aooe (AIMOVIG) 140 MG/ML SOAJ, Inject 140 mg into the skin every 30 (thirty) days. [name]  ibuprofen (ADVIL,MOTRIN) 600 MG tablet, TAKE 1 TABLET(600 MG) BY MOUTH EVERY 8 HOURS AS NEEDED .  meloxicam (MOBIC) 15 MG tablet, Take 1 tablet (15 mg total) by mouth daily. .  rizatriptan (MAXALT-MLT) 10 MG disintegrating tablet, Take 1 tablet (10 mg total) by mouth as needed for migraine. May repeat in 2 hours if needed   Current Outpatient Medications (Other):  .  baclofen (LIORESAL) 10 MG tablet, Take 1 tablet (10 mg total) by mouth 3 (three) times daily. Prn for IBS flare .  Blood Glucose Monitoring Suppl (ONETOUCH VERIO) w/Device KIT, 1 Device by Does not apply route 2 (two) times daily as needed. (Patient taking differently: 1 Device by Does not apply route 2 (two) times daily. E11.9) .  Diclofenac Sodium (PENNSAID) 2 % SOLN, Place 2 g onto the skin 2 (two) times daily. .  diclofenac sodium (VOLTAREN) 1 % GEL, Apply 2 g topically 4 (four) times daily. [name]  dicyclomine (BENTYL) 10 MG capsule, TAKE 1 CAPSULE (10 MG TOTAL) BY MOUTH 4 (FOUR) TIMES DAILY - BEFORE MEALS AND AT BEDTIME. [name]  gabapentin (NEURONTIN) 600 MG tablet, Take 600 mg by mouth as needed.  [name]  glucose blood (ONETOUCH VERIO) test strip, 1 each by Other route 2 (two) times daily. E11.9 .  methocarbamol (ROBAXIN) 500 MG tablet, TAKE 1 TABLET BY MOUTH TWICE DAILY (Patient taking differently: Take 500 mg by mouth every 6 (six) hours as needed.) .  omeprazole (PRILOSEC) 40 MG capsule, TAKE 1 CAPSULE BY MOUTH EVERY DAY .  OneTouch Delica Lancets 30G MISC, 1 each by Does not apply route 2 (two) times daily. E11.9 .  PENNSAID 2 % SOLN, SMARTSIG:2 Pump Topical Twice Daily .  timolol (TIMOPTIC-XR) 0.5 % ophthalmic gel-forming, Place 1  drop into both eyes daily.  .  Vitamin D, Ergocalciferol, (DRISDOL) 1.25 MG (50000 UNIT) CAPS capsule, Take 1 capsule (50,000 Units total) by mouth every 7 (seven) days.   Reviewed prior external information including notes and imaging from  primary care provider As well as notes that were available from care everywhere and other healthcare systems.  Past medical history, social, surgical and family history all reviewed in electronic medical record.  No pertanent information unless stated regarding to the chief complaint.   Review of Systems:  No headache, visual changes, nausea, vomiting, diarrhea, constipation, dizziness, abdominal pain, skin rash, fevers,  chills, night sweats, weight loss, swollen lymph nodes, body aches, joint swelling, chest pain, shortness of breath, mood changes. POSITIVE muscle aches  Objective  There were no vitals taken for this visit.   General: No apparent distress alert and oriented x3 mood and affect normal, dressed appropriately.  HEENT: Pupils equal, extraocular movements intact  Respiratory: Patient's speak in full sentences and does not appear short of breath  Cardiovascular: No lower extremity edema, non tender, no erythema  Gait normal with good balance and coordination.  MSK:  Non tender with full range of motion and good stability and symmetric strength and tone of shoulders, elbows, wrist, hip, knee and ankles bilaterally.     Impression and Recommendations:     The above documentation has been reviewed and is accurate and complete Jacqualin Combes

## 2020-02-24 ENCOUNTER — Ambulatory Visit: Payer: 59 | Admitting: Family Medicine

## 2020-02-25 NOTE — Progress Notes (Deleted)
[place] Phone: [phone] Subjective:    I'm seeing this patient by the request  of:  Biagio Borg, MD  CC:   [id number]   12/23/2019 Patient has made good strides at this time. We will get back x-rays to further evaluate. Patient is able to do the strides and has been able to do better and has been somewhat noncompliant with a compression sleeve. Heel lift given today as well. Encouraged new shoes that I think will be beneficial. 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 Continue the gabapentin, patient is not taking the meloxicam and is only taking aspirin. Follow-up again 8 weeks  Update 02/26/2020 Jason Ortega is a 55 y.o. male coming in with complaint of right hamstring strain and low back pain. Patient states        Past Medical History:  Diagnosis Date  . Allergy   . Anemia   . Arthritis   . Cataract    bilateral cataracts removed  . Depression   . Diabetes mellitus   . Frequent headaches   . GERD (gastroesophageal reflux disease)   . Glaucoma   . Hypercholesteremia   . Hypertension   . Migraines   . Sleep apnea    wears c-PAP   Past Surgical History:  Procedure Laterality Date  . bilateral cataracts removed    . CARDIAC CATHETERIZATION  12/2011  . COLONOSCOPY    . LEFT HEART CATHETERIZATION WITH CORONARY ANGIOGRAM N/A 11/12/2011   Procedure: LEFT HEART CATHETERIZATION WITH CORONARY ANGIOGRAM;  Surgeon: Laverda Page, MD;  Location: [facility] CATH LAB;  Service: Cardiovascular;  Laterality: N/A;  . SHOULDER SURGERY     Social History   Socioeconomic History  . Marital status: Married    Spouse name: Not on file  . Number of children: 2  . Years of education: 66  . Highest education level: Not on file  Occupational History  . Occupation: Stocker  Tobacco Use  . Smoking status: Never Smoker  . Smokeless tobacco: Never Used   Vaping Use  . Vaping Use: Never used  Substance and Sexual Activity  . Alcohol use: Yes    Comment: occasional  . Drug use: No  . Sexual activity: Yes    Birth control/protection: None  Other Topics Concern  . Not on file  Social History Narrative   Fun/Hobby: Leisure centre manager football   Social Determinants of Health   Financial Resource Strain: Not on file  Food Insecurity: Not on file  Transportation Needs: Not on file  Physical Activity: Not on file  Stress: Not on file  Social Connections: Not on file   Allergies  Allergen Reactions  . Pollen Extract Other (See Comments)    Stuffy nose   Family History  Problem Relation Age of Onset  . Arthritis Mother   . Stroke Mother   . Hypertension Father   . Diabetes Father   . Cancer Father        Multiple Myleoma  . Diabetes Maternal Grandfather   . Colon cancer Neg Hx   . Esophageal cancer Neg Hx   . Rectal cancer Neg Hx   . Stomach cancer Neg Hx     Current Outpatient Medications (Endocrine & Metabolic):  .  dapagliflozin propanediol (FARXIGA) 10 MG TABS tablet, Take 10 mg by mouth daily. .  metFORMIN (GLUCOPHAGE-XR) 500 MG 24 hr tablet, TAKE  4 TABLETS BY MOUTH DAILY WITH BREAKFAST .  repaglinide (PRANDIN) 0.5 MG tablet, Take 1 tablet (0.5 mg total) by mouth daily with supper. .  Semaglutide, 1 MG/DOSE, (OZEMPIC, 1 MG/DOSE,) 2 MG/1.5ML SOPN, Inject 1 mg into the skin once a week.  Current Outpatient Medications (Cardiovascular):  .  atorvastatin (LIPITOR) 40 MG tablet, TAKE 1 TABLET BY MOUTH EVERY DAY .  hydrochlorothiazide (HYDRODIURIL) 25 MG tablet, Take 1 tablet (25 mg total) by mouth daily. [name]  losartan (COZAAR) 100 MG tablet, Take 1 tablet (100 mg total) by mouth daily. [name]  losartan-hydrochlorothiazide (HYZAAR) 100-25 MG tablet, TAKE 1 TABLET BY MOUTH EVERY DAY .  sildenafil (REVATIO) 20 MG tablet, Take 20 mg by mouth. 3-5 tabs prn .  verapamil (CALAN-SR) 180 MG CR tablet, Take 1 tablet (180 mg total) by mouth  daily.   Current Outpatient Medications (Analgesics):  .  aspirin 81 MG chewable tablet, CHEW 1 TABLET (81 MG TOTAL) BY MOUTH DAILY. [name]  Erenumab-aooe (AIMOVIG) 140 MG/ML SOAJ, Inject 140 mg into the skin every 30 (thirty) days. [name]  ibuprofen (ADVIL,MOTRIN) 600 MG tablet, TAKE 1 TABLET(600 MG) BY MOUTH EVERY 8 HOURS AS NEEDED .  meloxicam (MOBIC) 15 MG tablet, Take 1 tablet (15 mg total) by mouth daily. .  rizatriptan (MAXALT-MLT) 10 MG disintegrating tablet, Take 1 tablet (10 mg total) by mouth as needed for migraine. May repeat in 2 hours if needed   Current Outpatient Medications (Other):  .  baclofen (LIORESAL) 10 MG tablet, Take 1 tablet (10 mg total) by mouth 3 (three) times daily. Prn for IBS flare .  Blood Glucose Monitoring Suppl (ONETOUCH VERIO) w/Device KIT, 1 Device by Does not apply route 2 (two) times daily as needed. (Patient taking differently: 1 Device by Does not apply route 2 (two) times daily. E11.9) .  Diclofenac Sodium (PENNSAID) 2 % SOLN, Place 2 g onto the skin 2 (two) times daily. .  diclofenac sodium (VOLTAREN) 1 % GEL, Apply 2 g topically 4 (four) times daily. [name]  dicyclomine (BENTYL) 10 MG capsule, TAKE 1 CAPSULE (10 MG TOTAL) BY MOUTH 4 (FOUR) TIMES DAILY - BEFORE MEALS AND AT BEDTIME. [name]  gabapentin (NEURONTIN) 600 MG tablet, Take 600 mg by mouth as needed.  [name]  glucose blood (ONETOUCH VERIO) test strip, 1 each by Other route 2 (two) times daily. E11.9 .  methocarbamol (ROBAXIN) 500 MG tablet, TAKE 1 TABLET BY MOUTH TWICE DAILY (Patient taking differently: Take 500 mg by mouth every 6 (six) hours as needed.) .  omeprazole (PRILOSEC) 40 MG capsule, TAKE 1 CAPSULE BY MOUTH EVERY DAY .  OneTouch Delica Lancets 29N MISC, 1 each by Does not apply route 2 (two) times daily. E11.9 .  PENNSAID 2 % SOLN, SMARTSIG:2 Pump Topical Twice Daily .  timolol (TIMOPTIC-XR) 0.5 % ophthalmic gel-forming, Place 1 drop into both eyes daily.  .  Vitamin D, Ergocalciferol, (DRISDOL) 1.25 MG  (50000 UNIT) CAPS capsule, Take 1 capsule (50,000 Units total) by mouth every 7 (seven) days.   Reviewed prior external information including notes and imaging from  primary care provider As well as notes that were available from care everywhere and other healthcare systems.  Past medical history, social, surgical and family history all reviewed in electronic medical record.  No pertanent information unless stated regarding to the chief complaint.   Review of Systems:  No headache, visual changes, nausea, vomiting, diarrhea, constipation, dizziness, abdominal pain, skin rash, fevers, chills, night sweats, weight loss, swollen lymph  nodes, body aches, joint swelling, chest pain, shortness of breath, mood changes. POSITIVE muscle aches  Objective  There were no vitals taken for this visit.   General: No apparent distress alert and oriented x3 mood and affect normal, dressed appropriately.  HEENT: Pupils equal, extraocular movements intact  Respiratory: Patient's speak in full sentences and does not appear short of breath  Cardiovascular: No lower extremity edema, non tender, no erythema  Gait normal with good balance and coordination.  MSK:  Non tender with full range of motion and good stability and symmetric strength and tone of shoulders, elbows, wrist, hip, knee and ankles bilaterally.     Impression and Recommendations:     The above documentation has been reviewed and is accurate and complete Jacqualin Combes

## 2020-02-26 ENCOUNTER — Ambulatory Visit: Payer: 59 | Admitting: Family Medicine

## 2020-02-26 ENCOUNTER — Ambulatory Visit (INDEPENDENT_AMBULATORY_CARE_PROVIDER_SITE_OTHER): Payer: 59 | Admitting: Family Medicine

## 2020-02-26 ENCOUNTER — Other Ambulatory Visit: Payer: Self-pay

## 2020-02-26 ENCOUNTER — Encounter: Payer: Self-pay | Admitting: Family Medicine

## 2020-02-26 VITALS — BP 122/92 | HR 77 | Ht 68.0 in | Wt 213.0 lb

## 2020-02-26 DIAGNOSIS — M5442 Lumbago with sciatica, left side: Secondary | ICD-10-CM

## 2020-02-26 DIAGNOSIS — M94261 Chondromalacia, right knee: Secondary | ICD-10-CM

## 2020-02-26 DIAGNOSIS — M545 Low back pain, unspecified: Secondary | ICD-10-CM | POA: Diagnosis not present

## 2020-02-26 DIAGNOSIS — S76311D Strain of muscle, fascia and tendon of the posterior muscle group at thigh level, right thigh, subsequent encounter: Secondary | ICD-10-CM | POA: Diagnosis not present

## 2020-02-26 DIAGNOSIS — G8929 Other chronic pain: Secondary | ICD-10-CM

## 2020-02-26 DIAGNOSIS — M5441 Lumbago with sciatica, right side: Secondary | ICD-10-CM

## 2020-02-26 NOTE — Assessment & Plan Note (Signed)
Patient does have mild degenerative disc disease at the L3-L4 level that would give him some of the radicular symptoms into the thigh and into the hamstring somewhat.  Patient could have potentially spinal stenosis and patient could be a candidate for epidurals.  Due to patient's other comorbidities including hypertension and diabetes symptoms with medications and difficult to do on a regular basis.  Patient has done gabapentin and can continue that and does take ibuprofen intermittently.  Patient will continue the same work restrictions follow-up with me after imaging to discuss further treatment options

## 2020-02-26 NOTE — Assessment & Plan Note (Signed)
Patient has had this for now over 2 years.  Had made some progress initially but I do feel that an MRI is necessary.  Patient will have an MRI to further evaluate.  See if patient needs any surgical intervention or the potential for any type of gel injections that could be helpful.  Patient will follow-up with me again after imaging

## 2020-02-26 NOTE — Progress Notes (Signed)
[place] Phone: [phone] Subjective:   Jason Ortega, am serving as a scribe for Dr. Hulan Saas. This visit occurred during the SARS-CoV-2 public health emergency.  Safety protocols were in place, including screening questions prior to the visit, additional usage of staff PPE, and extensive cleaning of exam room while observing appropriate contact time as indicated for disinfecting solutions.   I'm seeing this patient by the request  of:  Biagio Borg, MD  CC: Back and knee pain follow-up  [id number]   12/23/2019 Patient has made good strides at this time. We will get back x-rays to further evaluate. Patient is able to do the strides and has been able to do better and has been somewhat noncompliant with a compression sleeve. Heel lift given today as well. Encouraged new shoes that I think will be beneficial. 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 Continue the gabapentin, patient is not taking the meloxicam and is only taking aspirin. Follow-up again 8 weeks  Update 1/211/2022 Jason Ortega is a 55 y.o. male coming in with complaint of LBP and right hamstring pain. Patient feels weakness in right knee. Patient using bilateral thigh compression sleeves for pain relief. Patient feels the same as last visit. Patient notes that left thigh compression sleeve was not fitting due to a decrease in the size of left quad but today it does fit which correlates with an increase in pain in left side of back and left leg. Not using anything for pain. Every few weeks his pain increases and then subsides after a few days.  Patient has been dealing with this for nearly 2 years.  Comes and goes.  Patient states recently the right knee seems to be worsening.  Having some increase in instability.  Starting to affect his job on a more regular basis.  Patient states also having more  pain in the back.  Seems to still be in the same area.  Can radiate into the thigh.  Patient denies any fevers chills or any abnormal weight loss.  Patient is able to continue to push through it.  Takes over-the-counter medications intermittently.     Past Medical History:  Diagnosis Date   Allergy    Anemia    Arthritis    Cataract    bilateral cataracts removed   Depression    Diabetes mellitus    Frequent headaches    GERD (gastroesophageal reflux disease)    Glaucoma    Hypercholesteremia    Hypertension    Migraines    Sleep apnea    wears c-PAP   Past Surgical History:  Procedure Laterality Date   bilateral cataracts removed     CARDIAC CATHETERIZATION  12/2011   COLONOSCOPY     LEFT HEART CATHETERIZATION WITH CORONARY ANGIOGRAM N/A 11/12/2011   Procedure: LEFT HEART CATHETERIZATION WITH CORONARY ANGIOGRAM;  Surgeon: Laverda Page, MD;  Location: [facility] CATH LAB;  Service: Cardiovascular;  Laterality: N/A;   SHOULDER SURGERY     Social History   Socioeconomic History   Marital status: Married    Spouse name: Not on file   Number of children: 2   Years of education: 13   Highest education level: Not on file  Occupational History   Occupation: Stocker  Tobacco Use   Smoking status: Never Smoker   Smokeless tobacco: Never Used  Media planner  Vaping Use: Never used  Substance and Sexual Activity   Alcohol use: Yes    Comment: occasional   Drug use: Ortega   Sexual activity: Yes    Birth control/protection: None  Other Topics Concern   Not on file  Social History Narrative   Fun/Hobby: Leisure centre manager football   Social Determinants of Health   Financial Resource Strain: Not on file  Food Insecurity: Not on file  Transportation Needs: Not on file  Physical Activity: Not on file  Stress: Not on file  Social Connections: Not on file   Allergies  Allergen Reactions   Pollen Extract Other (See Comments)    Stuffy nose   Family History   Problem Relation Age of Onset   Arthritis Mother    Stroke Mother    Hypertension Father    Diabetes Father    Cancer Father        Multiple Myleoma   Diabetes Maternal Grandfather    Colon cancer Neg Hx    Esophageal cancer Neg Hx    Rectal cancer Neg Hx    Stomach cancer Neg Hx     Current Outpatient Medications (Endocrine & Metabolic):    dapagliflozin propanediol (FARXIGA) 10 MG TABS tablet, Take 10 mg by mouth daily.   metFORMIN (GLUCOPHAGE-XR) 500 MG 24 hr tablet, TAKE 4 TABLETS BY MOUTH DAILY WITH BREAKFAST   repaglinide (PRANDIN) 0.5 MG tablet, Take 1 tablet (0.5 mg total) by mouth daily with supper.   Semaglutide, 1 MG/DOSE, (OZEMPIC, 1 MG/DOSE,) 2 MG/1.5ML SOPN, Inject 1 mg into the skin once a week.  Current Outpatient Medications (Cardiovascular):    atorvastatin (LIPITOR) 40 MG tablet, TAKE 1 TABLET BY MOUTH EVERY DAY   hydrochlorothiazide (HYDRODIURIL) 25 MG tablet, Take 1 tablet (25 mg total) by mouth daily.   losartan (COZAAR) 100 MG tablet, Take 1 tablet (100 mg total) by mouth daily.   losartan-hydrochlorothiazide (HYZAAR) 100-25 MG tablet, TAKE 1 TABLET BY MOUTH EVERY DAY   sildenafil (REVATIO) 20 MG tablet, Take 20 mg by mouth. 3-5 tabs prn   verapamil (CALAN-SR) 180 MG CR tablet, Take 1 tablet (180 mg total) by mouth daily.   Current Outpatient Medications (Analgesics):    aspirin 81 MG chewable tablet, CHEW 1 TABLET (81 MG TOTAL) BY MOUTH DAILY.   Erenumab-aooe (AIMOVIG) 140 MG/ML SOAJ, Inject 140 mg into the skin every 30 (thirty) days.   ibuprofen (ADVIL,MOTRIN) 600 MG tablet, TAKE 1 TABLET(600 MG) BY MOUTH EVERY 8 HOURS AS NEEDED   meloxicam (MOBIC) 15 MG tablet, Take 1 tablet (15 mg total) by mouth daily.   rizatriptan (MAXALT-MLT) 10 MG disintegrating tablet, Take 1 tablet (10 mg total) by mouth as needed for migraine. May repeat in 2 hours if needed   Current Outpatient Medications (Other):    baclofen (LIORESAL) 10 MG  tablet, Take 1 tablet (10 mg total) by mouth 3 (three) times daily. Prn for IBS flare   Blood Glucose Monitoring Suppl (ONETOUCH VERIO) w/Device KIT, 1 Device by Does not apply route 2 (two) times daily as needed. (Patient taking differently: 1 Device by Does not apply route 2 (two) times daily. E11.9)   Diclofenac Sodium (PENNSAID) 2 % SOLN, Place 2 g onto the skin 2 (two) times daily.   diclofenac sodium (VOLTAREN) 1 % GEL, Apply 2 g topically 4 (four) times daily.   dicyclomine (BENTYL) 10 MG capsule, TAKE 1 CAPSULE (10 MG TOTAL) BY MOUTH 4 (FOUR) TIMES DAILY - BEFORE MEALS AND AT BEDTIME.  gabapentin (NEURONTIN) 600 MG tablet, Take 600 mg by mouth as needed.    glucose blood (ONETOUCH VERIO) test strip, 1 each by Other route 2 (two) times daily. E11.9   methocarbamol (ROBAXIN) 500 MG tablet, TAKE 1 TABLET BY MOUTH TWICE DAILY (Patient taking differently: Take 500 mg by mouth every 6 (six) hours as needed.)   omeprazole (PRILOSEC) 40 MG capsule, TAKE 1 CAPSULE BY MOUTH EVERY DAY   OneTouch Delica Lancets 16X MISC, 1 each by Does not apply route 2 (two) times daily. E11.9   PENNSAID 2 % SOLN, SMARTSIG:2 Pump Topical Twice Daily   timolol (TIMOPTIC-XR) 0.5 % ophthalmic gel-forming, Place 1 drop into both eyes daily.    Vitamin D, Ergocalciferol, (DRISDOL) 1.25 MG (50000 UNIT) CAPS capsule, Take 1 capsule (50,000 Units total) by mouth every 7 (seven) days.   Reviewed prior external information including notes and imaging from  primary care provider As well as notes that were available from care everywhere and other healthcare systems.  Past medical history, social, surgical and family history all reviewed in electronic medical record.  Ortega pertanent information unless stated regarding to the chief complaint.   Review of Systems:  Ortega headache, visual changes, nausea, vomiting, diarrhea, constipation, dizziness, abdominal pain, skin rash, fevers, chills, night sweats, weight loss,  swollen lymph nodes, body aches, joint swelling, chest pain, shortness of breath, mood changes. POSITIVE muscle aches  Objective  Blood pressure (!) 122/92, pulse 77, height $RemoveBe'5\' 8"'qhlObqlWr$  (1.727 m), weight 213 lb (96.6 kg), SpO2 99 %.   General: Ortega apparent distress alert and oriented x3 mood and affect normal, dressed appropriately.  HEENT: Pupils equal, extraocular movements intact  Respiratory: Patient's speak in full sentences and does not appear short of breath  Cardiovascular: Ortega lower extremity edema, non tender, Ortega erythema  Gait normal with good balance and coordination.  MSK:   Right knee exam does have a positive McMurray's which is different.  Still have a patellar grind test noted.  Some mild instability noted with valgus force.  This is a little bit worse than previous exam.  Ortega significant swelling noted.  Low back exam does have a mild positive straight leg test on the right side.  Tender to palpation in the paraspinal musculature lumbar spine right greater than left.  Patient Ortega pain in the thigh itself noted.  Ortega pain over the pelvic bone.  Seems to be neurovascularly intact distally.    Impression and Recommendations:     The above documentation has been reviewed and is accurate and complete Lyndal Pulley, DO

## 2020-02-26 NOTE — Patient Instructions (Signed)
MRI lumbar and MRI right knee We will touch base after I receive resutls

## 2020-03-03 ENCOUNTER — Ambulatory Visit (INDEPENDENT_AMBULATORY_CARE_PROVIDER_SITE_OTHER): Payer: 59

## 2020-03-03 ENCOUNTER — Encounter: Payer: Self-pay | Admitting: Internal Medicine

## 2020-03-03 ENCOUNTER — Ambulatory Visit (INDEPENDENT_AMBULATORY_CARE_PROVIDER_SITE_OTHER): Payer: 59 | Admitting: Internal Medicine

## 2020-03-03 ENCOUNTER — Other Ambulatory Visit: Payer: Self-pay

## 2020-03-03 VITALS — BP 146/84 | HR 74 | Temp 98.1°F | Ht 68.0 in | Wt 217.0 lb

## 2020-03-03 DIAGNOSIS — R079 Chest pain, unspecified: Secondary | ICD-10-CM | POA: Diagnosis not present

## 2020-03-03 DIAGNOSIS — Z0001 Encounter for general adult medical examination with abnormal findings: Secondary | ICD-10-CM | POA: Diagnosis not present

## 2020-03-03 DIAGNOSIS — E119 Type 2 diabetes mellitus without complications: Secondary | ICD-10-CM

## 2020-03-03 DIAGNOSIS — R1310 Dysphagia, unspecified: Secondary | ICD-10-CM

## 2020-03-03 DIAGNOSIS — E559 Vitamin D deficiency, unspecified: Secondary | ICD-10-CM

## 2020-03-03 DIAGNOSIS — I10 Essential (primary) hypertension: Secondary | ICD-10-CM | POA: Diagnosis not present

## 2020-03-03 DIAGNOSIS — K219 Gastro-esophageal reflux disease without esophagitis: Secondary | ICD-10-CM

## 2020-03-03 LAB — CBC WITH DIFFERENTIAL/PLATELET
Basophils Absolute: 0 10*3/uL (ref 0.0–0.1)
Basophils Relative: 0.5 % (ref 0.0–3.0)
Eosinophils Absolute: 0.1 10*3/uL (ref 0.0–0.7)
Eosinophils Relative: 2.2 % (ref 0.0–5.0)
HCT: 42 % (ref 39.0–52.0)
Hemoglobin: 13.7 g/dL (ref 13.0–17.0)
Lymphocytes Relative: 35.4 % (ref 12.0–46.0)
Lymphs Abs: 1.6 10*3/uL (ref 0.7–4.0)
MCHC: 32.5 g/dL (ref 30.0–36.0)
MCV: 81.5 fl (ref 78.0–100.0)
Monocytes Absolute: 0.4 10*3/uL (ref 0.1–1.0)
Monocytes Relative: 9 % (ref 3.0–12.0)
Neutro Abs: 2.5 10*3/uL (ref 1.4–7.7)
Neutrophils Relative %: 52.9 % (ref 43.0–77.0)
Platelets: 265 10*3/uL (ref 150.0–400.0)
RBC: 5.16 Mil/uL (ref 4.22–5.81)
RDW: 14.8 % (ref 11.5–15.5)
WBC: 4.6 10*3/uL (ref 4.0–10.5)

## 2020-03-03 LAB — LIPID PANEL
Cholesterol: 124 mg/dL (ref 0–200)
HDL: 50 mg/dL (ref >39.00)
LDL Cholesterol: 60 mg/dL (ref 0–99)
NonHDL: 73.52
Total CHOL/HDL Ratio: 2
Triglycerides: 67 mg/dL (ref 0.0–149.0)
VLDL: 13.4 mg/dL (ref 0.0–40.0)

## 2020-03-03 LAB — HEPATIC FUNCTION PANEL
ALT: 21 U/L (ref 0–53)
AST: 20 U/L (ref 0–37)
Albumin: 4.3 g/dL (ref 3.5–5.2)
Alkaline Phosphatase: 51 U/L (ref 39–117)
Bilirubin, Direct: 0.1 mg/dL (ref 0.0–0.3)
Total Bilirubin: 0.4 mg/dL (ref 0.2–1.2)
Total Protein: 7.7 g/dL (ref 6.0–8.3)

## 2020-03-03 LAB — URINALYSIS, ROUTINE W REFLEX MICROSCOPIC
Bilirubin Urine: NEGATIVE
Hgb urine dipstick: NEGATIVE
Ketones, ur: NEGATIVE
Leukocytes,Ua: NEGATIVE
Nitrite: NEGATIVE
RBC / HPF: NONE SEEN (ref 0–?)
Specific Gravity, Urine: 1.015 (ref 1.000–1.030)
Total Protein, Urine: NEGATIVE
Urine Glucose: >=1000 — AB
Urobilinogen, UA: 0.2 (ref 0.0–1.0)
WBC, UA: NONE SEEN (ref 0–?)
pH: 7.5 (ref 5.0–8.0)

## 2020-03-03 LAB — MICROALBUMIN / CREATININE URINE RATIO
Creatinine,U: 92 mg/dL
Microalb Creat Ratio: 0.8 mg/g (ref 0.0–30.0)
Microalb, Ur: <0.7 mg/dL (ref 0.0–1.9)

## 2020-03-03 LAB — VITAMIN D 25 HYDROXY (VIT D DEFICIENCY, FRACTURES): VITD: 31.28 ng/mL (ref 30.00–100.00)

## 2020-03-03 LAB — BASIC METABOLIC PANEL
BUN: 15 mg/dL (ref 6–23)
CO2: 32 mEq/L (ref 19–32)
Calcium: 9.7 mg/dL (ref 8.4–10.5)
Chloride: 101 mEq/L (ref 96–112)
Creatinine, Ser: 1.13 mg/dL (ref 0.40–1.50)
GFR: 72.72 mL/min (ref >60.00)
Glucose, Bld: 94 mg/dL (ref 70–99)
Potassium: 3.9 mEq/L (ref 3.5–5.1)
Sodium: 139 mEq/L (ref 135–145)

## 2020-03-03 LAB — TSH: TSH: 1.01 u[IU]/mL (ref 0.35–4.50)

## 2020-03-03 LAB — PSA: PSA: 0.72 ng/mL (ref 0.10–4.00)

## 2020-03-03 MED ORDER — AMLODIPINE BESYLATE 5 MG PO TABS: 5.0000 mg | ORAL_TABLET | Freq: Every day | ORAL | 3 refills | Status: DC

## 2020-03-03 NOTE — Patient Instructions (Signed)
Please take all new medication as prescribed - amlodipine 5 mg per day  Please continue all other medications as before, and refills have been done if requested.  Please have the pharmacy call with any other refills you may need.  Please continue your efforts at being more active, low cholesterol diet, and weight control.  You are otherwise up to date with prevention measures today.  Please keep your appointments with your specialists as you may have planned - Endocrinology  You will be contacted regarding the referral for: Gastroenterology  Please go to the XRAY Department in the first floor for the x-ray testing  Please go to the LAB at the blood drawing area for the tests to be done  You will be contacted by phone if any changes need to be made immediately.  Otherwise, you will receive a letter about your results with an explanation, but please check with MyChart first.  Please remember to sign up for MyChart if you have not done so, as this will be important to you in the future with finding out test results, communicating by private email, and scheduling acute appointments online when needed.  Please make an Appointment to return in 6 months, or sooner if needed

## 2020-03-03 NOTE — Progress Notes (Signed)
Established Patient Office Visit  Subjective:  Patient ID: Jason Ortega, male    DOB: April 27, 1965  Age: 55 y.o. MRN: [id number]       Chief Complaint:: wellness exam and Follow-up  HTN uncontrolled, low vit d, dsyphagia new onset, and CP       HPI:  Jason Ortega is a 55 y.o. male here for wellness exam; pt was covid neg jan 22 after exposure. Due for routine labs.  Wt Readings from Last 3 Encounters:  03/03/20 217 lb (98.4 kg)  02/26/20 213 lb (96.6 kg)  02/18/20 213 lb (96.6 kg)   BP Readings from Last 3 Encounters:  03/03/20 (!) 146/84  02/26/20 (!) 122/92  02/18/20 (!) 160/88   Immunization History  Administered Date(s) Administered  . Influenza,inj,Quad PF,6+ Mos 12/07/2016, 12/20/2017, 10/16/2018  . Influenza-Unspecified 02/22/2020  . PFIZER(Purple Top)SARS-COV-2 Vaccination 02/06/2019, 03/02/2019, 12/07/2019  . Pneumococcal Conjugate-13 08/22/2018  . Pneumococcal Polysaccharide-23 02/05/2013  . Tdap 02/06/2012  There are no preventive care reminders to display for this patient.        lso, BP has been elevated recently at endo, and sport med last wk, Pt denies increased sob or doe, wheezing, orthopnea, PND, increased LE swelling, palpitations, dizziness or syncope, but has had mild intemittent fleeting right upper CP dull without radiation, or association with diaphoresis, sob, n/v, palps or dizziness, but randomly few over the past few wks, none for about 1 wk now.  Conts to follow DM with endo -  Pt denies polydipsia, polyuria, Pt denies new neurological symptoms such as new headache, or facial or extremity weakness or numbness  Does also have new dysphagia to solids only with hangup feeling at the upper right chest and neck, but no n/v or wt loss.  Good compliance with PPI.  Denies worsening reflux, abd pain, bowel change or blood.   Past Medical History:  Diagnosis Date  . Allergy   . Anemia   . Arthritis   . Cataract    bilateral cataracts removed  . Depression   .  Diabetes mellitus   . Frequent headaches   . GERD (gastroesophageal reflux disease)   . Glaucoma   . Hypercholesteremia   . Hypertension   . Migraines   . Sleep apnea    wears c-PAP   Past Surgical History:  Procedure Laterality Date  . bilateral cataracts removed    . CARDIAC CATHETERIZATION  12/2011  . COLONOSCOPY    . LEFT HEART CATHETERIZATION WITH CORONARY ANGIOGRAM N/A 11/12/2011   Procedure: LEFT HEART CATHETERIZATION WITH CORONARY ANGIOGRAM;  Surgeon: Laverda Page, MD;  Location: [facility] CATH LAB;  Service: Cardiovascular;  Laterality: N/A;  . SHOULDER SURGERY      reports that he has never smoked. He has never used smokeless tobacco. He reports current alcohol use. He reports that he does not use drugs. family history includes Arthritis in his mother; Cancer in his father; Diabetes in his father and maternal grandfather; Hypertension in his father; Stroke in his mother. Allergies  Allergen Reactions  . Pollen Extract Other (See Comments)    Stuffy nose   Current Outpatient Medications on File Prior to Visit  Medication Sig Dispense Refill  . aspirin 81 MG chewable tablet CHEW 1 TABLET (81 MG TOTAL) BY MOUTH DAILY. 36 tablet 0  . atorvastatin (LIPITOR) 40 MG tablet TAKE 1 TABLET BY MOUTH EVERY DAY 90 tablet 3  . baclofen (LIORESAL) 10 MG tablet Take 1 tablet (10 mg total) by mouth 3 (three)  times daily. Prn for IBS flare 30 each 0  . Blood Glucose Monitoring Suppl (ONETOUCH VERIO) w/Device KIT 1 Device by Does not apply route 2 (two) times daily as needed. (Patient taking differently: 1 Device by Does not apply route 2 (two) times daily. E11.9) 1 kit 0  . dapagliflozin propanediol (FARXIGA) 10 MG TABS tablet Take 10 mg by mouth daily. 90 tablet 3  . Diclofenac Sodium (PENNSAID) 2 % SOLN Place 2 g onto the skin 2 (two) times daily. 112 g 3  . diclofenac sodium (VOLTAREN) 1 % GEL Apply 2 g topically 4 (four) times daily. 200 g 5  . dicyclomine (BENTYL) 10 MG capsule TAKE 1  CAPSULE (10 MG TOTAL) BY MOUTH 4 (FOUR) TIMES DAILY - BEFORE MEALS AND AT BEDTIME. 90 capsule 3  . Erenumab-aooe (AIMOVIG) 140 MG/ML SOAJ Inject 140 mg into the skin every 30 (thirty) days. 1 pen 11  . gabapentin (NEURONTIN) 600 MG tablet Take 600 mg by mouth as needed.     [name] glucose blood (ONETOUCH VERIO) test strip 1 each by Other route 2 (two) times daily. E11.9    . hydrochlorothiazide (HYDRODIURIL) 25 MG tablet Take 1 tablet (25 mg total) by mouth daily. 90 tablet 1  . ibuprofen (ADVIL,MOTRIN) 600 MG tablet TAKE 1 TABLET(600 MG) BY MOUTH EVERY 8 HOURS AS NEEDED 30 tablet 0  . losartan (COZAAR) 100 MG tablet Take 1 tablet (100 mg total) by mouth daily. 90 tablet 1  . meloxicam (MOBIC) 15 MG tablet Take 1 tablet (15 mg total) by mouth daily. 30 tablet 5  . metFORMIN (GLUCOPHAGE-XR) 500 MG 24 hr tablet TAKE 4 TABLETS BY MOUTH DAILY WITH BREAKFAST 360 tablet 1  . methocarbamol (ROBAXIN) 500 MG tablet TAKE 1 TABLET BY MOUTH TWICE DAILY (Patient taking differently: Take 500 mg by mouth every 6 (six) hours as needed.) 20 tablet 0  . omeprazole (PRILOSEC) 40 MG capsule TAKE 1 CAPSULE BY MOUTH EVERY DAY 90 capsule 12  . OneTouch Delica Lancets 78G MISC 1 each by Does not apply route 2 (two) times daily. E11.9    . PENNSAID 2 % SOLN SMARTSIG:2 Pump Topical Twice Daily    . repaglinide (PRANDIN) 0.5 MG tablet Take 1 tablet (0.5 mg total) by mouth daily with supper. 90 tablet 3  . rizatriptan (MAXALT-MLT) 10 MG disintegrating tablet Take 1 tablet (10 mg total) by mouth as needed for migraine. May repeat in 2 hours if needed 9 tablet 11  . Semaglutide, 1 MG/DOSE, (OZEMPIC, 1 MG/DOSE,) 2 MG/1.5ML SOPN Inject 1 mg into the skin once a week. 12 pen 3  . sildenafil (REVATIO) 20 MG tablet Take 20 mg by mouth. 3-5 tabs prn    . timolol (TIMOPTIC-XR) 0.5 % ophthalmic gel-forming Place 1 drop into both eyes daily.     . verapamil (CALAN-SR) 180 MG CR tablet Take 1 tablet (180 mg total) by mouth daily. 90 tablet 1    No current facility-administered medications on file prior to visit.        ROS:  All others reviewed and negative.  Objective        PE:  BP (!) 146/84   Pulse 74   Temp 98.1 F (36.7 C) (Temporal)   Ht $R'5\' 8"'zY$  (1.727 m)   Wt 217 lb (98.4 kg)   SpO2 99%   BMI 32.99 kg/m                 Constitutional: Pt appears in NAD  HENT: Head: NCAT.                Right Ear: External ear normal.                 Left Ear: External ear normal.                Eyes: . Pupils are equal, round, and reactive to light. Conjunctivae and EOM are normal               Nose: without d/c or deformity               Neck: Neck supple. Gross normal ROM               Cardiovascular: Normal rate and regular rhythm.                 Pulmonary/Chest: Effort normal and breath sounds without rales or wheezing.                Abd:  Soft, NT, ND, + BS, no organomegaly               Neurological: Pt is alert. At baseline orientation, motor grossly intact               Skin: Skin is warm. No rashes, no other new lesions, LE edema - none               Psychiatric: Pt behavior is normal without agitation   Assessment/Plan:  Yonah Tangeman is a 55 y.o. Black or African American [2] male with  has a past medical history of Allergy, Anemia, Arthritis, Cataract, Depression, Diabetes mellitus, Frequent headaches, GERD (gastroesophageal reflux disease), Glaucoma, Hypercholesteremia, Hypertension, Migraines, and Sleep apnea.  Micro: none  Cardiac tracings I have personally interpreted today:  none  Pertinent Radiological findings (summarize): none   Lab Results  Component Value Date   WBC 4.6 03/03/2020   HGB 13.7 03/03/2020   HCT 42.0 03/03/2020   PLT 265.0 03/03/2020   GLUCOSE 94 03/03/2020   CHOL 124 03/03/2020   TRIG 67.0 03/03/2020   HDL 50.00 03/03/2020   LDLCALC 60 03/03/2020   ALT 21 03/03/2020   AST 20 03/03/2020   NA 139 03/03/2020   K 3.9 03/03/2020   CL 101 03/03/2020   CREATININE  1.13 03/03/2020   BUN 15 03/03/2020   CO2 32 03/03/2020   TSH 1.01 03/03/2020   PSA 0.72 03/03/2020   HGBA1C 6.4 (A) 02/18/2020   MICROALBUR <0.7 03/03/2020     Assessment & Plan:   Problem List Items Addressed This Visit      High   Encounter for well adult exam with abnormal findings - Primary    Age and sex appropriate education and counseling updated with regular exercise and diet Referrals for preventative services - none needed Immunizations addressed - none needed Smoking counseling  - none needed Evidence for depression or other mood disorder - none significant Most recent labs reviewed. I have personally reviewed and have noted: 1) the patient's medical and social history 2) The patient's current medications and supplements 3) The patient's height, weight, and BMI have been recorded in the chart       Relevant Orders   PSA (Completed)     Medium   Vitamin D deficiency    Last vitamin D Lab Results  Component Value Date   VD25OH 31.28 03/03/2020   Stable, cont oral replacement      Relevant  Orders   VITAMIN D 25 Hydroxy (Vit-D Deficiency, Fractures) (Completed)   Type 2 diabetes mellitus without complication, without long-term current use of insulin (HCC)    Lab Results  Component Value Date   HGBA1C 6.4 (A) 02/18/2020   Stable, pt to continue current medical treatment farxiga, metofrmin and f/u endo as planned      Relevant Orders   Microalbumin / creatinine urine ratio (Completed)   Lipid panel (Completed)   Hepatic function panel (Completed)   CBC with Differential/Platelet (Completed)   TSH (Completed)   Urinalysis, Routine w reflex microscopic (Completed)   Basic metabolic panel (Completed)   Hypertension, uncontrolled    BP Readings from Last 3 Encounters:  03/03/20 (!) 146/84  02/26/20 (!) 122/92  02/18/20 (!) 160/88   Uncontrolled, to add amlodipine 5 qd, continue hct and losartan, follow bp daily at home for 10 days and let us know  results  Current Outpatient Medications (Endocrine & Metabolic):  .  dapagliflozin propanediol (FARXIGA) 10 MG TABS tablet, Take 10 mg by mouth daily. .  metFORMIN (GLUCOPHAGE-XR) 500 MG 24 hr tablet, TAKE 4 TABLETS BY MOUTH DAILY WITH BREAKFAST .  repaglinide (PRANDIN) 0.5 MG tablet, Take 1 tablet (0.5 mg total) by mouth daily with supper. .  Semaglutide, 1 MG/DOSE, (OZEMPIC, 1 MG/DOSE,) 2 MG/1.5ML SOPN, Inject 1 mg into the skin once a week.  Current Outpatient Medications (Cardiovascular):  .  amLODipine (NORVASC) 5 MG tablet, Take 1 tablet (5 mg total) by mouth daily. [name]  atorvastatin (LIPITOR) 40 MG tablet, TAKE 1 TABLET BY MOUTH EVERY DAY .  hydrochlorothiazide (HYDRODIURIL) 25 MG tablet, Take 1 tablet (25 mg total) by mouth daily. [name]  losartan (COZAAR) 100 MG tablet, Take 1 tablet (100 mg total) by mouth daily. .  sildenafil (REVATIO) 20 MG tablet, Take 20 mg by mouth. 3-5 tabs prn .  verapamil (CALAN-SR) 180 MG CR tablet, Take 1 tablet (180 mg total) by mouth daily.   Current Outpatient Medications (Analgesics):  .  aspirin 81 MG chewable tablet, CHEW 1 TABLET (81 MG TOTAL) BY MOUTH DAILY. [name]  Erenumab-aooe (AIMOVIG) 140 MG/ML SOAJ, Inject 140 mg into the skin every 30 (thirty) days. [name]  ibuprofen (ADVIL,MOTRIN) 600 MG tablet, TAKE 1 TABLET(600 MG) BY MOUTH EVERY 8 HOURS AS NEEDED .  meloxicam (MOBIC) 15 MG tablet, Take 1 tablet (15 mg total) by mouth daily. .  rizatriptan (MAXALT-MLT) 10 MG disintegrating tablet, Take 1 tablet (10 mg total) by mouth as needed for migraine. May repeat in 2 hours if needed   Current Outpatient Medications (Other):  .  baclofen (LIORESAL) 10 MG tablet, Take 1 tablet (10 mg total) by mouth 3 (three) times daily. Prn for IBS flare .  Blood Glucose Monitoring Suppl (ONETOUCH VERIO) w/Device KIT, 1 Device by Does not apply route 2 (two) times daily as needed. (Patient taking differently: 1 Device by Does not apply route 2 (two) times daily. E11.9) .   Diclofenac Sodium (PENNSAID) 2 % SOLN, Place 2 g onto the skin 2 (two) times daily. .  diclofenac sodium (VOLTAREN) 1 % GEL, Apply 2 g topically 4 (four) times daily. [name]  dicyclomine (BENTYL) 10 MG capsule, TAKE 1 CAPSULE (10 MG TOTAL) BY MOUTH 4 (FOUR) TIMES DAILY - BEFORE MEALS AND AT BEDTIME. [name]  gabapentin (NEURONTIN) 600 MG tablet, Take 600 mg by mouth as needed.  [name]  glucose blood (ONETOUCH VERIO) test strip, 1 each by Other route 2 (two) times daily. E11.9 .  methocarbamol (ROBAXIN) 500 MG tablet, TAKE 1 TABLET BY MOUTH TWICE DAILY (Patient taking differently: Take 500 mg by mouth every 6 (six) hours as needed.) .  omeprazole (PRILOSEC) 40 MG capsule, TAKE 1 CAPSULE BY MOUTH EVERY DAY .  OneTouch Delica Lancets 08M MISC, 1 each by Does not apply route 2 (two) times daily. E11.9 .  PENNSAID 2 % SOLN, SMARTSIG:2 Pump Topical Twice Daily .  timolol (TIMOPTIC-XR) 0.5 % ophthalmic gel-forming, Place 1 drop into both eyes daily.        Relevant Medications   amLODipine (NORVASC) 5 MG tablet   Gastroesophageal reflux disease without esophagitis    Stable, to continue PPI      Dysphagia    Mild to mod significant new development, cont PPI, and refer GI - may need EGD      Relevant Orders   DG Chest 2 View (Completed)   Ambulatory referral to Gastroenterology   Chest pain    Atpyical, declines ecg today due to lack of time, but for cxr, have low suspicion for cardiac, and  to f/u any worsening symptoms or concerns      Relevant Orders   DG Chest 2 View (Completed)      Meds ordered this encounter  Medications  . amLODipine (NORVASC) 5 MG tablet    Sig: Take 1 tablet (5 mg total) by mouth daily.    Dispense:  90 tablet    Refill:  3    Follow-up: No follow-ups on file.    Cathlean Cower, MD 03/05/2020 7:08 PM [facility]

## 2020-03-05 ENCOUNTER — Encounter: Payer: Self-pay | Admitting: Internal Medicine

## 2020-03-05 NOTE — Assessment & Plan Note (Signed)
Last vitamin D Lab Results  Component Value Date   VD25OH 31.28 03/03/2020   Stable, cont oral replacement

## 2020-03-05 NOTE — Assessment & Plan Note (Signed)
Stable, to continue PPI

## 2020-03-05 NOTE — Assessment & Plan Note (Addendum)
Lab Results  Component Value Date   HGBA1C 6.4 (A) 02/18/2020   Stable, pt to continue current medical treatment farxiga, metofrmin and f/u endo as planned

## 2020-03-05 NOTE — Assessment & Plan Note (Signed)
BP Readings from Last 3 Encounters:  03/03/20 (!) 146/84  02/26/20 (!) 122/92  02/18/20 (!) 160/88   Uncontrolled, to add amlodipine 5 qd, continue hct and losartan, follow bp daily at home for 10 days and let us know results  Current Outpatient Medications (Endocrine & Metabolic):  .  dapagliflozin propanediol (FARXIGA) 10 MG TABS tablet, Take 10 mg by mouth daily. .  metFORMIN (GLUCOPHAGE-XR) 500 MG 24 hr tablet, TAKE 4 TABLETS BY MOUTH DAILY WITH BREAKFAST .  repaglinide (PRANDIN) 0.5 MG tablet, Take 1 tablet (0.5 mg total) by mouth daily with supper. .  Semaglutide, 1 MG/DOSE, (OZEMPIC, 1 MG/DOSE,) 2 MG/1.5ML SOPN, Inject 1 mg into the skin once a week.  Current Outpatient Medications (Cardiovascular):  .  amLODipine (NORVASC) 5 MG tablet, Take 1 tablet (5 mg total) by mouth daily. [name]  atorvastatin (LIPITOR) 40 MG tablet, TAKE 1 TABLET BY MOUTH EVERY DAY .  hydrochlorothiazide (HYDRODIURIL) 25 MG tablet, Take 1 tablet (25 mg total) by mouth daily. [name]  losartan (COZAAR) 100 MG tablet, Take 1 tablet (100 mg total) by mouth daily. .  sildenafil (REVATIO) 20 MG tablet, Take 20 mg by mouth. 3-5 tabs prn .  verapamil (CALAN-SR) 180 MG CR tablet, Take 1 tablet (180 mg total) by mouth daily.   Current Outpatient Medications (Analgesics):  .  aspirin 81 MG chewable tablet, CHEW 1 TABLET (81 MG TOTAL) BY MOUTH DAILY. [name]  Erenumab-aooe (AIMOVIG) 140 MG/ML SOAJ, Inject 140 mg into the skin every 30 (thirty) days. [name]  ibuprofen (ADVIL,MOTRIN) 600 MG tablet, TAKE 1 TABLET(600 MG) BY MOUTH EVERY 8 HOURS AS NEEDED .  meloxicam (MOBIC) 15 MG tablet, Take 1 tablet (15 mg total) by mouth daily. .  rizatriptan (MAXALT-MLT) 10 MG disintegrating tablet, Take 1 tablet (10 mg total) by mouth as needed for migraine. May repeat in 2 hours if needed   Current Outpatient Medications (Other):  .  baclofen (LIORESAL) 10 MG tablet, Take 1 tablet (10 mg total) by mouth 3 (three) times daily. Prn for IBS flare .   Blood Glucose Monitoring Suppl (ONETOUCH VERIO) w/Device KIT, 1 Device by Does not apply route 2 (two) times daily as needed. (Patient taking differently: 1 Device by Does not apply route 2 (two) times daily. E11.9) .  Diclofenac Sodium (PENNSAID) 2 % SOLN, Place 2 g onto the skin 2 (two) times daily. .  diclofenac sodium (VOLTAREN) 1 % GEL, Apply 2 g topically 4 (four) times daily. [name]  dicyclomine (BENTYL) 10 MG capsule, TAKE 1 CAPSULE (10 MG TOTAL) BY MOUTH 4 (FOUR) TIMES DAILY - BEFORE MEALS AND AT BEDTIME. [name]  gabapentin (NEURONTIN) 600 MG tablet, Take 600 mg by mouth as needed.  [name]  glucose blood (ONETOUCH VERIO) test strip, 1 each by Other route 2 (two) times daily. E11.9 .  methocarbamol (ROBAXIN) 500 MG tablet, TAKE 1 TABLET BY MOUTH TWICE DAILY (Patient taking differently: Take 500 mg by mouth every 6 (six) hours as needed.) .  omeprazole (PRILOSEC) 40 MG capsule, TAKE 1 CAPSULE BY MOUTH EVERY DAY .  OneTouch Delica Lancets 93T MISC, 1 each by Does not apply route 2 (two) times daily. E11.9 .  PENNSAID 2 % SOLN, SMARTSIG:2 Pump Topical Twice Daily .  timolol (TIMOPTIC-XR) 0.5 % ophthalmic gel-forming, Place 1 drop into both eyes daily.

## 2020-03-05 NOTE — Assessment & Plan Note (Addendum)
Atpyical, declines ecg today due to lack of time, but for cxr, have low suspicion for cardiac, and  to f/u any worsening symptoms or concerns

## 2020-03-05 NOTE — Assessment & Plan Note (Signed)

## 2020-03-05 NOTE — Assessment & Plan Note (Signed)
Mild to mod significant new development, cont PPI, and refer GI - may need EGD

## 2020-03-10 ENCOUNTER — Ambulatory Visit
Admission: RE | Admit: 2020-03-10 | Discharge: 2020-03-10 | Disposition: A | Discharge status: Home / Self Care | Payer: 59 | Source: Ambulatory Visit | Attending: Family Medicine | Admitting: Family Medicine

## 2020-03-10 ENCOUNTER — Other Ambulatory Visit: Payer: Self-pay

## 2020-03-10 DIAGNOSIS — M545 Low back pain, unspecified: Secondary | ICD-10-CM

## 2020-03-10 DIAGNOSIS — S76311D Strain of muscle, fascia and tendon of the posterior muscle group at thigh level, right thigh, subsequent encounter: Secondary | ICD-10-CM

## 2020-03-10 DIAGNOSIS — M94261 Chondromalacia, right knee: Secondary | ICD-10-CM

## 2020-04-03 ENCOUNTER — Other Ambulatory Visit: Payer: Self-pay | Admitting: Endocrinology

## 2020-04-14 ENCOUNTER — Ambulatory Visit (INDEPENDENT_AMBULATORY_CARE_PROVIDER_SITE_OTHER): Payer: 59 | Admitting: Gastroenterology

## 2020-04-14 ENCOUNTER — Encounter: Payer: Self-pay | Admitting: Gastroenterology

## 2020-04-14 VITALS — BP 132/76 | HR 85 | Ht 68.0 in | Wt 216.1 lb

## 2020-04-14 DIAGNOSIS — R1310 Dysphagia, unspecified: Secondary | ICD-10-CM | POA: Diagnosis not present

## 2020-04-14 DIAGNOSIS — K219 Gastro-esophageal reflux disease without esophagitis: Secondary | ICD-10-CM | POA: Diagnosis not present

## 2020-04-14 MED ORDER — OMEPRAZOLE 40 MG PO CPDR: 40.0000 mg | DELAYED_RELEASE_CAPSULE | Freq: Two times a day (BID) | ORAL | 0 refills | Status: DC

## 2020-04-14 NOTE — Progress Notes (Signed)
    History of Present Illness: This is a 55 year old male complaining of reflux and difficulty swallowing for a few months..  He relates after swallowing he will sometimes feel a fullness in his epigastrium lower midsternal area.  He feels sometimes his food is not completely going down.  He has been maintained on omeprazole 40 mg daily for several years which previously worked well for reflux. Denies weight loss, abdominal pain, constipation, diarrhea, change in stool caliber, melena, hematochezia, nausea, vomiting, chest pain.  Colonoscopy September 2019 for iron deficiency anemia showed diverticulosis and internal hemorrhoids.   Current Medications, Allergies, Past Medical History, Past Surgical History, Family History and Social History were reviewed in [employer] record.   Physical Exam: General: Well developed, well nourished, no acute distress Head: Normocephalic and atraumatic Eyes: Sclerae anicteric, EOMI Ears: Normal auditory acuity Mouth: Not examined, mask on during Covid-19 pandemic Lungs: Clear throughout to auscultation Heart: Regular rate and rhythm; no murmurs, rubs or bruits Abdomen: Soft, non tender and non distended. No masses, hepatosplenomegaly or hernias noted. Normal Bowel sounds Rectal: Not done Musculoskeletal: Symmetrical with no gross deformities  Pulses:  Normal pulses noted Extremities: No clubbing, cyanosis, edema or deformities noted Neurological: Alert oriented x 4, grossly nonfocal Psychological:  Alert and cooperative. Normal mood and affect   Assessment and Recommendations:  1. GERD, reflux, possible dysphagia.  Closely follow antireflux measures.  Increase omeprazole to 40 mg p.o. twice daily taken before breakfast and dinner.  Schedule EGD with possible dilation. The risks (including bleeding, perforation, infection, missed lesions, medication reactions and possible hospitalization or surgery if complications occur),  benefits, and alternatives to endoscopy with possible biopsy and possible dilation were discussed with the patient and they consent to proceed.

## 2020-04-14 NOTE — Patient Instructions (Signed)
You have been scheduled for an endoscopy. Please follow written instructions given to you at your visit today. If you use inhalers (even only as needed), please bring them with you on the day of your procedure.  We have sent the following medications to your pharmacy for you to pick up at your convenience: Omeprazole 40 mg twice daily (increase from your previous dosage)  We have given you antireflux measures to look over and follow.  If you are age 57 or younger, your body mass index should be between 19-25. Your Body mass index is 32.86 kg/m. If this is out of the aformentioned range listed, please consider follow up with your Primary Care Provider.   Due to recent changes in healthcare laws, you may see the results of your imaging and laboratory studies on MyChart before your provider has had a chance to review them.  We understand that in some cases there may be results that are confusing or concerning to you. Not all laboratory results come back in the same time frame and the provider may be waiting for multiple results in order to interpret others.  Please give [place] 48 hours in order for your provider to thoroughly review all the results before contacting the office for clarification of your results.

## 2020-04-20 ENCOUNTER — Encounter: Payer: Self-pay | Admitting: Family Medicine

## 2020-04-20 ENCOUNTER — Ambulatory Visit (INDEPENDENT_AMBULATORY_CARE_PROVIDER_SITE_OTHER): Payer: 59 | Admitting: Family Medicine

## 2020-04-20 VITALS — BP 150/96 | HR 68 | Ht 68.0 in | Wt 221.0 lb

## 2020-04-20 DIAGNOSIS — Z9989 Dependence on other enabling machines and devices: Secondary | ICD-10-CM | POA: Diagnosis not present

## 2020-04-20 DIAGNOSIS — G4733 Obstructive sleep apnea (adult) (pediatric): Secondary | ICD-10-CM | POA: Diagnosis not present

## 2020-04-20 DIAGNOSIS — G43709 Chronic migraine without aura, not intractable, without status migrainosus: Secondary | ICD-10-CM | POA: Diagnosis not present

## 2020-04-20 MED ORDER — PROPRANOLOL HCL 20 MG PO TABS: 20.0000 mg | ORAL_TABLET | Freq: Two times a day (BID) | ORAL | 3 refills | Status: DC

## 2020-04-20 MED ORDER — AIMOVIG 140 MG/ML ~~LOC~~ SOAJ: 140.0000 mg | SUBCUTANEOUS | 3 refills | Status: DC

## 2020-04-20 MED ORDER — RIZATRIPTAN BENZOATE 10 MG PO TBDP: 10.0000 mg | ORAL_TABLET | ORAL | 11 refills | Status: DC | PRN
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
## 2020-04-20 NOTE — Patient Instructions (Addendum)
Below is our plan:  We will continue Amovig every month. We will add propranolol 20mg  twice daily. Start taking 1 tablet (20mg ) daily at bedtime for 1-2 weeks then increase dose to 20mg  twice daily. Continue rizatriptan for abortive therapy. Monitor blood pressure and heart rate at home. Call me with any concerns.   Please make sure you are staying well hydrated. I recommend 50-60 ounces daily. Well balanced diet and regular exercise encouraged. Consistent sleep schedule with 6-8 hours recommended.   Please continue follow up with care team as directed.   Follow up with me in 6 months   You may receive a survey regarding today's visit. I encourage you to leave honest feed back as I do use this information to improve patient care. Thank you for seeing me today!     Propranolol Tablets What is this medicine? PROPRANOLOL (proe PRAN oh lole) is a beta blocker. It decreases the amount of work your heart has to do and helps your heart beat regularly. It treats high blood pressure and/or prevent chest pain (also called angina). It is also used after a heart attack to prevent a second one. This medicine may be used for other purposes; ask your health care provider or pharmacist if you have questions. COMMON BRAND NAME(S): Inderal What should I tell my health care provider before I take this medicine? They need to know if you have any of these conditions:  circulation problems or blood vessel disease  diabetes  history of heart attack or heart disease, vasospastic angina  kidney disease  liver disease  lung or breathing disease, like asthma or emphysema  pheochromocytoma  slow heart rate  thyroid disease  an unusual or allergic reaction to propranolol, other beta-blockers, medicines, foods, dyes, or preservatives  pregnant or trying to get pregnant  breast-feeding How should I use this medicine? Take this drug by mouth. Take it as directed on the prescription label at the same  time every day. Keep taking it unless your health care provider tells you to stop. Talk to your health care provider about the use of this drug in children. Special care may be needed. Overdosage: If you think you have taken too much of this medicine contact a poison control center or emergency room at once. NOTE: This medicine is only for you. Do not share this medicine with others. What if I miss a dose? If you miss a dose, take it as soon as you can. If it is almost time for your next dose, take only that dose. Do not take double or extra doses. What may interact with this medicine? Do not take this medicine with any of the following medications:  feverfew  phenothiazines like chlorpromazine, mesoridazine, prochlorperazine, thioridazine This medicine may also interact with the following medications:  aluminum hydroxide gel  antipyrine  antiviral medicines for HIV or AIDS  barbiturates like phenobarbital  certain medicines for blood pressure, heart disease, irregular heart beat  cimetidine  ciprofloxacin  diazepam  fluconazole  haloperidol  isoniazid  medicines for cholesterol like cholestyramine or colestipol  medicines for mental depression  medicines for migraine headache like almotriptan, eletriptan, frovatriptan, naratriptan, rizatriptan, sumatriptan, zolmitriptan  NSAIDs, medicines for pain and inflammation, like ibuprofen or naproxen  phenytoin  rifampin  teniposide  theophylline  thyroid medicines  tolbutamide  warfarin  zileuton This list may not describe all possible interactions. Give your health care provider a list of all the medicines, herbs, non-prescription drugs, or dietary supplements you use. Also  tell them if you smoke, drink alcohol, or use illegal drugs. Some items may interact with your medicine. What should I watch for while using this medicine? Visit your doctor or health care professional for regular check ups. Check your blood  pressure and pulse rate regularly. Ask your health care professional what your blood pressure and pulse rate should be, and when you should contact them. You may get drowsy or dizzy. Do not drive, use machinery, or do anything that needs mental alertness until you know how this drug affects you. Do not stand or sit up quickly, especially if you are an older patient. This reduces the risk of dizzy or fainting spells. Alcohol can make you more drowsy and dizzy. Avoid alcoholic drinks. This medicine may increase blood sugar. Ask your healthcare provider if changes in diet or medicines are needed if you have diabetes. Do not treat yourself for coughs, colds, or pain while you are taking this medicine without asking your doctor or health care professional for advice. Some ingredients may increase your blood pressure. What side effects may I notice from receiving this medicine? Side effects that you should report to your doctor or health care professional as soon as possible:  allergic reactions like skin rash, itching or hives, swelling of the face, lips, or tongue  breathing problems  cold hands or feet  difficulty sleeping, nightmares  dry peeling skin  hallucinations  muscle cramps or weakness  signs and symptoms of high blood sugar such as being more thirsty or hungry or having to urinate more than normal. You may also feel very tired or have blurry vision.  slow heart rate  swelling of the legs and ankles  vomiting Side effects that usually do not require medical attention (report to your doctor or health care professional if they continue or are bothersome):  change in sex drive or performance  diarrhea  dry sore eyes  hair loss  nausea  weak or tired This list may not describe all possible side effects. Call your doctor for medical advice about side effects. You may report side effects to FDA at 1-800-FDA-1088. Where should I keep my medicine? Keep out of the reach of  children and pets. Store at room temperature between 20 and 25 degrees C (68 and 77 degrees F). Protect from light. Throw away any unused drug after the expiration date. NOTE: This sheet is a summary. It may not cover all possible information. If you have questions about this medicine, talk to your doctor, pharmacist, or health care provider.  2021 Elsevier/Gold Standard (2018-08-29 19:25:51)  Hypertension, Adult Hypertension is another name for high blood pressure. High blood pressure forces your heart to work harder to pump blood. This can cause problems over time. There are two numbers in a blood pressure reading. There is a top number (systolic) over a bottom number (diastolic). It is best to have a blood pressure that is below 120/80. Healthy choices can help lower your blood pressure, or you may need medicine to help lower it. What are the causes? The cause of this condition is not known. Some conditions may be related to high blood pressure. What increases the risk?  Smoking.  Having type 2 diabetes mellitus, high cholesterol, or both.  Not getting enough exercise or physical activity.  Being overweight.  Having too much fat, sugar, calories, or salt (sodium) in your diet.  Drinking too much alcohol.  Having long-term (chronic) kidney disease.  Having a family history of high blood  pressure.  Age. Risk increases with age.  Race. You may be at higher risk if you are African American.  Gender. Men are at higher risk than women before age 36. After age 4, women are at higher risk than men.  Having obstructive sleep apnea.  Stress. What are the signs or symptoms?  High blood pressure may not cause symptoms. Very high blood pressure (hypertensive crisis) may cause: ? Headache. ? Feelings of worry or nervousness (anxiety). ? Shortness of breath. ? Nosebleed. ? A feeling of being sick to your stomach (nausea). ? Throwing up (vomiting). ? Changes in how you see. ? Very  bad chest pain. ? Seizures. How is this treated?  This condition is treated by making healthy lifestyle changes, such as: ? Eating healthy foods. ? Exercising more. ? Drinking less alcohol.  Your health care provider may prescribe medicine if lifestyle changes are not enough to get your blood pressure under control, and if: ? Your top number is above 130. ? Your bottom number is above 80.  Your personal target blood pressure may vary. Follow these instructions at home: Eating and drinking  If told, follow the DASH eating plan. To follow this plan: ? Fill one half of your plate at each meal with fruits and vegetables. ? Fill one fourth of your plate at each meal with whole grains. Whole grains include whole-wheat pasta, brown rice, and whole-grain bread. ? Eat or drink low-fat dairy products, such as skim milk or low-fat yogurt. ? Fill one fourth of your plate at each meal with low-fat (lean) proteins. Low-fat proteins include fish, chicken without skin, eggs, beans, and tofu. ? Avoid fatty meat, cured and processed meat, or chicken with skin. ? Avoid pre-made or processed food.  Eat less than 1,500 mg of salt each day.  Do not drink alcohol if: ? Your doctor tells you not to drink. ? You are pregnant, may be pregnant, or are planning to become pregnant.  If you drink alcohol: ? Limit how much you use to:  0-1 drink a day for women.  0-2 drinks a day for men. ? Be aware of how much alcohol is in your drink. In the [place] one drink equals one 12 oz bottle of beer (355 mL), one 5 oz glass of wine (148 mL), or one 1 oz glass of hard liquor (44 mL).   Lifestyle  Work with your doctor to stay at a healthy weight or to lose weight. Ask your doctor what the best weight is for you.  Get at least 30 minutes of exercise most days of the week. This may include walking, swimming, or biking.  Get at least 30 minutes of exercise that strengthens your muscles (resistance exercise) at  least 3 days a week. This may include lifting weights or doing Pilates.  Do not use any products that contain nicotine or tobacco, such as cigarettes, e-cigarettes, and chewing tobacco. If you need help quitting, ask your doctor.  Check your blood pressure at home as told by your doctor.  Keep all follow-up visits as told by your doctor. This is important.   Medicines  Take over-the-counter and prescription medicines only as told by your doctor. Follow directions carefully.  Do not skip doses of blood pressure medicine. The medicine does not work as well if you skip doses. Skipping doses also puts you at risk for problems.  Ask your doctor about side effects or reactions to medicines that you should watch for. Contact a doctor  if you:  Think you are having a reaction to the medicine you are taking.  Have headaches that keep coming back (recurring).  Feel dizzy.  Have swelling in your ankles.  Have trouble with your vision. Get help right away if you:  Get a very bad headache.  Start to feel mixed up (confused).  Feel weak or numb.  Feel faint.  Have very bad pain in your: ? Chest. ? Belly (abdomen).  Throw up more than once.  Have trouble breathing. Summary  Hypertension is another name for high blood pressure.  High blood pressure forces your heart to work harder to pump blood.  For most people, a normal blood pressure is less than 120/80.  Making healthy choices can help lower blood pressure. If your blood pressure does not get lower with healthy choices, you may need to take medicine. This information is not intended to replace advice given to you by your health care provider. Make sure you discuss any questions you have with your health care provider. Document Revised: 10/02/2017 Document Reviewed: 10/02/2017 Elsevier Patient Education  2021 [employer]   Migraine Headache A migraine headache is a very strong throbbing pain on one side or both sides of  your head. This type of headache can also cause other symptoms. It can last from 4 hours to 3 days. Talk with your doctor about what things may bring on (trigger) this condition. What are the causes? The exact cause of this condition is not known. This condition may be triggered or caused by:  Drinking alcohol.  Smoking.  Taking medicines, such as: ? Medicine used to treat chest pain (nitroglycerin). ? Birth control pills. ? Estrogen. ? Some blood pressure medicines.  Eating or drinking certain products.  Doing physical activity. Other things that may trigger a migraine headache include:  Having a menstrual period.  Pregnancy.  Hunger.  Stress.  Not getting enough sleep or getting too much sleep.  Weather changes.  Tiredness (fatigue). What increases the risk?  Being 4525-57 years old.  Being male.  Having a family history of migraine headaches.  Being Caucasian.  Having depression or anxiety.  Being very overweight. What are the signs or symptoms?  A throbbing pain. This pain may: ? Happen in any area of the head, such as on one side or both sides. ? Make it hard to do daily activities. ? Get worse with physical activity. ? Get worse around bright lights or loud noises.  Other symptoms may include: ? Feeling sick to your stomach (nauseous). ? Vomiting. ? Dizziness. ? Being sensitive to bright lights, loud noises, or smells.  Before you get a migraine headache, you may get warning signs (an aura). An aura may include: ? Seeing flashing lights or having blind spots. ? Seeing bright spots, halos, or zigzag lines. ? Having tunnel vision or blurred vision. ? Having numbness or a tingling feeling. ? Having trouble talking. ? Having weak muscles.  Some people have symptoms after a migraine headache (postdromal phase), such as: ? Tiredness. ? Trouble thinking (concentrating). How is this treated?  Taking medicines that: ? Relieve pain. ? Relieve the  feeling of being sick to your stomach. ? Prevent migraine headaches.  Treatment may also include: ? Having acupuncture. ? Avoiding foods that bring on migraine headaches. ? Learning ways to control your body functions (biofeedback). ? Therapy to help you know and deal with negative thoughts (cognitive behavioral therapy). Follow these instructions at home: Medicines  Take over-the-counter and  prescription medicines only as told by your doctor.  Ask your doctor if the medicine prescribed to you: ? Requires you to avoid driving or using heavy machinery. ? Can cause trouble pooping (constipation). You may need to take these steps to prevent or treat trouble pooping:  Drink enough fluid to keep your pee (urine) pale yellow.  Take over-the-counter or prescription medicines.  Eat foods that are high in fiber. These include beans, whole grains, and fresh fruits and vegetables.  Limit foods that are high in fat and sugar. These include fried or sweet foods. Lifestyle  Do not drink alcohol.  Do not use any products that contain nicotine or tobacco, such as cigarettes, e-cigarettes, and chewing tobacco. If you need help quitting, ask your doctor.  Get at least 8 hours of sleep every night.  Limit and deal with stress. General instructions  Keep a journal to find out what may bring on your migraine headaches. For example, write down: ? What you eat and drink. ? How much sleep you get. ? Any change in what you eat or drink. ? Any change in your medicines.  If you have a migraine headache: ? Avoid things that make your symptoms worse, such as bright lights. ? It may help to lie down in a dark, quiet room. ? Do not drive or use heavy machinery. ? Ask your doctor what activities are safe for you.  Keep all follow-up visits as told by your doctor. This is important.      Contact a doctor if:  You get a migraine headache that is different or worse than others you have had.  You  have more than 15 headache days in one month. Get help right away if:  Your migraine headache gets very bad.  Your migraine headache lasts longer than 72 hours.  You have a fever.  You have a stiff neck.  You have trouble seeing.  Your muscles feel weak or like you cannot control them.  You start to lose your balance a lot.  You start to have trouble walking.  You pass out (faint).  You have a seizure. Summary  A migraine headache is a very strong throbbing pain on one side or both sides of your head. These headaches can also cause other symptoms.  This condition may be treated with medicines and changes to your lifestyle.  Keep a journal to find out what may bring on your migraine headaches.  Contact a doctor if you get a migraine headache that is different or worse than others you have had.  Contact your doctor if you have more than 15 headache days in a month. This information is not intended to replace advice given to you by your health care provider. Make sure you discuss any questions you have with your health care provider. Document Revised: 05/16/2018 Document Reviewed: 03/06/2018 Elsevier Patient Education  2021 [employer].

## 2020-04-25 NOTE — Progress Notes (Signed)
[facility] [address] [place] Phone: [phone] Subjective:   I Jason Ortega am serving as a Education administrator for Dr. Hulan Saas.  This visit occurred during the SARS-CoV-2 public health emergency.  Safety protocols were in place, including screening questions prior to the visit, additional usage of staff PPE, and extensive cleaning of exam room while observing appropriate contact time as indicated for disinfecting solutions.   I'm seeing this patient by the request  of:  Biagio Borg, MD  CC: Knee pain follow-up, back pain  OLM:[id number]   02/26/2020 Patient does have mild degenerative disc disease at the L3-L4 level that would give him some of the radicular symptoms into the thigh and into the hamstring somewhat.  Patient could have potentially spinal stenosis and patient could be a candidate for epidurals.  Due to patient's other comorbidities including hypertension and diabetes symptoms with medications and difficult to do on a regular basis.  Patient has done gabapentin and can continue that and does take ibuprofen intermittently.  Patient will continue the same work restrictions follow-up with me after imaging to discuss further treatment options  Patient has had this for now over 2 years.  Had made some progress initially but I do feel that an MRI is necessary.  Patient will have an MRI to further evaluate.  See if patient needs any surgical intervention or the potential for any type of gel injections that could be helpful.  Patient will follow-up with me again after imaging  Update 04/26/2020 Burney Calzadilla is a 55 y.o. male coming in with complaint of right knee. Patient states his knee is weak today. Wants to talk about xray and MRI. Waiting on surgery approval from his job.  Patient feels like the knee is the main culprit at this point.  Patient has not responded extremely well to the conservative therapy over the course of time. Patient  has had this pain and has seen other providers before he even saw me in January 2020.  Patient has been doing conservative therapy and had difficulty doing such things as formal physical therapy secondary to Covid.  Patient did get an MRI and showed that patient had a large meniscal tear.  Patient has some moderate arthritic changes is also noted.  Patient is still having difficulty doing full duty with his job.  Does have some mild restrictions and states that he is able to do it.  Patient would like to be without the pain.  Noticing when he compensates starts giving him more back pain as well.  Patient does have meloxicam     Past Medical History:  Diagnosis Date  . Allergy   . Anemia   . Arthritis   . Cataract    bilateral cataracts removed  . Depression   . Diabetes mellitus   . Frequent headaches   . GERD (gastroesophageal reflux disease)   . Glaucoma   . Hypercholesteremia   . Hypertension   . Migraines   . Sleep apnea    wears c-PAP   Past Surgical History:  Procedure Laterality Date  . bilateral cataracts removed    . CARDIAC CATHETERIZATION  12/2011  . COLONOSCOPY    . LEFT HEART CATHETERIZATION WITH CORONARY ANGIOGRAM N/A 11/12/2011   Procedure: LEFT HEART CATHETERIZATION WITH CORONARY ANGIOGRAM;  Surgeon: Laverda Page, MD;  Location: [facility] CATH LAB;  Service: Cardiovascular;  Laterality: N/A;  . SHOULDER SURGERY     Social History   Socioeconomic  History  . Marital status: Married    Spouse name: Not on file  . Number of children: 2  . Years of education: 63  . Highest education level: Not on file  Occupational History  . Occupation: Stocker  Tobacco Use  . Smoking status: Never Smoker  . Smokeless tobacco: Never Used  Vaping Use  . Vaping Use: Never used  Substance and Sexual Activity  . Alcohol use: Yes    Comment: occasional  . Drug use: No  . Sexual activity: Yes    Birth control/protection: None  Other Topics Concern  . Not on file  Social  History Narrative   Fun/Hobby: Leisure centre manager football   Social Determinants of Health   Financial Resource Strain: Not on file  Food Insecurity: Not on file  Transportation Needs: Not on file  Physical Activity: Not on file  Stress: Not on file  Social Connections: Not on file   Allergies  Allergen Reactions  . Pollen Extract Other (See Comments)    Stuffy nose   Family History  Problem Relation Age of Onset  . Arthritis Mother   . Stroke Mother   . Hypertension Father   . Diabetes Father   . Cancer Father        Multiple Myleoma  . Diabetes Maternal Grandfather   . Colon cancer Neg Hx   . Esophageal cancer Neg Hx   . Rectal cancer Neg Hx   . Stomach cancer Neg Hx     Current Outpatient Medications (Endocrine & Metabolic):  [name]  FARXIGA 10 MG TABS tablet, TAKE 1 TABLET EVERY DAY .  metFORMIN (GLUCOPHAGE-XR) 500 MG 24 hr tablet, TAKE 4 TABLETS BY MOUTH DAILY WITH BREAKFAST .  repaglinide (PRANDIN) 0.5 MG tablet, Take 1 tablet (0.5 mg total) by mouth daily with supper. .  Semaglutide, 1 MG/DOSE, (OZEMPIC, 1 MG/DOSE,) 2 MG/1.5ML SOPN, Inject 1 mg into the skin once a week.  Current Outpatient Medications (Cardiovascular):  .  amLODipine (NORVASC) 5 MG tablet, Take 1 tablet (5 mg total) by mouth daily. [name]  atorvastatin (LIPITOR) 40 MG tablet, TAKE 1 TABLET BY MOUTH EVERY DAY .  hydrochlorothiazide (HYDRODIURIL) 25 MG tablet, Take 1 tablet (25 mg total) by mouth daily. [name]  losartan (COZAAR) 100 MG tablet, Take 1 tablet (100 mg total) by mouth daily. .  propranolol (INDERAL) 20 MG tablet, Take 1 tablet (20 mg total) by mouth 2 (two) times daily. .  sildenafil (REVATIO) 20 MG tablet, Take 20 mg by mouth. 3-5 tabs prn .  verapamil (CALAN-SR) 180 MG CR tablet, Take 1 tablet (180 mg total) by mouth daily.   Current Outpatient Medications (Analgesics):  .  aspirin 81 MG chewable tablet, CHEW 1 TABLET (81 MG TOTAL) BY MOUTH DAILY. [name]  Erenumab-aooe (AIMOVIG) 140 MG/ML SOAJ, Inject 140 mg  into the skin every 30 (thirty) days. [name]  ibuprofen (ADVIL,MOTRIN) 600 MG tablet, TAKE 1 TABLET(600 MG) BY MOUTH EVERY 8 HOURS AS NEEDED .  meloxicam (MOBIC) 15 MG tablet, Take 1 tablet (15 mg total) by mouth daily. .  rizatriptan (MAXALT-MLT) 10 MG disintegrating tablet, Take 1 tablet (10 mg total) by mouth as needed for migraine. May repeat in 2 hours if needed   Current Outpatient Medications (Other):  .  baclofen (LIORESAL) 10 MG tablet, Take 1 tablet (10 mg total) by mouth 3 (three) times daily. Prn for IBS flare .  Blood Glucose Monitoring Suppl (ONETOUCH VERIO) w/Device KIT, 1 Device by Does not apply route 2 (two)  times daily as needed. (Patient taking differently: 1 Device by Does not apply route 2 (two) times daily. E11.9) .  diclofenac sodium (VOLTAREN) 1 % GEL, Apply 2 g topically 4 (four) times daily. [name]  dicyclomine (BENTYL) 10 MG capsule, TAKE 1 CAPSULE (10 MG TOTAL) BY MOUTH 4 (FOUR) TIMES DAILY - BEFORE MEALS AND AT BEDTIME. [name]  glucose blood (ONETOUCH VERIO) test strip, 1 each by Other route 2 (two) times daily. E11.9 .  methocarbamol (ROBAXIN) 500 MG tablet, TAKE 1 TABLET BY MOUTH TWICE DAILY (Patient taking differently: Take 500 mg by mouth every 6 (six) hours as needed.) .  omeprazole (PRILOSEC) 40 MG capsule, Take 1 capsule (40 mg total) by mouth in the morning and at bedtime. Pharmacy-please d/c rx for once daily dosing. Glory Rosebush Delica Lancets 24M MISC, 1 each by Does not apply route 2 (two) times daily. E11.9 .  PENNSAID 2 % SOLN, SMARTSIG:2 Pump Topical Twice Daily .  timolol (TIMOPTIC-XR) 0.5 % ophthalmic gel-forming, Place 1 drop into both eyes daily.    Reviewed prior external information including notes and imaging from  primary care provider As well as notes that were available from care everywhere and other healthcare systems.  Past medical history, social, surgical and family history all reviewed in electronic medical record.  No pertanent information unless  stated regarding to the chief complaint.   Review of Systems:  No headache, visual changes, nausea, vomiting, diarrhea, constipation, dizziness, abdominal pain, skin rash, fevers, chills, night sweats, weight loss, swollen lymph nodes,  chest pain, shortness of breath, mood changes. POSITIVE muscle aches, body aches, joint swelling  Objective  Blood pressure (!) 130/92, pulse 72, height $RemoveBe'5\' 8"'kAnpRccUi$  (1.727 m), weight 216 lb (98 kg), SpO2 96 %.   General: No apparent distress alert and oriented x3 mood and affect normal, dressed appropriately.  HEENT: Pupils equal, extraocular movements intact  Respiratory: Patient's speak in full sentences and does not appear short of breath  Cardiovascular: No lower extremity edema, non tender, no erythema  Gait normal with good balance and coordination.  MSK: Right knee shows the patient does have tenderness to palpation over the medial joint line.  Positive McMurray's noted.  Still significant crepitus of the patellofemoral with some mild lateral tracking noted.  No significant no instability noted.    Impression and Recommendations:     The above documentation has been reviewed and is accurate and complete Lyndal Pulley, DO

## 2020-04-26 ENCOUNTER — Other Ambulatory Visit: Payer: Self-pay

## 2020-04-26 ENCOUNTER — Encounter: Payer: Self-pay | Admitting: Family Medicine

## 2020-04-26 ENCOUNTER — Ambulatory Visit (INDEPENDENT_AMBULATORY_CARE_PROVIDER_SITE_OTHER): Payer: 59 | Admitting: Family Medicine

## 2020-04-26 DIAGNOSIS — S83206A Unspecified tear of unspecified meniscus, current injury, right knee, initial encounter: Secondary | ICD-10-CM | POA: Diagnosis not present

## 2020-04-26 NOTE — Patient Instructions (Signed)
Good to see you Medical records [id number] Keep me updated if you need another referral let us know See me again when you need me

## 2020-04-26 NOTE — Assessment & Plan Note (Signed)
Patient's MRI showed that patient does have fairly significant meniscal tear noted with some derangement.  This is likely contributing to more of his discomfort and pain.  Moderate arthritic changes but I do think that patient could potentially respond fairly well to a meniscectomy.  Patient has been doing relatively well with this knee but unfortunately over the course of the last 2 years would make some improvement and then had a setback.  Now having locking aspect of it and due to failing all other conservative therapy has advanced imaging was warranted.  I do feel that patient would do really well with the potential surgical intervention and we would refer him.  Patient is checking Workmen's Comp. to see if this will be potentially covered secondary to this is likely bleeding from his initial concern in 2020.  It is somewhat difficult to say for sure when the tear potentially occurred the patient does need it fixed so we can do his regular duties.

## 2020-04-28 ENCOUNTER — Encounter: Payer: Self-pay | Admitting: Family Medicine

## 2020-05-03 ENCOUNTER — Encounter: Payer: Self-pay | Admitting: Certified Registered Nurse Anesthetist

## 2020-05-04 ENCOUNTER — Encounter: Payer: Self-pay | Admitting: Gastroenterology

## 2020-05-04 ENCOUNTER — Ambulatory Visit (AMBULATORY_SURGERY_CENTER): Payer: 59 | Admitting: Gastroenterology

## 2020-05-04 ENCOUNTER — Other Ambulatory Visit: Payer: Self-pay

## 2020-05-04 VITALS — BP 112/76 | HR 85 | Temp 97.3°F | Resp 12 | Ht 68.0 in | Wt 216.0 lb

## 2020-05-04 DIAGNOSIS — K2970 Gastritis, unspecified, without bleeding: Secondary | ICD-10-CM

## 2020-05-04 DIAGNOSIS — K449 Diaphragmatic hernia without obstruction or gangrene: Secondary | ICD-10-CM | POA: Diagnosis not present

## 2020-05-04 DIAGNOSIS — R1310 Dysphagia, unspecified: Secondary | ICD-10-CM | POA: Diagnosis present

## 2020-05-04 DIAGNOSIS — K2211 Ulcer of esophagus with bleeding: Secondary | ICD-10-CM

## 2020-05-04 DIAGNOSIS — K2100 Gastro-esophageal reflux disease with esophagitis, without bleeding: Secondary | ICD-10-CM | POA: Diagnosis not present

## 2020-05-04 DIAGNOSIS — K219 Gastro-esophageal reflux disease without esophagitis: Secondary | ICD-10-CM

## 2020-05-04 DIAGNOSIS — K2951 Unspecified chronic gastritis with bleeding: Secondary | ICD-10-CM

## 2020-05-04 MED ORDER — SODIUM CHLORIDE 0.9 % IV SOLN: 500.0000 mL | Freq: Once | INTRAVENOUS | Status: DC

## 2020-05-04 NOTE — Progress Notes (Signed)
0955 Robinul 0.1 mg IV given due large amount of secretions upon assessment.  MD made aware, vss  °

## 2020-05-04 NOTE — Progress Notes (Signed)
Report given to PACU, vss 

## 2020-05-04 NOTE — Progress Notes (Signed)
[name] vital signs. 

## 2020-05-04 NOTE — Patient Instructions (Signed)
YOU HAD AN ENDOSCOPIC PROCEDURE TODAY AT THE [facility]:   Refer to the procedure report that was given to you for any specific questions about what was found during the examination.  If the procedure report does not answer your questions, please call your gastroenterologist to clarify.  If you requested that your care partner not be given the details of your procedure findings, then the procedure report has been included in a sealed envelope for you to review at your convenience later.  YOU SHOULD EXPECT: Some feelings of bloating in the abdomen. Passage of more gas than usual.  Walking can help get rid of the air that was put into your GI tract during the procedure and reduce the bloating. If you had a lower endoscopy (such as a colonoscopy or flexible sigmoidoscopy) you may notice spotting of blood in your stool or on the toilet paper. If you underwent a bowel prep for your procedure, you may not have a normal bowel movement for a few days.  Please Note:  You might notice some irritation and congestion in your nose or some drainage.  This is from the oxygen used during your procedure.  There is no need for concern and it should clear up in a day or so.  SYMPTOMS TO REPORT IMMEDIATELY:   Following lower endoscopy (colonoscopy or flexible sigmoidoscopy):  Excessive amounts of blood in the stool  Significant tenderness or worsening of abdominal pains  Swelling of the abdomen that is new, acute  Fever of 100F or higher   Following upper endoscopy (EGD)  Vomiting of blood or coffee ground material  New chest pain or pain under the shoulder blades  Painful or persistently difficult swallowing  New shortness of breath  Fever of 100F or higher  Black, tarry-looking stools  For urgent or emergent issues, a gastroenterologist can be reached at any hour by calling [phone]. Do not use MyChart messaging for urgent concerns.    DIET:  We do recommend a small meal at first, but  then you may proceed to your regular diet.  Drink plenty of fluids but you should avoid alcoholic beverages for 24 hours.  ACTIVITY:  You should plan to take it easy for the rest of today and you should NOT DRIVE or use heavy machinery until tomorrow (because of the sedation medicines used during the test).    FOLLOW UP: Our staff will call the number listed on your records 48-72 hours following your procedure to check on you and address any questions or concerns that you may have regarding the information given to you following your procedure. If we do not reach you, we will leave a message.  We will attempt to reach you two times.  During this call, we will ask if you have developed any symptoms of COVID 19. If you develop any symptoms (ie: fever, flu-like symptoms, shortness of breath, cough etc.) before then, please call [phone].  If you test positive for Covid 19 in the 2 weeks post procedure, please call and report this information to us.    If any biopsies were taken you will be contacted by phone or by letter within the next 1-3 weeks.  Please call us at [phone] if you have not heard about the biopsies in 3 weeks.    SIGNATURES/CONFIDENTIALITY: You and/or your care partner have signed paperwork which will be entered into your electronic medical record.  These signatures attest to the fact that that the information above on   your After Visit Summary has been reviewed and is understood.  Full responsibility of the confidentiality of this discharge information lies with you and/or your care-partner. 

## 2020-05-04 NOTE — Op Note (Signed)
[facility] Patient Name: Jason Ortega Procedure Date: 05/04/2020 9:46 AM MRN: [id number] Endoscopist: Meryl DareMalcolm T Nayeli Calvert , MD Age: 55 Referring MD:  Date of Birth: May 03, 1965 Gender: Male Account #: [id number] Procedure:                Upper GI endoscopy Indications:              Dysphagia, Gastroesophageal reflux disease Medicines:                Monitored Anesthesia Care Procedure:                Pre-Anesthesia Assessment:                           - Prior to the procedure, a History and Physical                            was performed, and patient medications and                            allergies were reviewed. The patient's tolerance of                            previous anesthesia was also reviewed. The risks                            and benefits of the procedure and the sedation                            options and risks were discussed with the patient.                            All questions were answered, and informed consent                            was obtained. Prior Anticoagulants: The patient has                            taken no previous anticoagulant or antiplatelet                            agents. ASA Grade Assessment: II - A patient with                            mild systemic disease. After reviewing the risks                            and benefits, the patient was deemed in                            satisfactory condition to undergo the procedure.                           After obtaining informed consent, the endoscope was  passed under direct vision. Throughout the                            procedure, the patient's blood pressure, pulse, and                            oxygen saturations were monitored continuously. The                            Endoscope was introduced through the mouth, and                            advanced to the second part of duodenum. The upper                            GI endoscopy  was accomplished without difficulty.                            The patient tolerated the procedure well. Scope In: Scope Out: Findings:                 The Z-line was variable ~ 2 cm and was found at the                            gastroesophageal junction. R/O Barrett's. Biopsies                            were taken with a cold forceps for histology.                           LA Grade A (one or more mucosal breaks less than 5                            mm, not extending between tops of 2 mucosal folds)                            esophagitis with no bleeding was found at the                            gastroesophageal junction.                           The exam of the esophagus was otherwise normal.                           A medium amount of food (residue) was found in the                            gastric fundus and in the gastric body. This                            limited the exam of the greater curvature.  Patchy mildly erythematous mucosa without bleeding                            was found in the entire examined stomach. Biopsies                            were taken with a cold forceps for histology.                           A small hiatal hernia was present.                           The exam of the stomach was otherwise normal.                           The duodenal bulb and second portion of the                            duodenum were normal. Complications:            No immediate complications. Estimated Blood Loss:     Estimated blood loss was minimal. Impression:               - Z-line variable, at the gastroesophageal                            junction. R/O Barrett's Biopsied.                           - LA Grade A reflux esophagitis with no bleeding.                           - A medium amount of food (residue) in the stomach.                           - Erythematous mucosa in the stomach. Biopsied.                           - Small  hiatal hernia.                           - Normal duodenal bulb and second portion of the                            duodenum. Recommendation:           - Resume previous diet.                           - Follow antireflux measures long term.                           - Continue present medications including omeprazole                            40 mg po bid.                           -  Await pathology results.                           - Schedule gastric emptying study at the next                            available appointment.                           - GI office appt after GES completed. Meryl Dare, MD 05/04/2020 10:18:28 AM This report has been signed electronically.

## 2020-05-04 NOTE — Progress Notes (Signed)
Gastric emptying study to be scheduled and GI office appt after GES completed.

## 2020-05-04 NOTE — Progress Notes (Signed)
Called to room to assist during endoscopic procedure.  Patient ID and intended procedure confirmed with present staff. Received instructions for my participation in the procedure from the performing physician.  

## 2020-05-04 NOTE — Progress Notes (Signed)
Pt's states no medical or surgical changes since previsit or office visit. 

## 2020-05-06 ENCOUNTER — Telehealth: Payer: Self-pay | Admitting: *Deleted

## 2020-05-06 ENCOUNTER — Telehealth: Payer: Self-pay

## 2020-05-06 DIAGNOSIS — K219 Gastro-esophageal reflux disease without esophagitis: Secondary | ICD-10-CM

## 2020-05-06 NOTE — Telephone Encounter (Signed)
Patient notified he will be contacted by Central scheduling to schedule GES.  He will send me a MyChart message with the date and time of the scan and we will then arrange follow up.

## 2020-05-06 NOTE — Telephone Encounter (Signed)
  Follow up Call-  Call back number 05/04/2020 10/17/2017  Post procedure Call Back phone  # [phone] #[phone] cell  Permission to leave phone message Yes Yes  Some recent data might be hidden     Patient questions:  Do you have a fever, pain , or abdominal swelling? No. Pain Score  0 *  Have you tolerated food without any problems? Yes.    Have you been able to return to your normal activities? Yes.    Do you have any questions about your discharge instructions: Diet   No. Medications  No. Follow up visit  No.  Do you have questions or concerns about your Care? No.  Actions: * If pain score is 4 or above: 1. No action needed, pain <4.Have you developed a fever since your procedure? no  2.   Have you had an respiratory symptoms (SOB or cough) since your procedure? no  3.   Have you tested positive for COVID 19 since your procedure no  4.   Have you had any family members/close contacts diagnosed with the COVID 19 since your procedure?  no   If yes to any of these questions please route to Laverna Peace, RN and Karlton Lemon, RN

## 2020-05-11 ENCOUNTER — Encounter: Payer: Self-pay | Admitting: Gastroenterology

## 2020-05-19 ENCOUNTER — Telehealth: Payer: Self-pay | Admitting: Internal Medicine

## 2020-05-19 NOTE — Telephone Encounter (Signed)
Patient states that his BP has been high while being on Amlodipine. Per patient, BP has been as high as 145/100; and sometimes higher. Patient spoke with pharmacist and placed himself back on the Losartan-HCTZ 100-25mg . Please advise if patient can have further refills. Patient would also like refill of the verapamil.

## 2020-05-19 NOTE — Telephone Encounter (Signed)
Needs ROV since chart shows he already takes the amlodipine (so verapamil is not usually taken with this) and losartan 100

## 2020-05-19 NOTE — Telephone Encounter (Signed)
Patient calling, states he needs to talk to someone about his medication. Would not expand any further.

## 2020-05-23 NOTE — Telephone Encounter (Signed)
Patient scheduled 05/27/20

## 2020-05-25 ENCOUNTER — Telehealth: Payer: Self-pay | Admitting: *Deleted

## 2020-05-25 ENCOUNTER — Encounter: Payer: Self-pay | Admitting: *Deleted

## 2020-05-25 NOTE — Telephone Encounter (Signed)
[employer] PA, key: [id number], [id number], failed: topamax, gabapentin, robaxin, zofran on verapamil and valsartan for BP.

## 2020-05-25 NOTE — Telephone Encounter (Signed)
[facility]: [employer] denied, must try both Ajovy, Emgality. Will advise patient to use savings card.

## 2020-05-27 ENCOUNTER — Encounter: Payer: Self-pay | Admitting: Internal Medicine

## 2020-05-27 ENCOUNTER — Ambulatory Visit (INDEPENDENT_AMBULATORY_CARE_PROVIDER_SITE_OTHER): Payer: 59 | Admitting: Internal Medicine

## 2020-05-27 ENCOUNTER — Other Ambulatory Visit: Payer: Self-pay

## 2020-05-27 VITALS — BP 150/80 | HR 71 | Temp 97.8°F | Ht 68.0 in | Wt 215.0 lb

## 2020-05-27 DIAGNOSIS — I10 Essential (primary) hypertension: Secondary | ICD-10-CM

## 2020-05-27 DIAGNOSIS — E119 Type 2 diabetes mellitus without complications: Secondary | ICD-10-CM

## 2020-05-27 DIAGNOSIS — E785 Hyperlipidemia, unspecified: Secondary | ICD-10-CM

## 2020-05-27 DIAGNOSIS — B353 Tinea pedis: Secondary | ICD-10-CM | POA: Insufficient documentation

## 2020-05-27 DIAGNOSIS — B351 Tinea unguium: Secondary | ICD-10-CM | POA: Insufficient documentation

## 2020-05-27 MED ORDER — VERAPAMIL HCL ER 180 MG PO TBCR
180.0000 mg | EXTENDED_RELEASE_TABLET | Freq: Every day | ORAL | 3 refills | Status: DC
Start: 2020-05-27 — End: 2021-08-25

## 2020-05-27 MED ORDER — LOSARTAN POTASSIUM-HCTZ 100-25 MG PO TABS: 1.0000 | ORAL_TABLET | Freq: Every day | ORAL | 3 refills | Status: DC

## 2020-05-27 NOTE — Progress Notes (Signed)
Patient ID: Jason Ortega, male   DOB: 10-01-1965, 55 y.o.   MRN: [id number]        Chief Complaint: follow up HTN       HPI:  Jason Ortega is a 55 y.o. male here with c/o ome confusion about BP meds due to several recent changes and wants to go back to losartan HCT and verapamil QHS since this did well before   Pt denies chest pain, increased sob or doe, wheezing, orthopnea, PND, increased LE swelling, palpitations, dizziness or syncope.  Denies new focal neuro s/s.   Pt denies polydipsia, polyuria,   Also has known right knee meniscal tear, still waiting for workmans comp to authorize surgury.    Pt denies fever, wt loss, night sweats, loss of appetite, or other constitutional symptoms   Pt denies fever, wt loss, night sweats, loss of appetite, or other constitutional symptoms     Wt Readings from Last 3 Encounters:  05/27/20 215 lb (97.5 kg)  05/04/20 216 lb (98 kg)  04/26/20 216 lb (98 kg)   BP Readings from Last 3 Encounters:  05/27/20 (!) 150/80  05/04/20 112/76  04/26/20 (!) 130/92         Past Medical History:  Diagnosis Date  . Allergy   . Anemia   . Arthritis   . Cataract    bilateral cataracts removed  . Depression   . Diabetes mellitus   . Frequent headaches   . GERD (gastroesophageal reflux disease)   . Glaucoma   . Hypercholesteremia   . Hypertension   . Migraines   . Sleep apnea    wears c-PAP   Past Surgical History:  Procedure Laterality Date  . bilateral cataracts removed    . CARDIAC CATHETERIZATION  12/2011  . COLONOSCOPY    . LEFT HEART CATHETERIZATION WITH CORONARY ANGIOGRAM N/A 11/12/2011   Procedure: LEFT HEART CATHETERIZATION WITH CORONARY ANGIOGRAM;  Surgeon: Laverda Page, MD;  Location: [facility] CATH LAB;  Service: Cardiovascular;  Laterality: N/A;  . SHOULDER SURGERY      reports that he has never smoked. He has never used smokeless tobacco. He reports current alcohol use. He reports that he does not use drugs. family history includes Arthritis in  his mother; Cancer in his father; Diabetes in his father and maternal grandfather; Hypertension in his father; Stroke in his mother. Allergies  Allergen Reactions  . Pollen Extract Other (See Comments)    Stuffy nose   Current Outpatient Medications on File Prior to Visit  Medication Sig Dispense Refill  . aspirin 81 MG chewable tablet CHEW 1 TABLET (81 MG TOTAL) BY MOUTH DAILY. 36 tablet 0  . atorvastatin (LIPITOR) 40 MG tablet TAKE 1 TABLET BY MOUTH EVERY DAY 90 tablet 3  . baclofen (LIORESAL) 10 MG tablet Take 1 tablet (10 mg total) by mouth 3 (three) times daily. Prn for IBS flare 30 each 0  . Blood Glucose Monitoring Suppl (ONETOUCH VERIO) w/Device KIT 1 Device by Does not apply route 2 (two) times daily as needed. (Patient taking differently: 1 Device by Does not apply route 2 (two) times daily. E11.9) 1 kit 0  . diclofenac sodium (VOLTAREN) 1 % GEL Apply 2 g topically 4 (four) times daily. 200 g 5  . dicyclomine (BENTYL) 10 MG capsule TAKE 1 CAPSULE (10 MG TOTAL) BY MOUTH 4 (FOUR) TIMES DAILY - BEFORE MEALS AND AT BEDTIME. 90 capsule 3  . Erenumab-aooe (AIMOVIG) 140 MG/ML SOAJ Inject 140 mg into the skin every  30 (thirty) days. 3 mL 3  . FARXIGA 10 MG TABS tablet TAKE 1 TABLET EVERY DAY 90 tablet 1  . glucose blood (ONETOUCH VERIO) test strip 1 each by Other route 2 (two) times daily. E11.9    . ibuprofen (ADVIL,MOTRIN) 600 MG tablet TAKE 1 TABLET(600 MG) BY MOUTH EVERY 8 HOURS AS NEEDED 30 tablet 0  . meloxicam (MOBIC) 15 MG tablet Take 1 tablet (15 mg total) by mouth daily. 30 tablet 5  . metFORMIN (GLUCOPHAGE-XR) 500 MG 24 hr tablet TAKE 4 TABLETS BY MOUTH DAILY WITH BREAKFAST 360 tablet 1  . methocarbamol (ROBAXIN) 500 MG tablet TAKE 1 TABLET BY MOUTH TWICE DAILY (Patient taking differently: Take 500 mg by mouth every 6 (six) hours as needed.) 20 tablet 0  . omeprazole (PRILOSEC) 40 MG capsule Take 1 capsule (40 mg total) by mouth in the morning and at bedtime. Pharmacy-please d/c rx  for once daily dosing. 180 capsule 0  . OneTouch Delica Lancets 03J MISC 1 each by Does not apply route 2 (two) times daily. E11.9    . PENNSAID 2 % SOLN SMARTSIG:2 Pump Topical Twice Daily    . propranolol (INDERAL) 20 MG tablet Take 1 tablet (20 mg total) by mouth 2 (two) times daily. 180 tablet 3  . repaglinide (PRANDIN) 0.5 MG tablet Take 1 tablet (0.5 mg total) by mouth daily with supper. 90 tablet 3  . rizatriptan (MAXALT-MLT) 10 MG disintegrating tablet Take 1 tablet (10 mg total) by mouth as needed for migraine. May repeat in 2 hours if needed 9 tablet 11  . Semaglutide, 1 MG/DOSE, (OZEMPIC, 1 MG/DOSE,) 2 MG/1.5ML SOPN Inject 1 mg into the skin once a week. 12 pen 3  . sildenafil (REVATIO) 20 MG tablet Take 20 mg by mouth. 3-5 tabs prn    . timolol (TIMOPTIC-XR) 0.5 % ophthalmic gel-forming Place 1 drop into both eyes daily.      No current facility-administered medications on file prior to visit.        ROS:  All others reviewed and negative.  Objective        PE:  BP (!) 150/80 (BP Location: Left Arm, Patient Position: Sitting, Cuff Size: Large)   Pulse 71   Temp 97.8 F (36.6 C) (Oral)   Ht $R'5\' 8"'VX$  (1.727 m)   Wt 215 lb (97.5 kg)   SpO2 99%   BMI 32.69 kg/m                 Constitutional: Pt appears in NAD               HENT: Head: NCAT.                Right Ear: External ear normal.                 Left Ear: External ear normal.                Eyes: . Pupils are equal, round, and reactive to light. Conjunctivae and EOM are normal               Nose: without d/c or deformity               Neck: Neck supple. Gross normal ROM               Cardiovascular: Normal rate and regular rhythm.                 Pulmonary/Chest: Effort normal  and breath sounds without rales or wheezing.                Abd:  Soft, NT, ND, + BS, no organomegaly               Neurological: Pt is alert. At baseline orientation, motor grossly intact               Skin: Skin is warm. No rashes, no other  new lesions, LE edema - none               Psychiatric: Pt behavior is normal without agitation   Micro: none  Cardiac tracings I have personally interpreted today:  none  Pertinent Radiological findings (summarize): none   Lab Results  Component Value Date   WBC 4.6 03/03/2020   HGB 13.7 03/03/2020   HCT 42.0 03/03/2020   PLT 265.0 03/03/2020   GLUCOSE 94 03/03/2020   CHOL 124 03/03/2020   TRIG 67.0 03/03/2020   HDL 50.00 03/03/2020   LDLCALC 60 03/03/2020   ALT 21 03/03/2020   AST 20 03/03/2020   NA 139 03/03/2020   K 3.9 03/03/2020   CL 101 03/03/2020   CREATININE 1.13 03/03/2020   BUN 15 03/03/2020   CO2 32 03/03/2020   TSH 1.01 03/03/2020   PSA 0.72 03/03/2020   HGBA1C 6.4 (A) 02/18/2020   MICROALBUR <0.7 03/03/2020   Assessment/Plan:  Jason Ortega is a 55 y.o. Black or African American [2] male with  has a past medical history of Allergy, Anemia, Arthritis, Cataract, Depression, Diabetes mellitus, Frequent headaches, GERD (gastroesophageal reflux disease), Glaucoma, Hypercholesteremia, Hypertension, Migraines, and Sleep apnea.  Hypertension, uncontrolled Ok for restart losartan hct and verapamil qhs as before,  to f/u any worsening symptoms or concerns  Type 2 diabetes mellitus without complication, without long-term current use of insulin (HCC) Lab Results  Component Value Date   HGBA1C 6.4 (A) 02/18/2020   Stable, pt to continue current medical treatment farxiga, metformin   Hyperlipidemia LDL goal <100 Lab Results  Component Value Date   LDLCALC 60 03/03/2020   Stable, pt to continue current statin lipitor 40   Followup: Return in about 6 months (around 11/26/2020).  Cathlean Cower, MD 05/29/2020 12:23 PM [facility]

## 2020-05-27 NOTE — Patient Instructions (Signed)
Please take all new medication as prescribed  (see list)  Please continue all other medications as before  Please have the pharmacy call with any other refills you may need.  Please continue your efforts at being more active, low cholesterol diet, and weight control.  Please keep your appointments with your specialists as you may have planned  Please make an Appointment to return in 6 months, or sooner if needed

## 2020-05-29 ENCOUNTER — Encounter: Payer: Self-pay | Admitting: Internal Medicine

## 2020-05-29 NOTE — Assessment & Plan Note (Signed)
Lab Results  Component Value Date   HGBA1C 6.4 (A) 02/18/2020   Stable, pt to continue current medical treatment farxiga, metformin

## 2020-05-29 NOTE — Assessment & Plan Note (Signed)
Lab Results  Component Value Date   LDLCALC 60 03/03/2020   Stable, pt to continue current statin lipitor 40

## 2020-05-29 NOTE — Assessment & Plan Note (Signed)
Ok for restart losartan hct and verapamil qhs as before,  to f/u any worsening symptoms or concerns

## 2020-06-01 ENCOUNTER — Other Ambulatory Visit: Payer: Self-pay | Admitting: Family Medicine

## 2020-06-01 NOTE — Telephone Encounter (Signed)
Pt request refill Erenumab-aooe (AIMOVIG) 140 MG/ML SOAJ at [facility] [phone]

## 2020-06-01 NOTE — Telephone Encounter (Signed)
I called pt and he stated they needed PA which was done 05-25-20 by Ambrose Pancoast RN .  aimovig denied, needing to try ajovy or emgality.  Pt will call his insurance.  He has been on for 2 yrs and doing well, stable.  Can appeal but will let Amy NP know and then proceed.  He appreciated call.

## 2020-06-01 NOTE — Telephone Encounter (Signed)
Can try copay card as well. May need to reregister. Let me know if we need to switch CGRP.

## 2020-06-02 ENCOUNTER — Telehealth: Payer: Self-pay | Admitting: *Deleted

## 2020-06-02 ENCOUNTER — Ambulatory Visit: Payer: 59 | Admitting: Family Medicine

## 2020-06-02 MED ORDER — AJOVY 225 MG/1.5ML ~~LOC~~ SOAJ: SUBCUTANEOUS | 11 refills | Status: DC

## 2020-06-02 MED ORDER — EMGALITY 120 MG/ML ~~LOC~~ SOAJ: 120.0000 mg | SUBCUTANEOUS | 11 refills | Status: DC

## 2020-06-02 NOTE — Telephone Encounter (Signed)
Per Cover My Meds, Emgality has been approved 06/02/2020 - 07/28/202. Updated pharmacy via fax. Received a receipt of confirmation.

## 2020-06-02 NOTE — Telephone Encounter (Signed)
I called aimovig, if not approved by insurance then copay savings is not able to be used.  ajovy or emgality other options.

## 2020-06-02 NOTE — Telephone Encounter (Signed)
Patient's insurance is not covering the Aimovig. Chart states pt has h/o reaction to Ajovy. Insurance requires patient try Ajovy and Emgality before Aimovig can be approved. Spoke with Amy NP and the patient. He would like to try Emgality. He could not recall the reaction to Ajovy. We discussed the Emgality is the same type of medication as the others and he would treat it the same. Dose is 120 mg every 30 days. Rx will be sent to pharmacy. PA likely needed. Pt verbalized understanding and appreciation for the call.   Aimovig discontinued. Emgality 120 mg SQ every 30 days sent to pharmacy per v.o. Amy NP.

## 2020-06-02 NOTE — Telephone Encounter (Signed)
Emgality PA completed on Cover My Meds. Key: [id number]. Awaiting determination from [facility].

## 2020-06-16 ENCOUNTER — Ambulatory Visit (HOSPITAL_COMMUNITY): Payer: 59

## 2020-06-23 ENCOUNTER — Other Ambulatory Visit: Payer: Self-pay

## 2020-06-23 ENCOUNTER — Ambulatory Visit (INDEPENDENT_AMBULATORY_CARE_PROVIDER_SITE_OTHER): Payer: 59 | Admitting: Endocrinology

## 2020-06-23 ENCOUNTER — Ambulatory Visit: Payer: 59 | Admitting: Endocrinology

## 2020-06-23 VITALS — BP 140/80 | HR 76 | Ht 68.0 in | Wt 212.6 lb

## 2020-06-23 DIAGNOSIS — E119 Type 2 diabetes mellitus without complications: Secondary | ICD-10-CM | POA: Diagnosis not present

## 2020-06-23 LAB — POCT GLYCOSYLATED HEMOGLOBIN (HGB A1C): Hemoglobin A1C: 6.4 % — AB (ref 4.0–5.6)

## 2020-06-23 MED ORDER — OZEMPIC (2 MG/DOSE) 8 MG/3ML ~~LOC~~ SOPN: 2.0000 mg | PEN_INJECTOR | SUBCUTANEOUS | 3 refills | Status: DC

## 2020-06-23 NOTE — Telephone Encounter (Signed)
Patient's GES was cancelled, pening authorization from insurance.  I sent a message to Pre-cert coordinators to follow up on auth.  Patient notified I will call him back once I hear.

## 2020-06-23 NOTE — Progress Notes (Signed)
Subjective:    Patient ID: Jason Ortega, male    DOB: Jun 09, 1965, 55 y.o.   MRN: [id number]  HPI Pt returns for f/u of diabetes mellitus:  DM type: 2 Dx'ed: 2595 Complications: PN Therapy: Ozempic and 3 oral meds DKA: never Severe hypoglycemia: never Pancreatitis: never.  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   Interval history: pt states he feels well in general.  He takes meds as rx'ed.  Pt states cbg's vary from 110-150.   Past Medical History:  Diagnosis Date  . Allergy   . Anemia   . Arthritis   . Cataract    bilateral cataracts removed  . Depression   . Diabetes mellitus   . Frequent headaches   . GERD (gastroesophageal reflux disease)   . Glaucoma   . Hypercholesteremia   . Hypertension   . Migraines   . Sleep apnea    wears c-PAP    Past Surgical History:  Procedure Laterality Date  . bilateral cataracts removed    . CARDIAC CATHETERIZATION  12/2011  . COLONOSCOPY    . LEFT HEART CATHETERIZATION WITH CORONARY ANGIOGRAM N/A 11/12/2011   Procedure: LEFT HEART CATHETERIZATION WITH CORONARY ANGIOGRAM;  Surgeon: Laverda Page, MD;  Location: [facility] CATH LAB;  Service: Cardiovascular;  Laterality: N/A;  . SHOULDER SURGERY      Social History   Socioeconomic History  . Marital status: Married    Spouse name: Not on file  . Number of children: 2  . Years of education: 76  . Highest education level: Not on file  Occupational History  . Occupation: Stocker  Tobacco Use  . Smoking status: Never Smoker  . Smokeless tobacco: Never Used  Vaping Use  . Vaping Use: Never used  Substance and Sexual Activity  . Alcohol use: Yes    Comment: occasional  . Drug use: No  . Sexual activity: Yes    Birth control/protection: None  Other Topics Concern  . Not on file  Social History Narrative   Fun/Hobby: Leisure centre manager football   Social Determinants of Health   Financial Resource Strain:  Not on file  Food Insecurity: Not on file  Transportation Needs: Not on file  Physical Activity: Not on file  Stress: Not on file  Social Connections: Not on file  Intimate Partner Violence: Not on file    Current Outpatient Medications on File Prior to Visit  Medication Sig Dispense Refill  . aspirin 81 MG chewable tablet CHEW 1 TABLET (81 MG TOTAL) BY MOUTH DAILY. 36 tablet 0  . atorvastatin (LIPITOR) 40 MG tablet TAKE 1 TABLET BY MOUTH EVERY DAY 90 tablet 3  . baclofen (LIORESAL) 10 MG tablet Take 1 tablet (10 mg total) by mouth 3 (three) times daily. Prn for IBS flare 30 each 0  . Blood Glucose Monitoring Suppl (ONETOUCH VERIO) w/Device KIT 1 Device by Does not apply route 2 (two) times daily as needed. (Patient taking differently: 1 Device by Does not apply route 2 (two) times daily. E11.9) 1 kit 0  . diclofenac sodium (VOLTAREN) 1 % GEL Apply 2 g topically 4 (four) times daily. 200 g 5  . dicyclomine (BENTYL) 10 MG capsule TAKE 1 CAPSULE (10 MG TOTAL) BY MOUTH 4 (FOUR) TIMES DAILY - BEFORE MEALS AND AT BEDTIME. 90 capsule 3  . FARXIGA 10 MG TABS tablet TAKE 1 TABLET EVERY DAY 90 tablet 1  . Galcanezumab-gnlm (EMGALITY) 120 MG/ML  SOAJ Inject 120 mg into the skin every 30 (thirty) days. 1 mL 11  . glucose blood (ONETOUCH VERIO) test strip 1 each by Other route 2 (two) times daily. E11.9    . ibuprofen (ADVIL,MOTRIN) 600 MG tablet TAKE 1 TABLET(600 MG) BY MOUTH EVERY 8 HOURS AS NEEDED 30 tablet 0  . losartan-hydrochlorothiazide (HYZAAR) 100-25 MG tablet Take 1 tablet by mouth daily. 90 tablet 3  . meloxicam (MOBIC) 15 MG tablet Take 1 tablet (15 mg total) by mouth daily. 30 tablet 5  . metFORMIN (GLUCOPHAGE-XR) 500 MG 24 hr tablet TAKE 4 TABLETS BY MOUTH DAILY WITH BREAKFAST 360 tablet 1  . methocarbamol (ROBAXIN) 500 MG tablet TAKE 1 TABLET BY MOUTH TWICE DAILY (Patient taking differently: Take 500 mg by mouth every 6 (six) hours as needed.) 20 tablet 0  . omeprazole (PRILOSEC) 40 MG  capsule Take 1 capsule (40 mg total) by mouth in the morning and at bedtime. Pharmacy-please d/c rx for once daily dosing. 180 capsule 0  . OneTouch Delica Lancets 97Q MISC 1 each by Does not apply route 2 (two) times daily. E11.9    . PENNSAID 2 % SOLN SMARTSIG:2 Pump Topical Twice Daily    . propranolol (INDERAL) 20 MG tablet Take 1 tablet (20 mg total) by mouth 2 (two) times daily. 180 tablet 3  . rizatriptan (MAXALT-MLT) 10 MG disintegrating tablet Take 1 tablet (10 mg total) by mouth as needed for migraine. May repeat in 2 hours if needed 9 tablet 11  . sildenafil (REVATIO) 20 MG tablet Take 20 mg by mouth. 3-5 tabs prn    . timolol (TIMOPTIC-XR) 0.5 % ophthalmic gel-forming Place 1 drop into both eyes daily.     . verapamil (CALAN-SR) 180 MG CR tablet Take 1 tablet (180 mg total) by mouth daily. 90 tablet 3   No current facility-administered medications on file prior to visit.    Allergies  Allergen Reactions  . Pollen Extract Other (See Comments)    Stuffy nose    Family History  Problem Relation Age of Onset  . Arthritis Mother   . Stroke Mother   . Hypertension Father   . Diabetes Father   . Cancer Father        Multiple Myleoma  . Diabetes Maternal Grandfather   . Colon cancer Neg Hx   . Esophageal cancer Neg Hx   . Rectal cancer Neg Hx   . Stomach cancer Neg Hx     BP 140/80 (BP Location: Right Arm, Patient Position: Sitting, Cuff Size: Normal)   Pulse 76   Ht 5' 8" (1.727 m)   Wt 212 lb 9.6 oz (96.4 kg)   SpO2 99%   BMI 32.33 kg/m    Review of Systems Denies n/v    Objective:   Physical Exam VITAL SIGNS:  See vs page.   GENERAL: no distress.   Pulses: dorsalis pedis intact bilat.   MSK: no deformity of the feet CV: no leg edema Skin:  no ulcer on the feet.  normal color and temp on the feet. Neuro: sensation is intact to touch on the feet.   Ext: there is bilateral onychomycosis of the toenails.    A1c=6.4%     Assessment & Plan:   Insulin-requiring type 2 DM: well-controlled Obesity: uncontrolled.  We'll favor GLP rx over repaglinide  Patient Instructions  I have sent a prescription to your pharmacy, to increase the Ozempic, and you can stop the repaglinide.   continue the same  other 2 diabetes medications.   check your blood sugar twice a day.  vary the time of day when you check, between before the 3 meals, and at bedtime.  also check if you have symptoms of your blood sugar being too high or too low.  please keep a record of the readings and bring it to your next appointment here (or you can bring the meter itself).  You can write it on any piece of paper.  please call us sooner if your blood sugar goes below 70, or if you have a lot of readings over 200.   Please come back for a follow-up appointment in 4 months.

## 2020-06-23 NOTE — Patient Instructions (Addendum)
I have sent a prescription to your pharmacy, to increase the Ozempic, and you can stop the repaglinide.   continue the same other 2 diabetes medications.   check your blood sugar twice a day.  vary the time of day when you check, between before the 3 meals, and at bedtime.  also check if you have symptoms of your blood sugar being too high or too low.  please keep a record of the readings and bring it to your next appointment here (or you can bring the meter itself).  You can write it on any piece of paper.  please call us sooner if your blood sugar goes below 70, or if you have a lot of readings over 200.   Please come back for a follow-up appointment in 4 months.

## 2020-06-23 NOTE — Telephone Encounter (Signed)
Inbound call from patient stating [facility] states they never gotten the referral/prior authorization for the scan that is needed.

## 2020-06-24 NOTE — Telephone Encounter (Signed)
Patient was contacted by Valeda Malm and is aware no pre-cert required.

## 2020-06-27 NOTE — Telephone Encounter (Signed)
Patient notified and provided the n umber to central scheduling.

## 2020-07-06 ENCOUNTER — Encounter: Payer: Self-pay | Admitting: Gastroenterology

## 2020-07-09 ENCOUNTER — Other Ambulatory Visit: Payer: Self-pay | Admitting: Endocrinology

## 2020-07-09 DIAGNOSIS — E119 Type 2 diabetes mellitus without complications: Secondary | ICD-10-CM

## 2020-07-13 ENCOUNTER — Other Ambulatory Visit: Payer: Self-pay

## 2020-07-13 ENCOUNTER — Ambulatory Visit (HOSPITAL_COMMUNITY)
Admission: RE | Admit: 2020-07-13 | Discharge: 2020-07-13 | Disposition: A | Discharge status: Home / Self Care | Payer: 59 | Source: Ambulatory Visit | Attending: Gastroenterology | Admitting: Gastroenterology

## 2020-07-13 DIAGNOSIS — K219 Gastro-esophageal reflux disease without esophagitis: Secondary | ICD-10-CM | POA: Diagnosis present

## 2020-07-13 MED ORDER — TECHNETIUM TC 99M SULFUR COLLOID
2.0000 | Freq: Once | INTRAVENOUS | Status: AC | PRN
  Administered 2020-07-13: 10:00:00 2 via INTRAVENOUS

## 2020-07-15 ENCOUNTER — Other Ambulatory Visit: Payer: Self-pay | Admitting: Gastroenterology

## 2020-08-22 ENCOUNTER — Other Ambulatory Visit: Payer: Self-pay | Admitting: Neurology

## 2020-08-22 ENCOUNTER — Encounter: Payer: Self-pay | Admitting: Internal Medicine

## 2020-08-26 ENCOUNTER — Ambulatory Visit (INDEPENDENT_AMBULATORY_CARE_PROVIDER_SITE_OTHER): Payer: 59 | Admitting: Internal Medicine

## 2020-08-26 ENCOUNTER — Encounter: Payer: Self-pay | Admitting: Internal Medicine

## 2020-08-26 ENCOUNTER — Other Ambulatory Visit: Payer: Self-pay

## 2020-08-26 VITALS — BP 130/84 | HR 82 | Temp 97.8°F | Resp 18 | Ht 68.0 in | Wt 211.2 lb

## 2020-08-26 DIAGNOSIS — J309 Allergic rhinitis, unspecified: Secondary | ICD-10-CM | POA: Diagnosis not present

## 2020-08-26 DIAGNOSIS — G459 Transient cerebral ischemic attack, unspecified: Secondary | ICD-10-CM | POA: Diagnosis not present

## 2020-08-26 DIAGNOSIS — I10 Essential (primary) hypertension: Secondary | ICD-10-CM

## 2020-08-26 DIAGNOSIS — E119 Type 2 diabetes mellitus without complications: Secondary | ICD-10-CM

## 2020-08-26 MED ORDER — ASPIRIN EC 325 MG PO TBEC: 325.0000 mg | DELAYED_RELEASE_TABLET | Freq: Every day | ORAL | 99 refills | Status: AC

## 2020-08-26 NOTE — Patient Instructions (Signed)
Ok to increase the Aspirin to 325 mg per day (the enteric coated kind)  Please continue all other medications as before, including the lipitor which is very important  Please have the pharmacy call with any other refills you may need.  Please continue your efforts at being more active, low cholesterol diet, and weight control.  Please keep your appointments with your specialists as you may have planned - Dr Lucia Gaskins (and let us know if you feel you might need a referral back)  You will be contacted regarding the referral for: MRI brain

## 2020-08-26 NOTE — Progress Notes (Signed)
Patient ID: Jason Ortega, male   DOB: [birth year], 55 y.o.   MRN: [id number]        Chief Complaint: follow up neuro symptoms       HPI:  Jason Ortega is a 55 y.o. male here with c/o episode 2 wks ago while visiting sister in [place] where he was lying down but suffered 1 min of simply not being able to speak; tongue seemed to move but he simply could not talk.  Was assoicated with right posterior HA that to him was not the sam as his usual recurring migraine, the last occurring 5 days ago, throbbing one sided, severe, with blurry vision and lasted 2 days, spent most of the time in bed.  Has been unable to get appt with neurology Dr Jaynee Eagles, so here to request  MRI head.  No other recurring symptoms of unable to talk or other new focal symtpoms .  Has good compliance with asa 81 and lipitor 40.  Also has hx of vertigo about 4 time per yr but none recently.  Pt denies chest pain, increased sob or doe, wheezing, orthopnea, PND, increased LE swelling, palpitations, dizziness or syncope.   Pt denies polydipsia, polyuria.   Pt denies fever, wt loss, night sweats, loss of appetite, or other constitutional symptoms  Does have several wks ongoing nasal allergy symptoms with clearish congestion, itch and sneezing, without fever, pain, ST, cough, swelling or wheezing.       Wt Readings from Last 3 Encounters:  08/26/20 211 lb 3.2 oz (95.8 kg)  06/23/20 212 lb 9.6 oz (96.4 kg)  05/27/20 215 lb (97.5 kg)   BP Readings from Last 3 Encounters:  08/26/20 130/84  06/23/20 140/80  05/27/20 (!) 150/80         Past Medical History:  Diagnosis Date   Allergy    Anemia    Arthritis    Cataract    bilateral cataracts removed   Depression    Diabetes mellitus    Frequent headaches    GERD (gastroesophageal reflux disease)    Glaucoma    Hypercholesteremia    Hypertension    Migraines    Sleep apnea    wears c-PAP   Past Surgical History:  Procedure Laterality Date   bilateral cataracts removed      CARDIAC CATHETERIZATION  12/2011   COLONOSCOPY     LEFT HEART CATHETERIZATION WITH CORONARY ANGIOGRAM N/A 11/12/2011   Procedure: LEFT HEART CATHETERIZATION WITH CORONARY ANGIOGRAM;  Surgeon: Laverda Page, MD;  Location: [facility] CATH LAB;  Service: Cardiovascular;  Laterality: N/A;   SHOULDER SURGERY      reports that he has never smoked. He has never used smokeless tobacco. He reports current alcohol use. He reports that he does not use drugs. family history includes Arthritis in his mother; Cancer in his father; Diabetes in his father and maternal grandfather; Hypertension in his father; Stroke in his mother. Allergies  Allergen Reactions   Pollen Extract Other (See Comments)    Stuffy nose   Current Outpatient Medications on File Prior to Visit  Medication Sig Dispense Refill   atorvastatin (LIPITOR) 40 MG tablet TAKE 1 TABLET BY MOUTH EVERY DAY 90 tablet 3   baclofen (LIORESAL) 10 MG tablet Take 1 tablet (10 mg total) by mouth 3 (three) times daily. Prn for IBS flare 30 each 0   Blood Glucose Monitoring Suppl (ONETOUCH VERIO) w/Device KIT 1 Device by Does not apply route 2 (two) times daily as needed. (Patient  taking differently: 1 Device by Does not apply route 2 (two) times daily. E11.9) 1 kit 0   diclofenac sodium (VOLTAREN) 1 % GEL Apply 2 g topically 4 (four) times daily. 200 g 5   dicyclomine (BENTYL) 10 MG capsule TAKE 1 CAPSULE (10 MG TOTAL) BY MOUTH 4 (FOUR) TIMES DAILY - BEFORE MEALS AND AT BEDTIME. 90 capsule 3   FARXIGA 10 MG TABS tablet TAKE 1 TABLET EVERY DAY 90 tablet 1   Galcanezumab-gnlm (EMGALITY) 120 MG/ML SOAJ Inject 120 mg into the skin every 30 (thirty) days. 1 mL 11   glucose blood (ONETOUCH VERIO) test strip 1 each by Other route 2 (two) times daily. E11.9     ibuprofen (ADVIL,MOTRIN) 600 MG tablet TAKE 1 TABLET(600 MG) BY MOUTH EVERY 8 HOURS AS NEEDED 30 tablet 0   losartan-hydrochlorothiazide (HYZAAR) 100-25 MG tablet Take 1 tablet by mouth daily. 90 tablet 3    meloxicam (MOBIC) 15 MG tablet Take 1 tablet (15 mg total) by mouth daily. 30 tablet 5   metFORMIN (GLUCOPHAGE-XR) 500 MG 24 hr tablet TAKE 4 TABLETS BY MOUTH DAILY WITH BREAKFAST 360 tablet 1   methocarbamol (ROBAXIN) 500 MG tablet TAKE 1 TABLET BY MOUTH TWICE DAILY (Patient taking differently: Take 500 mg by mouth every 6 (six) hours as needed.) 20 tablet 0   omeprazole (PRILOSEC) 40 MG capsule TAKE 1 CAPSULE IN THE [facility] AND AT BEDTIME 180 capsule 0   OneTouch Delica Lancets 99J MISC 1 each by Does not apply route 2 (two) times daily. E11.9     PENNSAID 2 % SOLN SMARTSIG:2 Pump Topical Twice Daily     propranolol (INDERAL) 20 MG tablet Take 1 tablet (20 mg total) by mouth 2 (two) times daily. 180 tablet 3   rizatriptan (MAXALT-MLT) 10 MG disintegrating tablet Take 1 tablet (10 mg total) by mouth as needed for migraine. May repeat in 2 hours if needed 9 tablet 11   Semaglutide, 2 MG/DOSE, (OZEMPIC, 2 MG/DOSE,) 8 MG/3ML SOPN Inject 2 mg into the skin once a week. 9 mL 3   sildenafil (REVATIO) 20 MG tablet Take 20 mg by mouth. 3-5 tabs prn     timolol (TIMOPTIC-XR) 0.5 % ophthalmic gel-forming Place 1 drop into both eyes daily.      verapamil (CALAN-SR) 180 MG CR tablet Take 1 tablet (180 mg total) by mouth daily. 90 tablet 3   No current facility-administered medications on file prior to visit.        ROS:  All others reviewed and negative.  Objective        PE:  BP 130/84   Pulse 82   Temp 97.8 F (36.6 C) (Oral)   Resp 18   Ht $R'5\' 8"'eq$  (1.727 m)   Wt 211 lb 3.2 oz (95.8 kg)   SpO2 98%   BMI 32.11 kg/m                 Constitutional: Pt appears in NAD               HENT: Head: NCAT.                Right Ear: External ear normal.                 Left Ear: External ear normal.                Eyes: . Pupils are equal, round, and reactive to light. Conjunctivae and EOM are normal  Nose: without d/c or deformity               Neck: Neck supple. Gross normal ROM                Cardiovascular: Normal rate and regular rhythm.                 Pulmonary/Chest: Effort normal and breath sounds without rales or wheezing.                Abd:  Soft, NT, ND, + BS, no organomegaly               Neurological: Pt is alert. At baseline orientation, motor grossly intact               Skin: Skin is warm. No rashes, no other new lesions, LE edema - none               Psychiatric: Pt behavior is normal without agitation   Micro: none  Cardiac tracings I have personally interpreted today:  none  Pertinent Radiological findings (summarize): none   Lab Results  Component Value Date   WBC 4.6 03/03/2020   HGB 13.7 03/03/2020   HCT 42.0 03/03/2020   PLT 265.0 03/03/2020   GLUCOSE 94 03/03/2020   CHOL 124 03/03/2020   TRIG 67.0 03/03/2020   HDL 50.00 03/03/2020   LDLCALC 60 03/03/2020   ALT 21 03/03/2020   AST 20 03/03/2020   NA 139 03/03/2020   K 3.9 03/03/2020   CL 101 03/03/2020   CREATININE 1.13 03/03/2020   BUN 15 03/03/2020   CO2 32 03/03/2020   TSH 1.01 03/03/2020   PSA 0.72 03/03/2020   HGBA1C 6.4 (A) 06/23/2020   MICROALBUR <0.7 03/03/2020   Assessment/Plan:  Jason Ortega is a 55 y.o. Black or African American [2] male with  has a past medical history of Allergy, Anemia, Arthritis, Cataract, Depression, Diabetes mellitus, Frequent headaches, GERD (gastroesophageal reflux disease), Glaucoma, Hypercholesteremia, Hypertension, Migraines, and Sleep apnea.  TIA (transient ischemic attack) Possible tia vs atypical migraine, for mri brain, f/u neurology as planned, increase asa to 325 qd, cont lipitor 40  Type 2 diabetes mellitus without complication, without long-term current use of insulin (HCC) Lab Results  Component Value Date   HGBA1C 6.4 (A) 06/23/2020   Stable, pt to continue current medical treatment - farxiga   Hypertension, uncontrolled BP Readings from Last 3 Encounters:  08/26/20 130/84  06/23/20 140/80  05/27/20 (!) 150/80   Stable, pt to  continue medical treatment hyzaar, inderall, calan   Allergic rhinitis Mild, ok for ot allegra prn,  to f/u any worsening symptoms or concerns  Followup: Return if symptoms worsen or fail to improve.  Cathlean Cower, MD 08/27/2020 6:36 PM [facility]

## 2020-08-27 ENCOUNTER — Encounter: Payer: Self-pay | Admitting: Internal Medicine

## 2020-08-27 DIAGNOSIS — G459 Transient cerebral ischemic attack, unspecified: Secondary | ICD-10-CM | POA: Insufficient documentation

## 2020-08-27 NOTE — Assessment & Plan Note (Signed)
Mild, ok for ot allegra prn,  to f/u any worsening symptoms or concerns

## 2020-08-27 NOTE — Assessment & Plan Note (Signed)
Lab Results  Component Value Date   HGBA1C 6.4 (A) 06/23/2020   Stable, pt to continue current medical treatment - farxiga

## 2020-08-27 NOTE — Assessment & Plan Note (Signed)
Possible tia vs atypical migraine, for mri brain, f/u neurology as planned, increase asa to 325 qd, cont lipitor 40

## 2020-08-27 NOTE — Assessment & Plan Note (Signed)
BP Readings from Last 3 Encounters:  08/26/20 130/84  06/23/20 140/80  05/27/20 (!) 150/80   Stable, pt to continue medical treatment hyzaar, inderall, calan

## 2020-09-07 ENCOUNTER — Other Ambulatory Visit: Payer: Self-pay

## 2020-09-07 ENCOUNTER — Ambulatory Visit
Admission: RE | Admit: 2020-09-07 | Discharge: 2020-09-07 | Disposition: A | Discharge status: Home / Self Care | Payer: 59 | Source: Ambulatory Visit | Attending: Internal Medicine | Admitting: Internal Medicine

## 2020-09-07 ENCOUNTER — Encounter: Payer: Self-pay | Admitting: Internal Medicine

## 2020-09-07 DIAGNOSIS — G459 Transient cerebral ischemic attack, unspecified: Secondary | ICD-10-CM

## 2020-09-19 ENCOUNTER — Telehealth: Payer: Self-pay | Admitting: Neurology

## 2020-09-19 NOTE — Telephone Encounter (Signed)
PA completed on [employer] KEY: [id number] Waiting for response

## 2020-10-24 NOTE — Progress Notes (Deleted)
PATIENT: Jason Ortega DOB: [birth year]  REASON FOR VISIT: follow up HISTORY FROM: patient  Virtual Visit via Telephone Note  I connected with Jason Ortega on 10/24/20 at  9:30 AM EDT by telephone and verified that I am speaking with the correct person using two identifiers.   I discussed the limitations, risks, security and privacy concerns of performing an evaluation and management service by telephone and the availability of in person appointments. I also discussed with the patient that there may be a patient responsible charge related to this service. The patient expressed understanding and agreed to proceed.   History of Present Illness:  10/24/20 ALL:  Jason Ortega returns for follow up for migraines. He continued Amovig and we added propranolol 20mg  BID at last visit 04/2020. Since, insurance no longer covers Amovig and he was switched to 05/2020.   He was seen by PCP in 08/2020 with complaints of being unable to speak for about a minute while having a headache on vacation. He felt that his tongue was moving but he could not get sound to come out. MRI was ordered and normal 09/07/2020. 11/07/2020 was increased to 325. He continues Lipitor.   CPAP?  04/20/20 ALL: He returns for follow up for migraines. He continues Amovig and rizatriptan. Also taking verapamil 180mg  daily and gabapentin 600mg  at night PRN prescribed by his PCP for HTN and sleep. He continues to have a tension style, bifrontal, dull headache everyday. He has had 2-3 migraines since January. Rizatriptan is working well for abortive therapy. He admits that BP has been a little higher than normal. Usually 130/80's at home but has been 150's/80's. He is on multiple hypertensive agents.  He is using CPAP nightly. He has not been seen by sleep provider. He called to schedule appt for January that was cancelled by the provider.  01/11/2020 ALL: Jason Ortega is a 55 y.o. male here today for follow up for migraine headaches.  He continues  Aimovig monthly.  He has continued to have regular headaches.  He reports to bad migraines over the past 8 months.  He has used rizatriptan and sumatriptan regularly, usually taking a full prescription of each medication every month.  He reports that rizatriptan seems to be a little bit more effective than sumatriptan.  He reports that he is using CPAP nightly.  He has not been seen by his sleep provider since March 2019.  He states that he has continued to receive supplies through DME.  He continues follow-up with PCP regularly.   Observations/Objective:  Generalized: Well developed, in no acute distress  Mentation: Alert oriented to time, place, history taking. Follows all commands speech and language fluent   Assessment and Plan:  55 y.o. year old male  has a past medical history of Allergy, Anemia, Arthritis, Cataract, Depression, Diabetes mellitus, Frequent headaches, GERD (gastroesophageal reflux disease), Glaucoma, Hypercholesteremia, Hypertension, Migraines, and Sleep apnea. here with  No diagnosis found.    Jason Ortega reports that headaches continue on a near daily basis, however, he has only had 2 "bad migraines" over the course of the past 8 months.  He has continued Aimovig injections every month.  He reports that he is taking a full prescription of both rizatriptan and sumatriptan every month.  I have reeducated Jason Ortega on appropriate usage of triptan therapy.  He was advised to use rizatriptan only as needed for migraine headaches.  We will discontinue sumatriptan as it seems to be less effective.  We have discussed concerns of rebound headaches.  I have also advised that he schedule follow-up as soon as possible with his sleep medicine provider at [facility].  Last compliance review shows 22% compliance in March 2019.  May consider adjustment in migraine therapy pending CPAP follow-up if patient is compliant.  Healthy lifestyle habits reviewed.  I would like to follow-up with Jason Ortega in 3  months.  He verbalizes understanding and agreement with this plan.  No orders of the defined types were placed in this encounter.   No orders of the defined types were placed in this encounter.    Follow Up Instructions:  I discussed the assessment and treatment plan with the patient. The patient was provided an opportunity to ask questions and all were answered. The patient agreed with the plan and demonstrated an understanding of the instructions.   The patient was advised to call back or seek an in-person evaluation if the symptoms worsen or if the condition fails to improve as anticipated.  I provided 15 minutes of non-face-to-face time during this encounter. Mychart visit converted to televisit due to technical concerns. Patient is located at his place of employment, provider is in the office.    Jason Dapper, NP   Made any corrections needed, and agree with history, physical, neuro exam,assessment and plan as stated.     [name], MD [facility]

## 2020-10-25 ENCOUNTER — Telehealth: Payer: 59 | Admitting: Family Medicine

## 2020-10-25 DIAGNOSIS — G43709 Chronic migraine without aura, not intractable, without status migrainosus: Secondary | ICD-10-CM

## 2020-10-25 DIAGNOSIS — G4733 Obstructive sleep apnea (adult) (pediatric): Secondary | ICD-10-CM

## 2020-10-27 ENCOUNTER — Ambulatory Visit (INDEPENDENT_AMBULATORY_CARE_PROVIDER_SITE_OTHER): Payer: 59 | Admitting: Endocrinology

## 2020-10-27 ENCOUNTER — Other Ambulatory Visit: Payer: Self-pay

## 2020-10-27 VITALS — BP 140/70 | HR 70 | Ht 68.0 in | Wt 205.8 lb

## 2020-10-27 DIAGNOSIS — E119 Type 2 diabetes mellitus without complications: Secondary | ICD-10-CM

## 2020-10-27 LAB — POCT GLYCOSYLATED HEMOGLOBIN (HGB A1C): Hemoglobin A1C: 6.2 % — AB (ref 4.0–5.6)

## 2020-10-27 NOTE — Progress Notes (Signed)
Subjective:    Patient ID: Jason Ortega, male    DOB: 1966/02/03, 55 y.o.   MRN: [id number]  HPI Pt returns for f/u of diabetes mellitus:  DM type: 2 Dx'ed: 8676 Complications: PN Therapy: Ozempic and 2 oral meds DKA: never Severe hypoglycemia: never Pancreatitis: never.  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   Interval history: pt states he feels well in general.  He takes meds as rx'ed.  Pt states cbg's are well-controlled.    Past Medical History:  Diagnosis Date   Allergy    Anemia    Arthritis    Cataract    bilateral cataracts removed   Depression    Diabetes mellitus    Frequent headaches    GERD (gastroesophageal reflux disease)    Glaucoma    Hypercholesteremia    Hypertension    Migraines    Sleep apnea    wears c-PAP    Past Surgical History:  Procedure Laterality Date   bilateral cataracts removed     CARDIAC CATHETERIZATION  12/2011   COLONOSCOPY     LEFT HEART CATHETERIZATION WITH CORONARY ANGIOGRAM N/A 11/12/2011   Procedure: LEFT HEART CATHETERIZATION WITH CORONARY ANGIOGRAM;  Surgeon: Laverda Page, MD;  Location: [facility] CATH LAB;  Service: Cardiovascular;  Laterality: N/A;   SHOULDER SURGERY      Social History   Socioeconomic History   Marital status: Single    Spouse name: Not on file   Number of children: 2   Years of education: 58   Highest education level: Not on file  Occupational History   Occupation: Clinical research associate  Tobacco Use   Smoking status: Never   Smokeless tobacco: Never  Vaping Use   Vaping Use: Never used  Substance and Sexual Activity   Alcohol use: Yes    Comment: occasional   Drug use: No   Sexual activity: Yes    Birth control/protection: None  Other Topics Concern   Not on file  Social History Narrative   Fun/Hobby: Leisure centre manager football   Social Determinants of Health   Financial Resource Strain: Not on file  Food Insecurity: Not on  file  Transportation Needs: Not on file  Physical Activity: Not on file  Stress: Not on file  Social Connections: Not on file  Intimate Partner Violence: Not on file    Current Outpatient Medications on File Prior to Visit  Medication Sig Dispense Refill   aspirin EC 325 MG tablet Take 1 tablet (325 mg total) by mouth daily. 30 tablet 99   atorvastatin (LIPITOR) 40 MG tablet TAKE 1 TABLET BY MOUTH EVERY DAY 90 tablet 3   baclofen (LIORESAL) 10 MG tablet Take 1 tablet (10 mg total) by mouth 3 (three) times daily. Prn for IBS flare 30 each 0   Blood Glucose Monitoring Suppl (ONETOUCH VERIO) w/Device KIT 1 Device by Does not apply route 2 (two) times daily as needed. (Patient taking differently: 1 Device by Does not apply route 2 (two) times daily. E11.9) 1 kit 0   diclofenac sodium (VOLTAREN) 1 % GEL Apply 2 g topically 4 (four) times daily. 200 g 5   dicyclomine (BENTYL) 10 MG capsule TAKE 1 CAPSULE (10 MG TOTAL) BY MOUTH 4 (FOUR) TIMES DAILY - BEFORE MEALS AND AT BEDTIME. 90 capsule 3   FARXIGA 10 MG TABS tablet TAKE 1 TABLET EVERY DAY 90 tablet 1   Galcanezumab-gnlm (EMGALITY) 120 MG/ML SOAJ Inject  120 mg into the skin every 30 (thirty) days. 1 mL 11   glucose blood (ONETOUCH VERIO) test strip 1 each by Other route 2 (two) times daily. E11.9     ibuprofen (ADVIL,MOTRIN) 600 MG tablet TAKE 1 TABLET(600 MG) BY MOUTH EVERY 8 HOURS AS NEEDED 30 tablet 0   losartan-hydrochlorothiazide (HYZAAR) 100-25 MG tablet Take 1 tablet by mouth daily. 90 tablet 3   meloxicam (MOBIC) 15 MG tablet Take 1 tablet (15 mg total) by mouth daily. 30 tablet 5   metFORMIN (GLUCOPHAGE-XR) 500 MG 24 hr tablet TAKE 4 TABLETS BY MOUTH DAILY WITH BREAKFAST 360 tablet 1   methocarbamol (ROBAXIN) 500 MG tablet TAKE 1 TABLET BY MOUTH TWICE DAILY (Patient taking differently: Take 500 mg by mouth every 6 (six) hours as needed.) 20 tablet 0   omeprazole (PRILOSEC) 40 MG capsule TAKE 1 CAPSULE IN THE [facility] AND AT BEDTIME 180  capsule 0   OneTouch Delica Lancets 98Y MISC 1 each by Does not apply route 2 (two) times daily. E11.9     PENNSAID 2 % SOLN SMARTSIG:2 Pump Topical Twice Daily     propranolol (INDERAL) 20 MG tablet Take 1 tablet (20 mg total) by mouth 2 (two) times daily. 180 tablet 3   rizatriptan (MAXALT-MLT) 10 MG disintegrating tablet Take 1 tablet (10 mg total) by mouth as needed for migraine. May repeat in 2 hours if needed 9 tablet 11   Semaglutide, 2 MG/DOSE, (OZEMPIC, 2 MG/DOSE,) 8 MG/3ML SOPN Inject 2 mg into the skin once a week. 9 mL 3   sildenafil (REVATIO) 20 MG tablet Take 20 mg by mouth. 3-5 tabs prn     timolol (TIMOPTIC-XR) 0.5 % ophthalmic gel-forming Place 1 drop into both eyes daily.      verapamil (CALAN-SR) 180 MG CR tablet Take 1 tablet (180 mg total) by mouth daily. 90 tablet 3   No current facility-administered medications on file prior to visit.    Allergies  Allergen Reactions   Pollen Extract Other (See Comments)    Stuffy nose    Family History  Problem Relation Age of Onset   Arthritis Mother    Stroke Mother    Hypertension Father    Diabetes Father    Cancer Father        Multiple Myleoma   Diabetes Maternal Grandfather    Colon cancer Neg Hx    Esophageal cancer Neg Hx    Rectal cancer Neg Hx    Stomach cancer Neg Hx     BP 140/70 (BP Location: Right Arm, Patient Position: Sitting, Cuff Size: Normal)   Pulse 70   Ht _0  (1.727 m)   Wt 205 lb 12.8 oz (93.4 kg)   SpO2 96%   BMI 31.29 kg/m    Review of Systems Denies N/V/HB    Objective:   Physical Exam Pulses: dorsalis pedis intact bilat.   MSK: no deformity of the feet CV: no leg edema Skin:  no ulcer on the feet.  normal color and temp on the feet.  Neuro: sensation is intact to touch on the feet.   Ext: there is bilateral onychomycosis of the toenails.    Lab Results  Component Value Date   HGBA1C 6.2 (A) 10/27/2020      Assessment & Plan:  Type 2 DM: well-controlled.   Patient  Instructions  continue the same other 3 diabetes medications.   check your blood sugar twice a day.  vary the time of day when  you check, between before the 3 meals, and at bedtime.  also check if you have symptoms of your blood sugar being too high or too low.  please keep a record of the readings and bring it to your next appointment here (or you can bring the meter itself).  You can write it on any piece of paper.  please call us sooner if your blood sugar goes below 70, or if you have a lot of readings over 200.   Please come back for a follow-up appointment in 6 months.

## 2020-10-27 NOTE — Patient Instructions (Addendum)
continue the same other 3 diabetes medications.   check your blood sugar twice a day.  vary the time of day when you check, between before the 3 meals, and at bedtime.  also check if you have symptoms of your blood sugar being too high or too low.  please keep a record of the readings and bring it to your next appointment here (or you can bring the meter itself).  You can write it on any piece of paper.  please call us sooner if your blood sugar goes below 70, or if you have a lot of readings over 200.   Please come back for a follow-up appointment in 6 months.

## 2020-10-29 ENCOUNTER — Other Ambulatory Visit: Payer: Self-pay | Admitting: Endocrinology

## 2020-10-29 ENCOUNTER — Other Ambulatory Visit: Payer: Self-pay | Admitting: Internal Medicine

## 2020-10-29 ENCOUNTER — Other Ambulatory Visit: Payer: Self-pay | Admitting: Gastroenterology

## 2020-10-29 NOTE — Telephone Encounter (Signed)
Please refill as per office routine med refill policy (all routine meds to be refilled for 3 mo or monthly (per pt preference) up to one year from last visit, then month to month grace period for 3 mo, then further med refills will have to be denied) ? ?

## 2020-11-20 ENCOUNTER — Other Ambulatory Visit: Payer: Self-pay | Admitting: Internal Medicine

## 2020-11-20 NOTE — Telephone Encounter (Signed)
Please to contact pt   This refill would normally come per opthalmology

## 2020-11-22 ENCOUNTER — Telehealth: Payer: Self-pay | Admitting: Family Medicine

## 2020-11-22 NOTE — Telephone Encounter (Signed)
Patient called asking if Dr Katrinka Blazing would be able to write him a letter stating that he is okay to return to work. 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   Patient states that he would be able to pick up the hard copy on Friday.

## 2020-11-23 NOTE — Telephone Encounter (Signed)
Letter written and placed a front desk.

## 2020-11-24 ENCOUNTER — Other Ambulatory Visit: Payer: Self-pay | Admitting: Internal Medicine

## 2020-11-25 ENCOUNTER — Ambulatory Visit (INDEPENDENT_AMBULATORY_CARE_PROVIDER_SITE_OTHER): Payer: 59 | Admitting: Internal Medicine

## 2020-11-25 ENCOUNTER — Encounter: Payer: Self-pay | Admitting: Internal Medicine

## 2020-11-25 ENCOUNTER — Other Ambulatory Visit: Payer: Self-pay

## 2020-11-25 VITALS — BP 128/70 | HR 70 | Ht 68.0 in | Wt 209.0 lb

## 2020-11-25 DIAGNOSIS — M67441 Ganglion, right hand: Secondary | ICD-10-CM | POA: Diagnosis not present

## 2020-11-25 DIAGNOSIS — E119 Type 2 diabetes mellitus without complications: Secondary | ICD-10-CM

## 2020-11-25 DIAGNOSIS — E538 Deficiency of other specified B group vitamins: Secondary | ICD-10-CM

## 2020-11-25 DIAGNOSIS — I10 Essential (primary) hypertension: Secondary | ICD-10-CM | POA: Diagnosis not present

## 2020-11-25 DIAGNOSIS — E559 Vitamin D deficiency, unspecified: Secondary | ICD-10-CM | POA: Diagnosis not present

## 2020-11-25 DIAGNOSIS — Z0001 Encounter for general adult medical examination with abnormal findings: Secondary | ICD-10-CM

## 2020-11-25 NOTE — Assessment & Plan Note (Signed)
Last vitamin D Lab Results  Component Value Date   VD25OH 31.28 03/03/2020   Now considered low normal - to double oral replacement

## 2020-11-25 NOTE — Progress Notes (Signed)
Patient ID: Jason Ortega, male   DOB: Apr 01, 1965, 55 y.o.   MRN: [id number]        Chief Complaint: follow up low vit d, right hand tender area, dm, htn       HPI:  Jason Ortega is a 55 y.o. male here with c/o tender lump to the post hand near the index finger that seems to be bigger and smaller sometimes, now smaller and mild to mod tender, worse to use the first finger, intermittent, better to not use the hand, nothing else makes better or worse.  Pt denies chest pain, increased sob or doe, wheezing, orthopnea, PND, increased LE swelling, palpitations, dizziness or syncope.   Pt denies polydipsia, polyuria, or new focal neuro s/s.  Not taking Vit D   Pt denies fever, wt loss, night sweats, loss of appetite, or other constitutional symptoms        Wt Readings from Last 3 Encounters:  11/25/20 209 lb (94.8 kg)  10/27/20 205 lb 12.8 oz (93.4 kg)  08/26/20 211 lb 3.2 oz (95.8 kg)   BP Readings from Last 3 Encounters:  11/25/20 128/70  10/27/20 140/70  08/26/20 130/84         Past Medical History:  Diagnosis Date   Allergy    Anemia    Arthritis    Cataract    bilateral cataracts removed   Depression    Diabetes mellitus    Frequent headaches    GERD (gastroesophageal reflux disease)    Glaucoma    Hypercholesteremia    Hypertension    Migraines    Sleep apnea    wears c-PAP   Past Surgical History:  Procedure Laterality Date   bilateral cataracts removed     CARDIAC CATHETERIZATION  12/2011   COLONOSCOPY     LEFT HEART CATHETERIZATION WITH CORONARY ANGIOGRAM N/A 11/12/2011   Procedure: LEFT HEART CATHETERIZATION WITH CORONARY ANGIOGRAM;  Surgeon: Laverda Page, MD;  Location: [facility] CATH LAB;  Service: Cardiovascular;  Laterality: N/A;   SHOULDER SURGERY      reports that he has never smoked. He has never used smokeless tobacco. He reports current alcohol use. He reports that he does not use drugs. family history includes Arthritis in his mother; Cancer in his father;  Diabetes in his father and maternal grandfather; Hypertension in his father; Stroke in his mother. Allergies  Allergen Reactions   Pollen Extract Other (See Comments)    Stuffy nose   Current Outpatient Medications on File Prior to Visit  Medication Sig Dispense Refill   aspirin EC 325 MG tablet Take 1 tablet (325 mg total) by mouth daily. 30 tablet 99   atorvastatin (LIPITOR) 40 MG tablet TAKE 1 TABLET BY MOUTH EVERY DAY 90 tablet 3   baclofen (LIORESAL) 10 MG tablet Take 1 tablet (10 mg total) by mouth 3 (three) times daily. Prn for IBS flare 30 each 0   Blood Glucose Monitoring Suppl (ONETOUCH VERIO) w/Device KIT 1 Device by Does not apply route 2 (two) times daily as needed. (Patient taking differently: 1 Device by Does not apply route 2 (two) times daily. E11.9) 1 kit 0   diclofenac sodium (VOLTAREN) 1 % GEL Apply 2 g topically 4 (four) times daily. 200 g 5   dicyclomine (BENTYL) 10 MG capsule TAKE 1 CAPSULE (10 MG TOTAL) BY MOUTH 4 (FOUR) TIMES DAILY - BEFORE MEALS AND AT BEDTIME. 90 capsule 3   FARXIGA 10 MG TABS tablet TAKE 1 TABLET BY MOUTH EVERY DAY  90 tablet 1   Galcanezumab-gnlm (EMGALITY) 120 MG/ML SOAJ Inject 120 mg into the skin every 30 (thirty) days. 1 mL 11   glucose blood (ONETOUCH VERIO) test strip 1 each by Other route 2 (two) times daily. E11.9     ibuprofen (ADVIL,MOTRIN) 600 MG tablet TAKE 1 TABLET(600 MG) BY MOUTH EVERY 8 HOURS AS NEEDED 30 tablet 0   losartan-hydrochlorothiazide (HYZAAR) 100-25 MG tablet Take 1 tablet by mouth daily. 90 tablet 3   meloxicam (MOBIC) 15 MG tablet Take 1 tablet (15 mg total) by mouth daily. 30 tablet 5   metFORMIN (GLUCOPHAGE-XR) 500 MG 24 hr tablet TAKE 4 TABLETS BY MOUTH DAILY WITH BREAKFAST 360 tablet 1   methocarbamol (ROBAXIN) 500 MG tablet TAKE 1 TABLET BY MOUTH TWICE DAILY (Patient taking differently: Take 500 mg by mouth every 6 (six) hours as needed.) 20 tablet 0   omeprazole (PRILOSEC) 40 MG capsule TAKE 1 CAPSULE IN THE [facility]  AND AT BEDTIME 180 capsule 1   OneTouch Delica Lancets 12Y MISC 1 each by Does not apply route 2 (two) times daily. E11.9     PENNSAID 2 % SOLN SMARTSIG:2 Pump Topical Twice Daily     propranolol (INDERAL) 20 MG tablet Take 1 tablet (20 mg total) by mouth 2 (two) times daily. 180 tablet 3   rizatriptan (MAXALT-MLT) 10 MG disintegrating tablet Take 1 tablet (10 mg total) by mouth as needed for migraine. May repeat in 2 hours if needed 9 tablet 11   Semaglutide, 2 MG/DOSE, (OZEMPIC, 2 MG/DOSE,) 8 MG/3ML SOPN Inject 2 mg into the skin once a week. 9 mL 3   sildenafil (REVATIO) 20 MG tablet Take 20 mg by mouth. 3-5 tabs prn     timolol (TIMOPTIC-XR) 0.5 % ophthalmic gel-forming Place 1 drop into both eyes daily.      verapamil (CALAN-SR) 180 MG CR tablet Take 1 tablet (180 mg total) by mouth daily. 90 tablet 3   No current facility-administered medications on file prior to visit.        ROS:  All others reviewed and negative.  Objective        PE:  BP 128/70 (BP Location: Right Arm, Patient Position: Sitting, Cuff Size: Large)   Pulse 70   Ht $R'5\' 8"'YI$  (1.727 m)   Wt 209 lb (94.8 kg)   SpO2 99%   BMI 31.78 kg/m                 Constitutional: Pt appears in NAD               HENT: Head: NCAT.                Right Ear: External ear normal.                 Left Ear: External ear normal.                Eyes: . Pupils are equal, round, and reactive to light. Conjunctivae and EOM are normal               Nose: without d/c or deformity               Neck: Neck supple. Gross normal ROM               Cardiovascular: Normal rate and regular rhythm.                 Pulmonary/Chest: Effort normal and breath sounds without  rales or wheezing.                Abd:  Soft, NT, ND, + BS, no organomegaly               Neurological: Pt is alert. At baseline orientation, motor grossly intact               Skin: LE edema - none; right hand with 2nd extensor tendon with tender subq knot without overllying skin  change                Psychiatric: Pt behavior is normal without agitation   Micro: none  Cardiac tracings I have personally interpreted today:  none  Pertinent Radiological findings (summarize): none   Lab Results  Component Value Date   WBC 4.6 03/03/2020   HGB 13.7 03/03/2020   HCT 42.0 03/03/2020   PLT 265.0 03/03/2020   GLUCOSE 94 03/03/2020   CHOL 124 03/03/2020   TRIG 67.0 03/03/2020   HDL 50.00 03/03/2020   LDLCALC 60 03/03/2020   ALT 21 03/03/2020   AST 20 03/03/2020   NA 139 03/03/2020   K 3.9 03/03/2020   CL 101 03/03/2020   CREATININE 1.13 03/03/2020   BUN 15 03/03/2020   CO2 32 03/03/2020   TSH 1.01 03/03/2020   PSA 0.72 03/03/2020   HGBA1C 6.2 (A) 10/27/2020   MICROALBUR <0.7 03/03/2020   Assessment/Plan:  Jason Ortega is a 55 y.o. Black or African American [2] male with  has a past medical history of Allergy, Anemia, Arthritis, Cataract, Depression, Diabetes mellitus, Frequent headaches, GERD (gastroesophageal reflux disease), Glaucoma, Hypercholesteremia, Hypertension, Migraines, and Sleep apnea.  Vitamin D deficiency Last vitamin D Lab Results  Component Value Date   VD25OH 31.28 03/03/2020   Now considered low normal - to double oral replacement   Type 2 diabetes mellitus without complication, without long-term current use of insulin (HCC) Lab Results  Component Value Date   HGBA1C 6.2 (A) 10/27/2020   Stable, pt to continue current medical treatment metformin, farxiga, semiglutide   Hypertension, uncontrolled BP Readings from Last 3 Encounters:  11/25/20 128/70  10/27/20 140/70  08/26/20 130/84   Stable, pt to continue medical treatment hyzzr, inderall, calan  Ganglion cyst of tendon sheath of right hand Mild, decliens hand surgury for now, for tylenol prn, and  to f/u any worsening symptoms or concerns  Followup: No follow-ups on file.  Cathlean Cower, MD 11/26/2020 8:53 PM [facility]

## 2020-11-25 NOTE — Patient Instructions (Addendum)
Please consider the Shingrix shingles shot if covered by the insurance  Please keep in mind the new COViD vaccine from Novavax  Please take OTC Vitamin D3 at 2000 units per day, indefinitely  You have a Ganglion Cyst on the right hand; please call for referral to Hand Surgury if it gets worse  Please continue all other medications as before, and refills have been done if requested.  Please have the pharmacy call with any other refills you may need.  Please keep your appointments with your specialists as you may have planned  Please make an Appointment to return in 3 months, or sooner if needed, also with Lab work done at the [facility] lab a few days ahead

## 2020-11-26 ENCOUNTER — Encounter: Payer: Self-pay | Admitting: Internal Medicine

## 2020-11-26 DIAGNOSIS — M67441 Ganglion, right hand: Secondary | ICD-10-CM | POA: Insufficient documentation

## 2020-11-26 NOTE — Assessment & Plan Note (Addendum)
BP Readings from Last 3 Encounters:  11/25/20 128/70  10/27/20 140/70  08/26/20 130/84   Stable, pt to continue medical treatment hyzzr, inderall, calan

## 2020-11-26 NOTE — Assessment & Plan Note (Signed)
Lab Results  Component Value Date   HGBA1C 6.2 (A) 10/27/2020   Stable, pt to continue current medical treatment metformin, farxiga, semiglutide

## 2020-11-26 NOTE — Assessment & Plan Note (Addendum)
Mild, decliens hand surgury for now, for tylenol prn, and  to f/u any worsening symptoms or concerns

## 2020-12-05 ENCOUNTER — Encounter: Payer: Self-pay | Admitting: Internal Medicine

## 2020-12-14 ENCOUNTER — Emergency Department (HOSPITAL_COMMUNITY)
Admission: EM | Admit: 2020-12-14 | Discharge: 2020-12-15 | Disposition: A | Discharge status: Home / Self Care | Attending: Emergency Medicine | Admitting: Emergency Medicine

## 2020-12-14 ENCOUNTER — Other Ambulatory Visit: Payer: Self-pay

## 2020-12-14 ENCOUNTER — Encounter: Payer: Self-pay | Admitting: Hematology and Oncology

## 2020-12-14 ENCOUNTER — Emergency Department (HOSPITAL_COMMUNITY)

## 2020-12-14 ENCOUNTER — Encounter (HOSPITAL_COMMUNITY): Payer: Self-pay | Admitting: Emergency Medicine

## 2020-12-14 DIAGNOSIS — Z7984 Long term (current) use of oral hypoglycemic drugs: Secondary | ICD-10-CM | POA: Insufficient documentation

## 2020-12-14 DIAGNOSIS — M25561 Pain in right knee: Secondary | ICD-10-CM

## 2020-12-14 DIAGNOSIS — Z794 Long term (current) use of insulin: Secondary | ICD-10-CM | POA: Diagnosis not present

## 2020-12-14 DIAGNOSIS — Z7982 Long term (current) use of aspirin: Secondary | ICD-10-CM | POA: Diagnosis not present

## 2020-12-14 DIAGNOSIS — S50811A Abrasion of right forearm, initial encounter: Secondary | ICD-10-CM | POA: Insufficient documentation

## 2020-12-14 DIAGNOSIS — Z23 Encounter for immunization: Secondary | ICD-10-CM | POA: Insufficient documentation

## 2020-12-14 DIAGNOSIS — S8991XA Unspecified injury of right lower leg, initial encounter: Secondary | ICD-10-CM | POA: Diagnosis present

## 2020-12-14 DIAGNOSIS — Y990 Civilian activity done for income or pay: Secondary | ICD-10-CM | POA: Insufficient documentation

## 2020-12-14 DIAGNOSIS — I10 Essential (primary) hypertension: Secondary | ICD-10-CM | POA: Diagnosis not present

## 2020-12-14 DIAGNOSIS — S80211A Abrasion, right knee, initial encounter: Secondary | ICD-10-CM | POA: Diagnosis not present

## 2020-12-14 DIAGNOSIS — W540XXA Bitten by dog, initial encounter: Secondary | ICD-10-CM | POA: Diagnosis not present

## 2020-12-14 DIAGNOSIS — E119 Type 2 diabetes mellitus without complications: Secondary | ICD-10-CM | POA: Diagnosis not present

## 2020-12-14 MED ORDER — AMOXICILLIN-POT CLAVULANATE 875-125 MG PO TABS
1.0000 | ORAL_TABLET | Freq: Once | ORAL | Status: AC
  Administered 2020-12-14: 1 via ORAL
  Filled 2020-12-14: qty 1

## 2020-12-14 MED ORDER — TETANUS-DIPHTH-ACELL PERTUSSIS 5-2.5-18.5 LF-MCG/0.5 IM SUSY
0.5000 mL | PREFILLED_SYRINGE | Freq: Once | INTRAMUSCULAR | Status: AC
  Administered 2020-12-14: 0.5 mL via INTRAMUSCULAR
  Filled 2020-12-14: qty 0.5

## 2020-12-14 NOTE — ED Provider Notes (Signed)
Jason Ortega Provider Note   CSN: [id number] Arrival date & time: 12/14/20  2116     History Chief Complaint  Patient presents with   Knee Injury   Animal Bite         Jason Ortega is a 55 y.o. male.  The history is provided by the patient and medical records.  Animal Bite  54 year old male with history of arthritis, depression, diabetes, GERD, hypertension, hyperlipidemia, presenting to the ED due to work-related incident.  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  He sustained small wound to the right forearm.  States dog jumped on him and caused him to lose his footing and fell onto both of his knees on pavement.  There was no head injury or loss of consciousness.  He has chronic issues with his right knee and this has exacerbated it.  He has been ambulatory since then but with increased pain.  Date of last tetanus unknown.  Dog owner reports dog was up-to-date on vaccines, however vet's office said they were not.  Animal control has been contacted and will quarantine dog for 10 days.  Past Medical History:  Diagnosis Date   Allergy    Anemia    Arthritis    Cataract    bilateral cataracts removed   Depression    Diabetes mellitus    Frequent headaches    GERD (gastroesophageal reflux disease)    Glaucoma    Hypercholesteremia    Hypertension    Migraines    Sleep apnea    wears c-PAP    Patient Active Problem List   Diagnosis Date Noted   Ganglion cyst of tendon sheath of right hand 11/26/2020   TIA (transient ischemic attack) 08/27/2020   Onychomycosis 05/27/2020   Tinea pedis 05/27/2020   Tear of right meniscus as current injury 04/26/2020   Chest pain 03/03/2020   Dysphagia 03/03/2020   Right hamstring muscle strain 11/03/2019   Contraception management 09/01/2019   Radial styloid tenosynovitis (de quervain) 07/15/2019   Epididymitis 05/26/2019    Vitamin D deficiency 03/04/2019   Pre-ulcerative corn or callous 10/10/2018   Right knee pain 08/22/2018   Chondromalacia of right knee 02/10/2018   Left ankle pain 02/10/2018   Iron deficiency anemia 10/01/2017   Encounter for well adult exam with abnormal findings 08/06/2017   Anxiety and depression 07/16/2017   Tingling of both feet 04/08/2017   Lightheadedness 03/26/2017   Chronic bilateral low back pain with bilateral sciatica 12/07/2016   Bilateral carpal tunnel syndrome 12/04/2016   Type 2 diabetes mellitus without complication, without long-term current use of insulin ([place]) 06/06/2016   Hypertension, uncontrolled 06/06/2016   Gastroesophageal reflux disease without esophagitis 06/06/2016   Intractable migraine without aura and without status migrainosus 10/28/2013   H/O cardiac catheterization 10/28/2013   Hyperlipidemia LDL goal <100 10/28/2013   Obstructive sleep apnea 10/28/2013   Personal history of other diseases of the nervous system and sense organs 10/28/2013   Other specified postprocedural states 09/03/2013   S/P arthroscopy of shoulder 09/03/2013   Impingement syndrome, shoulder 06/04/2013   Allergic rhinitis 06/25/2011   Excessive sleepiness 06/25/2011   Fatigue 06/25/2011    Past Surgical History:  Procedure Laterality Date   bilateral cataracts removed     CARDIAC CATHETERIZATION  12/2011   COLONOSCOPY     LEFT HEART CATHETERIZATION WITH CORONARY ANGIOGRAM N/A 11/12/2011   Procedure: LEFT  HEART CATHETERIZATION WITH CORONARY ANGIOGRAM;  Surgeon: Pamella Pert, MD;  Location: [facility] CATH LAB;  Service: Cardiovascular;  Laterality: N/A;   SHOULDER SURGERY         Family History  Problem Relation Age of Onset   Arthritis Mother    Stroke Mother    Hypertension Father    Diabetes Father    Cancer Father        Multiple Myleoma   Diabetes Maternal Grandfather    Colon cancer Neg Hx    Esophageal cancer Neg Hx    Rectal cancer Neg Hx    Stomach cancer  Neg Hx     Social History   Tobacco Use   Smoking status: Never   Smokeless tobacco: Never  Vaping Use   Vaping Use: Never used  Substance Use Topics   Alcohol use: Yes    Comment: occasional   Drug use: No    Home Medications Prior to Admission medications   Medication Sig Start Date End Date Taking? Authorizing Provider  amoxicillin-clavulanate (AUGMENTIN) 875-125 MG tablet Take 1 tablet by mouth every 12 (twelve) hours. 12/15/20  Yes Garlon Hatchet, PA-C  aspirin EC 325 MG tablet Take 1 tablet (325 mg total) by mouth daily. 08/26/20   Corwin Levins, MD  atorvastatin (LIPITOR) 40 MG tablet TAKE 1 TABLET BY MOUTH EVERY DAY 10/31/20   Corwin Levins, MD  baclofen (LIORESAL) 10 MG tablet Take 1 tablet (10 mg total) by mouth 3 (three) times daily. Prn for IBS flare 12/24/17   Olive Bass, FNP  Blood Glucose Monitoring Suppl (ONETOUCH VERIO) w/Device KIT 1 Device by Does not apply route 2 (two) times daily as needed. Patient taking differently: 1 Device by Does not apply route 2 (two) times daily. E11.9 07/31/16   Plotnikov, Georgina Quint, MD  diclofenac sodium (VOLTAREN) 1 % GEL Apply 2 g topically 4 (four) times daily. 10/10/18   Corwin Levins, MD  dicyclomine (BENTYL) 10 MG capsule TAKE 1 CAPSULE (10 MG TOTAL) BY MOUTH 4 (FOUR) TIMES DAILY - BEFORE MEALS AND AT BEDTIME. 05/26/18   Corwin Levins, MD  FARXIGA 10 MG TABS tablet TAKE 1 TABLET BY MOUTH EVERY DAY 10/31/20   Romero Belling, MD  Galcanezumab-gnlm [facility]) 120 MG/ML SOAJ Inject 120 mg into the skin every 30 (thirty) days. 06/02/20   Lomax, Amy, NP  glucose blood (ONETOUCH VERIO) test strip 1 each by Other route 2 (two) times daily. E11.9    [provider]  ibuprofen (ADVIL,MOTRIN) 600 MG tablet TAKE 1 TABLET(600 MG) BY MOUTH EVERY 8 HOURS AS NEEDED 05/14/18   Plotnikov, Georgina Quint, MD  losartan-hydrochlorothiazide (HYZAAR) 100-25 MG tablet Take 1 tablet by mouth daily. 05/27/20   Corwin Levins, MD  meloxicam (MOBIC) 15  MG tablet Take 1 tablet (15 mg total) by mouth daily. 12/31/18   Corwin Levins, MD  metFORMIN (GLUCOPHAGE-XR) 500 MG 24 hr tablet TAKE 4 TABLETS BY MOUTH DAILY WITH BREAKFAST 07/09/20   Romero Belling, MD  methocarbamol (ROBAXIN) 500 MG tablet TAKE 1 TABLET BY MOUTH TWICE DAILY Patient taking differently: Take 500 mg by mouth every 6 (six) hours as needed. 10/30/17   Evaristo Bury, NP  omeprazole (PRILOSEC) 40 MG capsule TAKE 1 CAPSULE IN THE [facility] AND AT BEDTIME 10/31/20   Meryl Dare, MD  OneTouch Delica Lancets 30G MISC 1 each by Does not apply route 2 (two) times daily. E11.9    [provider]  PENNSAID 2 % SOLN SMARTSIG:2 Pump Topical Twice Daily 02/23/19   [provider]  propranolol (INDERAL) 20 MG tablet Take 1 tablet (20 mg total) by mouth 2 (two) times daily. 04/20/20   Lomax, Amy, NP  rizatriptan (MAXALT-MLT) 10 MG disintegrating tablet Take 1 tablet (10 mg total) by mouth as needed for migraine. May repeat in 2 hours if needed 04/20/20   Lomax, Amy, NP  Semaglutide, 2 MG/DOSE, (OZEMPIC, 2 MG/DOSE,) 8 MG/3ML SOPN Inject 2 mg into the skin once a week. 06/23/20   Renato Shin, MD  sildenafil (REVATIO) 20 MG tablet Take 20 mg by mouth. 3-5 tabs prn 12/17/14   [provider]  timolol (TIMOPTIC-XR) 0.5 % ophthalmic gel-forming Place 1 drop into both eyes daily.  08/10/16   [provider]  verapamil (CALAN-SR) 180 MG CR tablet Take 1 tablet (180 mg total) by mouth daily. 05/27/20   Biagio Borg, MD    Allergies    Pollen extract  Review of Systems   Review of Systems  Skin:  Positive for wound.  All other systems reviewed and are negative.  Physical Exam Updated Vital Signs Pulse 88   Temp 98.2 F (36.8 C) (Oral)   Resp 18   Ht $R'5\' 8"'bC$  (1.727 m)   Wt 93 kg   SpO2 96%   BMI 31.17 kg/m   Physical Exam Vitals and nursing note reviewed.  Constitutional:      Appearance: He is well-developed.  HENT:     Head: Normocephalic and  atraumatic.  Eyes:     Conjunctiva/sclera: Conjunctivae normal.     Pupils: Pupils are equal, round, and reactive to light.  Cardiovascular:     Rate and Rhythm: Normal rate and regular rhythm.     Heart sounds: Normal heart sounds.  Pulmonary:     Effort: Pulmonary effort is normal.     Breath sounds: Normal breath sounds.  Abdominal:     General: Bowel sounds are normal.     Palpations: Abdomen is soft.  Musculoskeletal:        General: Normal range of motion.     Cervical back: Normal range of motion.     Comments: Very superficial wound noted to right volar forearm, there is no gaping tissue or active bleeding, no swelling/bruising noted Abrasion noted to right knee just below patella, there is tenderness along medial joint line without swelling or deformity, DP pulse intact, normal ROM  Skin:    General: Skin is warm and dry.  Neurological:     Mental Status: He is alert and oriented to person, place, and time.    ED Results / Procedures / Treatments   Labs (all labs ordered are listed, but only abnormal results are displayed) Labs Reviewed - No data to display  EKG None  Radiology DG Knee Complete 4 Views Right  Result Date: 12/14/2020 CLINICAL DATA:  Bit by dog, fall EXAM: RIGHT KNEE - COMPLETE 4+ VIEW COMPARISON:  12/23/2019 FINDINGS: No fracture or malalignment. Mild medial and patellofemoral degenerative changes. No sizable knee effusion. IMPRESSION: Mild degenerative changes.  No acute osseous abnormality Electronically Signed   By: Donavan Foil M.D.   On: 12/14/2020 23:46    Procedures Procedures   Medications Ordered in ED Medications  Tdap (BOOSTRIX) injection 0.5 mL (0.5 mLs Intramuscular Given 12/14/20 2338)  amoxicillin-clavulanate (AUGMENTIN) 875-125 MG per tablet 1 tablet (1 tablet Oral Given 12/14/20 2338)    ED Course  I have reviewed the triage vital  signs and the nursing notes.  Pertinent labs & imaging results that were available during my care  of the patient were reviewed by me and considered in my medical decision making (see chart for details).    MDM Rules/Calculators/A&P                           55 year old male here with work-related injury.  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  There was no head injury or loss of consciousness.  He has abrasion to right volar forearm without any gaping tissue or active bleeding.  Also has abrasion to right knee without swelling or deformity.  Has chronic issues with this knee but worse now.  X-ray is negative for any acute bony findings.  His tetanus was updated and he was given first dose of Augmentin.  Dog is currently in animal control custody and undergoing surveillance for rabies.  He is comfortable holding off on vaccines at this time.  He will follow-up with his primary care doctor.  Can return here for new concerns.  Final Clinical Impression(s) / ED Diagnoses Final diagnoses:  Dog bite, initial encounter  Acute pain of right knee    Rx / DC Orders ED Discharge Orders          Ordered    amoxicillin-clavulanate (AUGMENTIN) 875-125 MG tablet  Every 12 hours        12/15/20 0026             Larene Pickett, PA-C 12/15/20 0029    Fatima Blank, MD 12/21/20 [phone]

## 2020-12-14 NOTE — ED Triage Notes (Signed)
Today, around 5 PM, pt was delivering mail to a resident. He was bitten by a dog on the right forearm (4 cm x 0.2 cm tear of epidermis) causing him to fall on both knees. Right knee pain 8/10 and left knee pain 6/10.

## 2020-12-15 ENCOUNTER — Ambulatory Visit: Payer: Self-pay

## 2020-12-15 ENCOUNTER — Encounter: Payer: Self-pay | Admitting: Family Medicine

## 2020-12-15 ENCOUNTER — Ambulatory Visit (INDEPENDENT_AMBULATORY_CARE_PROVIDER_SITE_OTHER): Payer: 59 | Admitting: Family Medicine

## 2020-12-15 VITALS — BP 132/82 | HR 82 | Ht 68.0 in | Wt 205.0 lb

## 2020-12-15 DIAGNOSIS — M25561 Pain in right knee: Secondary | ICD-10-CM | POA: Diagnosis not present

## 2020-12-15 DIAGNOSIS — M94261 Chondromalacia, right knee: Secondary | ICD-10-CM | POA: Diagnosis not present

## 2020-12-15 DIAGNOSIS — G8929 Other chronic pain: Secondary | ICD-10-CM | POA: Diagnosis not present

## 2020-12-15 MED ORDER — AMOXICILLIN-POT CLAVULANATE 875-125 MG PO TABS: 1.0000 | ORAL_TABLET | Freq: Two times a day (BID) | ORAL | 0 refills | Status: DC

## 2020-12-15 MED ORDER — MELOXICAM 15 MG PO TABS: 15.0000 mg | ORAL_TABLET | Freq: Every day | ORAL | 5 refills | Status: DC

## 2020-12-15 NOTE — Progress Notes (Signed)
[facility] [address] [place] [address] Phone: [phone] Subjective:   Jason Ortega, am serving as a scribe for Dr. Antoine Primas. This visit occurred during the SARS-CoV-2 public health emergency.  Safety protocols were in place, including screening questions prior to the visit, additional usage of staff PPE, and extensive cleaning of exam room while observing appropriate contact time as indicated for disinfecting solutions.   I'm seeing this patient by the request  of:  Corwin Levins, MD  CC: right knee pain   [id number]  04/26/2020 Patient's MRI showed that patient does have fairly significant meniscal tear noted with some derangement.  This is likely contributing to more of his discomfort and pain.  Moderate arthritic changes but I do think that patient could potentially respond fairly well to a meniscectomy.  Patient has been doing relatively well with this knee but unfortunately over the course of the last 2 years would make some improvement and then had a setback.  Now having locking aspect of it and due to failing all other conservative therapy has advanced imaging was warranted.  I do feel that patient would do really well with the potential surgical intervention and we would refer him.  Patient is checking Workmen's Comp. to see if this will be potentially covered secondary to this is likely bleeding from his initial concern in 2020.  It is somewhat difficult to say for sure when the tear potentially occurred the patient does need it fixed so we can do his regular duties.  Update 12/15/2020 Jason Ortega is a 55 y.o. male coming in with complaint of R knee pain. Patient states that he feel on knee last night when he was fighting with a dog. Pain over medial aspect and states that it feels weak. Antalgic gait.  Patient states that has not had difficulty with walking at the moment.  Very uncomfortable at night as well.  Xray R knee  12/14/2020 IMPRESSION: Mild degenerative changes.  No acute osseous abnormality This was independently visualized by me.    Past Medical History:  Diagnosis Date   Allergy    Anemia    Arthritis    Cataract    bilateral cataracts removed   Depression    Diabetes mellitus    Frequent headaches    GERD (gastroesophageal reflux disease)    Glaucoma    Hypercholesteremia    Hypertension    Migraines    Sleep apnea    wears c-PAP   Past Surgical History:  Procedure Laterality Date   bilateral cataracts removed     CARDIAC CATHETERIZATION  12/2011   COLONOSCOPY     LEFT HEART CATHETERIZATION WITH CORONARY ANGIOGRAM N/A 11/12/2011   Procedure: LEFT HEART CATHETERIZATION WITH CORONARY ANGIOGRAM;  Surgeon: Pamella Pert, MD;  Location: [facility] CATH LAB;  Service: Cardiovascular;  Laterality: N/A;   SHOULDER SURGERY     Social History   Socioeconomic History   Marital status: Single    Spouse name: Not on file   Number of children: 2   Years of education: 59   Highest education level: Not on file  Occupational History   Occupation: Stocker  Tobacco Use   Smoking status: Never   Smokeless tobacco: Never  Vaping Use   Vaping Use: Never used  Substance and Sexual Activity   Alcohol use: Yes    Comment: occasional   Drug use: No   Sexual activity: Yes    Birth control/protection: None  Other Topics Concern   Not on file  Social History Narrative   Fun/Hobby: Leisure centre manager football   Social Determinants of Health   Financial Resource Strain: Not on file  Food Insecurity: Not on file  Transportation Needs: Not on file  Physical Activity: Not on file  Stress: Not on file  Social Connections: Not on file   Allergies  Allergen Reactions   Pollen Extract Other (See Comments)    Stuffy nose   Family History  Problem Relation Age of Onset   Arthritis Mother    Stroke Mother    Hypertension Father    Diabetes Father    Cancer Father        Multiple Myleoma   Diabetes  Maternal Grandfather    Colon cancer Neg Hx    Esophageal cancer Neg Hx    Rectal cancer Neg Hx    Stomach cancer Neg Hx     Current Outpatient Medications (Endocrine & Metabolic):    FARXIGA 10 MG TABS tablet, TAKE 1 TABLET BY MOUTH EVERY DAY   metFORMIN (GLUCOPHAGE-XR) 500 MG 24 hr tablet, TAKE 4 TABLETS BY MOUTH DAILY WITH BREAKFAST   Semaglutide, 2 MG/DOSE, (OZEMPIC, 2 MG/DOSE,) 8 MG/3ML SOPN, Inject 2 mg into the skin once a week.  Current Outpatient Medications (Cardiovascular):    atorvastatin (LIPITOR) 40 MG tablet, TAKE 1 TABLET BY MOUTH EVERY DAY   losartan-hydrochlorothiazide (HYZAAR) 100-25 MG tablet, Take 1 tablet by mouth daily.   propranolol (INDERAL) 20 MG tablet, Take 1 tablet (20 mg total) by mouth 2 (two) times daily.   sildenafil (REVATIO) 20 MG tablet, Take 20 mg by mouth. 3-5 tabs prn   verapamil (CALAN-SR) 180 MG CR tablet, Take 1 tablet (180 mg total) by mouth daily.   Current Outpatient Medications (Analgesics):    aspirin EC 325 MG tablet, Take 1 tablet (325 mg total) by mouth daily.   Galcanezumab-gnlm (EMGALITY) 120 MG/ML SOAJ, Inject 120 mg into the skin every 30 (thirty) days.   ibuprofen (ADVIL,MOTRIN) 600 MG tablet, TAKE 1 TABLET(600 MG) BY MOUTH EVERY 8 HOURS AS NEEDED   meloxicam (MOBIC) 15 MG tablet, Take 1 tablet (15 mg total) by mouth daily.   rizatriptan (MAXALT-MLT) 10 MG disintegrating tablet, Take 1 tablet (10 mg total) by mouth as needed for migraine. May repeat in 2 hours if needed   Current Outpatient Medications (Other):    amoxicillin-clavulanate (AUGMENTIN) 875-125 MG tablet, Take 1 tablet by mouth every 12 (twelve) hours.   baclofen (LIORESAL) 10 MG tablet, Take 1 tablet (10 mg total) by mouth 3 (three) times daily. Prn for IBS flare   Blood Glucose Monitoring Suppl (ONETOUCH VERIO) w/Device KIT, 1 Device by Does not apply route 2 (two) times daily as needed. (Patient taking differently: 1 Device by Does not apply route 2 (two) times  daily. E11.9)   diclofenac sodium (VOLTAREN) 1 % GEL, Apply 2 g topically 4 (four) times daily.   dicyclomine (BENTYL) 10 MG capsule, TAKE 1 CAPSULE (10 MG TOTAL) BY MOUTH 4 (FOUR) TIMES DAILY - BEFORE MEALS AND AT BEDTIME.   glucose blood (ONETOUCH VERIO) test strip, 1 each by Other route 2 (two) times daily. E11.9   methocarbamol (ROBAXIN) 500 MG tablet, TAKE 1 TABLET BY MOUTH TWICE DAILY (Patient taking differently: Take 500 mg by mouth every 6 (six) hours as needed.)   omeprazole (PRILOSEC) 40 MG capsule, TAKE 1 CAPSULE IN THE [facility] AND AT BEDTIME   OneTouch Delica Lancets 72I MISC, 1 each by  Does not apply route 2 (two) times daily. E11.9   PENNSAID 2 % SOLN, SMARTSIG:2 Pump Topical Twice Daily   timolol (TIMOPTIC-XR) 0.5 % ophthalmic gel-forming, Place 1 drop into both eyes daily.    Reviewed prior external information including notes and imaging from  primary care provider As well as notes that were available from care everywhere and other healthcare systems.  Past medical history, social, surgical and family history all reviewed in electronic medical record.  No pertanent information unless stated regarding to the chief complaint.   Review of Systems:  No headache, visual changes, nausea, vomiting, diarrhea, constipation, dizziness, abdominal pain, skin rash, fevers, chills, night sweats, weight loss, swollen lymph nodes, body aches, joint swelling, chest pain, shortness of breath, mood changes. POSITIVE muscle aches  Objective  Blood pressure 132/82, pulse 82, height $RemoveBe'5\' 8"'MTvbwBIaa$  (1.727 m), weight 205 lb (93 kg), SpO2 98 %.   General: No apparent distress alert and oriented x3 mood and affect normal, dressed appropriately.  HEENT: Pupils equal, extraocular movements intact  Respiratory: Patient's speak in full sentences and does not appear short of breath  Cardiovascular: No lower extremity edema, non tender, no erythema  MSK: Patient's right knee does have a trace effusion noted  compared to the contralateral side.  Patient does have some pain over the patella and mostly on the medial aspect.  Patient also has a what appears to be a contusion over the medial condyle region.  Patient lacks the last 5 degrees of flexion.  Limited muscular skeletal ultrasound was performed and interpreted by Hulan Saas, M  Limited musculoskeletal ultrasound did show that patient did have a trace effusion noted of the patellofemoral joint. Acute on chronic medial meniscal tear with no significant displacement noted likely.  Patient did have hypoechoic changes over the medial condyle that I think is also potentially contributing. Impression: Knee effusion, likely meniscal injury, and bone contusion    Impression and Recommendations:     The above documentation has been reviewed and is accurate and complete Lyndal Pulley, DO

## 2020-12-15 NOTE — Patient Instructions (Signed)
Meloxicam daily for 10 days then as needed. Do not take ibuprofen or Advil with it Ice 20 minutes 2 times daily. Usually after activity and before bed. Arnica lotion to the knee 2 times daily  Would recommend seated work for 2 weeks then trial of full duty and if you get better sooner we can advance.  See me again in 3 weeks (OK to double book)

## 2020-12-15 NOTE — Assessment & Plan Note (Signed)
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  Patient did fall and does have what appears to be a contusion secondary to a work-related injury.  Also believe the patient could have had a potential mild subluxation and given a knee brace for stability of the patella.  Discussed medications including meloxicam secondary to the effusion we noted as well as the icing regimen.  Seated work for a short course over the next 2 weeks and hopefully patient will be able to respond and get back to regular duty very quickly.

## 2020-12-15 NOTE — Discharge Instructions (Signed)
Keep wound clean with soap and warm water. Make sure to take ALL the antibiotics. Can follow-up with your primary care doctor. Return here for new concerns.

## 2020-12-15 NOTE — Patient Instructions (Signed)
Meloxicam called into pharmacy See me again in 3-4 weeks

## 2020-12-16 ENCOUNTER — Telehealth: Payer: Self-pay

## 2020-12-16 NOTE — Telephone Encounter (Signed)
Patient called stating that he needed a note to go back to work Monday the 14th as well as the note that he was already given. Writing note for patient and he will pick up later today.

## 2020-12-19 ENCOUNTER — Encounter: Payer: Self-pay | Admitting: Internal Medicine

## 2021-01-01 ENCOUNTER — Emergency Department (HOSPITAL_COMMUNITY)
Admission: EM | Admit: 2021-01-01 | Discharge: 2021-01-01 | Disposition: A | Discharge status: Home / Self Care | Payer: 59 | Attending: Emergency Medicine | Admitting: Emergency Medicine

## 2021-01-01 ENCOUNTER — Encounter (HOSPITAL_COMMUNITY): Payer: Self-pay | Admitting: Emergency Medicine

## 2021-01-01 DIAGNOSIS — I10 Essential (primary) hypertension: Secondary | ICD-10-CM | POA: Insufficient documentation

## 2021-01-01 DIAGNOSIS — Z7984 Long term (current) use of oral hypoglycemic drugs: Secondary | ICD-10-CM | POA: Insufficient documentation

## 2021-01-01 DIAGNOSIS — Z794 Long term (current) use of insulin: Secondary | ICD-10-CM | POA: Insufficient documentation

## 2021-01-01 DIAGNOSIS — E119 Type 2 diabetes mellitus without complications: Secondary | ICD-10-CM | POA: Diagnosis not present

## 2021-01-01 DIAGNOSIS — S51851D Open bite of right forearm, subsequent encounter: Secondary | ICD-10-CM | POA: Diagnosis not present

## 2021-01-01 DIAGNOSIS — S59911D Unspecified injury of right forearm, subsequent encounter: Secondary | ICD-10-CM | POA: Diagnosis present

## 2021-01-01 DIAGNOSIS — Z79899 Other long term (current) drug therapy: Secondary | ICD-10-CM | POA: Diagnosis not present

## 2021-01-01 DIAGNOSIS — W540XXD Bitten by dog, subsequent encounter: Secondary | ICD-10-CM | POA: Diagnosis not present

## 2021-01-01 DIAGNOSIS — Z7982 Long term (current) use of aspirin: Secondary | ICD-10-CM | POA: Diagnosis not present

## 2021-01-01 NOTE — ED Triage Notes (Signed)
PT c/o dog bite to R arm on 11/9. States he was supposed to hear from animal control regarding shot record and has not heard back. He did not get rabies shots at that time.

## 2021-01-01 NOTE — Discharge Instructions (Addendum)
Please follow up with the animal control and your primary care office. The [employer] office opens on Tuesday.  Information about rabies is attached to these discharge papers.

## 2021-01-01 NOTE — ED Provider Notes (Signed)
[facility] DEPT Provider Note   CSN: [id number] Arrival date & time: 01/01/21  2703     History Chief Complaint  Patient presents with   Animal Bite    Jason Ortega is a 55 y.o. male with a past medical history of migraines presenting today with a concern for rabies.  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  Was evaluated at that time and treated with a tetanus shot and Augmentin.  Animal control was contacted however he has not heard back from them.  The owner of the dog reports that the "dog's vaccines had lapsed."     Past Medical History:  Diagnosis Date   Allergy    Anemia    Arthritis    Cataract    bilateral cataracts removed   Depression    Diabetes mellitus    Frequent headaches    GERD (gastroesophageal reflux disease)    Glaucoma    Hypercholesteremia    Hypertension    Migraines    Sleep apnea    wears c-PAP    Patient Active Problem List   Diagnosis Date Noted   Ganglion cyst of tendon sheath of right hand 11/26/2020   TIA (transient ischemic attack) 08/27/2020   Onychomycosis 05/27/2020   Tinea pedis 05/27/2020   Tear of right meniscus as current injury 04/26/2020   Chest pain 03/03/2020   Dysphagia 03/03/2020   Right hamstring muscle strain 11/03/2019   Contraception management 09/01/2019   Radial styloid tenosynovitis (de quervain) 07/15/2019   Epididymitis 05/26/2019   Vitamin D deficiency 03/04/2019   Pre-ulcerative corn or callous 10/10/2018   Right knee pain 08/22/2018   Chondromalacia of right knee 02/10/2018   Left ankle pain 02/10/2018   Iron deficiency anemia 10/01/2017   Encounter for well adult exam with abnormal findings 08/06/2017   Anxiety and depression 07/16/2017   Tingling of both feet 04/08/2017   Lightheadedness 03/26/2017   Chronic bilateral low back pain with bilateral sciatica 12/07/2016   Bilateral carpal tunnel syndrome 12/04/2016   Type 2  diabetes mellitus without complication, without long-term current use of insulin ([place]) 06/06/2016   Hypertension, uncontrolled 06/06/2016   Gastroesophageal reflux disease without esophagitis 06/06/2016   Intractable migraine without aura and without status migrainosus 10/28/2013   H/O cardiac catheterization 10/28/2013   Hyperlipidemia LDL goal <100 10/28/2013   Obstructive sleep apnea 10/28/2013   Personal history of other diseases of the nervous system and sense organs 10/28/2013   Other specified postprocedural states 09/03/2013   S/P arthroscopy of shoulder 09/03/2013   Impingement syndrome, shoulder 06/04/2013   Allergic rhinitis 06/25/2011   Excessive sleepiness 06/25/2011   Fatigue 06/25/2011    Past Surgical History:  Procedure Laterality Date   bilateral cataracts removed     CARDIAC CATHETERIZATION  12/2011   COLONOSCOPY     LEFT HEART CATHETERIZATION WITH CORONARY ANGIOGRAM N/A 11/12/2011   Procedure: LEFT HEART CATHETERIZATION WITH CORONARY ANGIOGRAM;  Surgeon: Laverda Page, MD;  Location: [facility] CATH LAB;  Service: Cardiovascular;  Laterality: N/A;   SHOULDER SURGERY         Family History  Problem Relation Age of Onset   Arthritis Mother    Stroke Mother    Hypertension Father    Diabetes Father    Cancer Father        Multiple Myleoma   Diabetes Maternal Grandfather    Colon cancer Neg Hx    Esophageal cancer Neg  Hx    Rectal cancer Neg Hx    Stomach cancer Neg Hx     Social History   Tobacco Use   Smoking status: Never   Smokeless tobacco: Never  Vaping Use   Vaping Use: Never used  Substance Use Topics   Alcohol use: Yes    Comment: occasional   Drug use: No    Home Medications Prior to Admission medications   Medication Sig Start Date End Date Taking? Authorizing Provider  amoxicillin-clavulanate (AUGMENTIN) 875-125 MG tablet Take 1 tablet by mouth every 12 (twelve) hours. 12/15/20   Garlon Hatchet, PA-C  aspirin EC 325 MG tablet Take 1  tablet (325 mg total) by mouth daily. 08/26/20   Corwin Levins, MD  atorvastatin (LIPITOR) 40 MG tablet TAKE 1 TABLET BY MOUTH EVERY DAY 10/31/20   Corwin Levins, MD  baclofen (LIORESAL) 10 MG tablet Take 1 tablet (10 mg total) by mouth 3 (three) times daily. Prn for IBS flare 12/24/17   Olive Bass, FNP  Blood Glucose Monitoring Suppl (ONETOUCH VERIO) w/Device KIT 1 Device by Does not apply route 2 (two) times daily as needed. Patient taking differently: 1 Device by Does not apply route 2 (two) times daily. E11.9 07/31/16   Plotnikov, Georgina Quint, MD  diclofenac sodium (VOLTAREN) 1 % GEL Apply 2 g topically 4 (four) times daily. 10/10/18   Corwin Levins, MD  dicyclomine (BENTYL) 10 MG capsule TAKE 1 CAPSULE (10 MG TOTAL) BY MOUTH 4 (FOUR) TIMES DAILY - BEFORE MEALS AND AT BEDTIME. 05/26/18   Corwin Levins, MD  FARXIGA 10 MG TABS tablet TAKE 1 TABLET BY MOUTH EVERY DAY 10/31/20   Romero Belling, MD  Galcanezumab-gnlm [facility]) 120 MG/ML SOAJ Inject 120 mg into the skin every 30 (thirty) days. 06/02/20   Lomax, Amy, NP  glucose blood (ONETOUCH VERIO) test strip 1 each by Other route 2 (two) times daily. E11.9    [provider]  ibuprofen (ADVIL,MOTRIN) 600 MG tablet TAKE 1 TABLET(600 MG) BY MOUTH EVERY 8 HOURS AS NEEDED 05/14/18   Plotnikov, Georgina Quint, MD  losartan-hydrochlorothiazide (HYZAAR) 100-25 MG tablet Take 1 tablet by mouth daily. 05/27/20   Corwin Levins, MD  meloxicam (MOBIC) 15 MG tablet Take 1 tablet (15 mg total) by mouth daily. 12/15/20   Judi Saa, DO  metFORMIN (GLUCOPHAGE-XR) 500 MG 24 hr tablet TAKE 4 TABLETS BY MOUTH DAILY WITH BREAKFAST 07/09/20   Romero Belling, MD  methocarbamol (ROBAXIN) 500 MG tablet TAKE 1 TABLET BY MOUTH TWICE DAILY Patient taking differently: Take 500 mg by mouth every 6 (six) hours as needed. 10/30/17   Evaristo Bury, NP  omeprazole (PRILOSEC) 40 MG capsule TAKE 1 CAPSULE IN THE [facility] AND AT BEDTIME 10/31/20   Meryl Dare, MD   OneTouch Delica Lancets 30G MISC 1 each by Does not apply route 2 (two) times daily. E11.9    [provider]  PENNSAID 2 % SOLN SMARTSIG:2 Pump Topical Twice Daily 02/23/19   [provider]  propranolol (INDERAL) 20 MG tablet Take 1 tablet (20 mg total) by mouth 2 (two) times daily. 04/20/20   Lomax, Amy, NP  rizatriptan (MAXALT-MLT) 10 MG disintegrating tablet Take 1 tablet (10 mg total) by mouth as needed for migraine. May repeat in 2 hours if needed 04/20/20   Lomax, Amy, NP  Semaglutide, 2 MG/DOSE, (OZEMPIC, 2 MG/DOSE,) 8 MG/3ML SOPN Inject 2 mg into the skin once a week. 06/23/20  Renato Shin, MD  sildenafil (REVATIO) 20 MG tablet Take 20 mg by mouth. 3-5 tabs prn 12/17/14   [provider]  timolol (TIMOPTIC-XR) 0.5 % ophthalmic gel-forming Place 1 drop into both eyes daily.  08/10/16   [provider]  verapamil (CALAN-SR) 180 MG CR tablet Take 1 tablet (180 mg total) by mouth daily. 05/27/20   Biagio Borg, MD    Allergies    Pollen extract  Review of Systems   Review of Systems  Constitutional:  Negative for chills, fatigue and fever.  Gastrointestinal:  Negative for nausea and vomiting.  Neurological:  Positive for headaches. Negative for weakness.   Physical Exam Updated Vital Signs BP (!) 174/117   Pulse 71   Temp 98.8 F (37.1 C)   Resp 16   SpO2 98%   Physical Exam Vitals and nursing note reviewed.  Constitutional:      Appearance: Normal appearance.  HENT:     Head: Normocephalic and atraumatic.  Eyes:     General: No scleral icterus.    Conjunctiva/sclera: Conjunctivae normal.  Pulmonary:     Effort: Pulmonary effort is normal. No respiratory distress.  Skin:    General: Skin is warm.     Findings: Lesion present. No rash.     Comments: Well-appearing bite mark on the right forearm.  Neurological:     Mental Status: He is alert.  Psychiatric:        Mood and Affect: Mood normal.    ED Results / Procedures /  Treatments   Labs (all labs ordered are listed, but only abnormal results are displayed) Labs Reviewed - No data to display  EKG None  Radiology No results found.  Procedures Procedures   Medications Ordered in ED Medications - No data to display  ED Course  I have reviewed the triage vital signs and the nursing notes.  Pertinent labs & imaging results that were available during my care of the patient were reviewed by me and considered in my medical decision making (see chart for details).    MDM Rules/Calculators/A&P Patient was originally seen on the ninth and given antibiotics and a tetanus shot.  No rabies treatment was given at that time.  Patient denies any symptoms except for headache that is begun today.  Reports he has chronic headaches and this feels the same however he is concerned because he has read things on the Internet.  We discussed that we are out of the window for rabies postexposure treatment.  He will follow-up with his primary care provider and animal control about the dog status.   Final Clinical Impression(s) / ED Diagnoses Final diagnoses:  Dog bite, subsequent encounter    Rx / DC Orders Results and diagnoses were explained to the patient. Return precautions discussed in full. Patient had no additional questions and expressed complete understanding.    Rhae Hammock, PA-C 01/01/21 1191    Jeanell Sparrow, DO 01/01/21 1626

## 2021-01-02 NOTE — Patient Instructions (Signed)
Below is our plan:  We will continue Emgality monthly. Restart propranolol 20mg  at bedtime. Continue rizatriptan as needed.  Please make sure you are staying well hydrated. I recommend 50-60 ounces daily. Well balanced diet and regular exercise encouraged. Consistent sleep schedule with 6-8 hours recommended.   Please continue follow up with care team as directed.   Follow up with sleep med as directed   You may receive a survey regarding today's visit. I encourage you to leave honest feed back as I do use this information to improve patient care. Thank you for seeing me today!

## 2021-01-02 NOTE — Progress Notes (Signed)
PATIENT: Jason Ortega DOB: Dec 01, 1965  REASON FOR VISIT: follow up HISTORY FROM: patient  Virtual Visit via Telephone Note  I connected with [name] on 01/03/21 at  8:15 AM EST by telephone and verified that I am speaking with the correct person using two identifiers.   I discussed the limitations, risks, security and privacy concerns of performing an evaluation and management service by telephone and the availability of in person appointments. I also discussed with the patient that there may be a patient responsible charge related to this service. The patient expressed understanding and agreed to proceed.   History of Present Illness:  01/03/21 ALL:  [name] returns for follow up for migraines. We continued Amovig and rizatriptan at last visit and added propranolol 20mg  BID at last visit 04/2020. He reports that he was unable to tolerate side effects of sleepiness and stopped this medication. Amovig was switched to 05/2020 due to insurance preference. Headaches are stable. He has about 2 severe migraines a month that requires he call out of work. He has 2-10 headache days a month. Rizatriptan does help. He has not been able to follow up with CPAP provider. He reports using CPAP nightly. He would like to transfer care to our office. Last sleep evaluation about 5 years ago.   04/20/20 ALL: He returns for follow up for migraines. He continues Amovig and rizatriptan. Also taking verapamil 180mg  daily and gabapentin 600mg  at night PRN prescribed by his PCP for HTN and sleep. He continues to have a tension style, bifrontal, dull headache everyday. He has had 2-3 migraines since January. Rizatriptan is working well for abortive therapy. He admits that BP has been a little higher than normal. Usually 130/80's at home but has been 150's/80's. He is on multiple hypertensive agents.  He is using CPAP nightly. He has not been seen by sleep provider. He called to schedule appt for January that was  cancelled by the provider.  01/11/2020 ALL:  Taz Vanness is a 55 y.o. male here today for follow up for migraine headaches.  He continues Aimovig monthly.  He has continued to have regular headaches.  He reports to bad migraines over the past 8 months.  He has used rizatriptan and sumatriptan regularly, usually taking a full prescription of each medication every month.  He reports that rizatriptan seems to be a little bit more effective than sumatriptan.  He reports that he is using CPAP nightly.  He has not been seen by his sleep provider since March 2019.  He states that he has continued to receive supplies through DME.  He continues follow-up with PCP regularly.   Observations/Objective:  Generalized: Well developed, in no acute distress  Mentation: Alert oriented to time, place, history taking. Follows all commands speech and language fluent   Assessment and Plan:  55 y.o. year old male  has a past medical history of Allergy, Anemia, Arthritis, Cataract, Depression, Diabetes mellitus, Frequent headaches, GERD (gastroesophageal reflux disease), Glaucoma, Hypercholesteremia, Hypertension, Migraines, and Sleep apnea. here with    ICD-10-CM   1. Chronic migraine w/o aura w/o status migrainosus, not intractable  G43.709     2. OSA on CPAP  G47.33 Ambulatory referral to Sleep Studies   Z99.89       [name] reports improvement in headaches since last visit. He has about 2-10 headache days a month with 2 severe migraines. Rizatriptan does seem to work well. He is using 5-10 tablets of rizatriptan every month. Propranolol 20mg  BID caused sleepiness.  We will have him take 20mg  daily at bedtime to see if this helps. He will continue Emgality monthly and rizatriptan as needed. I will refer him to sleep med to transfer management of OSA on CPAP. Healthy lifestyle habits reviewed.  I would like to follow-up with in 3 months.  He verbalizes understanding and agreement with this plan.  Orders  Placed This Encounter  Procedures   Ambulatory referral to Sleep Studies    Referral Priority:   Routine    Referral Type:   Consultation    Referral Reason:   Specialty Services Required    Number of Visits Requested:   1     Meds ordered this encounter  Medications   propranolol (INDERAL) 20 MG tablet    Sig: Take 1 tablet (20 mg total) by mouth at bedtime.    Dispense:  90 tablet    Refill:  3    Order Specific Question:   Supervising Provider    Answer:   [name] Anson Fret      Follow Up Instructions:  I discussed the assessment and treatment plan with the patient. The patient was provided an opportunity to ask questions and all were answered. The patient agreed with the plan and demonstrated an understanding of the instructions.   The patient was advised to call back or seek an in-person evaluation if the symptoms worsen or if the condition fails to improve as anticipated.  I provided 15 minutes of non-face-to-face time during this encounter. Mychart visit converted to televisit due to technical concerns. Patient is located at his place of employment, provider is in the office.    [id number], NP

## 2021-01-03 ENCOUNTER — Encounter: Payer: Self-pay | Admitting: Family Medicine

## 2021-01-03 ENCOUNTER — Telehealth (INDEPENDENT_AMBULATORY_CARE_PROVIDER_SITE_OTHER): Payer: 59 | Admitting: Family Medicine

## 2021-01-03 DIAGNOSIS — Z9989 Dependence on other enabling machines and devices: Secondary | ICD-10-CM

## 2021-01-03 DIAGNOSIS — G43709 Chronic migraine without aura, not intractable, without status migrainosus: Secondary | ICD-10-CM | POA: Diagnosis not present

## 2021-01-03 DIAGNOSIS — G4733 Obstructive sleep apnea (adult) (pediatric): Secondary | ICD-10-CM

## 2021-01-03 MED ORDER — PROPRANOLOL HCL 20 MG PO TABS: 20.0000 mg | ORAL_TABLET | Freq: Every day | ORAL | 3 refills | Status: DC

## 2021-01-04 NOTE — Progress Notes (Signed)
[place] Phone: [phone] Subjective:   Jason Ortega, am serving as a scribe for Dr. Hulan Saas.  This visit occurred during the SARS-CoV-2 public health emergency.  Safety protocols were in place, including screening questions prior to the visit, additional usage of staff PPE, and extensive cleaning of exam room while observing appropriate contact time as indicated for disinfecting solutions.   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  Patient did fall and does have what appears to be a contusion secondary to a work-related injury.  Also believe the patient could have had a potential mild subluxation and given a knee brace for stability of the patella.  Discussed medications including meloxicam secondary to the effusion we noted as well as the icing regimen.  Seated work for a short course over the next 2 weeks and hopefully patient will be able to respond and get back to regular duty very quickly.  Update 01/04/2021 Jason Ortega is a 55 y.o. male coming in with complaint of R knee pain. Pain is slowly improving. Patient states that his L knee is starting to hurt over medial aspect.  Patient states that he has made some improvement but continues to have some difficulty.      Past Medical History:  Diagnosis Date   Allergy    Anemia    Arthritis    Cataract    bilateral cataracts removed   Depression    Diabetes mellitus    Frequent headaches    GERD (gastroesophageal reflux disease)    Glaucoma    Hypercholesteremia    Hypertension    Migraines    Sleep apnea    wears c-PAP   Past Surgical History:  Procedure Laterality Date   bilateral cataracts removed     CARDIAC CATHETERIZATION  12/2011   COLONOSCOPY     LEFT HEART  CATHETERIZATION WITH CORONARY ANGIOGRAM N/A 11/12/2011   Procedure: LEFT HEART CATHETERIZATION WITH CORONARY ANGIOGRAM;  Surgeon: Laverda Page, MD;  Location: [facility] CATH LAB;  Service: Cardiovascular;  Laterality: N/A;   SHOULDER SURGERY     Social History   Socioeconomic History   Marital status: Single    Spouse name: Not on file   Number of children: 2   Years of education: 52   Highest education level: Not on file  Occupational History   Occupation: Clinical research associate  Tobacco Use   Smoking status: Never   Smokeless tobacco: Never  Vaping Use   Vaping Use: Never used  Substance and Sexual Activity   Alcohol use: Yes    Comment: occasional   Drug use: Ortega   Sexual activity: Yes    Birth control/protection: None  Other Topics Concern   Not on file  Social History Narrative   Fun/Hobby: Leisure centre manager football   Social Determinants of Health   Financial Resource Strain: Not on file  Food Insecurity: Not on file  Transportation Needs: Not on file  Physical Activity: Not on file  Stress: Not on file  Social Connections: Not on file   Allergies  Allergen Reactions   Pollen Extract Other (See Comments)    Stuffy nose   Family History  Problem Relation Age of Onset   Arthritis Mother    Stroke Mother  Hypertension Father    Diabetes Father    Cancer Father        Multiple Myleoma   Diabetes Maternal Grandfather    Colon cancer Neg Hx    Esophageal cancer Neg Hx    Rectal cancer Neg Hx    Stomach cancer Neg Hx     Current Outpatient Medications (Endocrine & Metabolic):    FARXIGA 10 MG TABS tablet, TAKE 1 TABLET BY MOUTH EVERY DAY   metFORMIN (GLUCOPHAGE-XR) 500 MG 24 hr tablet, TAKE 4 TABLETS BY MOUTH DAILY WITH BREAKFAST   Semaglutide, 2 MG/DOSE, (OZEMPIC, 2 MG/DOSE,) 8 MG/3ML SOPN, Inject 2 mg into the skin once a week.  Current Outpatient Medications (Cardiovascular):    atorvastatin (LIPITOR) 40 MG tablet, TAKE 1 TABLET BY MOUTH EVERY DAY   losartan-hydrochlorothiazide  (HYZAAR) 100-25 MG tablet, Take 1 tablet by mouth daily.   propranolol (INDERAL) 20 MG tablet, Take 1 tablet (20 mg total) by mouth at bedtime.   sildenafil (REVATIO) 20 MG tablet, Take 20 mg by mouth. 3-5 tabs prn   verapamil (CALAN-SR) 180 MG CR tablet, Take 1 tablet (180 mg total) by mouth daily.   Current Outpatient Medications (Analgesics):    aspirin EC 325 MG tablet, Take 1 tablet (325 mg total) by mouth daily.   Galcanezumab-gnlm (EMGALITY) 120 MG/ML SOAJ, Inject 120 mg into the skin every 30 (thirty) days.   ibuprofen (ADVIL,MOTRIN) 600 MG tablet, TAKE 1 TABLET(600 MG) BY MOUTH EVERY 8 HOURS AS NEEDED   meloxicam (MOBIC) 15 MG tablet, Take 1 tablet (15 mg total) by mouth daily.   rizatriptan (MAXALT-MLT) 10 MG disintegrating tablet, Take 1 tablet (10 mg total) by mouth as needed for migraine. May repeat in 2 hours if needed   Current Outpatient Medications (Other):    amoxicillin-clavulanate (AUGMENTIN) 875-125 MG tablet, Take 1 tablet by mouth every 12 (twelve) hours.   baclofen (LIORESAL) 10 MG tablet, Take 1 tablet (10 mg total) by mouth 3 (three) times daily. Prn for IBS flare   Blood Glucose Monitoring Suppl (ONETOUCH VERIO) w/Device KIT, 1 Device by Does not apply route 2 (two) times daily as needed. (Patient taking differently: 1 Device by Does not apply route 2 (two) times daily. E11.9)   diclofenac sodium (VOLTAREN) 1 % GEL, Apply 2 g topically 4 (four) times daily.   dicyclomine (BENTYL) 10 MG capsule, TAKE 1 CAPSULE (10 MG TOTAL) BY MOUTH 4 (FOUR) TIMES DAILY - BEFORE MEALS AND AT BEDTIME.   glucose blood (ONETOUCH VERIO) test strip, 1 each by Other route 2 (two) times daily. E11.9   methocarbamol (ROBAXIN) 500 MG tablet, TAKE 1 TABLET BY MOUTH TWICE DAILY (Patient taking differently: Take 500 mg by mouth every 6 (six) hours as needed.)   omeprazole (PRILOSEC) 40 MG capsule, TAKE 1 CAPSULE IN THE [facility] AND AT BEDTIME   OneTouch Delica Lancets 31D MISC, 1 each by Does not  apply route 2 (two) times daily. E11.9   PENNSAID 2 % SOLN, SMARTSIG:2 Pump Topical Twice Daily   timolol (TIMOPTIC-XR) 0.5 % ophthalmic gel-forming, Place 1 drop into both eyes daily.    Reviewed prior external information including notes and imaging from  primary care provider As well as notes that were available from care everywhere and other healthcare systems.  Past medical history, social, surgical and family history all reviewed in electronic medical record.  Ortega pertanent information unless stated regarding to the chief complaint.   Review of Systems:  Ortega headache, visual changes, nausea,  vomiting, diarrhea, constipation, dizziness, abdominal pain, skin rash, fevers, chills, night sweats, weight loss, swollen lymph nodes, body aches, joint swelling, chest pain, shortness of breath, mood changes. POSITIVE muscle aches  Objective  Blood pressure (!) 132/94, pulse 71, height _0  (1.727 m), weight 213 lb (96.6 kg), SpO2 99 %.   General: Ortega apparent distress alert and oriented x3 mood and affect normal, dressed appropriately.  HEENT: Pupils equal, extraocular movements intact  Respiratory: Patient's speak in full sentences and does not appear short of breath  Cardiovascular: Ortega lower extremity edema, non tender, Ortega erythema  Gait normal with good balance and coordination.  MSK:  right knee has significant less inflammation than previous exam.  Patient is less tender as well.  Still some tenderness over the medial joint line.  More pain with varus stress over the medial aspect of the knee.  Negative McMurray's.  Still some pain with patellar grind.  Limited muscular skeletal ultrasound was performed and interpreted by Hulan Saas, M  Limited ultrasound of patient's knee shows patient does have a cortical irregularity noted of the superior medial aspect of the patella.  Seems to be more horizontal and more of a starburst formation.  Mild hypoechoic changes of the superficial to this  area.  Patient's effusion previously of the patellofemoral joint is significantly down.  Patient noted does have some hypoechoic changes noted of the proximal deep fibers of the MCL.  Ortega significant gapping noted noted on dynamic testing.  Meniscus still has chronic changes with very mild displacement. Impression: Decreased effusion, questionable small cortical irregularity of the patella with a partial MCL injury.    Impression and Recommendations:     The above documentation has been reviewed and is accurate and complete Lyndal Pulley, DO

## 2021-01-06 ENCOUNTER — Other Ambulatory Visit: Payer: Self-pay

## 2021-01-06 ENCOUNTER — Ambulatory Visit: Payer: Self-pay

## 2021-01-06 ENCOUNTER — Encounter: Payer: Self-pay | Admitting: Family Medicine

## 2021-01-06 ENCOUNTER — Ambulatory Visit (INDEPENDENT_AMBULATORY_CARE_PROVIDER_SITE_OTHER): Payer: 59 | Admitting: Family Medicine

## 2021-01-06 VITALS — BP 132/94 | HR 71 | Ht 68.0 in | Wt 213.0 lb

## 2021-01-06 DIAGNOSIS — M94261 Chondromalacia, right knee: Secondary | ICD-10-CM

## 2021-01-06 DIAGNOSIS — M25561 Pain in right knee: Secondary | ICD-10-CM

## 2021-01-06 NOTE — Assessment & Plan Note (Signed)
Patient on ultrasound today does have an area where it does seem that impact may have caused a potential small fracture.  In addition of this note when we look patient's MCL does have partial tearing noted.  No gapping on dynamic testing well.  Discussed with patient is to still avoid twisting motions, deep bending, and avoid significant amount of walking at work for the next 6 weeks.  Patient will continue with the home exercises at this time.  Can use anti-inflammatories especially topical for any breakthrough pain.  Patient knows if any increasing instability we may need to consider the possibility of advanced imaging.  Otherwise patient can follow-up with me again in 6 weeks for hopefully we will fully release patient.

## 2021-01-06 NOTE — Patient Instructions (Signed)
See me again in 6 weeks 

## 2021-01-24 DIAGNOSIS — Z0289 Encounter for other administrative examinations: Secondary | ICD-10-CM

## 2021-02-14 ENCOUNTER — Telehealth: Payer: 59 | Admitting: Family Medicine

## 2021-02-15 NOTE — Progress Notes (Signed)
[place] Phone: [phone] Subjective:   Jason Ortega, am serving as a scribe for Dr. Hulan Saas.This visit occurred during the SARS-CoV-2 public health emergency.  Safety protocols were in place, including screening questions prior to the visit, additional usage of staff PPE, and extensive cleaning of exam room while observing appropriate contact time as indicated for disinfecting solutions.  I'm seeing this patient by the request  of:  Jason Borg, MD  CC: Right knee pain follow-up  [id number]  01/06/2021 Patient on ultrasound today does have an area where it does seem that impact may have caused a potential small fracture.  In addition of this note when we look patient's MCL does have partial tearing noted.  Ortega gapping on dynamic testing well.  Discussed with patient is to still avoid twisting motions, deep bending, and avoid significant amount of walking at work for the next 6 weeks.  Patient will continue with the home exercises at this time.  Can use anti-inflammatories especially topical for any breakthrough pain.  Patient knows if any increasing instability we may need to consider the possibility of advanced imaging.  Otherwise patient can follow-up with me again in 6 weeks for hopefully we will fully release patient.  Update 02/17/2021 Jason Ortega is a 56 y.o. male coming in with complaint of R knee pain. Patient states that he feels that he is doing somewhat better. Wears brace with work. Notes crepitus in knee but not as much pain.  This likely did happen while patient was at work on November which he did have a attack of a dog.  Patient states that he is feeling 80 to 90% better.  Would like to start increasing activity.  He does not feel like he has had any worsening symptoms.    Past Medical History:  Diagnosis Date   Allergy    Anemia    Arthritis    Cataract    bilateral cataracts removed    Depression    Diabetes mellitus    Frequent headaches    GERD (gastroesophageal reflux disease)    Glaucoma    Hypercholesteremia    Hypertension    Migraines    Sleep apnea    wears c-PAP   Past Surgical History:  Procedure Laterality Date   bilateral cataracts removed     CARDIAC CATHETERIZATION  12/2011   COLONOSCOPY     LEFT HEART CATHETERIZATION WITH CORONARY ANGIOGRAM N/A 11/12/2011   Procedure: LEFT HEART CATHETERIZATION WITH CORONARY ANGIOGRAM;  Surgeon: Laverda Page, MD;  Location: [facility] CATH LAB;  Service: Cardiovascular;  Laterality: N/A;   SHOULDER SURGERY     Social History   Socioeconomic History   Marital status: Single    Spouse name: Not on file   Number of children: 2   Years of education: 75   Highest education level: Not on file  Occupational History   Occupation: Stocker  Tobacco Use   Smoking status: Never   Smokeless tobacco: Never  Vaping Use   Vaping Use: Never used  Substance and Sexual Activity   Alcohol use: Yes    Comment: occasional   Drug use: Ortega   Sexual activity: Yes    Birth control/protection: None  Other Topics Concern   Not on file  Social History Narrative   Fun/Hobby: Leisure centre manager football   Social Determinants of Health   Financial Resource Strain: Not on file  Food Insecurity: Not on file  Transportation Needs: Not on file  Physical Activity: Not on file  Stress: Not on file  Social Connections: Not on file   Allergies  Allergen Reactions   Pollen Extract Other (See Comments)    Stuffy nose   Family History  Problem Relation Age of Onset   Arthritis Mother    Stroke Mother    Hypertension Father    Diabetes Father    Cancer Father        Multiple Myleoma   Diabetes Maternal Grandfather    Colon cancer Neg Hx    Esophageal cancer Neg Hx    Rectal cancer Neg Hx    Stomach cancer Neg Hx     Current Outpatient Medications (Endocrine & Metabolic):    FARXIGA 10 MG TABS tablet, TAKE 1 TABLET BY MOUTH EVERY DAY    metFORMIN (GLUCOPHAGE-XR) 500 MG 24 hr tablet, TAKE 4 TABLETS BY MOUTH DAILY WITH BREAKFAST   Semaglutide, 2 MG/DOSE, (OZEMPIC, 2 MG/DOSE,) 8 MG/3ML SOPN, Inject 2 mg into the skin once a week.  Current Outpatient Medications (Cardiovascular):    atorvastatin (LIPITOR) 40 MG tablet, TAKE 1 TABLET BY MOUTH EVERY DAY   losartan-hydrochlorothiazide (HYZAAR) 100-25 MG tablet, Take 1 tablet by mouth daily.   propranolol (INDERAL) 20 MG tablet, Take 1 tablet (20 mg total) by mouth at bedtime.   sildenafil (REVATIO) 20 MG tablet, Take 20 mg by mouth. 3-5 tabs prn   verapamil (CALAN-SR) 180 MG CR tablet, Take 1 tablet (180 mg total) by mouth daily.   Current Outpatient Medications (Analgesics):    aspirin EC 325 MG tablet, Take 1 tablet (325 mg total) by mouth daily.   Galcanezumab-gnlm (EMGALITY) 120 MG/ML SOAJ, Inject 120 mg into the skin every 30 (thirty) days.   ibuprofen (ADVIL,MOTRIN) 600 MG tablet, TAKE 1 TABLET(600 MG) BY MOUTH EVERY 8 HOURS AS NEEDED   meloxicam (MOBIC) 15 MG tablet, Take 1 tablet (15 mg total) by mouth daily.   rizatriptan (MAXALT-MLT) 10 MG disintegrating tablet, Take 1 tablet (10 mg total) by mouth as needed for migraine. May repeat in 2 hours if needed   Current Outpatient Medications (Other):    amoxicillin-clavulanate (AUGMENTIN) 875-125 MG tablet, Take 1 tablet by mouth every 12 (twelve) hours.   baclofen (LIORESAL) 10 MG tablet, Take 1 tablet (10 mg total) by mouth 3 (three) times daily. Prn for IBS flare   Blood Glucose Monitoring Suppl (ONETOUCH VERIO) w/Device KIT, 1 Device by Does not apply route 2 (two) times daily as needed. (Patient taking differently: 1 Device by Does not apply route 2 (two) times daily. E11.9)   diclofenac sodium (VOLTAREN) 1 % GEL, Apply 2 g topically 4 (four) times daily.   dicyclomine (BENTYL) 10 MG capsule, TAKE 1 CAPSULE (10 MG TOTAL) BY MOUTH 4 (FOUR) TIMES DAILY - BEFORE MEALS AND AT BEDTIME.   glucose blood (ONETOUCH VERIO) test  strip, 1 each by Other route 2 (two) times daily. E11.9   methocarbamol (ROBAXIN) 500 MG tablet, TAKE 1 TABLET BY MOUTH TWICE DAILY (Patient taking differently: Take 500 mg by mouth every 6 (six) hours as needed.)   omeprazole (PRILOSEC) 40 MG capsule, TAKE 1 CAPSULE IN THE [facility]  AND AT BEDTIME   OneTouch Delica Lancets 63O MISC, 1 each by Does not apply route 2 (two) times daily. E11.9   PENNSAID 2 % SOLN, SMARTSIG:2 Pump Topical Twice Daily   timolol (TIMOPTIC-XR) 0.5 % ophthalmic gel-forming, Place 1 drop into both eyes daily.    Reviewed prior  external information including notes and imaging from  primary care provider As well as notes that were available from care everywhere and other healthcare systems.  Past medical history, social, surgical and family history all reviewed in electronic medical record.  Ortega pertanent information unless stated regarding to the chief complaint.   Review of Systems:  Ortega headache, visual changes, nausea, vomiting, diarrhea, constipation, dizziness, abdominal pain, skin rash, fevers, chills, night sweats, weight loss, swollen lymph nodes, body aches, joint swelling, chest pain, shortness of breath, mood changes. POSITIVE muscle aches  Objective  Blood pressure (!) 140/92, pulse 74, height _0  (1.727 m), weight 217 lb (98.4 kg), SpO2 99 %.   General: Ortega apparent distress alert and oriented x3 mood and affect normal, dressed appropriately.  HEENT: Pupils equal, extraocular movements intact  Respiratory: Patient's speak in full sentences and does not appear short of breath  Cardiovascular: Ortega lower extremity edema, non tender, Ortega erythema  Gait normal with good balance and coordination.  MSK: Right knee exam has Ortega significant swelling noted.  Patient has fairly good range of motion.  Very minor tenderness over the medial joint space.  Patient has a negative McMurray's.  Patient noted does have gapping noted of the MCL the patient is nontender.  This is  considerably different than the contralateral side.  Mild lateral tracking of the patella noted with mild crepitus.  Limited muscular skeletal ultrasound was performed and interpreted by Hulan Saas, M  Limited ultrasound of patient's medial side of the knee does show that patient does have a medial meniscus tear noted with displacement.  Patient does still have chronic changes noted of the MCL.  Patient continues to have increased vascularity in the area that does appear to be neovascularization and some scar tissue formation. Impression: Still MCL injury, meniscal injury with possible interval scar tissue formation    Impression and Recommendations:     The above documentation has been reviewed and is accurate and complete Lyndal Pulley, DO

## 2021-02-17 ENCOUNTER — Ambulatory Visit (INDEPENDENT_AMBULATORY_CARE_PROVIDER_SITE_OTHER): Payer: 59 | Admitting: Family Medicine

## 2021-02-17 ENCOUNTER — Other Ambulatory Visit: Payer: Self-pay

## 2021-02-17 ENCOUNTER — Ambulatory Visit: Payer: Self-pay

## 2021-02-17 ENCOUNTER — Encounter: Payer: Self-pay | Admitting: Family Medicine

## 2021-02-17 VITALS — BP 140/92 | HR 74 | Ht 68.0 in | Wt 217.0 lb

## 2021-02-17 DIAGNOSIS — M25561 Pain in right knee: Secondary | ICD-10-CM

## 2021-02-17 DIAGNOSIS — G8929 Other chronic pain: Secondary | ICD-10-CM

## 2021-02-17 NOTE — Patient Instructions (Signed)
See me again in 4 weeks Worried about MCL consider MRI Try increasing activity at work

## 2021-02-17 NOTE — Assessment & Plan Note (Signed)
I am still concerned with patient at this time.  No significant swelling but patient does have the medial meniscus tear still noted with some mild displacement.  Patient with dynamic testing of the MCL also does show gapping.  This does seem to be a potential high-grade tear but does not seem to be full-thickness on ultrasound.  Would need further evaluation with MRI.  Patient would state though that he is feeling 80 to 90% better and would like to try to increase activity with work.  At this point we will decrease the limitations he has at work and increase slowly over the course of the next 4 weeks to full duty.  Patient will see me again in 4 weeks and hopefully will be released versus discussing the possibility of advanced imaging if still having some instability on exam.  Total time reviewing patient's previous imaging including MRI from last year, previous x-rays as well as seeing patient today 32 minutes

## 2021-02-27 ENCOUNTER — Other Ambulatory Visit: Payer: Self-pay

## 2021-02-27 ENCOUNTER — Encounter: Payer: Self-pay | Admitting: Internal Medicine

## 2021-02-27 ENCOUNTER — Ambulatory Visit (INDEPENDENT_AMBULATORY_CARE_PROVIDER_SITE_OTHER): Payer: 59 | Admitting: Internal Medicine

## 2021-02-27 VITALS — BP 132/70 | HR 63 | Temp 98.7°F | Ht 68.0 in | Wt 211.0 lb

## 2021-02-27 DIAGNOSIS — Z23 Encounter for immunization: Secondary | ICD-10-CM

## 2021-02-27 DIAGNOSIS — E538 Deficiency of other specified B group vitamins: Secondary | ICD-10-CM

## 2021-02-27 DIAGNOSIS — E559 Vitamin D deficiency, unspecified: Secondary | ICD-10-CM

## 2021-02-27 DIAGNOSIS — E785 Hyperlipidemia, unspecified: Secondary | ICD-10-CM | POA: Diagnosis not present

## 2021-02-27 DIAGNOSIS — Z0001 Encounter for general adult medical examination with abnormal findings: Secondary | ICD-10-CM | POA: Diagnosis not present

## 2021-02-27 DIAGNOSIS — I10 Essential (primary) hypertension: Secondary | ICD-10-CM

## 2021-02-27 DIAGNOSIS — E119 Type 2 diabetes mellitus without complications: Secondary | ICD-10-CM

## 2021-02-27 LAB — BASIC METABOLIC PANEL
BUN: 14 mg/dL (ref 6–23)
CO2: 31 mEq/L (ref 19–32)
Calcium: 9.6 mg/dL (ref 8.4–10.5)
Chloride: 100 mEq/L (ref 96–112)
Creatinine, Ser: 1.04 mg/dL (ref 0.40–1.50)
GFR: 79.78 mL/min (ref >60.00)
Glucose, Bld: 114 mg/dL — ABNORMAL HIGH (ref 70–99)
Potassium: 3.3 mEq/L — ABNORMAL LOW (ref 3.5–5.1)
Sodium: 140 mEq/L (ref 135–145)

## 2021-02-27 LAB — CBC WITH DIFFERENTIAL/PLATELET
Basophils Absolute: 0 10*3/uL (ref 0.0–0.1)
Basophils Relative: 0.7 % (ref 0.0–3.0)
Eosinophils Absolute: 0.1 10*3/uL (ref 0.0–0.7)
Eosinophils Relative: 3.1 % (ref 0.0–5.0)
HCT: 43.6 % (ref 39.0–52.0)
Hemoglobin: 13.6 g/dL (ref 13.0–17.0)
Lymphocytes Relative: 40.2 % (ref 12.0–46.0)
Lymphs Abs: 1.6 10*3/uL (ref 0.7–4.0)
MCHC: 31.2 g/dL (ref 30.0–36.0)
MCV: 81.8 fl (ref 78.0–100.0)
Monocytes Absolute: 0.5 10*3/uL (ref 0.1–1.0)
Monocytes Relative: 11.2 % (ref 3.0–12.0)
Neutro Abs: 1.8 10*3/uL (ref 1.4–7.7)
Neutrophils Relative %: 44.8 % (ref 43.0–77.0)
Platelets: 240 10*3/uL (ref 150.0–400.0)
RBC: 5.33 Mil/uL (ref 4.22–5.81)
RDW: 15.8 % — ABNORMAL HIGH (ref 11.5–15.5)
WBC: 4 10*3/uL (ref 4.0–10.5)

## 2021-02-27 LAB — PSA: PSA: 0.89 ng/mL (ref 0.10–4.00)

## 2021-02-27 LAB — VITAMIN B12: Vitamin B-12: 320 pg/mL (ref 211–911)

## 2021-02-27 LAB — MICROALBUMIN / CREATININE URINE RATIO
Creatinine,U: 88.1 mg/dL
Microalb Creat Ratio: 0.8 mg/g (ref 0.0–30.0)
Microalb, Ur: 0.7 mg/dL (ref 0.0–1.9)

## 2021-02-27 LAB — LIPID PANEL
Cholesterol: 135 mg/dL (ref 0–200)
HDL: 59.4 mg/dL (ref >39.00)
LDL Cholesterol: 64 mg/dL (ref 0–99)
NonHDL: 75.8
Total CHOL/HDL Ratio: 2
Triglycerides: 59 mg/dL (ref 0.0–149.0)
VLDL: 11.8 mg/dL (ref 0.0–40.0)

## 2021-02-27 LAB — URINALYSIS, ROUTINE W REFLEX MICROSCOPIC
Bilirubin Urine: NEGATIVE
Hgb urine dipstick: NEGATIVE
Ketones, ur: NEGATIVE
Leukocytes,Ua: NEGATIVE
Nitrite: NEGATIVE
RBC / HPF: NONE SEEN (ref 0–?)
Specific Gravity, Urine: 1.01 (ref 1.000–1.030)
Total Protein, Urine: NEGATIVE
Urine Glucose: >=1000 — AB
Urobilinogen, UA: 1 (ref 0.0–1.0)
WBC, UA: NONE SEEN (ref 0–?)
pH: 7 (ref 5.0–8.0)

## 2021-02-27 LAB — VITAMIN D 25 HYDROXY (VIT D DEFICIENCY, FRACTURES): VITD: 26.83 ng/mL — ABNORMAL LOW (ref 30.00–100.00)

## 2021-02-27 LAB — TSH: TSH: 0.81 u[IU]/mL (ref 0.35–5.50)

## 2021-02-27 LAB — HEPATIC FUNCTION PANEL
ALT: 27 U/L (ref 0–53)
AST: 23 U/L (ref 0–37)
Albumin: 4.4 g/dL (ref 3.5–5.2)
Alkaline Phosphatase: 52 U/L (ref 39–117)
Bilirubin, Direct: 0.1 mg/dL (ref 0.0–0.3)
Total Bilirubin: 0.4 mg/dL (ref 0.2–1.2)
Total Protein: 7.7 g/dL (ref 6.0–8.3)

## 2021-02-27 LAB — HEMOGLOBIN A1C: Hgb A1c MFr Bld: 6.9 % — ABNORMAL HIGH (ref 4.6–6.5)

## 2021-02-27 NOTE — Assessment & Plan Note (Signed)
Last vitamin D Lab Results  Component Value Date   VD25OH 31.28 03/03/2020   Low, to start oral replacement

## 2021-02-27 NOTE — Assessment & Plan Note (Signed)
Age and sex appropriate education and counseling updated with regular exercise and diet Referrals for preventative services - none needed Immunizations addressed - for pneumovax today Smoking counseling  - none needed Evidence for depression or other mood disorder - none significant Most recent labs reviewed. I have personally reviewed and have noted: 1) the patient's medical and social history 2) The patient's current medications and supplements 3) The patient's height, weight, and BMI have been recorded in the chart  

## 2021-02-27 NOTE — Patient Instructions (Addendum)
Please consider the shingrix shingles shot if covered by your insurance  You had the pneumovax pneumonia shot today  Please take OTC Vitamin D3 at 2000 units per day, indefinitely, just like any other medication  Please continue all other medications as before, and refills have been done if requested.  Please have the pharmacy call with any other refills you may need.  Please continue your efforts at being more active, low cholesterol diet, and weight control.  You are otherwise up to date with prevention measures today.  Please keep your appointments with your specialists as you may have planned - endocrinology  Please go to the LAB at the blood drawing area for the tests to be done  You will be contacted by phone if any changes need to be made immediately.  Otherwise, you will receive a letter about your results with an explanation, but please check with MyChart first.  Please remember to sign up for MyChart if you have not done so, as this will be important to you in the future with finding out test results, communicating by private email, and scheduling acute appointments online when needed.  Please make an Appointment to return for your 1 year visit, or sooner if needed

## 2021-02-27 NOTE — Assessment & Plan Note (Signed)
Lab Results  Component Value Date   HGBA1C 6.2 (A) 10/27/2020   Stable, pt to continue current medical treatment ozempic, metformin, farxiga

## 2021-02-27 NOTE — Progress Notes (Signed)
Patient ID: Jason Ortega, male   DOB: 05-Sep-1965, 56 y.o.   MRN: [id number]         Chief Complaint:: wellness exam and low vit d, htn, hld, dm       HPI:  [name] is a 56 y.o. male here for wellness exam; has eye appt sched for feb 3, due for pneumovax, o/w up to date.  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                          Also not taking vit d.  Pt denies chest pain, increased sob or doe, wheezing, orthopnea, PND, increased LE swelling, palpitations, dizziness or syncope.   Pt denies polydipsia, polyuria, or low sugar symptoms Overall good med compliance.   Pt denies fever, wt loss, night sweats, loss of appetite, or other constitutional symptoms   Denies worsening reflux, abd pain, dysphagia, n/v, bowel change or blood.    Wt Readings from Last 3 Encounters:  02/27/21 211 lb (95.7 kg)  02/17/21 217 lb (98.4 kg)  01/06/21 213 lb (96.6 kg)   BP Readings from Last 3 Encounters:  02/27/21 132/70  02/17/21 (!) 140/92  01/06/21 (!) 132/94   Immunization History  Administered Date(s) Administered   Influenza,inj,Quad PF,6+ Mos 12/07/2016, 12/20/2017, 10/16/2018   Influenza-Unspecified 02/22/2020, 01/19/2021   PFIZER(Purple Top)SARS-COV-2 Vaccination 02/06/2019, 03/02/2019, 05/14/2019, 12/07/2019   Pneumococcal Conjugate-13 08/22/2018   Pneumococcal Polysaccharide-23 02/05/2013   Tdap 02/06/2012, 12/14/2020   There are no preventive care reminders to display for this patient.     Past Medical History:  Diagnosis Date   Allergy    Anemia    Arthritis    Cataract    bilateral cataracts removed   Depression    Diabetes mellitus    Frequent headaches    GERD (gastroesophageal reflux disease)    Glaucoma    Hypercholesteremia    Hypertension    Migraines    Sleep apnea    wears c-PAP   Past Surgical History:  Procedure Laterality Date   bilateral cataracts removed     CARDIAC CATHETERIZATION  12/2011   COLONOSCOPY     LEFT HEART  CATHETERIZATION WITH CORONARY ANGIOGRAM N/A 11/12/2011   Procedure: LEFT HEART CATHETERIZATION WITH CORONARY ANGIOGRAM;  Surgeon: Laverda Page, MD;  Location: [facility] CATH LAB;  Service: Cardiovascular;  Laterality: N/A;   SHOULDER SURGERY      reports that he has never smoked. He has never used smokeless tobacco. He reports current alcohol use. He reports that he does not use drugs. family history includes Arthritis in his mother; Cancer in his father; Diabetes in his father and maternal grandfather; Hypertension in his father; Stroke in his mother. Allergies  Allergen Reactions   Pollen Extract Other (See Comments)    Stuffy nose   Current Outpatient Medications on File Prior to Visit  Medication Sig Dispense Refill   aspirin EC 325 MG tablet Take 1 tablet (325 mg total) by mouth daily. 30 tablet 99   atorvastatin (LIPITOR) 40 MG tablet TAKE 1 TABLET BY MOUTH EVERY DAY 90 tablet 3   baclofen (LIORESAL) 10 MG tablet Take 1 tablet (10 mg total) by mouth 3 (three) times daily. Prn for IBS flare 30 each 0   Blood Glucose Monitoring Suppl (ONETOUCH VERIO) w/Device KIT 1 Device by Does not apply route 2 (two) times daily as needed. (Patient taking differently: 1 Device by Does not  apply route 2 (two) times daily. E11.9) 1 kit 0   diclofenac sodium (VOLTAREN) 1 % GEL Apply 2 g topically 4 (four) times daily. 200 g 5   dicyclomine (BENTYL) 10 MG capsule TAKE 1 CAPSULE (10 MG TOTAL) BY MOUTH 4 (FOUR) TIMES DAILY - BEFORE MEALS AND AT BEDTIME. 90 capsule 3   FARXIGA 10 MG TABS tablet TAKE 1 TABLET BY MOUTH EVERY DAY 90 tablet 1   Galcanezumab-gnlm (EMGALITY) 120 MG/ML SOAJ Inject 120 mg into the skin every 30 (thirty) days. 1 mL 11   glucose blood (ONETOUCH VERIO) test strip 1 each by Other route 2 (two) times daily. E11.9     ibuprofen (ADVIL,MOTRIN) 600 MG tablet TAKE 1 TABLET(600 MG) BY MOUTH EVERY 8 HOURS AS NEEDED 30 tablet 0   losartan-hydrochlorothiazide (HYZAAR) 100-25 MG tablet Take 1 tablet  by mouth daily. 90 tablet 3   meloxicam (MOBIC) 15 MG tablet Take 1 tablet (15 mg total) by mouth daily. 30 tablet 5   metFORMIN (GLUCOPHAGE-XR) 500 MG 24 hr tablet TAKE 4 TABLETS BY MOUTH DAILY WITH BREAKFAST 360 tablet 1   methocarbamol (ROBAXIN) 500 MG tablet TAKE 1 TABLET BY MOUTH TWICE DAILY (Patient taking differently: Take 500 mg by mouth every 6 (six) hours as needed.) 20 tablet 0   omeprazole (PRILOSEC) 40 MG capsule TAKE 1 CAPSULE IN THE [facility] AND AT BEDTIME 180 capsule 1   OneTouch Delica Lancets 95K MISC 1 each by Does not apply route 2 (two) times daily. E11.9     PENNSAID 2 % SOLN SMARTSIG:2 Pump Topical Twice Daily     propranolol (INDERAL) 20 MG tablet Take 1 tablet (20 mg total) by mouth at bedtime. 90 tablet 3   rizatriptan (MAXALT-MLT) 10 MG disintegrating tablet Take 1 tablet (10 mg total) by mouth as needed for migraine. May repeat in 2 hours if needed 9 tablet 11   Semaglutide, 2 MG/DOSE, (OZEMPIC, 2 MG/DOSE,) 8 MG/3ML SOPN Inject 2 mg into the skin once a week. 9 mL 3   sildenafil (REVATIO) 20 MG tablet Take 20 mg by mouth. 3-5 tabs prn     timolol (TIMOPTIC-XR) 0.5 % ophthalmic gel-forming Place 1 drop into both eyes daily.      verapamil (CALAN-SR) 180 MG CR tablet Take 1 tablet (180 mg total) by mouth daily. 90 tablet 3   No current facility-administered medications on file prior to visit.        ROS:  All others reviewed and negative.  Objective        PE:  BP 132/70 (BP Location: Right Arm, Patient Position: Sitting, Cuff Size: Large)    Pulse 63    Temp 98.7 F (37.1 C) (Oral)    Ht $R'5\' 8"'nY$  (1.727 m)    Wt 211 lb (95.7 kg)    SpO2 97%    BMI 32.08 kg/m                 Constitutional: Pt appears in NAD               HENT: Head: NCAT.                Right Ear: External ear normal.                 Left Ear: External ear normal.                Eyes: . Pupils are equal, round, and reactive to light. Conjunctivae and EOM are  normal               Nose: without d/c or  deformity               Neck: Neck supple. Gross normal ROM               Cardiovascular: Normal rate and regular rhythm.                 Pulmonary/Chest: Effort normal and breath sounds without rales or wheezing.                Abd:  Soft, NT, ND, + BS, no organomegaly               Neurological: Pt is alert. At baseline orientation, motor grossly intact               Skin: Skin is warm. No rashes, no other new lesions, LE edema - none               Psychiatric: Pt behavior is normal without agitation   Micro: none  Cardiac tracings I have personally interpreted today:  none  Pertinent Radiological findings (summarize): none   Lab Results  Component Value Date   WBC 4.6 03/03/2020   HGB 13.7 03/03/2020   HCT 42.0 03/03/2020   PLT 265.0 03/03/2020   GLUCOSE 94 03/03/2020   CHOL 124 03/03/2020   TRIG 67.0 03/03/2020   HDL 50.00 03/03/2020   LDLCALC 60 03/03/2020   ALT 21 03/03/2020   AST 20 03/03/2020   NA 139 03/03/2020   K 3.9 03/03/2020   CL 101 03/03/2020   CREATININE 1.13 03/03/2020   BUN 15 03/03/2020   CO2 32 03/03/2020   TSH 1.01 03/03/2020   PSA 0.72 03/03/2020   HGBA1C 6.2 (A) 10/27/2020   MICROALBUR <0.7 03/03/2020   Assessment/Plan:  Paiton Fosco is a 56 y.o. Black or African American [2] male with  has a past medical history of Allergy, Anemia, Arthritis, Cataract, Depression, Diabetes mellitus, Frequent headaches, GERD (gastroesophageal reflux disease), Glaucoma, Hypercholesteremia, Hypertension, Migraines, and Sleep apnea.  Vitamin D deficiency Last vitamin D Lab Results  Component Value Date   VD25OH 31.28 03/03/2020   Low, to start oral replacement   Encounter for well adult exam with abnormal findings Age and sex appropriate education and counseling updated with regular exercise and diet Referrals for preventative services - none needed Immunizations addressed - for pneumovax today Smoking counseling  - none needed Evidence for depression or  other mood disorder - none significant Most recent labs reviewed. I have personally reviewed and have noted: 1) the patient's medical and social history 2) The patient's current medications and supplements 3) The patient's height, weight, and BMI have been recorded in the chart   Hyperlipidemia LDL goal <100 Lab Results  Component Value Date   LDLCALC 60 03/03/2020   Stable, pt to continue current statin  lipitor 40   Hypertension, uncontrolled BP Readings from Last 3 Encounters:  02/27/21 132/70  02/17/21 (!) 140/92  01/06/21 (!) 132/94   Stable, pt to continue medical treatment hyzaar, inderal, calan   Type 2 diabetes mellitus without complication, without long-term current use of insulin (HCC) Lab Results  Component Value Date   HGBA1C 6.2 (A) 10/27/2020   Stable, pt to continue current medical treatment ozempic, metformin, farxiga  Followup: Return in about 1 year (around 02/27/2022).  Cathlean Cower, MD 02/27/2021 9:02 AM [facility] -  [facility] Internal Medicine

## 2021-02-27 NOTE — Assessment & Plan Note (Signed)
BP Readings from Last 3 Encounters:  02/27/21 132/70  02/17/21 (!) 140/92  01/06/21 (!) 132/94   Stable, pt to continue medical treatment hyzaar, inderal, calan

## 2021-02-27 NOTE — Assessment & Plan Note (Signed)
Lab Results  Component Value Date   LDLCALC 60 03/03/2020   Stable, pt to continue current statin  lipitor 40

## 2021-03-23 NOTE — Progress Notes (Signed)
Jason Ortega [place] [address] [place] Phone: [phone] Subjective:   IVilma Ortega, am serving as a scribe for Dr. Hulan Saas. This visit occurred during the SARS-CoV-2 public health emergency.  Safety protocols were in place, including screening questions prior to the visit, additional usage of staff PPE, and extensive cleaning of exam room while observing appropriate contact time as indicated for disinfecting solutions.   I'm seeing this patient by the request  of:  Biagio Borg, MD  CC: knee pain   [id number]  02/17/2021 I am still concerned with patient at this time.  No significant swelling but patient does have the medial meniscus tear still noted with some mild displacement.  Patient with dynamic testing of the MCL also does show gapping.  This does seem to be a potential high-grade tear but does not seem to be full-thickness on ultrasound.  Would need further evaluation with MRI.  Patient would state though that he is feeling 80 to 90% better and would like to try to increase activity with work.  At this point we will decrease the limitations he has at work and increase slowly over the course of the next 4 weeks to full duty.  Patient will see me again in 4 weeks and hopefully will be released versus discussing the possibility of advanced imaging if still having some instability on exam.  Total time reviewing patient's previous imaging including MRI from last year, previous x-rays as well as seeing patient today 32 minutes  Update 03/28/2021 Jason Ortega is a 56 y.o. male coming in with complaint of R knee pain. Patient states feel like the pain is getting a little better. Meloxicam seems to be working. No new complaints. Overall when patient does walk it does seem to have more of a weakness than truly pain.     Past Medical History:  Diagnosis Date   Allergy    Anemia    Arthritis    Cataract    bilateral cataracts removed    Depression    Diabetes mellitus    Frequent headaches    GERD (gastroesophageal reflux disease)    Glaucoma    Hypercholesteremia    Hypertension    Migraines    Sleep apnea    wears c-PAP   Past Surgical History:  Procedure Laterality Date   bilateral cataracts removed     CARDIAC CATHETERIZATION  12/2011   COLONOSCOPY     LEFT HEART CATHETERIZATION WITH CORONARY ANGIOGRAM N/A 11/12/2011   Procedure: LEFT HEART CATHETERIZATION WITH CORONARY ANGIOGRAM;  Surgeon: Laverda Page, MD;  Location: [facility] CATH LAB;  Service: Cardiovascular;  Laterality: N/A;   SHOULDER SURGERY     Social History   Socioeconomic History   Marital status: Single    Spouse name: Not on file   Number of children: 2   Years of education: 44   Highest education level: Not on file  Occupational History   Occupation: Stocker  Tobacco Use   Smoking status: Never   Smokeless tobacco: Never  Vaping Use   Vaping Use: Never used  Substance and Sexual Activity   Alcohol use: Yes    Comment: occasional   Drug use: No   Sexual activity: Yes    Birth control/protection: None  Other Topics Concern   Not on file  Social History Narrative   Fun/Hobby: Leisure centre manager football   Social Determinants of Health   Financial Resource Strain: Not on file  Food Insecurity:  Not on file  Transportation Needs: Not on file  Physical Activity: Not on file  Stress: Not on file  Social Connections: Not on file   Allergies  Allergen Reactions   Pollen Extract Other (See Comments)    Stuffy nose   Family History  Problem Relation Age of Onset   Arthritis Mother    Stroke Mother    Hypertension Father    Diabetes Father    Cancer Father        Multiple Myleoma   Diabetes Maternal Grandfather    Colon cancer Neg Hx    Esophageal cancer Neg Hx    Rectal cancer Neg Hx    Stomach cancer Neg Hx     Current Outpatient Medications (Endocrine & Metabolic):    FARXIGA 10 MG TABS tablet, TAKE 1 TABLET BY MOUTH EVERY DAY    metFORMIN (GLUCOPHAGE-XR) 500 MG 24 hr tablet, TAKE 4 TABLETS BY MOUTH DAILY WITH BREAKFAST   Semaglutide, 2 MG/DOSE, (OZEMPIC, 2 MG/DOSE,) 8 MG/3ML SOPN, Inject 2 mg into the skin once a week.  Current Outpatient Medications (Cardiovascular):    atorvastatin (LIPITOR) 40 MG tablet, TAKE 1 TABLET BY MOUTH EVERY DAY   losartan-hydrochlorothiazide (HYZAAR) 100-25 MG tablet, Take 1 tablet by mouth daily.   propranolol (INDERAL) 20 MG tablet, Take 1 tablet (20 mg total) by mouth at bedtime.   sildenafil (REVATIO) 20 MG tablet, Take 20 mg by mouth. 3-5 tabs prn   verapamil (CALAN-SR) 180 MG CR tablet, Take 1 tablet (180 mg total) by mouth daily.   Current Outpatient Medications (Analgesics):    aspirin EC 325 MG tablet, Take 1 tablet (325 mg total) by mouth daily.   Galcanezumab-gnlm (EMGALITY) 120 MG/ML SOAJ, Inject 120 mg into the skin every 30 (thirty) days.   ibuprofen (ADVIL,MOTRIN) 600 MG tablet, TAKE 1 TABLET(600 MG) BY MOUTH EVERY 8 HOURS AS NEEDED   meloxicam (MOBIC) 15 MG tablet, Take 1 tablet (15 mg total) by mouth daily.   rizatriptan (MAXALT-MLT) 10 MG disintegrating tablet, Take 1 tablet (10 mg total) by mouth as needed for migraine. May repeat in 2 hours if needed   Current Outpatient Medications (Other):    baclofen (LIORESAL) 10 MG tablet, Take 1 tablet (10 mg total) by mouth 3 (three) times daily. Prn for IBS flare   Blood Glucose Monitoring Suppl (ONETOUCH VERIO) w/Device KIT, 1 Device by Does not apply route 2 (two) times daily as needed. (Patient taking differently: 1 Device by Does not apply route 2 (two) times daily. E11.9)   diclofenac sodium (VOLTAREN) 1 % GEL, Apply 2 g topically 4 (four) times daily.   dicyclomine (BENTYL) 10 MG capsule, TAKE 1 CAPSULE (10 MG TOTAL) BY MOUTH 4 (FOUR) TIMES DAILY - BEFORE MEALS AND AT BEDTIME.   glucose blood (ONETOUCH VERIO) test strip, 1 each by Other route 2 (two) times daily. E11.9   methocarbamol (ROBAXIN) 500 MG tablet, TAKE 1  TABLET BY MOUTH TWICE DAILY (Patient taking differently: Take 500 mg by mouth every 6 (six) hours as needed.)   omeprazole (PRILOSEC) 40 MG capsule, TAKE 1 CAPSULE IN THE [facility] AND AT BEDTIME   OneTouch Delica Lancets 25O MISC, 1 each by Does not apply route 2 (two) times daily. E11.9   PENNSAID 2 % SOLN, SMARTSIG:2 Pump Topical Twice Daily   timolol (TIMOPTIC-XR) 0.5 % ophthalmic gel-forming, Place 1 drop into both eyes daily.    Reviewed prior external information including notes and imaging from  primary care provider As  well as notes that were available from care everywhere and other healthcare systems.  Past medical history, social, surgical and family history all reviewed in electronic medical record.  No pertanent information unless stated regarding to the chief complaint.   Review of Systems:  No headache, visual changes, nausea, vomiting, diarrhea, constipation, dizziness, abdominal pain, skin rash, fevers, chills, night sweats, weight loss, swollen lymph nodes, body aches, joint swelling, chest pain, shortness of breath, mood changes. POSITIVE muscle aches  Objective  Blood pressure 130/88, pulse 68, height _0  (1.727 m), weight 213 lb (96.6 kg), SpO2 98 %.   General: No apparent distress alert and oriented x3 mood and affect normal, dressed appropriately.  HEENT: Pupils equal, extraocular movements intact  Respiratory: Patient's speak in full sentences and does not appear short of breath  Cardiovascular: No lower extremity edema, non tender, no erythema  Gait overall normal MSK: Right knee exam shows patient does still have some tenderness to palpation over the medial joint line.  Patient does not have any significant gapping at this time though with stressing of the MCL.  Mild pain still with McMurray's test.  No crepitus noted.  Limited muscular skeletal ultrasound was performed and interpreted by Hulan Saas, M  Limited ultrasound shows the patient still has some mild  hypoechoic changes noted of the MCL.  It does appear though that patient does have some more scar tissue formation than previous exam which would be interval healing noted.  On dynamic testing do not see as much gapping noted today.  Continue abnormality noted of the medial meniscus. Impression: Interval healing but continued degenerative changes of the medial meniscus.    Impression and Recommendations:     The above documentation has been reviewed and is accurate and complete Lyndal Pulley, DO

## 2021-03-29 ENCOUNTER — Other Ambulatory Visit: Payer: Self-pay

## 2021-03-29 ENCOUNTER — Encounter: Payer: Self-pay | Admitting: Family Medicine

## 2021-03-29 ENCOUNTER — Ambulatory Visit (INDEPENDENT_AMBULATORY_CARE_PROVIDER_SITE_OTHER): Payer: 59 | Admitting: Family Medicine

## 2021-03-29 ENCOUNTER — Ambulatory Visit: Payer: Self-pay

## 2021-03-29 VITALS — BP 130/88 | HR 68 | Ht 68.0 in | Wt 213.0 lb

## 2021-03-29 DIAGNOSIS — M25561 Pain in right knee: Secondary | ICD-10-CM | POA: Diagnosis not present

## 2021-03-29 NOTE — Patient Instructions (Signed)
Great to see you  You continue to do better  We will get you another 2 month trial of full duty and see how you do  Another idea is if it acts up we will need an MRI since the knee injury  See me again in 8 weeks

## 2021-03-29 NOTE — Assessment & Plan Note (Addendum)
Patient does still have the medial meniscal injury and does still have the MCL but does seem to be improving.  Discussed with patient about icing regimen and home exercises.  Patient still wears the brace with walking.  Increase activity slowly.  Patient will do a trial full duty for 2 months.  Follow-up with me again 6 to 8 weeks worsening pain I did encourage patient get an MRI for this to be a work-related injury.

## 2021-04-26 ENCOUNTER — Other Ambulatory Visit: Payer: Self-pay

## 2021-04-26 ENCOUNTER — Encounter: Payer: Self-pay | Admitting: Endocrinology

## 2021-04-26 ENCOUNTER — Ambulatory Visit (INDEPENDENT_AMBULATORY_CARE_PROVIDER_SITE_OTHER): Payer: 59 | Admitting: Endocrinology

## 2021-04-26 VITALS — BP 140/98 | HR 70 | Ht 68.0 in | Wt 215.8 lb

## 2021-04-26 DIAGNOSIS — E119 Type 2 diabetes mellitus without complications: Secondary | ICD-10-CM

## 2021-04-26 LAB — POCT GLYCOSYLATED HEMOGLOBIN (HGB A1C): Hemoglobin A1C: 6.6 % — AB (ref 4.0–5.6)

## 2021-04-26 NOTE — Progress Notes (Signed)
? ?Subjective:  ? ? Patient ID: Jason Ortega, male    DOB: 08-Jul-1965, 56 y.o.   MRN: [id number] ? ?HPI ?Pt returns for f/u of diabetes mellitus:  ?DM type: 2 ?Dx'ed: 1999 ?Complications: PN ?Therapy: Ozempic and 2 oral meds.   ?DKA: never ?Severe hypoglycemia: never ?Pancreatitis: never.  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   ?Interval history: pt states he feels well in general.  He takes meds as rx'ed.  Pt states cbg's are well-controlled.   ?Past Medical History:  ?Diagnosis Date  ? Allergy   ? Anemia   ? Arthritis   ? Cataract   ? bilateral cataracts removed  ? Depression   ? Diabetes mellitus   ? Frequent headaches   ? GERD (gastroesophageal reflux disease)   ? Glaucoma   ? Hypercholesteremia   ? Hypertension   ? Migraines   ? Sleep apnea   ? wears c-PAP  ? ? ?Past Surgical History:  ?Procedure Laterality Date  ? bilateral cataracts removed    ? CARDIAC CATHETERIZATION  12/2011  ? COLONOSCOPY    ? LEFT HEART CATHETERIZATION WITH CORONARY ANGIOGRAM N/A 11/12/2011  ? Procedure: LEFT HEART CATHETERIZATION WITH CORONARY ANGIOGRAM;  Surgeon: Laverda Page, MD;  Location: [facility] CATH LAB;  Service: Cardiovascular;  Laterality: N/A;  ? SHOULDER SURGERY    ? ? ?Social History  ? ?Socioeconomic History  ? Marital status: Single  ?  Spouse name: Not on file  ? Number of children: 2  ? Years of education: 61  ? Highest education level: Not on file  ?Occupational History  ? Occupation: Clinical research associate  ?Tobacco Use  ? Smoking status: Never  ? Smokeless tobacco: Never  ?Vaping Use  ? Vaping Use: Never used  ?Substance and Sexual Activity  ? Alcohol use: Yes  ?  Comment: occasional  ? Drug use: No  ? Sexual activity: Yes  ?  Birth control/protection: None  ?Other Topics Concern  ? Not on file  ?Social History Narrative  ? Fun/Hobby: Coach football  ? ?Social Determinants of Health  ? ?Financial Resource Strain: Not on file  ?Food Insecurity: Not on  file  ?Transportation Needs: Not on file  ?Physical Activity: Not on file  ?Stress: Not on file  ?Social Connections: Not on file  ?Intimate Partner Violence: Not on file  ? ? ?Current Outpatient Medications on File Prior to Visit  ?Medication Sig Dispense Refill  ? aspirin EC 325 MG tablet Take 1 tablet (325 mg total) by mouth daily. 30 tablet 99  ? atorvastatin (LIPITOR) 40 MG tablet TAKE 1 TABLET BY MOUTH EVERY DAY 90 tablet 3  ? baclofen (LIORESAL) 10 MG tablet Take 1 tablet (10 mg total) by mouth 3 (three) times daily. Prn for IBS flare 30 each 0  ? Blood Glucose Monitoring Suppl (ONETOUCH VERIO) w/Device KIT 1 Device by Does not apply route 2 (two) times daily as needed. (Patient taking differently: 1 Device by Does not apply route 2 (two) times daily. E11.9) 1 kit 0  ? diclofenac sodium (VOLTAREN) 1 % GEL Apply 2 g topically 4 (four) times daily. 200 g 5  ? dicyclomine (BENTYL) 10 MG capsule TAKE 1 CAPSULE (10 MG TOTAL) BY MOUTH 4 (FOUR) TIMES DAILY - BEFORE MEALS AND AT BEDTIME. 90 capsule 3  ? FARXIGA 10 MG TABS tablet TAKE 1 TABLET BY MOUTH EVERY DAY 90 tablet 1  ? Galcanezumab-gnlm (EMGALITY) 120  MG/ML SOAJ Inject 120 mg into the skin every 30 (thirty) days. 1 mL 11  ? glucose blood (ONETOUCH VERIO) test strip 1 each by Other route 2 (two) times daily. E11.9    ? ibuprofen (ADVIL,MOTRIN) 600 MG tablet TAKE 1 TABLET(600 MG) BY MOUTH EVERY 8 HOURS AS NEEDED 30 tablet 0  ? losartan-hydrochlorothiazide (HYZAAR) 100-25 MG tablet Take 1 tablet by mouth daily. 90 tablet 3  ? meloxicam (MOBIC) 15 MG tablet Take 1 tablet (15 mg total) by mouth daily. 30 tablet 5  ? metFORMIN (GLUCOPHAGE-XR) 500 MG 24 hr tablet TAKE 4 TABLETS BY MOUTH DAILY WITH BREAKFAST 360 tablet 1  ? methocarbamol (ROBAXIN) 500 MG tablet TAKE 1 TABLET BY MOUTH TWICE DAILY (Patient taking differently: Take 500 mg by mouth every 6 (six) hours as needed.) 20 tablet 0  ? omeprazole (PRILOSEC) 40 MG capsule TAKE 1 CAPSULE IN THE [facility] AND AT  BEDTIME 180 capsule 1  ? OneTouch Delica Lancets 67M MISC 1 each by Does not apply route 2 (two) times daily. E11.9    ? PENNSAID 2 % SOLN SMARTSIG:2 Pump Topical Twice Daily    ? propranolol (INDERAL) 20 MG tablet Take 1 tablet (20 mg total) by mouth at bedtime. 90 tablet 3  ? rizatriptan (MAXALT-MLT) 10 MG disintegrating tablet Take 1 tablet (10 mg total) by mouth as needed for migraine. May repeat in 2 hours if needed 9 tablet 11  ? Semaglutide, 2 MG/DOSE, (OZEMPIC, 2 MG/DOSE,) 8 MG/3ML SOPN Inject 2 mg into the skin once a week. 9 mL 3  ? sildenafil (REVATIO) 20 MG tablet Take 20 mg by mouth. 3-5 tabs prn    ? timolol (TIMOPTIC-XR) 0.5 % ophthalmic gel-forming Place 1 drop into both eyes daily.     ? verapamil (CALAN-SR) 180 MG CR tablet Take 1 tablet (180 mg total) by mouth daily. 90 tablet 3  ? ?No current facility-administered medications on file prior to visit.  ? ? ?Allergies  ?Allergen Reactions  ? Pollen Extract Other (See Comments)  ?  Stuffy nose  ? ? ?Family History  ?Problem Relation Age of Onset  ? Arthritis Mother   ? Stroke Mother   ? Hypertension Father   ? Diabetes Father   ? Cancer Father   ?     Multiple Myleoma  ? Diabetes Maternal Grandfather   ? Colon cancer Neg Hx   ? Esophageal cancer Neg Hx   ? Rectal cancer Neg Hx   ? Stomach cancer Neg Hx   ? ? ?BP (!) 140/98 (BP Location: Left Arm, Patient Position: Sitting, Cuff Size: Normal)   Pulse 70   Ht $R'5\' 8"'Xa$  (1.727 m)   Wt 215 lb 12.8 oz (97.9 kg)   SpO2 98%   BMI 32.81 kg/m?  ? ? ?Review of Systems ?Denies N/V/HB.   ?   ?Objective:  ? Physical Exam ?VITAL SIGNS:  See vs page.   ?GENERAL: no distress.   ?EXT: no leg edema.   ? ?Lab Results  ?Component Value Date  ? TSH 0.81 02/27/2021  ? ? ?Lab Results  ?Component Value Date  ? CREATININE 1.04 02/27/2021  ? BUN 14 02/27/2021  ? NA 140 02/27/2021  ? K 3.3 (L) 02/27/2021  ? CL 100 02/27/2021  ? CO2 31 02/27/2021  ? ? ?A1c=6.6% ?   ?Assessment & Plan:  ?Type 2 DM: well-controlled ? ?Patient  Instructions  ?Your blood pressure is high today.  Please see your primary care provider soon,  to have it rechecked.   ?continue the same 3 diabetes medications.   ?check your blood sugar twice a day.  vary the time of day when you check, between before the 3 meals, and at bedtime.  also check if you have symptoms of your blood sugar being too high or too low.  please keep a record of the readings and bring it to your next appointment here (or you can bring the meter itself).  You can write it on any piece of paper.  please call us sooner if your blood sugar goes below 70, or if you have a lot of readings over 200.   ?Please come back for a follow-up appointment in 6 months.    ? ? ?

## 2021-04-26 NOTE — Patient Instructions (Addendum)
Your blood pressure is high today.  Please see your primary care provider soon, to have it rechecked.   ?continue the same 3 diabetes medications.   ?check your blood sugar twice a day.  vary the time of day when you check, between before the 3 meals, and at bedtime.  also check if you have symptoms of your blood sugar being too high or too low.  please keep a record of the readings and bring it to your next appointment here (or you can bring the meter itself).  You can write it on any piece of paper.  please call us sooner if your blood sugar goes below 70, or if you have a lot of readings over 200.   ?Please come back for a follow-up appointment in 6 months.    ?

## 2021-04-28 ENCOUNTER — Telehealth: Payer: Self-pay | Admitting: Neurology

## 2021-04-28 ENCOUNTER — Other Ambulatory Visit: Payer: Self-pay | Admitting: Neurology

## 2021-04-28 MED ORDER — RIZATRIPTAN BENZOATE 10 MG PO TBDP: 10.0000 mg | ORAL_TABLET | ORAL | 11 refills | Status: DC | PRN

## 2021-04-28 NOTE — Telephone Encounter (Signed)
Patient called the answering service asking for refill for his migraines medications. Rizatriptan sent to the pharmacy.  ? ?Jason Norfolk, MD  ?

## 2021-05-02 ENCOUNTER — Encounter: Payer: Self-pay | Admitting: Neurology

## 2021-05-02 ENCOUNTER — Other Ambulatory Visit: Payer: Self-pay | Admitting: Gastroenterology

## 2021-05-02 ENCOUNTER — Other Ambulatory Visit: Payer: Self-pay | Admitting: Endocrinology

## 2021-05-02 MED ORDER — NURTEC 75 MG PO TBDP: 1.0000 | ORAL_TABLET | ORAL | 0 refills | Status: DC | PRN

## 2021-05-02 NOTE — Telephone Encounter (Signed)
PA completed on [facility] ?KEY: [id number] ?No additional PA required is what kicked back.  ?

## 2021-05-02 NOTE — Telephone Encounter (Signed)
At 12:08 pt left a vm stating he has had a migraine for a couple of days and the medication isn't working,  Pt asking to be called at [phone] ?

## 2021-05-02 NOTE — Telephone Encounter (Signed)
Called the pt back. He has continued to struggle with migraine and the rizatriptan is no longer helping. He is due for the emgality shot and is planning to pick that up from the pharmacy but was interested in if there is something else that he can try for acute management. Pt has tried/failed sumatriptan and rizatriptan. He has not tried [place] or nurtec. Advised I would inform Amy about this and see what her recommendation would be. Pharmacy on file is correct and advised I would message him on mychart with her recommendation. Pt verbalized understanding. ? ?

## 2021-05-02 NOTE — Addendum Note (Signed)
Addended by: Judi Cong on: 05/02/2021 01:45 PM ? ? Modules accepted: Orders ? ?

## 2021-05-23 ENCOUNTER — Other Ambulatory Visit: Payer: Self-pay | Admitting: Internal Medicine

## 2021-05-23 NOTE — Progress Notes (Signed)
?Charlann Boxer D.O. ?[facility] ?[place] ?Phone: [phone] ?Subjective:   ?I, Jason Ortega, am serving as a scribe for Dr. Hulan Saas. ?This visit occurred during the SARS-CoV-2 public health emergency.  Safety protocols were in place, including screening questions prior to the visit, additional usage of staff PPE, and extensive cleaning of exam room while observing appropriate contact time as indicated for disinfecting solutions.  ? ?I'm seeing this patient by the request  of:  Biagio Borg, MD ? ?CC: right knee pain  ? ?[id number]  ?03/30/2021 ?Patient does still have the medial meniscal injury and does still have the MCL but does seem to be improving.  Discussed with patient about icing regimen and home exercises.  Patient still wears the brace with walking.  Increase activity slowly.  Patient will do a trial full duty for 2 months.  Follow-up with me again 6 to 8 weeks worsening pain I did encourage patient get an MRI for this to be a work-related injury.  ? ?Update 05/25/2021 ?Jason Ortega is a 56 y.o. male coming in with complaint of R knee pain. Patient states that his knee pain has improved. C/o pain in B elbows behind the medial epicondyle. L>R. Using elbow brace. Unsure of specific motions that bother elbows. Pain started 1 week ago.  ? ? ?  ? ?Past Medical History:  ?Diagnosis Date  ? Allergy   ? Anemia   ? Arthritis   ? Cataract   ? bilateral cataracts removed  ? Depression   ? Diabetes mellitus   ? Frequent headaches   ? GERD (gastroesophageal reflux disease)   ? Glaucoma   ? Hypercholesteremia   ? Hypertension   ? Migraines   ? Sleep apnea   ? wears c-PAP  ? ?Past Surgical History:  ?Procedure Laterality Date  ? bilateral cataracts removed    ? CARDIAC CATHETERIZATION  12/2011  ? COLONOSCOPY    ? LEFT HEART CATHETERIZATION WITH CORONARY ANGIOGRAM N/A 11/12/2011  ? Procedure: LEFT HEART CATHETERIZATION WITH CORONARY ANGIOGRAM;  Surgeon: Laverda Page, MD;  Location: [facility] CATH LAB;  Service: Cardiovascular;  Laterality: N/A;  ? SHOULDER SURGERY    ? ?Social History  ? ?Socioeconomic History  ? Marital status: Single  ?  Spouse name: Not on file  ? Number of children: 2  ? Years of education: 53  ? Highest education level: Not on file  ?Occupational History  ? Occupation: Clinical research associate  ?Tobacco Use  ? Smoking status: Never  ? Smokeless tobacco: Never  ?Vaping Use  ? Vaping Use: Never used  ?Substance and Sexual Activity  ? Alcohol use: Yes  ?  Comment: occasional  ? Drug use: No  ? Sexual activity: Yes  ?  Birth control/protection: None  ?Other Topics Concern  ? Not on file  ?Social History Narrative  ? Fun/Hobby: Coach football  ? ?Social Determinants of Health  ? ?Financial Resource Strain: Not on file  ?Food Insecurity: Not on file  ?Transportation Needs: Not on file  ?Physical Activity: Not on file  ?Stress: Not on file  ?Social Connections: Not on file  ? ?Allergies  ?Allergen Reactions  ? Pollen Extract Other (See Comments)  ?  Stuffy nose  ? ?Family History  ?Problem Relation Age of Onset  ? Arthritis Mother   ? Stroke Mother   ? Hypertension Father   ? Diabetes Father   ? Cancer Father   ?     Multiple  Myleoma  ? Diabetes Maternal Grandfather   ? Colon cancer Neg Hx   ? Esophageal cancer Neg Hx   ? Rectal cancer Neg Hx   ? Stomach cancer Neg Hx   ? ? ?Current Outpatient Medications (Endocrine & Metabolic):  ?  FARXIGA 10 MG TABS tablet, TAKE 1 TABLET BY MOUTH EVERY DAY ?  metFORMIN (GLUCOPHAGE-XR) 500 MG 24 hr tablet, TAKE 4 TABLETS BY MOUTH DAILY WITH BREAKFAST ?  Semaglutide, 2 MG/DOSE, (OZEMPIC, 2 MG/DOSE,) 8 MG/3ML SOPN, Inject 2 mg into the skin once a week. ? ?Current Outpatient Medications (Cardiovascular):  ?  atorvastatin (LIPITOR) 40 MG tablet, TAKE 1 TABLET BY MOUTH EVERY DAY ?  losartan-hydrochlorothiazide (HYZAAR) 100-25 MG tablet, TAKE 1 TABLET BY MOUTH EVERY DAY ?  propranolol (INDERAL) 20 MG tablet, Take 1 tablet (20 mg total) by mouth at  bedtime. ?  sildenafil (REVATIO) 20 MG tablet, Take 20 mg by mouth. 3-5 tabs prn ?  verapamil (CALAN-SR) 180 MG CR tablet, Take 1 tablet (180 mg total) by mouth daily. ? ? ?Current Outpatient Medications (Analgesics):  ?  aspirin EC 325 MG tablet, Take 1 tablet (325 mg total) by mouth daily. ?  Galcanezumab-gnlm (EMGALITY) 120 MG/ML SOAJ, Inject 120 mg into the skin every 30 (thirty) days. ?  ibuprofen (ADVIL,MOTRIN) 600 MG tablet, TAKE 1 TABLET(600 MG) BY MOUTH EVERY 8 HOURS AS NEEDED ?  meloxicam (MOBIC) 15 MG tablet, Take 1 tablet (15 mg total) by mouth daily. ?  Rimegepant Sulfate (NURTEC) 75 MG TBDP, Take 1 tablet by mouth as needed (Take 1 tablet at onset of migraine ( Do not exceed more than 1 tablet in 24 hours)). ?  rizatriptan (MAXALT-MLT) 10 MG disintegrating tablet, Take 1 tablet (10 mg total) by mouth as needed for migraine. May repeat in 2 hours if needed ? ? ?Current Outpatient Medications (Other):  ?  baclofen (LIORESAL) 10 MG tablet, Take 1 tablet (10 mg total) by mouth 3 (three) times daily. Prn for IBS flare ?  Blood Glucose Monitoring Suppl (ONETOUCH VERIO) w/Device KIT, 1 Device by Does not apply route 2 (two) times daily as needed. (Patient taking differently: 1 Device by Does not apply route 2 (two) times daily. E11.9) ?  diclofenac sodium (VOLTAREN) 1 % GEL, Apply 2 g topically 4 (four) times daily. ?  dicyclomine (BENTYL) 10 MG capsule, TAKE 1 CAPSULE (10 MG TOTAL) BY MOUTH 4 (FOUR) TIMES DAILY - BEFORE MEALS AND AT BEDTIME. ?  gabapentin (NEURONTIN) 100 MG capsule, Take 2 capsules (200 mg total) by mouth at bedtime. ?  glucose blood (ONETOUCH VERIO) test strip, 1 each by Other route 2 (two) times daily. E11.9 ?  methocarbamol (ROBAXIN) 500 MG tablet, TAKE 1 TABLET BY MOUTH TWICE DAILY (Patient taking differently: Take 500 mg by mouth every 6 (six) hours as needed.) ?  omeprazole (PRILOSEC) 40 MG capsule, TAKE 1 CAPSULE IN THE [facility] AND AT BEDTIME ?  OneTouch Delica Lancets 33A MISC, 1 each  by Does not apply route 2 (two) times daily. E11.9 ?  PENNSAID 2 % SOLN, SMARTSIG:2 Pump Topical Twice Daily ?  timolol (TIMOPTIC-XR) 0.5 % ophthalmic gel-forming, Place 1 drop into both eyes daily.  ? ? ?Reviewed prior external information including notes and imaging from  ?primary care provider ?As well as notes that were available from care everywhere and other healthcare systems. ? ?Past medical history, social, surgical and family history all reviewed in electronic medical record.  No pertanent information unless stated regarding  to the chief complaint.  ? ?Review of Systems: ? No headache, visual changes, nausea, vomiting, diarrhea, constipation, dizziness, abdominal pain, skin rash, fevers, chills, night sweats, weight loss, swollen lymph nodes, body aches, joint swelling, chest pain, shortness of breath, mood changes. POSITIVE muscle aches ? ?Objective  ?Blood pressure 140/90, pulse 67, height $RemoveBe'5\' 8"'QdFTJlkJE$  (1.727 m), weight 215 lb (97.5 kg), SpO2 98 %. ?  ?General: No apparent distress alert and oriented x3 mood and affect normal, dressed appropriately.  ?HEENT: Pupils equal, extraocular movements intact  ?Respiratory: Patient's speak in full sentences and does not appear short of breath  ?Cardiovascular: No lower extremity edema, non tender, no erythema  ?Gait normal with good balance and coordination.  ?MSK: Right knee exam has no significant swelling.  Nontender on exam today. ?Left elbow tender to palpation over the medial epicondylar region.  Positive Tinel's sign in this area.  Patient has pain in the ulnar groove.  Good grip strength noted. ? ?Limited muscular skeletal ultrasound was performed and interpreted by Hulan Saas, M  ?Limited ultrasound of patient's ulnar nerve show hypoechoic changes and does show subluxation noted with dynamic testing.  Mild dilatation noted ?Impression: Consistent with ulnar subluxation ? ?  ?Impression and Recommendations:  ?  ? ?The above documentation has been reviewed  and is accurate and complete Lyndal Pulley, DO ? ? ? ?

## 2021-05-23 NOTE — Telephone Encounter (Signed)
Please refill as per office routine med refill policy (all routine meds to be refilled for 3 mo or monthly (per pt preference) up to one year from last visit, then month to month grace period for 3 mo, then further med refills will have to be denied) ? ?

## 2021-05-25 ENCOUNTER — Ambulatory Visit (INDEPENDENT_AMBULATORY_CARE_PROVIDER_SITE_OTHER): Payer: 59 | Admitting: Family Medicine

## 2021-05-25 ENCOUNTER — Ambulatory Visit: Payer: Self-pay

## 2021-05-25 ENCOUNTER — Encounter: Payer: Self-pay | Admitting: Family Medicine

## 2021-05-25 VITALS — BP 140/90 | HR 67 | Ht 68.0 in | Wt 215.0 lb

## 2021-05-25 DIAGNOSIS — G5622 Lesion of ulnar nerve, left upper limb: Secondary | ICD-10-CM | POA: Diagnosis not present

## 2021-05-25 DIAGNOSIS — M25561 Pain in right knee: Secondary | ICD-10-CM | POA: Diagnosis not present

## 2021-05-25 MED ORDER — GABAPENTIN 100 MG PO CAPS: 200.0000 mg | ORAL_CAPSULE | Freq: Every day | ORAL | 0 refills | Status: DC

## 2021-05-25 NOTE — Assessment & Plan Note (Signed)
Seems to be at the elbow.  Patient does do a lot of repetitive motion.  Discussed home exercises, compression and padding, avoiding full extension when possible.  We discussed gabapentin and prescribed today.  Increase activity slowly.  Follow-up again in 4 to 6 weeks.  Worsening pain consider formal physical therapy ?

## 2021-05-25 NOTE — Patient Instructions (Signed)
Elbow compression with padding ?Limit full extension  ?Gabapentin 200mg  ?See me in 6-8 weeks ?

## 2021-05-25 NOTE — Assessment & Plan Note (Signed)
Significant improvement.  Back to patient's baseline. ?

## 2021-06-22 ENCOUNTER — Telehealth: Payer: Self-pay | Admitting: Family Medicine

## 2021-06-22 MED ORDER — NURTEC 75 MG PO TBDP: 1.0000 | ORAL_TABLET | ORAL | 0 refills | Status: DC | PRN

## 2021-06-22 NOTE — Telephone Encounter (Signed)
Pt is requesting a refill for Rimegepant Sulfate (NURTEC) 75 MG TBDP.  [phone]

## 2021-06-22 NOTE — Addendum Note (Signed)
Addended by: Wyvonnia Lora on: 06/22/2021 03:36 PM   Modules accepted: Orders

## 2021-06-22 NOTE — Telephone Encounter (Signed)
E-scribed refill 

## 2021-06-27 ENCOUNTER — Other Ambulatory Visit: Payer: Self-pay | Admitting: Family Medicine

## 2021-07-12 ENCOUNTER — Encounter: Payer: Self-pay | Admitting: Hematology and Oncology

## 2021-07-13 ENCOUNTER — Encounter: Payer: Self-pay | Admitting: Hematology and Oncology

## 2021-07-18 NOTE — Progress Notes (Signed)
[facility] [place] Phone: [phone] Subjective:   [name], am serving as a scribe for Dr. Hulan Saas.  I'm seeing this patient by the request  of:  Biagio Borg, MD  CC: Right knee, left elbow pain follow-up  [id number]  05/25/2021 Seems to be at the elbow.  Patient does do a lot of repetitive motion.  Discussed home exercises, compression and padding, avoiding full extension when possible.  We discussed gabapentin and prescribed today.  Increase activity slowly.  Follow-up again in 4 to 6 weeks.  Worsening pain consider formal physical therapy  Update 07/20/2021 Jason Ortega is a 56 y.o. male coming in with complaint of R knee and L elbow pain. Patient states that his elbow pain is the same as last visit. Wearing compression for pain relief.   Right knee has been doing ok as well. Yesterday felt dull achy pain with stepping up. Wears knee brace with work.       Past Medical History:  Diagnosis Date   Allergy    Anemia    Arthritis    Cataract    bilateral cataracts removed   Depression    Diabetes mellitus    Frequent headaches    GERD (gastroesophageal reflux disease)    Glaucoma    Hypercholesteremia    Hypertension    Migraines    Sleep apnea    wears c-PAP   Past Surgical History:  Procedure Laterality Date   bilateral cataracts removed     CARDIAC CATHETERIZATION  12/2011   COLONOSCOPY     LEFT HEART CATHETERIZATION WITH CORONARY ANGIOGRAM N/A 11/12/2011   Procedure: LEFT HEART CATHETERIZATION WITH CORONARY ANGIOGRAM;  Surgeon: Laverda Page, MD;  Location: [facility] CATH LAB;  Service: Cardiovascular;  Laterality: N/A;   SHOULDER SURGERY     Social History   Socioeconomic History   Marital status: Single    Spouse name: Not on file   Number of children: 2   Years of education: 49   Highest education level: Not on file  Occupational History   Occupation: Clinical research associate  Tobacco Use    Smoking status: Never   Smokeless tobacco: Never  Vaping Use   Vaping Use: Never used  Substance and Sexual Activity   Alcohol use: Yes    Comment: occasional   Drug use: No   Sexual activity: Yes    Birth control/protection: None  Other Topics Concern   Not on file  Social History Narrative   Fun/Hobby: Leisure centre manager football   Social Determinants of Health   Financial Resource Strain: Not on file  Food Insecurity: Not on file  Transportation Needs: Not on file  Physical Activity: Not on file  Stress: Not on file  Social Connections: Not on file   Allergies  Allergen Reactions   Pollen Extract Other (See Comments)    Stuffy nose   Family History  Problem Relation Age of Onset   Arthritis Mother    Stroke Mother    Hypertension Father    Diabetes Father    Cancer Father        Multiple Myleoma   Diabetes Maternal Grandfather    Colon cancer Neg Hx    Esophageal cancer Neg Hx    Rectal cancer Neg Hx    Stomach cancer Neg Hx     Current Outpatient Medications (Endocrine & Metabolic):    FARXIGA 10 MG TABS tablet, TAKE 1 TABLET BY MOUTH EVERY  DAY   metFORMIN (GLUCOPHAGE-XR) 500 MG 24 hr tablet, TAKE 4 TABLETS BY MOUTH DAILY WITH BREAKFAST   Semaglutide, 2 MG/DOSE, (OZEMPIC, 2 MG/DOSE,) 8 MG/3ML SOPN, Inject 2 mg into the skin once a week.  Current Outpatient Medications (Cardiovascular):    atorvastatin (LIPITOR) 40 MG tablet, TAKE 1 TABLET BY MOUTH EVERY DAY   losartan-hydrochlorothiazide (HYZAAR) 100-25 MG tablet, TAKE 1 TABLET BY MOUTH EVERY DAY   propranolol (INDERAL) 20 MG tablet, Take 1 tablet (20 mg total) by mouth at bedtime.   sildenafil (REVATIO) 20 MG tablet, Take 20 mg by mouth. 3-5 tabs prn   verapamil (CALAN-SR) 180 MG CR tablet, Take 1 tablet (180 mg total) by mouth daily.   Current Outpatient Medications (Analgesics):    aspirin EC 325 MG tablet, Take 1 tablet (325 mg total) by mouth daily.   Galcanezumab-gnlm (EMGALITY) 120 MG/ML SOAJ, Inject 120 mg  into the skin every 30 (thirty) days.   ibuprofen (ADVIL,MOTRIN) 600 MG tablet, TAKE 1 TABLET(600 MG) BY MOUTH EVERY 8 HOURS AS NEEDED   meloxicam (MOBIC) 15 MG tablet, TAKE 1 TABLET (15 MG TOTAL) BY MOUTH DAILY.   Rimegepant Sulfate (NURTEC) 75 MG TBDP, Take 1 tablet by mouth as needed (Take 1 tablet at onset of migraine ( Do not exceed more than 1 tablet in 24 hours)).   rizatriptan (MAXALT-MLT) 10 MG disintegrating tablet, Take 1 tablet (10 mg total) by mouth as needed for migraine. May repeat in 2 hours if needed   Current Outpatient Medications (Other):    baclofen (LIORESAL) 10 MG tablet, Take 1 tablet (10 mg total) by mouth 3 (three) times daily. Prn for IBS flare   Blood Glucose Monitoring Suppl (ONETOUCH VERIO) w/Device KIT, 1 Device by Does not apply route 2 (two) times daily as needed. (Patient taking differently: 1 Device by Does not apply route 2 (two) times daily. E11.9)   diclofenac sodium (VOLTAREN) 1 % GEL, Apply 2 g topically 4 (four) times daily.   dicyclomine (BENTYL) 10 MG capsule, TAKE 1 CAPSULE (10 MG TOTAL) BY MOUTH 4 (FOUR) TIMES DAILY - BEFORE MEALS AND AT BEDTIME.   gabapentin (NEURONTIN) 300 MG capsule, Take 1 capsule (300 mg total) by mouth at bedtime.   glucose blood (ONETOUCH VERIO) test strip, 1 each by Other route 2 (two) times daily. E11.9   methocarbamol (ROBAXIN) 500 MG tablet, TAKE 1 TABLET BY MOUTH TWICE DAILY (Patient taking differently: Take 500 mg by mouth every 6 (six) hours as needed.)   omeprazole (PRILOSEC) 40 MG capsule, TAKE 1 CAPSULE IN THE [facility] AND AT BEDTIME   OneTouch Delica Lancets 11H MISC, 1 each by Does not apply route 2 (two) times daily. E11.9   PENNSAID 2 % SOLN, SMARTSIG:2 Pump Topical Twice Daily   timolol (TIMOPTIC-XR) 0.5 % ophthalmic gel-forming, Place 1 drop into both eyes daily.    Reviewed prior external information including notes and imaging from  primary care provider As well as notes that were available from care  everywhere and other healthcare systems.  Past medical history, social, surgical and family history all reviewed in electronic medical record.  No pertanent information unless stated regarding to the chief complaint.   Review of Systems:  No headache, visual changes, nausea, vomiting, diarrhea, constipation, dizziness, abdominal pain, skin rash, fevers, chills, night sweats, weight loss, swollen lymph nodes, body aches, joint swelling, chest pain, shortness of breath, mood changes. POSITIVE muscle aches  Objective  Blood pressure (!) 130/92, pulse 73, height $RemoveBe'5\' 8"'viYUSpkmD$  (  1.727 m), SpO2 98 %.   General: No apparent distress alert and oriented x3 mood and affect normal, dressed appropriately.  HEENT: Pupils equal, extraocular movements intact  Respiratory: Patient's speak in full sentences and does not appear short of breath  Cardiovascular: No lower extremity edema, non tender, no erythema  Patient's right knee does have some tenderness to palpation still over the patellofemoral joint.  Mild lateral tracking noted. Left elbow still has tenderness with a positive Tinel's over the medial epicondylar region.  Patient does have full range of motion with flexion extension as well as supination and pronation.  Good grip strength noted.  Limited muscular skeletal ultrasound was performed and interpreted by Hulan Saas, M  Limited ultrasound shows the patient's patellofemoral does have some chondromalacia still noted mostly of the superior lateral aspect.  Patient does have what appears to be possibly a resolving effusion of the knee narrowing of the patellofemoral space is noted.  Right meniscus still has the chronic tearing noted. 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

## 2021-07-20 ENCOUNTER — Ambulatory Visit (INDEPENDENT_AMBULATORY_CARE_PROVIDER_SITE_OTHER): Payer: 59 | Admitting: Family Medicine

## 2021-07-20 ENCOUNTER — Encounter: Payer: Self-pay | Admitting: Family Medicine

## 2021-07-20 ENCOUNTER — Ambulatory Visit: Payer: Self-pay

## 2021-07-20 VITALS — BP 130/92 | HR 73 | Ht 68.0 in

## 2021-07-20 DIAGNOSIS — M94261 Chondromalacia, right knee: Secondary | ICD-10-CM | POA: Diagnosis not present

## 2021-07-20 DIAGNOSIS — G5622 Lesion of ulnar nerve, left upper limb: Secondary | ICD-10-CM

## 2021-07-20 DIAGNOSIS — M25522 Pain in left elbow: Secondary | ICD-10-CM | POA: Diagnosis not present

## 2021-07-20 MED ORDER — GABAPENTIN 300 MG PO CAPS: 300.0000 mg | ORAL_CAPSULE | Freq: Every day | ORAL | 0 refills | Status: DC

## 2021-07-20 NOTE — Assessment & Plan Note (Signed)
Discussed with patient again.  Patient states that overall it does seem to be improving slowly.  Continue the compression.  Discussed the icing regimen.  Discussed potential injections with patient declined.  Not having any radiation to the hand anymore and no weakness.

## 2021-07-20 NOTE — Patient Instructions (Signed)
Gabapentin 300mg  Keep doing everything Can consider sound wave for knee See me again in 2 months

## 2021-07-20 NOTE — Assessment & Plan Note (Signed)
Overall evaluation below  Processing regimen this.  Patient is able to continue to work well.  Does seem to have some mild lateral tracking that likely contributed to the exacerbation.  Will increase gabapentin to 300 mg to see if this will help with the pain and this as well as the ulnar subluxation follow-up again in 2 to 3 months

## 2021-07-31 ENCOUNTER — Telehealth: Payer: Self-pay | Admitting: Family Medicine

## 2021-07-31 MED ORDER — EMGALITY 120 MG/ML ~~LOC~~ SOAJ: 120.0000 mg | SUBCUTANEOUS | 5 refills | Status: DC

## 2021-08-05 ENCOUNTER — Other Ambulatory Visit: Payer: Self-pay | Admitting: Internal Medicine

## 2021-08-23 ENCOUNTER — Other Ambulatory Visit: Payer: Self-pay | Admitting: Gastroenterology

## 2021-08-25 ENCOUNTER — Telehealth: Payer: Self-pay | Admitting: Internal Medicine

## 2021-08-25 DIAGNOSIS — I10 Essential (primary) hypertension: Secondary | ICD-10-CM

## 2021-08-25 MED ORDER — VERAPAMIL HCL ER 180 MG PO TBCR: 180.0000 mg | EXTENDED_RELEASE_TABLET | Freq: Every day | ORAL | 1 refills | Status: DC

## 2021-08-25 NOTE — Telephone Encounter (Signed)
Sent refill to [facility].../lmb 

## 2021-08-25 NOTE — Telephone Encounter (Signed)
Caller & Relationship to patient: Jason Ortega  Call back number: [phone]  Date of last office visit: 02/27/21  Date of next office visit: 03/01/22  Medication(s) to be refilled: verapamil (CALAN-SR) 180 MG CR tablet   Preferred Pharmacy:  [id number] - [place], [place] - [address] AT [facility] [address] [facility] [address] Phone:  [phone]  Fax:  [phone]

## 2021-08-29 ENCOUNTER — Other Ambulatory Visit: Payer: Self-pay

## 2021-08-29 DIAGNOSIS — E119 Type 2 diabetes mellitus without complications: Secondary | ICD-10-CM

## 2021-08-29 MED ORDER — DAPAGLIFLOZIN PROPANEDIOL 10 MG PO TABS: 10.0000 mg | ORAL_TABLET | Freq: Every day | ORAL | 1 refills | Status: DC

## 2021-08-30 ENCOUNTER — Telehealth: Payer: Self-pay | Admitting: Gastroenterology

## 2021-08-30 ENCOUNTER — Other Ambulatory Visit: Payer: Self-pay | Admitting: Gastroenterology

## 2021-08-30 NOTE — Telephone Encounter (Signed)
Inbound call from patient stating he needs a refill for omeprazole. Please advise.

## 2021-08-31 NOTE — Telephone Encounter (Signed)
Omeprazole refilled and note attached to please call for appointment.

## 2021-09-07 ENCOUNTER — Ambulatory Visit: Payer: 59 | Admitting: Internal Medicine

## 2021-09-08 ENCOUNTER — Ambulatory Visit: Payer: 59 | Admitting: Internal Medicine

## 2021-09-08 ENCOUNTER — Encounter: Payer: Self-pay | Admitting: Internal Medicine

## 2021-09-08 VITALS — BP 142/84 | HR 68 | Temp 98.0°F | Ht 68.0 in | Wt 204.0 lb

## 2021-09-08 DIAGNOSIS — E119 Type 2 diabetes mellitus without complications: Secondary | ICD-10-CM

## 2021-09-08 DIAGNOSIS — Z8371 Family history of colonic polyps: Secondary | ICD-10-CM | POA: Insufficient documentation

## 2021-09-08 DIAGNOSIS — R195 Other fecal abnormalities: Secondary | ICD-10-CM | POA: Insufficient documentation

## 2021-09-08 DIAGNOSIS — E559 Vitamin D deficiency, unspecified: Secondary | ICD-10-CM

## 2021-09-08 DIAGNOSIS — Z23 Encounter for immunization: Secondary | ICD-10-CM | POA: Diagnosis not present

## 2021-09-08 DIAGNOSIS — R194 Change in bowel habit: Secondary | ICD-10-CM | POA: Insufficient documentation

## 2021-09-08 DIAGNOSIS — R051 Acute cough: Secondary | ICD-10-CM

## 2021-09-08 DIAGNOSIS — E785 Hyperlipidemia, unspecified: Secondary | ICD-10-CM

## 2021-09-08 DIAGNOSIS — I10 Essential (primary) hypertension: Secondary | ICD-10-CM

## 2021-09-08 DIAGNOSIS — K5730 Diverticulosis of large intestine without perforation or abscess without bleeding: Secondary | ICD-10-CM | POA: Insufficient documentation

## 2021-09-08 MED ORDER — VERAPAMIL HCL ER 240 MG PO TBCR: 240.0000 mg | EXTENDED_RELEASE_TABLET | Freq: Every day | ORAL | 3 refills | Status: DC

## 2021-09-08 MED ORDER — AZITHROMYCIN 250 MG PO TABS: ORAL_TABLET | ORAL | 1 refills | Status: AC

## 2021-09-08 NOTE — Progress Notes (Signed)
Patient ID: Jason Ortega, male   DOB: 31-Jul-1965, 56 y.o.   MRN: [id number]        Chief Complaint: follow up acute cough, htn       HPI:  Jason Ortega is a 56 y.o. male Here with acute onset mild to mod 2-3 days ST, HA, general weakness and malaise, with prod cough greenish sputum, but Pt denies chest pain, increased sob or doe, wheezing, orthopnea, PND, increased LE swelling, palpitations, dizziness or syncope.  Covid neg here in the office today.  Also SBP has been more labile recenly with 130's to 150s.  Lost wt with start ozempic.   Pt denies polydipsia, polyuria, or new focal neuro s/s.    Pt denies recent wt loss, night sweats, loss of appetite, or other constitutional symptoms  also fro shingrix #1 today Wt Readings from Last 3 Encounters:  09/08/21 204 lb (92.5 kg)  05/25/21 215 lb (97.5 kg)  04/26/21 215 lb 12.8 oz (97.9 kg)   BP Readings from Last 3 Encounters:  09/08/21 (!) 142/84  07/20/21 (!) 130/92  05/25/21 140/90         Past Medical History:  Diagnosis Date   Allergy    Anemia    Arthritis    Cataract    bilateral cataracts removed   Depression    Diabetes mellitus    Frequent headaches    GERD (gastroesophageal reflux disease)    Glaucoma    Hypercholesteremia    Hypertension    Migraines    Sleep apnea    wears c-PAP   Past Surgical History:  Procedure Laterality Date   bilateral cataracts removed     CARDIAC CATHETERIZATION  12/2011   COLONOSCOPY     LEFT HEART CATHETERIZATION WITH CORONARY ANGIOGRAM N/A 11/12/2011   Procedure: LEFT HEART CATHETERIZATION WITH CORONARY ANGIOGRAM;  Surgeon: Laverda Page, MD;  Location: [facility] CATH LAB;  Service: Cardiovascular;  Laterality: N/A;   SHOULDER SURGERY      reports that he has never smoked. He has never used smokeless tobacco. He reports current alcohol use. He reports that he does not use drugs. family history includes Arthritis in his mother; Cancer in his father; Diabetes in his father and maternal  grandfather; Hypertension in his father; Stroke in his mother. Allergies  Allergen Reactions   Pollen Extract Other (See Comments)    Stuffy nose   Current Outpatient Medications on File Prior to Visit  Medication Sig Dispense Refill   aspirin EC 325 MG tablet Take 1 tablet (325 mg total) by mouth daily. 30 tablet 99   atorvastatin (LIPITOR) 40 MG tablet TAKE 1 TABLET BY MOUTH EVERY DAY 90 tablet 3   baclofen (LIORESAL) 10 MG tablet Take 1 tablet (10 mg total) by mouth 3 (three) times daily. Prn for IBS flare 30 each 0   Blood Glucose Monitoring Suppl (ONETOUCH VERIO) w/Device KIT 1 Device by Does not apply route 2 (two) times daily as needed. (Patient taking differently: 1 Device by Does not apply route 2 (two) times daily. E11.9) 1 kit 0   dapagliflozin propanediol (FARXIGA) 10 MG TABS tablet Take 1 tablet (10 mg total) by mouth daily. 90 tablet 1   diclofenac sodium (VOLTAREN) 1 % GEL Apply 2 g topically 4 (four) times daily. 200 g 5   dicyclomine (BENTYL) 10 MG capsule TAKE 1 CAPSULE (10 MG TOTAL) BY MOUTH 4 (FOUR) TIMES DAILY - BEFORE MEALS AND AT BEDTIME. 90 capsule 3   gabapentin (NEURONTIN) 300  MG capsule Take 1 capsule (300 mg total) by mouth at bedtime. 90 capsule 0   Galcanezumab-gnlm (EMGALITY) 120 MG/ML SOAJ Inject 120 mg into the skin every 30 (thirty) days. 1 mL 5   glucose blood (ONETOUCH VERIO) test strip 1 each by Other route 2 (two) times daily. E11.9     ibuprofen (ADVIL,MOTRIN) 600 MG tablet TAKE 1 TABLET(600 MG) BY MOUTH EVERY 8 HOURS AS NEEDED 30 tablet 0   losartan-hydrochlorothiazide (HYZAAR) 100-25 MG tablet TAKE 1 TABLET BY MOUTH EVERY DAY 90 tablet 3   meloxicam (MOBIC) 15 MG tablet TAKE 1 TABLET (15 MG TOTAL) BY MOUTH DAILY. 30 tablet 5   metFORMIN (GLUCOPHAGE-XR) 500 MG 24 hr tablet TAKE 4 TABLETS BY MOUTH DAILY WITH BREAKFAST 360 tablet 1   methocarbamol (ROBAXIN) 500 MG tablet TAKE 1 TABLET BY MOUTH TWICE DAILY (Patient taking differently: Take 500 mg by mouth  every 6 (six) hours as needed.) 20 tablet 0   omeprazole (PRILOSEC) 40 MG capsule TAKE 1 CAPSULE IN THE [facility] AND AT BEDTIME 180 capsule 0   OneTouch Delica Lancets 57X MISC 1 each by Does not apply route 2 (two) times daily. E11.9     PENNSAID 2 % SOLN SMARTSIG:2 Pump Topical Twice Daily     propranolol (INDERAL) 20 MG tablet Take 1 tablet (20 mg total) by mouth at bedtime. 90 tablet 3   Rimegepant Sulfate (NURTEC) 75 MG TBDP Take 1 tablet by mouth as needed (Take 1 tablet at onset of migraine ( Do not exceed more than 1 tablet in 24 hours)). 8 tablet 0   rizatriptan (MAXALT-MLT) 10 MG disintegrating tablet Take 1 tablet (10 mg total) by mouth as needed for migraine. May repeat in 2 hours if needed 9 tablet 11   Semaglutide, 2 MG/DOSE, (OZEMPIC, 2 MG/DOSE,) 8 MG/3ML SOPN INJECT 2 MG INTO THE SKIN ONCE A WEEK. 9 mL 1   sildenafil (REVATIO) 20 MG tablet Take 20 mg by mouth. 3-5 tabs prn     timolol (TIMOPTIC-XR) 0.5 % ophthalmic gel-forming Place 1 drop into both eyes daily.      No current facility-administered medications on file prior to visit.        ROS:  All others reviewed and negative.  Objective        PE:  BP (!) 142/84 (BP Location: Right Arm, Patient Position: Sitting, Cuff Size: Large)   Pulse 68   Temp 98 F (36.7 C) (Oral)   Ht _0  (1.727 m)   Wt 204 lb (92.5 kg)   SpO2 99%   BMI 31.02 kg/m                 Constitutional: Pt appears in NAD               HENT: Head: NCAT.                Right Ear: External ear normal.                 Left Ear: External ear normal.                Eyes: . Pupils are equal, round, and reactive to light. Conjunctivae and EOM are normal               Nose: without d/c or deformity               Neck: Neck supple. Gross normal ROM  Cardiovascular: Normal rate and regular rhythm.                 Pulmonary/Chest: Effort normal and breath sounds without rales or wheezing.                Abd:  Soft, NT, ND, + BS, no  organomegaly               Neurological: Pt is alert. At baseline orientation, motor grossly intact               Skin: Skin is warm. No rashes, no other new lesions, LE edema - none               Psychiatric: Pt behavior is normal without agitation   Micro: none  Cardiac tracings I have personally interpreted today:  none  Pertinent Radiological findings (summarize): none   Lab Results  Component Value Date   WBC 4.0 02/27/2021   HGB 13.6 02/27/2021   HCT 43.6 02/27/2021   PLT 240.0 02/27/2021   GLUCOSE 114 (H) 02/27/2021   CHOL 135 02/27/2021   TRIG 59.0 02/27/2021   HDL 59.40 02/27/2021   LDLCALC 64 02/27/2021   ALT 27 02/27/2021   AST 23 02/27/2021   NA 140 02/27/2021   K 3.3 (L) 02/27/2021   CL 100 02/27/2021   CREATININE 1.04 02/27/2021   BUN 14 02/27/2021   CO2 31 02/27/2021   TSH 0.81 02/27/2021   PSA 0.89 02/27/2021   HGBA1C 6.6 (A) 04/26/2021   MICROALBUR 0.7 02/27/2021   Assessment/Plan:  Jason Ortega is a 56 y.o. Black or African American [2] male with  has a past medical history of Allergy, Anemia, Arthritis, Cataract, Depression, Diabetes mellitus, Frequent headaches, GERD (gastroesophageal reflux disease), Glaucoma, Hypercholesteremia, Hypertension, Migraines, and Sleep apnea.  Vitamin D deficiency Last vitamin D Lab Results  Component Value Date   VD25OH 26.83 (L) 02/27/2021   Low, to start oral replacement   Type 2 diabetes mellitus without complication, without long-term current use of insulin (HCC) Lab Results  Component Value Date   HGBA1C 6.6 (A) 04/26/2021   Stable, pt to continue current medical treatment farxiga 10 mg qd, metformin ER 500 mg - 4 qd, ozempic 0.5 mg weekly   Hypertension, uncontrolled BP Readings from Last 3 Encounters:  09/08/21 (!) 142/84  07/20/21 (!) 130/92  05/25/21 140/90   Uncontrolled,, pt to increased verapamil sr 240 mg qd   Hyperlipidemia LDL goal <100 Lab Results  Component Value Date   LDLCALC 64  02/27/2021   Stable, pt to continue current statin lipitor 40 mg qd   Cough Pt c/w bronchitis vs pna, declines dxa, for zpack asd,  to f/u any worsening symptoms or concerns  Followup: Return in about 6 months (around 03/01/2022).  Cathlean Cower, MD 09/10/2021 9:06 PM [facility]

## 2021-09-08 NOTE — Patient Instructions (Addendum)
Your COVID testing was not able to be done today - please consider having this done at your local pharmacy  Please take all new medication as prescribed - the antibiotic  OK to increase the Verapamil SR to 240 mg per day  You had the Shingles shot #1 today - and please return for a NURSE VISIT in 2 - 6 months for the second shingles shot (or it can be given at your next appt on Mar 01, 2022)  Please continue all other medications as before, and refills have been done if requested.  Please have the pharmacy call with any other refills you may need.  Please keep your appointments with your specialists as you may have planned

## 2021-09-08 NOTE — Assessment & Plan Note (Signed)
Last vitamin D Lab Results  Component Value Date   VD25OH 26.83 (L) 02/27/2021   Low, to start oral replacement  

## 2021-09-10 ENCOUNTER — Encounter: Payer: Self-pay | Admitting: Internal Medicine

## 2021-09-10 DIAGNOSIS — R059 Cough, unspecified: Secondary | ICD-10-CM | POA: Insufficient documentation

## 2021-09-10 NOTE — Assessment & Plan Note (Signed)
BP Readings from Last 3 Encounters:  09/08/21 (!) 142/84  07/20/21 (!) 130/92  05/25/21 140/90   Uncontrolled,, pt to increased verapamil sr 240 mg qd

## 2021-09-10 NOTE — Assessment & Plan Note (Signed)
Lab Results  Component Value Date   HGBA1C 6.6 (A) 04/26/2021   Stable, pt to continue current medical treatment farxiga 10 mg qd, metformin ER 500 mg - 4 qd, ozempic 0.5 mg weekly

## 2021-09-10 NOTE — Assessment & Plan Note (Signed)
Lab Results  Component Value Date   LDLCALC 64 02/27/2021   Stable, pt to continue current statin lipitor 40 mg qd  

## 2021-09-10 NOTE — Assessment & Plan Note (Signed)
Pt c/w bronchitis vs pna, declines dxa, for zpack asd,  to f/u any worsening symptoms or concerns

## 2021-09-18 NOTE — Progress Notes (Unsigned)
[facility] [address] [place] [address] Phone: [phone] Subjective:   [name], am serving as a scribe for Dr. Antoine Primas.  I'm seeing this patient by the request  of:  Corwin Levins, MD  CC: Right knee pain  LYE:[id number]  07/20/2021 Discussed with patient again.  Patient states that overall it does seem to be improving slowly.  Continue the compression.  Discussed the icing regimen.  Discussed potential injections with patient declined.  Not having any radiation to the hand anymore and no weakness.  Overall evaluation below   Processing regimen this.  Patient is able to continue to work well.  Does seem to have some mild lateral tracking that likely contributed to the exacerbation.  Will increase gabapentin to 300 mg to see if this will help with the pain and this as well as the ulnar subluxation follow-up again in 2 to 3 months  Update 09/20/2021 Stedman [place] is a 56 y.o. male coming in with complaint of L elbow and R knee pain. Patient states that his elbow pain has subsided. L knee pain is over distal quad that is the same as last visit. Wears brace with work.     Past Medical History:  Diagnosis Date   Allergy    Anemia    Arthritis    Cataract    bilateral cataracts removed   Depression    Diabetes mellitus    Frequent headaches    GERD (gastroesophageal reflux disease)    Glaucoma    Hypercholesteremia    Hypertension    Migraines    Sleep apnea    wears c-PAP   Past Surgical History:  Procedure Laterality Date   bilateral cataracts removed     CARDIAC CATHETERIZATION  12/2011   COLONOSCOPY     LEFT HEART CATHETERIZATION WITH CORONARY ANGIOGRAM N/A 11/12/2011   Procedure: LEFT HEART CATHETERIZATION WITH CORONARY ANGIOGRAM;  Surgeon: Pamella Pert, MD;  Location: [facility] CATH LAB;  Service: Cardiovascular;  Laterality: N/A;   SHOULDER SURGERY     Social History   Socioeconomic History   Marital status:  Single    Spouse name: Not on file   Number of children: 2   Years of education: 3   Highest education level: Not on file  Occupational History   Occupation: Nature conservation officer  Tobacco Use   Smoking status: Never   Smokeless tobacco: Never  Vaping Use   Vaping Use: Never used  Substance and Sexual Activity   Alcohol use: Yes    Comment: occasional   Drug use: No   Sexual activity: Yes    Birth control/protection: None  Other Topics Concern   Not on file  Social History Narrative   Fun/Hobby: Psychologist, occupational football   Social Determinants of Health   Financial Resource Strain: Not on file  Food Insecurity: Not on file  Transportation Needs: Not on file  Physical Activity: Not on file  Stress: Not on file  Social Connections: Not on file   Allergies  Allergen Reactions   Pollen Extract Other (See Comments)    Stuffy nose   Family History  Problem Relation Age of Onset   Arthritis Mother    Stroke Mother    Hypertension Father    Diabetes Father    Cancer Father        Multiple Myleoma   Diabetes Maternal Grandfather    Colon cancer Neg Hx    Esophageal cancer Neg Hx  Rectal cancer Neg Hx    Stomach cancer Neg Hx     Current Outpatient Medications (Endocrine & Metabolic):    dapagliflozin propanediol (FARXIGA) 10 MG TABS tablet, Take 1 tablet (10 mg total) by mouth daily.   metFORMIN (GLUCOPHAGE-XR) 500 MG 24 hr tablet, TAKE 4 TABLETS BY MOUTH DAILY WITH BREAKFAST   Semaglutide, 2 MG/DOSE, (OZEMPIC, 2 MG/DOSE,) 8 MG/3ML SOPN, INJECT 2 MG INTO THE SKIN ONCE A WEEK.  Current Outpatient Medications (Cardiovascular):    atorvastatin (LIPITOR) 40 MG tablet, TAKE 1 TABLET BY MOUTH EVERY DAY   losartan-hydrochlorothiazide (HYZAAR) 100-25 MG tablet, TAKE 1 TABLET BY MOUTH EVERY DAY   propranolol (INDERAL) 20 MG tablet, Take 1 tablet (20 mg total) by mouth at bedtime.   sildenafil (REVATIO) 20 MG tablet, Take 20 mg by mouth. 3-5 tabs prn   verapamil (CALAN SR) 240 MG CR tablet, Take  1 tablet (240 mg total) by mouth at bedtime.   Current Outpatient Medications (Analgesics):    aspirin EC 325 MG tablet, Take 1 tablet (325 mg total) by mouth daily.   Galcanezumab-gnlm (EMGALITY) 120 MG/ML SOAJ, Inject 120 mg into the skin every 30 (thirty) days.   ibuprofen (ADVIL,MOTRIN) 600 MG tablet, TAKE 1 TABLET(600 MG) BY MOUTH EVERY 8 HOURS AS NEEDED   meloxicam (MOBIC) 15 MG tablet, TAKE 1 TABLET (15 MG TOTAL) BY MOUTH DAILY.   Rimegepant Sulfate (NURTEC) 75 MG TBDP, Take 1 tablet by mouth as needed (Take 1 tablet at onset of migraine ( Do not exceed more than 1 tablet in 24 hours)).   rizatriptan (MAXALT-MLT) 10 MG disintegrating tablet, Take 1 tablet (10 mg total) by mouth as needed for migraine. May repeat in 2 hours if needed   Current Outpatient Medications (Other):    baclofen (LIORESAL) 10 MG tablet, Take 1 tablet (10 mg total) by mouth 3 (three) times daily. Prn for IBS flare   Blood Glucose Monitoring Suppl (ONETOUCH VERIO) w/Device KIT, 1 Device by Does not apply route 2 (two) times daily as needed. (Patient taking differently: 1 Device by Does not apply route 2 (two) times daily. E11.9)   diclofenac sodium (VOLTAREN) 1 % GEL, Apply 2 g topically 4 (four) times daily.   dicyclomine (BENTYL) 10 MG capsule, TAKE 1 CAPSULE (10 MG TOTAL) BY MOUTH 4 (FOUR) TIMES DAILY - BEFORE MEALS AND AT BEDTIME.   gabapentin (NEURONTIN) 300 MG capsule, Take 1 capsule (300 mg total) by mouth at bedtime.   glucose blood (ONETOUCH VERIO) test strip, 1 each by Other route 2 (two) times daily. E11.9   methocarbamol (ROBAXIN) 500 MG tablet, TAKE 1 TABLET BY MOUTH TWICE DAILY (Patient taking differently: Take 500 mg by mouth every 6 (six) hours as needed.)   omeprazole (PRILOSEC) 40 MG capsule, TAKE 1 CAPSULE IN THE [facility] AND AT BEDTIME   OneTouch Delica Lancets 75Q MISC, 1 each by Does not apply route 2 (two) times daily. E11.9   PENNSAID 2 % SOLN, SMARTSIG:2 Pump Topical Twice Daily   timolol  (TIMOPTIC-XR) 0.5 % ophthalmic gel-forming, Place 1 drop into both eyes daily.    Reviewed prior external information including notes and imaging from  primary care provider As well as notes that were available from care everywhere and other healthcare systems.  Past medical history, social, surgical and family history all reviewed in electronic medical record.  No pertanent information unless stated regarding to the chief complaint.   Review of Systems:  No headache, visual changes, nausea, vomiting, diarrhea,  constipation, dizziness, abdominal pain, skin rash, fevers, chills, night sweats, weight loss, swollen lymph nodes, body aches, joint swelling, chest pain, shortness of breath, mood changes. POSITIVE muscle aches  Objective  Blood pressure 110/76, pulse 68, height $RemoveBe'5\' 8"'QQKpKZPFl$  (1.727 m), weight 209 lb (94.8 kg), SpO2 98 %.   General: No apparent distress alert and oriented x3 mood and affect normal, dressed appropriately.  HEENT: Pupils equal, extraocular movements intact  Respiratory: Patient's speak in full sentences and does not appear short of breath  Cardiovascular: No lower extremity edema, non tender, no erythema  Left knee does not have any significant swelling.  More tender to palpation over the quadricep tendon in the posterior aspect of the knee.  Still some mild tenderness over the medial aspect.  Does have mild positive McMurray still noted.  Limited muscular skeletal ultrasound was performed and interpreted by Hulan Saas, M  Limited ultrasound shows the patient does have calcific changes noted of the quadricep tendon near the insertion of the patella.  Still be degenerative and partial displacement of the medial meniscus still noted. Impression: Calcific tendinitis of the quadricep and meniscal injury    Impression and Recommendations:

## 2021-09-20 ENCOUNTER — Ambulatory Visit (INDEPENDENT_AMBULATORY_CARE_PROVIDER_SITE_OTHER): Payer: 59 | Admitting: Family Medicine

## 2021-09-20 ENCOUNTER — Ambulatory Visit: Payer: Self-pay

## 2021-09-20 VITALS — BP 110/76 | HR 68 | Ht 68.0 in | Wt 209.0 lb

## 2021-09-20 DIAGNOSIS — S83206A Unspecified tear of unspecified meniscus, current injury, right knee, initial encounter: Secondary | ICD-10-CM

## 2021-09-20 DIAGNOSIS — M25522 Pain in left elbow: Secondary | ICD-10-CM | POA: Diagnosis not present

## 2021-09-20 DIAGNOSIS — M94261 Chondromalacia, right knee: Secondary | ICD-10-CM | POA: Diagnosis not present

## 2021-09-20 NOTE — Patient Instructions (Addendum)
Consider the shockwave therapy Otherwise continue everything else you're doing Not fully released yet

## 2021-09-20 NOTE — Assessment & Plan Note (Signed)
Continues to have some difficulty with the knee.  Was found to have some calcific changes noted of the quadricep tendon more proximally.  Patient does have the meniscal tear as well that does make it somewhat concerning of which is causing more discomfort at the moment.  Discussed with patient about the possibility of either doing shockwave therapy to help with the calcific aspect of the quadricep or patient is having more of the pain.  Otherwise we do need to consider surgical intervention otherwise.  Patient is going to consider.  Follow-up with me again in 6 to 8 weeks.

## 2021-09-28 ENCOUNTER — Other Ambulatory Visit: Payer: Self-pay | Admitting: Family Medicine

## 2021-09-28 MED ORDER — NURTEC 75 MG PO TBDP: 1.0000 | ORAL_TABLET | ORAL | 5 refills | Status: DC | PRN

## 2021-10-13 ENCOUNTER — Other Ambulatory Visit: Payer: Self-pay | Admitting: Endocrinology

## 2021-10-13 ENCOUNTER — Ambulatory Visit (INDEPENDENT_AMBULATORY_CARE_PROVIDER_SITE_OTHER): Payer: Self-pay | Admitting: Family Medicine

## 2021-10-13 ENCOUNTER — Other Ambulatory Visit (INDEPENDENT_AMBULATORY_CARE_PROVIDER_SITE_OTHER): Payer: 59

## 2021-10-13 DIAGNOSIS — E119 Type 2 diabetes mellitus without complications: Secondary | ICD-10-CM | POA: Diagnosis not present

## 2021-10-13 DIAGNOSIS — M76891 Other specified enthesopathies of right lower limb, excluding foot: Secondary | ICD-10-CM

## 2021-10-13 LAB — BASIC METABOLIC PANEL
BUN: 16 mg/dL (ref 6–23)
CO2: 33 mEq/L — ABNORMAL HIGH (ref 19–32)
Calcium: 9.5 mg/dL (ref 8.4–10.5)
Chloride: 100 mEq/L (ref 96–112)
Creatinine, Ser: 1.23 mg/dL (ref 0.40–1.50)
GFR: 64.95 mL/min (ref >60.00)
Glucose, Bld: 111 mg/dL — ABNORMAL HIGH (ref 70–99)
Potassium: 3.8 mEq/L (ref 3.5–5.1)
Sodium: 138 mEq/L (ref 135–145)

## 2021-10-13 LAB — HEMOGLOBIN A1C: Hgb A1c MFr Bld: 7.1 % — ABNORMAL HIGH (ref 4.6–6.5)

## 2021-10-13 NOTE — Progress Notes (Signed)
   [facility] [address] [place] [address] Phone: [phone]   Extracorporeal Shockwave Therapy Note    Patient is being treated today with ECSWT. Informed consent was obtained and patient tolerated procedure well.   Therapy performed by Clementeen Graham  Condition treated: Chronic calcific quad tendinitis right knee Treatment preset used: Patellar tendinitis Energy used: 90 mJ Frequency used: 10 Hz Number of pulses: 2000 Treatment #1 of #4-6  Electronically signed by:  [facility] 12:47 PM 10/13/21

## 2021-10-18 ENCOUNTER — Other Ambulatory Visit: Payer: 59

## 2021-10-20 ENCOUNTER — Ambulatory Visit (INDEPENDENT_AMBULATORY_CARE_PROVIDER_SITE_OTHER): Payer: Self-pay | Admitting: Family Medicine

## 2021-10-20 VITALS — Ht 68.0 in

## 2021-10-20 DIAGNOSIS — M76891 Other specified enthesopathies of right lower limb, excluding foot: Secondary | ICD-10-CM

## 2021-10-20 NOTE — Progress Notes (Signed)
   [facility] [address] [place] [address] Phone: [phone]   Extracorporeal Shockwave Therapy Note    Patient is being treated today with ECSWT. Informed consent was obtained and patient tolerated procedure well.   Therapy performed by Clementeen Graham  Condition treated: Chronic calcific quad tendinitis of the right knee. Treatment preset used: Patellar tendinitis Energy used: 90 mJ Frequency used: 10 Hz Number of pulses: 2000 Treatment #2 of #4-6  Electronically signed by:  [facility] 11:13 AM 10/20/21

## 2021-10-24 ENCOUNTER — Encounter: Payer: Self-pay | Admitting: Endocrinology

## 2021-10-24 ENCOUNTER — Ambulatory Visit (INDEPENDENT_AMBULATORY_CARE_PROVIDER_SITE_OTHER): Payer: 59 | Admitting: Endocrinology

## 2021-10-24 VITALS — BP 144/82 | HR 74 | Ht 68.0 in | Wt 205.4 lb

## 2021-10-24 DIAGNOSIS — I10 Essential (primary) hypertension: Secondary | ICD-10-CM

## 2021-10-24 DIAGNOSIS — E1165 Type 2 diabetes mellitus with hyperglycemia: Secondary | ICD-10-CM | POA: Diagnosis not present

## 2021-10-24 MED ORDER — METFORMIN HCL 1000 MG PO TABS: 1000.0000 mg | ORAL_TABLET | Freq: Two times a day (BID) | ORAL | 3 refills | Status: DC

## 2021-10-24 NOTE — Patient Instructions (Addendum)
Check blood sugars on waking up 2-3 days a week  Also check blood sugars about 2 hours after meals and do this after different meals by rotation  Recommended blood sugar levels on waking up are 90-130 and about 2 hours after meal is 130-160  Please bring your blood sugar monitor to each visit, thank you   

## 2021-10-24 NOTE — Progress Notes (Signed)
Patient ID: Jason Ortega, male   DOB: 1965/05/21, 56 y.o.   MRN: [id number]           Reason for Appointment: Type II Diabetes follow-up   History of Present Illness   Diagnosis date: 1999  Previous history:  Non-insulin hypoglycemic drugs previously used: Metformin, Prandin, Amaryl, Byetta Insulin was used in 2020 for a few months Ozempic was started in 2020  A1c range in the last few years is: 5.5-7.9  Recent history:     Non-insulin hypoglycemic drugs: Metformin ER 2 g daily, Ozempic 2 mg weekly, Farxiga 10 mg daily    Side effects from medications: None  Current self management, blood sugar patterns and problems identified:  A1c is 7.1 compared to 6.6 He has been on 2 mg Ozempic since 5/22 He has not checked his blood sugars in quite some time and likely also checks readings mostly in the morning Although he thinks he is active at work he is not doing a lot of walking as before Also because of stress he thinks that he has not been planning his meals well He has not been to a nutritionist in the past No recent weight change He does state that he is taking all his medications including Ozempic regularly  Exercise: less walking recently To go to gym  Diet management: Diet has been variable, currently not on any meal plan     Hypoglycemia:  none    Glucometer: One Touch.           Blood Glucose readings from recall:  PRE-MEAL Fasting Lunch Dinner Bedtime Overall  Glucose range: 100-120      Mean/median:        POST-MEAL PC Breakfast PC Lunch PC Dinner  Glucose range:   120-150  Mean/median:       Dietician visit: Most recent:   None  Weight control:  Wt Readings from Last 3 Encounters:  10/24/21 205 lb 6.4 oz (93.2 kg)  09/20/21 209 lb (94.8 kg)  09/08/21 204 lb (92.5 kg)            Diabetes labs:  Lab Results  Component Value Date   HGBA1C 7.1 (H) 10/13/2021   HGBA1C 6.6 (A) 04/26/2021   HGBA1C 6.9 (H) 02/27/2021   Lab Results  Component  Value Date   MICROALBUR 0.7 02/27/2021   LDLCALC 64 02/27/2021   CREATININE 1.23 10/13/2021    No results found for: "FRUCTOSAMINE"   Allergies as of 10/24/2021       Reactions   Pollen Extract Other (See Comments)   Stuffy nose        Medication List        Accurate as of October 24, 2021 10:30 AM. If you have any questions, ask your nurse or doctor.          STOP taking these medications    metFORMIN 500 MG 24 hr tablet Commonly known as: GLUCOPHAGE-XR Replaced by: metFORMIN 1000 MG tablet Stopped by: Elayne Snare, MD       TAKE these medications    aspirin EC 325 MG tablet Take 1 tablet (325 mg total) by mouth daily.   atorvastatin 40 MG tablet Commonly known as: LIPITOR TAKE 1 TABLET BY MOUTH EVERY DAY   baclofen 10 MG tablet Commonly known as: LIORESAL Take 1 tablet (10 mg total) by mouth 3 (three) times daily. Prn for IBS flare   dapagliflozin propanediol 10 MG Tabs tablet Commonly known as: Farxiga Take 1 tablet (10 mg  total) by mouth daily.   diclofenac sodium 1 % Gel Commonly known as: VOLTAREN Apply 2 g topically 4 (four) times daily.   Pennsaid 2 % Soln Generic drug: diclofenac Sodium SMARTSIG:2 Pump Topical Twice Daily   dicyclomine 10 MG capsule Commonly known as: BENTYL TAKE 1 CAPSULE (10 MG TOTAL) BY MOUTH 4 (FOUR) TIMES DAILY - BEFORE MEALS AND AT BEDTIME.   Emgality 120 MG/ML Soaj Generic drug: Galcanezumab-gnlm Inject 120 mg into the skin every 30 (thirty) days.   gabapentin 300 MG capsule Commonly known as: NEURONTIN Take 1 capsule (300 mg total) by mouth at bedtime.   ibuprofen 600 MG tablet Commonly known as: ADVIL TAKE 1 TABLET(600 MG) BY MOUTH EVERY 8 HOURS AS NEEDED   losartan-hydrochlorothiazide 100-25 MG tablet Commonly known as: HYZAAR TAKE 1 TABLET BY MOUTH EVERY DAY   meloxicam 15 MG tablet Commonly known as: MOBIC TAKE 1 TABLET (15 MG TOTAL) BY MOUTH DAILY.   metFORMIN 1000 MG tablet Commonly known  as: GLUCOPHAGE Take 1 tablet (1,000 mg total) by mouth 2 (two) times daily with a meal. Replaces: metFORMIN 500 MG 24 hr tablet Started by: Elayne Snare, MD   methocarbamol 500 MG tablet Commonly known as: ROBAXIN TAKE 1 TABLET BY MOUTH TWICE DAILY What changed:  when to take this reasons to take this   Nurtec 75 MG Tbdp Generic drug: Rimegepant Sulfate Take 1 tablet by mouth as needed (Take 1 tablet at onset of migraine ( Do not exceed more than 1 tablet in 24 hours)).   omeprazole 40 MG capsule Commonly known as: PRILOSEC TAKE 1 CAPSULE IN THE [facility] AND AT BEDTIME   OneTouch Delica Lancets 33I Misc 1 each by Does not apply route 2 (two) times daily. E11.9   OneTouch Verio test strip Generic drug: glucose blood 1 each by Other route 2 (two) times daily. E11.9   OneTouch Verio w/Device Kit 1 Device by Does not apply route 2 (two) times daily as needed. What changed:  when to take this additional instructions   Ozempic (2 MG/DOSE) 8 MG/3ML Sopn Generic drug: Semaglutide (2 MG/DOSE) INJECT 2 MG INTO THE SKIN ONCE A WEEK.   propranolol 20 MG tablet Commonly known as: INDERAL Take 1 tablet (20 mg total) by mouth at bedtime.   rizatriptan 10 MG disintegrating tablet Commonly known as: MAXALT-MLT Take 1 tablet (10 mg total) by mouth as needed for migraine. May repeat in 2 hours if needed   sildenafil 20 MG tablet Commonly known as: REVATIO Take 20 mg by mouth. 3-5 tabs prn   timolol 0.5 % ophthalmic gel-forming Commonly known as: TIMOPTIC-XR Place 1 drop into both eyes daily.   verapamil 240 MG CR tablet Commonly known as: Calan SR Take 1 tablet (240 mg total) by mouth at bedtime.        Allergies:  Allergies  Allergen Reactions   Pollen Extract Other (See Comments)    Stuffy nose    Past Medical History:  Diagnosis Date   Allergy    Anemia    Arthritis    Cataract    bilateral cataracts removed   Depression    Diabetes mellitus    Frequent  headaches    GERD (gastroesophageal reflux disease)    Glaucoma    Hypercholesteremia    Hypertension    Migraines    Sleep apnea    wears c-PAP    Past Surgical History:  Procedure Laterality Date   bilateral cataracts removed  CARDIAC CATHETERIZATION  12/2011   COLONOSCOPY     LEFT HEART CATHETERIZATION WITH CORONARY ANGIOGRAM N/A 11/12/2011   Procedure: LEFT HEART CATHETERIZATION WITH CORONARY ANGIOGRAM;  Surgeon: Laverda Page, MD;  Location: [facility] CATH LAB;  Service: Cardiovascular;  Laterality: N/A;   SHOULDER SURGERY      Family History  Problem Relation Age of Onset   Arthritis Mother    Stroke Mother    Hypertension Father    Diabetes Father    Cancer Father        Multiple Myleoma   Diabetes Maternal Grandfather    Colon cancer Neg Hx    Esophageal cancer Neg Hx    Rectal cancer Neg Hx    Stomach cancer Neg Hx     Social History:  reports that he has never smoked. He has never used smokeless tobacco. He reports current alcohol use. He reports that he does not use drugs.  Review of Systems:  Last diabetic eye exam date 2023  Last urine microalbumin date: 02/2021  Last foot exam date: 9/23  Symptoms of neuropathy: none  Hypertension:   ACE/ARB medication: Losartan, also on verapamil and HCTZ  BP Readings from Last 3 Encounters:  10/24/21 (!) 144/82  09/20/21 110/76  09/08/21 (!) 142/84    Lipid management: LDL controlled with Lipitor 40 mg daily from his PCP    Lab Results  Component Value Date   CHOL 135 02/27/2021   CHOL 124 03/03/2020   CHOL 143 09/01/2019   Lab Results  Component Value Date   HDL 59.40 02/27/2021   HDL 50.00 03/03/2020   HDL 57 09/01/2019   Lab Results  Component Value Date   LDLCALC 64 02/27/2021   LDLCALC 60 03/03/2020   LDLCALC 73 09/01/2019   Lab Results  Component Value Date   TRIG 59.0 02/27/2021   TRIG 67.0 03/03/2020   TRIG 54 09/01/2019   Lab Results  Component Value Date   CHOLHDL 2 02/27/2021    CHOLHDL 2 03/03/2020   CHOLHDL 2.5 09/01/2019   No results found for: "LDLDIRECT"  Unable to calculate Fibrosis 4 Score. Requires ALT, AST, and platelet count within the last 6 months.      Examination:   BP (!) 144/82   Pulse 74   Ht $R'5\' 8"'aP$  (1.727 m)   Wt 205 lb 6.4 oz (93.2 kg)   SpO2 98%   BMI 31.23 kg/m   Body mass index is 31.23 kg/m.   Diabetic Foot Exam - Simple   Simple Foot Form Diabetic Foot exam was performed with the following findings: Yes   Visual Inspection No deformities, no ulcerations, no other skin breakdown bilaterally: Yes Sensation Testing Intact to touch and monofilament testing bilaterally: Yes Pulse Check Posterior Tibialis and Dorsalis pulse intact bilaterally: Yes Comments    No edema present  ASSESSMENT/ PLAN:    Diabetes type 2 non-insulin-dependent with mild obesity:   Current regimen: Metformin, Ozempic and Farxiga  See history of present illness for detailed discussion of current diabetes management, blood sugar patterns and problems identified  A1c is 7.1 and slightly higher  He feels that his blood sugars are higher from stress and not following diet consistently or exercising Also not monitoring blood sugars at home Likely has some postprandial hyperglycemia since lab fasting glucose 111 Has been regular with his medications  Recommendations:  No change in medications as yet although may consider Mounjaro if A1c continues to go up For simplicity he can take metformin 1  g twice daily instead of 4 tablets of extended release 500 mg Also he can take Xigduo when he is ready to refill his metformin and Wilder Glade which will help his co-pay Start monitoring blood sugars again with more readings after meals and discussed blood sugar targets He will need to start watching his meal planning with lower calories and carbohydrate He can start exercising at the gym as planned Follow-up in 4 months unless having higher blood  sugars  HYPERTENSION: Blood pressure is relatively higher today but may be some whitecoat syndrome He will need to follow-up with his PCP and also would benefit from home monitoring  Patient Instructions  Check blood sugars on waking up 2-3 days a week  Also check blood sugars about 2 hours after meals and do this after different meals by rotation  Recommended blood sugar levels on waking up are 90-130 and about 2 hours after meal is 130-160  Please bring your blood sugar monitor to each visit, thank you   Total visit time for evaluation and management, review of relevant records and counseling = 30 minutes  Kerby Borner 10/24/2021, 10:30 AM

## 2021-10-27 ENCOUNTER — Ambulatory Visit (INDEPENDENT_AMBULATORY_CARE_PROVIDER_SITE_OTHER): Payer: Self-pay | Admitting: Family Medicine

## 2021-10-27 DIAGNOSIS — M76891 Other specified enthesopathies of right lower limb, excluding foot: Secondary | ICD-10-CM

## 2021-10-27 NOTE — Progress Notes (Signed)
   [facility] [place] Phone: [phone]   Extracorporeal Shockwave Therapy Note    Patient is being treated today with ECSWT. Informed consent was obtained and patient tolerated procedure well.   Therapy performed by Lynne Leader  Condition treated: Quadricep tendonitis Treatment preset used: patellar tendonitis Energy used: 90 mJ Frequency used: 10 Hz Number of pulses: 2000 Treatment #3 of #4  Electronically signed by:  [facility] 7:56 AM 10/27/21

## 2021-10-27 NOTE — Patient Instructions (Signed)
Thank you for coming in today.   You received a shockwave treatment this morning  Schedule another treatment for next week

## 2021-11-03 ENCOUNTER — Ambulatory Visit: Payer: Self-pay | Admitting: Family Medicine

## 2021-11-03 DIAGNOSIS — M76891 Other specified enthesopathies of right lower limb, excluding foot: Secondary | ICD-10-CM

## 2021-11-03 NOTE — Progress Notes (Signed)
   [facility] [place] Phone: [phone]   Extracorporeal Shockwave Therapy Note    Patient is being treated today with ECSWT. Informed consent was obtained and patient tolerated procedure well.   Therapy performed by Lynne Leader  Condition treated: Quadriceps tendinitis Treatment preset used: Patellar tendinitis Energy used: 90 mJ Frequency used: 14 Hz Number of pulses: 2000 Treatment #4 of #4-6  Electronically signed by:  [facility] 7:59 AM 11/03/21  .  Recheck as needed

## 2021-11-03 NOTE — Patient Instructions (Signed)
Recheck as needed °

## 2021-11-24 ENCOUNTER — Other Ambulatory Visit: Payer: Self-pay | Admitting: Gastroenterology

## 2021-11-29 NOTE — Progress Notes (Signed)
PATIENT: Jason Ortega DOB: 1965/04/09  REASON FOR VISIT: follow up HISTORY FROM: patient  Virtual Visit via Telephone Note  I connected with Jason Ortega on 12/06/21 at  8:30 AM EDT by telephone and verified that I am speaking with the correct person using two identifiers.   I discussed the limitations, risks, security and privacy concerns of performing an evaluation and management service by telephone and the availability of in person appointments. I also discussed with the patient that there may be a patient responsible charge related to this service. The patient expressed understanding and agreed to proceed.   History of Present Illness:  12/06/21 ALL:  Jason Ortega returns for follow up for migraines. He continues Emgality monthly, propranolol 20mg  at bedtime and rizatriptan or Nurtec as needed. He reports having 2-3 migraine days a month. Nurtec works very well. He rarely uses rizatriptan. He reports BP has been elevated which contributes to more headache days. He has had more stress, recently. He uses CPAP sporadically, usually 3-4 times a week. He was followed by Helene Kelp Day who has retired. He called to schedule follow up but reports not hearing back. I referred him to our sleep providers last year. Multiple attempts were made to schedule appt but unable to connect with him.   01/03/2021 ALL: Jason Ortega returns for follow up for migraines. We continued Amovig and rizatriptan at last visit and added propranolol 20mg  BID at last visit 04/2020. He reports that he was unable to tolerate side effects of sleepiness and stopped this medication. Amovig was switched to [employer] due to insurance preference. Headaches are stable. He has about 2 severe migraines a month that requires he call out of work. He has 2-10 headache days a month. Rizatriptan does help. He has not been able to follow up with CPAP provider. He reports using CPAP nightly. He would like to transfer care to our office. Last sleep evaluation  about 5 years ago.   04/20/20 ALL: He returns for follow up for migraines. He continues Amovig and rizatriptan. Also taking verapamil 180mg  daily and gabapentin 600mg  at night PRN prescribed by his PCP for HTN and sleep. He continues to have a tension style, bifrontal, dull headache everyday. He has had 2-3 migraines since January. Rizatriptan is working well for abortive therapy. He admits that BP has been a little higher than normal. Usually 130/80's at home but has been 150's/80's. He is on multiple hypertensive agents.  He is using CPAP nightly. He has not been seen by sleep provider. He called to schedule appt for January that was cancelled by the provider.  01/11/2020 ALL:  Jason Ortega is a 56 y.o. male here today for follow up for migraine headaches.  He continues Aimovig monthly.  He has continued to have regular headaches.  He reports to bad migraines over the past 8 months.  He has used rizatriptan and sumatriptan regularly, usually taking a full prescription of each medication every month.  He reports that rizatriptan seems to be a little bit more effective than sumatriptan.  He reports that he is using CPAP nightly.  He has not been seen by his sleep provider since March 2019.  He states that he has continued to receive supplies through DME.  He continues follow-up with PCP regularly.   Observations/Objective:  Generalized: Well developed, in no acute distress  Mentation: Alert oriented to time, place, history taking. Follows all commands speech and language fluent   Assessment and Plan:  56 y.o. year old male  has a past medical history of Allergy, Anemia, Arthritis, Cataract, Depression, Diabetes mellitus, Frequent headaches, GERD (gastroesophageal reflux disease), Glaucoma, Hypercholesteremia, Hypertension, Migraines, and Sleep apnea. here with    ICD-10-CM   1. Chronic migraine w/o aura w/o status migrainosus, not intractable  G43.709     2. OSA on CPAP  G47.33       Jason Ortega  reports improvement in headaches since last visit. He has about 2-3 severe migraines per month. Nurtec works well. He has not used rizatriptan much, recently. He will continue Emgality monthly and propranolol 20mg  QHS for prevention. May increase propranolol to BID dosing if he can tolerate. Continue Nurtec and rizatriptan for abortive therapy. He was encouraged to call sleep provider to schedule follow up to review apnea management. He was referred to our sleep lab but never scheduled appt. Healthy lifestyle habits reviewed. He will follow up in 1 year.  He verbalizes understanding and agreement with this plan.   No orders of the defined types were placed in this encounter.    Meds ordered this encounter  Medications   propranolol (INDERAL) 20 MG tablet    Sig: Take 1 tablet (20 mg total) by mouth at bedtime.    Dispense:  90 tablet    Refill:  3    Order Specific Question:   Supervising Provider    Answer:   Melvenia Beam [[id number]]   Rimegepant Sulfate (NURTEC) 75 MG TBDP    Sig: Take 1 tablet by mouth as needed (Take 1 tablet at onset of migraine ( Do not exceed more than 1 tablet in 24 hours)).    Dispense:  8 tablet    Refill:  5    Order Specific Question:   Supervising Provider    Answer:   Melvenia Beam [[id number]]   Galcanezumab-gnlm (EMGALITY) 120 MG/ML SOAJ    Sig: Inject 120 mg into the skin every 30 (thirty) days.    Dispense:  3 mL    Refill:  3    Order Specific Question:   Supervising Provider    Answer:   Melvenia Beam [id number]      Follow Up Instructions:  I discussed the assessment and treatment plan with the patient. The patient was provided an opportunity to ask questions and all were answered. The patient agreed with the plan and demonstrated an understanding of the instructions.   The patient was advised to call back or seek an in-person evaluation if the symptoms worsen or if the condition fails to improve as anticipated.  I provided 15 minutes of  non-face-to-face time during this encounter. Mychart visit converted to televisit due to technical concerns. Patient is located at his place of employment, provider is in the office.    Debbora Presto, NP

## 2021-11-30 NOTE — Progress Notes (Signed)
[place] Phone: [phone] Subjective:   [name], am serving as a scribe for Dr. Hulan Saas.  I'm seeing this patient by the request  of:  Biagio Borg, MD CC: Right knee pain  ZES:[id number]  09/20/2021 Continues to have some difficulty with the knee.  Was found to have somee calcific changes noted of the quadricep tendon more proximally.  Patient does have the meniscal tear as well that does make it somewhat concerning of which is causing more discomfort at the moment.  Discussed with patient about the possibility of either doing shockwave therapy to help with the calcific aspect of the quadricep or patient is having more of the pain.  Otherwise we do need to consider surgical intervention otherwise.  Patient is going to consider.  Follow-up with me again in 6 to 8 weeks.  Update 12/05/2021 Giovonni Poirier is a 56 y.o. male coming in with complaint of R knee pain. Has been received shockwave tx. Patient states that his pain is over vastus lateralis and radiates underneath patella. Also has burning in quad that radiates to hip.        Past Medical History:  Diagnosis Date   Allergy    Anemia    Arthritis    Cataract    bilateral cataracts removed   Depression    Diabetes mellitus    Frequent headaches    GERD (gastroesophageal reflux disease)    Glaucoma    Hypercholesteremia    Hypertension    Migraines    Sleep apnea    wears c-PAP   Past Surgical History:  Procedure Laterality Date   bilateral cataracts removed     CARDIAC CATHETERIZATION  12/2011   COLONOSCOPY     LEFT HEART CATHETERIZATION WITH CORONARY ANGIOGRAM N/A 11/12/2011   Procedure: LEFT HEART CATHETERIZATION WITH CORONARY ANGIOGRAM;  Surgeon: Laverda Page, MD;  Location: [facility] CATH LAB;  Service: Cardiovascular;  Laterality: N/A;   SHOULDER SURGERY     Social History   Socioeconomic History   Marital status: Single     Spouse name: Not on file   Number of children: 2   Years of education: 4   Highest education level: Not on file  Occupational History   Occupation: Clinical research associate  Tobacco Use   Smoking status: Never   Smokeless tobacco: Never  Vaping Use   Vaping Use: Never used  Substance and Sexual Activity   Alcohol use: Yes    Comment: occasional   Drug use: No   Sexual activity: Yes    Birth control/protection: None  Other Topics Concern   Not on file  Social History Narrative   Fun/Hobby: Leisure centre manager football   Social Determinants of Health   Financial Resource Strain: Not on file  Food Insecurity: Not on file  Transportation Needs: Not on file  Physical Activity: Not on file  Stress: Not on file  Social Connections: Not on file   Allergies  Allergen Reactions   Pollen Extract Other (See Comments)    Stuffy nose   Family History  Problem Relation Age of Onset   Arthritis Mother    Stroke Mother    Hypertension Father    Diabetes Father    Cancer Father        Multiple Myleoma   Diabetes Maternal Grandfather    Colon cancer Neg Hx    Esophageal cancer Neg Hx    Rectal cancer Neg Hx  Stomach cancer Neg Hx     Current Outpatient Medications (Endocrine & Metabolic):    dapagliflozin propanediol (FARXIGA) 10 MG TABS tablet, Take 1 tablet (10 mg total) by mouth daily.   metFORMIN (GLUCOPHAGE) 1000 MG tablet, Take 1 tablet (1,000 mg total) by mouth 2 (two) times daily with a meal.   Semaglutide, 2 MG/DOSE, (OZEMPIC, 2 MG/DOSE,) 8 MG/3ML SOPN, INJECT 2 MG INTO THE SKIN ONCE A WEEK.  Current Outpatient Medications (Cardiovascular):    atorvastatin (LIPITOR) 40 MG tablet, TAKE 1 TABLET BY MOUTH EVERY DAY   losartan-hydrochlorothiazide (HYZAAR) 100-25 MG tablet, TAKE 1 TABLET BY MOUTH EVERY DAY   propranolol (INDERAL) 20 MG tablet, Take 1 tablet (20 mg total) by mouth at bedtime.   sildenafil (REVATIO) 20 MG tablet, Take 20 mg by mouth. 3-5 tabs prn   verapamil (CALAN SR) 240 MG CR  tablet, Take 1 tablet (240 mg total) by mouth at bedtime.   Current Outpatient Medications (Analgesics):    aspirin EC 325 MG tablet, Take 1 tablet (325 mg total) by mouth daily.   Galcanezumab-gnlm (EMGALITY) 120 MG/ML SOAJ, Inject 120 mg into the skin every 30 (thirty) days.   ibuprofen (ADVIL,MOTRIN) 600 MG tablet, TAKE 1 TABLET(600 MG) BY MOUTH EVERY 8 HOURS AS NEEDED   meloxicam (MOBIC) 15 MG tablet, TAKE 1 TABLET (15 MG TOTAL) BY MOUTH DAILY.   Rimegepant Sulfate (NURTEC) 75 MG TBDP, Take 1 tablet by mouth as needed (Take 1 tablet at onset of migraine ( Do not exceed more than 1 tablet in 24 hours)).   rizatriptan (MAXALT-MLT) 10 MG disintegrating tablet, Take 1 tablet (10 mg total) by mouth as needed for migraine. May repeat in 2 hours if needed   Current Outpatient Medications (Other):    baclofen (LIORESAL) 10 MG tablet, Take 1 tablet (10 mg total) by mouth 3 (three) times daily. Prn for IBS flare   Blood Glucose Monitoring Suppl (ONETOUCH VERIO) w/Device KIT, 1 Device by Does not apply route 2 (two) times daily as needed. (Patient taking differently: 1 Device by Does not apply route 2 (two) times daily. E11.9)   diclofenac sodium (VOLTAREN) 1 % GEL, Apply 2 g topically 4 (four) times daily.   dicyclomine (BENTYL) 10 MG capsule, TAKE 1 CAPSULE (10 MG TOTAL) BY MOUTH 4 (FOUR) TIMES DAILY - BEFORE MEALS AND AT BEDTIME.   gabapentin (NEURONTIN) 300 MG capsule, Take 1 capsule (300 mg total) by mouth at bedtime.   glucose blood (ONETOUCH VERIO) test strip, 1 each by Other route 2 (two) times daily. E11.9   methocarbamol (ROBAXIN) 500 MG tablet, TAKE 1 TABLET BY MOUTH TWICE DAILY (Patient taking differently: Take 500 mg by mouth every 6 (six) hours as needed.)   omeprazole (PRILOSEC) 40 MG capsule, TAKE 1 CAPSULE IN THE [facility] AND AT BEDTIME   OneTouch Delica Lancets 50N MISC, 1 each by Does not apply route 2 (two) times daily. E11.9   PENNSAID 2 % SOLN, SMARTSIG:2 Pump Topical Twice Daily    timolol (TIMOPTIC-XR) 0.5 % ophthalmic gel-forming, Place 1 drop into both eyes daily.    Reviewed prior external information including notes and imaging from  primary care provider As well as notes that were available from care everywhere and other healthcare systems.  Past medical history, social, surgical and family history all reviewed in electronic medical record.  No pertanent information unless stated regarding to the chief complaint.   Review of Systems:  No headache, visual changes, nausea, vomiting, diarrhea, constipation, dizziness,  abdominal pain, skin rash, fevers, chills, night sweats, weight loss, swollen lymph nodes, body aches, joint swelling, chest pain, shortness of breath, mood changes. POSITIVE muscle aches  Objective  Blood pressure (!) 148/102, pulse 74, height 5' 8" (1.727 m), weight 208 lb (94.3 kg), SpO2 98 %.   General: No apparent distress alert and oriented x3 mood and affect normal, dressed appropriately.  HEENT: Pupils equal, extraocular movements intact  Respiratory: Patient's speak in full sentences and does not appear short of breath  Cardiovascular: No lower extremity edema, non tender, no erythema  Right knee does have some lateral tracking of the patella noted.  Patient does have tenderness to palpation mostly over the patellofemoral joint.  Positive grind test noted.  Quadriceps seems to be somewhat better though.  Limited muscular skeletal ultrasound was performed and interpreted by Hulan Saas, M  Limited ultrasound shows significant improvement in the calcific changes of the quadricep tendon.  No increase in Doppler flow or hypoechoic changes.  Patient does have narrowing of the patellofemoral joint noted. Impression: Interval improvement of the quadricep tendon but continued patellofemoral arthritis  After informed written and verbal consent, patient was seated on exam table. Right knee was prepped with alcohol swab and utilizing anterolateral  approach, patient's right knee space was injected with 4:1  marcaine 0.5%: Kenalog 32m/dL. Patient tolerated the procedure well without immediate complications.    Impression and Recommendations:

## 2021-12-05 ENCOUNTER — Encounter: Payer: Self-pay | Admitting: Family Medicine

## 2021-12-05 ENCOUNTER — Telehealth: Payer: Self-pay | Admitting: *Deleted

## 2021-12-05 ENCOUNTER — Ambulatory Visit: Payer: Self-pay

## 2021-12-05 ENCOUNTER — Ambulatory Visit (INDEPENDENT_AMBULATORY_CARE_PROVIDER_SITE_OTHER): Payer: 59 | Admitting: Family Medicine

## 2021-12-05 VITALS — BP 148/102 | HR 74 | Ht 68.0 in | Wt 208.0 lb

## 2021-12-05 DIAGNOSIS — M94261 Chondromalacia, right knee: Secondary | ICD-10-CM | POA: Diagnosis not present

## 2021-12-05 DIAGNOSIS — G8929 Other chronic pain: Secondary | ICD-10-CM | POA: Diagnosis not present

## 2021-12-05 DIAGNOSIS — M25561 Pain in right knee: Secondary | ICD-10-CM | POA: Diagnosis not present

## 2021-12-05 NOTE — Patient Instructions (Signed)
Injected knee today MRI R knee Gel will get approved See me again in 6 weeks

## 2021-12-05 NOTE — Telephone Encounter (Signed)
Visco - R knee initiated through portals.  

## 2021-12-05 NOTE — Assessment & Plan Note (Signed)
Patient given injection and tolerated the procedure well.  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  This was 2 years ago.  Patient has made improvement in the calcific tendinitis after the shockwave therapy but continues to have some discomfort.  Increases instability.  Affecting job performance.  Given injection so patient can continue working but I do feel that the majority of this we should consider evaluate.  I do think patient could be a candidate for discussed with patient.  After approval for this is well depending on how patient responds to this injection.  Patient will check if any other work restrictions are necessary.  We will fill out appropriately.  Follow-up with me again in 6 to 8 weeks

## 2021-12-06 ENCOUNTER — Telehealth (INDEPENDENT_AMBULATORY_CARE_PROVIDER_SITE_OTHER): Payer: 59 | Admitting: Family Medicine

## 2021-12-06 ENCOUNTER — Encounter: Payer: Self-pay | Admitting: Family Medicine

## 2021-12-06 ENCOUNTER — Ambulatory Visit: Payer: 59 | Admitting: Family Medicine

## 2021-12-06 DIAGNOSIS — G4733 Obstructive sleep apnea (adult) (pediatric): Secondary | ICD-10-CM

## 2021-12-06 DIAGNOSIS — G43709 Chronic migraine without aura, not intractable, without status migrainosus: Secondary | ICD-10-CM | POA: Diagnosis not present

## 2021-12-06 MED ORDER — PROPRANOLOL HCL 20 MG PO TABS: 20.0000 mg | ORAL_TABLET | Freq: Every day | ORAL | 3 refills | Status: DC

## 2021-12-06 MED ORDER — NURTEC 75 MG PO TBDP: 1.0000 | ORAL_TABLET | ORAL | 5 refills | Status: DC | PRN

## 2021-12-06 MED ORDER — EMGALITY 120 MG/ML ~~LOC~~ SOAJ: 120.0000 mg | SUBCUTANEOUS | 3 refills | Status: DC

## 2021-12-06 NOTE — Patient Instructions (Signed)
Below is our plan:  We will continue Emgality every 30 days, propranolol 20mg  at bedtime and Nurtec or rizatriptan as needed. Please call to shedule follow up with your sleep provider.   Please make sure you are staying well hydrated. I recommend 50-60 ounces daily. Well balanced diet and regular exercise encouraged. Consistent sleep schedule with 6-8 hours recommended.   Please continue follow up with care team as directed.   Follow up with me in 1 year  You may receive a survey regarding today's visit. I encourage you to leave honest feed back as I do use this information to improve patient care. Thank you for seeing me today!

## 2021-12-07 ENCOUNTER — Telehealth: Payer: Self-pay | Admitting: Neurology

## 2021-12-07 NOTE — Telephone Encounter (Signed)
PA completed for pt on [facility] KEY: [id number] Will await determination  PA approved for the patient through [facility] 12/07/2021 - 12/08/2022

## 2021-12-07 NOTE — Addendum Note (Signed)
Addended by: Darleen Crocker on: 12/07/2021 11:29 AM   Modules accepted: Orders, Level of Service

## 2021-12-07 NOTE — Telephone Encounter (Signed)
This encounter was created in error - please disregard.

## 2021-12-07 NOTE — Telephone Encounter (Signed)
PA completed for pt on [employer] KEY: [id number] Will await determination

## 2021-12-08 ENCOUNTER — Telehealth: Payer: Self-pay | Admitting: Internal Medicine

## 2021-12-08 ENCOUNTER — Telehealth: Payer: Self-pay

## 2021-12-08 NOTE — Telephone Encounter (Signed)
Received a denial for patients MRI stating that he needs an X-ray taken after patients symptoms started or changes that show further imaging is needed.   Patients last x ray was a year ago and the recent US are not good enough.   Can we get another order for patient to get a more current knee x-ray?

## 2021-12-08 NOTE — Telephone Encounter (Signed)
Pt called to report having hiccups for 3 days. Pt said he called the nurse on call last night and tried what she suggested but it did not work. Pt says the only thing he can think of is he had some acid reflux in the morning. He has not eaten anything spicy. Pt currently takes Omeprazole 40 mg, one in the morning and one at night. Any suggestions to stop the hiccups? Call pt [phone]

## 2021-12-11 ENCOUNTER — Other Ambulatory Visit: Payer: Self-pay

## 2021-12-11 DIAGNOSIS — G8929 Other chronic pain: Secondary | ICD-10-CM

## 2021-12-11 MED ORDER — CHLORPROMAZINE HCL 25 MG PO TABS: 25.0000 mg | ORAL_TABLET | Freq: Three times a day (TID) | ORAL | 0 refills | Status: AC | PRN

## 2021-12-11 NOTE — Telephone Encounter (Signed)
[place] for thorazine - I have done erx

## 2021-12-11 NOTE — Telephone Encounter (Signed)
Noted  

## 2021-12-12 ENCOUNTER — Telehealth: Payer: Self-pay | Admitting: *Deleted

## 2021-12-12 NOTE — Telephone Encounter (Signed)
RX sent by provider 12/11/21 

## 2021-12-12 NOTE — Telephone Encounter (Signed)
R knee Visco initiated through portals.

## 2021-12-13 NOTE — Telephone Encounter (Signed)
Prior auth completed & faxed to [facility].

## 2021-12-15 NOTE — Telephone Encounter (Signed)
Pt is currently scheduled for MRI on 11/19 but the MRI approval is pending until the pt comes in & gets an x-ray. Please call pt to remind him this needs to be done ASAP so we can receive approval before 11/19.

## 2021-12-15 NOTE — Telephone Encounter (Signed)
Patient called stating that he will come on Monday to have xrays done.

## 2021-12-18 ENCOUNTER — Telehealth: Payer: Self-pay | Admitting: Family Medicine

## 2021-12-18 ENCOUNTER — Encounter: Payer: Self-pay | Admitting: Internal Medicine

## 2021-12-18 ENCOUNTER — Ambulatory Visit (INDEPENDENT_AMBULATORY_CARE_PROVIDER_SITE_OTHER): Payer: 59

## 2021-12-18 ENCOUNTER — Ambulatory Visit (INDEPENDENT_AMBULATORY_CARE_PROVIDER_SITE_OTHER): Payer: 59 | Admitting: Internal Medicine

## 2021-12-18 VITALS — BP 140/82 | HR 72 | Temp 98.1°F | Ht 68.0 in | Wt 207.0 lb

## 2021-12-18 DIAGNOSIS — K219 Gastro-esophageal reflux disease without esophagitis: Secondary | ICD-10-CM

## 2021-12-18 DIAGNOSIS — I10 Essential (primary) hypertension: Secondary | ICD-10-CM

## 2021-12-18 DIAGNOSIS — K2970 Gastritis, unspecified, without bleeding: Secondary | ICD-10-CM

## 2021-12-18 DIAGNOSIS — E119 Type 2 diabetes mellitus without complications: Secondary | ICD-10-CM | POA: Diagnosis not present

## 2021-12-18 DIAGNOSIS — G8929 Other chronic pain: Secondary | ICD-10-CM | POA: Diagnosis not present

## 2021-12-18 DIAGNOSIS — M25561 Pain in right knee: Secondary | ICD-10-CM

## 2021-12-18 MED ORDER — PANTOPRAZOLE SODIUM 40 MG PO TBEC: 40.0000 mg | DELAYED_RELEASE_TABLET | Freq: Two times a day (BID) | ORAL | 0 refills | Status: DC

## 2021-12-18 MED ORDER — PANTOPRAZOLE SODIUM 40 MG PO TBEC: 40.0000 mg | DELAYED_RELEASE_TABLET | Freq: Every day | ORAL | 3 refills | Status: DC

## 2021-12-18 MED ORDER — SUCRALFATE 1 G PO TABS: 1.0000 g | ORAL_TABLET | Freq: Three times a day (TID) | ORAL | 0 refills | Status: DC

## 2021-12-18 NOTE — Assessment & Plan Note (Signed)
With hx of borderline gastric emptying, now on higher dose ozempic as well with recurring symtpoms; for carafate 1 gm qid x 1 mo, Refer GI Dr Russella Dar as pt did not make f/u appt after testing 2022

## 2021-12-18 NOTE — Assessment & Plan Note (Signed)
Uncontrolled, ok for increased protonix 40 bid x 1 mo, then 1 qd after

## 2021-12-18 NOTE — Patient Instructions (Addendum)
Ok to increase the protonix to 40 mg twice per day  Please take all new medication as prescribed - the carafate for 1 month only  Please continue all other medications as before, and refills have been done if requested.  Please have the pharmacy call with any other refills you may need.  Please continue your efforts at being more active, low cholesterol diet, and weight control.  Please keep your appointments with your specialists as you may have planned  You will be contacted regarding the referral for: GI - Dr Russella Dar  Please make an Appointment to return in Mar 01 2022 as planned, or sooner if needed

## 2021-12-18 NOTE — Telephone Encounter (Signed)
Pt request refill for Galcanezumab-gnlm (EMGALITY) 120 MG[facility]ML [facility]AJ [facility] [phone]

## 2021-12-18 NOTE — Assessment & Plan Note (Signed)
Lab Results  Component Value Date   HGBA1C 7.1 (H) 10/13/2021   Stable, pt to continue current medical treatment farxiga 10 mg, metformin 1000 bid, ozempic 2 mg - and f/u endo

## 2021-12-18 NOTE — Progress Notes (Signed)
Patient ID: Wallis Vancott, male   DOB: December 28, 1965, 56 y.o.   MRN: [id number]        Chief Complaint: follow up GERD, gastritis, htn, dm       HPI:  Mavrik Bynum is a 56 y.o. male here overall doing ok, but with 3-4 days onset epigastric pain and reflux, similar it seems to prior episode mar 2022 when he saw GI Dr Fuller Plan with EGD. Had some dysphagia before, but this time started with hiccups, seen at [facility] and prilosec 40 mg changed to protonix 69m, but has seemed to help only mildly, still with epigastric pain mild constant last 2-3 days.  Also had f/u gastric empyting study 2022 with borderline decreased gastric emptying.  Pt denies chest pain, increased sob or doe, wheezing, orthopnea, PND, increased LE swelling, palpitations, dizziness or syncope.  . Pt denies polydipsia, polyuria, or new focal neuro s/s.    Pt denies fever, wt loss, night sweats, loss of appetite, or other constitutional symptoms       Wt Readings from Last 3 Encounters:  12/18/21 207 lb (93.9 kg)  12/05/21 208 lb (94.3 kg)  10/24/21 205 lb 6.4 oz (93.2 kg)   BP Readings from Last 3 Encounters:  12/18/21 (!) 140/82  12/05/21 (!) 148/102  10/24/21 (!) 144/82         Past Medical History:  Diagnosis Date   Allergy    Anemia    Arthritis    Cataract    bilateral cataracts removed   Depression    Diabetes mellitus    Frequent headaches    GERD (gastroesophageal reflux disease)    Glaucoma    Hypercholesteremia    Hypertension    Migraines    Sleep apnea    wears c-PAP   Past Surgical History:  Procedure Laterality Date   bilateral cataracts removed     CARDIAC CATHETERIZATION  12/2011   COLONOSCOPY     LEFT HEART CATHETERIZATION WITH CORONARY ANGIOGRAM N/A 11/12/2011   Procedure: LEFT HEART CATHETERIZATION WITH CORONARY ANGIOGRAM;  Surgeon: JLaverda Page MD;  [facility];  Service: Cardiovascular;  Laterality: N/A;   SHOULDER SURGERY      reports that he has never smoked. He has never used  smokeless tobacco. He reports current alcohol use. He reports that he does not use drugs. family history includes Arthritis in his mother; Cancer in his father; Diabetes in his father and maternal grandfather; Hypertension in his father; Stroke in his mother. Allergies  Allergen Reactions   Pollen Extract Other (See Comments)    Stuffy nose   Current Outpatient Medications on File Prior to Visit  Medication Sig Dispense Refill   aspirin EC 325 MG tablet Take 1 tablet (325 mg total) by mouth daily. 30 tablet 99   atorvastatin (LIPITOR) 40 MG tablet TAKE 1 TABLET BY MOUTH EVERY DAY 90 tablet 3   baclofen (LIORESAL) 10 MG tablet Take 1 tablet (10 mg total) by mouth 3 (three) times daily. Prn for IBS flare 30 each 0   Blood Glucose Monitoring Suppl (ONETOUCH VERIO) w/Device KIT 1 Device by Does not apply route 2 (two) times daily as needed. (Patient taking differently: 1 Device by Does not apply route 2 (two) times daily. E11.9) 1 kit 0   chlorproMAZINE (THORAZINE) 25 MG tablet Take 1 tablet (25 mg total) by mouth 3 (three) times daily as needed. 30 tablet 0   dapagliflozin propanediol (FARXIGA) 10 MG TABS tablet Take 1 tablet (10 mg  total) by mouth daily. 90 tablet 1   diclofenac sodium (VOLTAREN) 1 % GEL Apply 2 g topically 4 (four) times daily. 200 g 5   dicyclomine (BENTYL) 10 MG capsule TAKE 1 CAPSULE (10 MG TOTAL) BY MOUTH 4 (FOUR) TIMES DAILY - BEFORE MEALS AND AT BEDTIME. 90 capsule 3   gabapentin (NEURONTIN) 300 MG capsule Take 1 capsule (300 mg total) by mouth at bedtime. 90 capsule 0   Galcanezumab-gnlm (EMGALITY) 120 MG/ML SOAJ Inject 120 mg into the skin every 30 (thirty) days. 3 mL 3   glucose blood (ONETOUCH VERIO) test strip 1 each by Other route 2 (two) times daily. E11.9     ibuprofen (ADVIL,MOTRIN) 600 MG tablet TAKE 1 TABLET(600 MG) BY MOUTH EVERY 8 HOURS AS NEEDED 30 tablet 0   losartan-hydrochlorothiazide (HYZAAR) 100-25 MG tablet TAKE 1 TABLET BY MOUTH EVERY DAY 90 tablet 3    meloxicam (MOBIC) 15 MG tablet TAKE 1 TABLET (15 MG TOTAL) BY MOUTH DAILY. 30 tablet 5   metFORMIN (GLUCOPHAGE) 1000 MG tablet Take 1 tablet (1,000 mg total) by mouth 2 (two) times daily with a meal. 180 tablet 3   methocarbamol (ROBAXIN) 500 MG tablet TAKE 1 TABLET BY MOUTH TWICE DAILY (Patient taking differently: Take 500 mg by mouth every 6 (six) hours as needed.) 20 tablet 0   OneTouch Delica Lancets 29J MISC 1 each by Does not apply route 2 (two) times daily. E11.9     PENNSAID 2 % SOLN SMARTSIG:2 Pump Topical Twice Daily     propranolol (INDERAL) 20 MG tablet Take 1 tablet (20 mg total) by mouth at bedtime. 90 tablet 3   Rimegepant Sulfate (NURTEC) 75 MG TBDP Take 1 tablet by mouth as needed (Take 1 tablet at onset of migraine ( Do not exceed more than 1 tablet in 24 hours)). 8 tablet 5   rizatriptan (MAXALT-MLT) 10 MG disintegrating tablet Take 1 tablet (10 mg total) by mouth as needed for migraine. May repeat in 2 hours if needed 9 tablet 11   Semaglutide, 2 MG/DOSE, (OZEMPIC, 2 MG/DOSE,) 8 MG/3ML SOPN INJECT 2 MG INTO THE SKIN ONCE A WEEK. 9 mL 1   sildenafil (REVATIO) 20 MG tablet Take 20 mg by mouth. 3-5 tabs prn     timolol (TIMOPTIC-XR) 0.5 % ophthalmic gel-forming Place 1 drop into both eyes daily.      verapamil (CALAN SR) 240 MG CR tablet Take 1 tablet (240 mg total) by mouth at bedtime. 90 tablet 3   No current facility-administered medications on file prior to visit.        ROS:  All others reviewed and negative.  Objective        PE:  BP (!) 140/82   Pulse 72   Temp 98.1 F (36.7 C) (Oral)   Ht _0  (1.727 m)   Wt 207 lb (93.9 kg)   SpO2 99%   BMI 31.47 kg/m                 Constitutional: Pt appears in NAD               HENT: Head: NCAT.                Right Ear: External ear normal.                 Left Ear: External ear normal.                Eyes: . Pupils are  equal, round, and reactive to light. Conjunctivae and EOM are normal               Nose: without d/c  or deformity               Neck: Neck supple. Gross normal ROM               Cardiovascular: Normal rate and regular rhythm.                 Pulmonary/Chest: Effort normal and breath sounds without rales or wheezing.                Abd:  Soft, NT, ND, + BS, no organomegaly               Neurological: Pt is alert. At baseline orientation, motor grossly intact               Skin: Skin is warm. No rashes, no other new lesions, LE edema - none               Psychiatric: Pt behavior is normal without agitation   Micro: none  Cardiac tracings I have personally interpreted today:  none  Pertinent Radiological findings (summarize): none   Lab Results  Component Value Date   WBC 4.0 02/27/2021   HGB 13.6 02/27/2021   HCT 43.6 02/27/2021   PLT 240.0 02/27/2021   GLUCOSE 111 (H) 10/13/2021   CHOL 135 02/27/2021   TRIG 59.0 02/27/2021   HDL 59.40 02/27/2021   LDLCALC 64 02/27/2021   ALT 27 02/27/2021   AST 23 02/27/2021   NA 138 10/13/2021   K 3.8 10/13/2021   CL 100 10/13/2021   CREATININE 1.23 10/13/2021   BUN 16 10/13/2021   CO2 33 (H) 10/13/2021   TSH 0.81 02/27/2021   PSA 0.89 02/27/2021   HGBA1C 7.1 (H) 10/13/2021   MICROALBUR 0.7 02/27/2021   Assessment/Plan:  Brayln Duque is a 56 y.o. Black or African American [2] male with  has a past medical history of Allergy, Anemia, Arthritis, Cataract, Depression, Diabetes mellitus, Frequent headaches, GERD (gastroesophageal reflux disease), Glaucoma, Hypercholesteremia, Hypertension, Migraines, and Sleep apnea.  Type 2 diabetes mellitus without complication, without long-term current use of insulin (HCC) Lab Results  Component Value Date   HGBA1C 7.1 (H) 10/13/2021   Stable, pt to continue current medical treatment farxiga 10 mg, metformin 1000 bid, ozempic 2 mg - and f/u endo   Hypertension, uncontrolled BP Readings from Last 3 Encounters:  12/18/21 (!) 140/82  12/05/21 (!) 148/102  10/24/21 (!) 144/82   Borderline  controlled today, ? Reactive, declines change in med tx, pt to continue medical treatment calan SR 240 qd   Gastroesophageal reflux disease without esophagitis Uncontrolled, ok for increased protonix 40 bid x 1 mo, then 1 qd after  Gastritis With hx of borderline gastric emptying, now on higher dose ozempic as well with recurring symtpoms; for carafate 1 gm qid x 1 mo, Refer GI Dr Fuller Plan as pt did not make f/u appt after testing 2022  Followup: Return in about 2 months (around 03/01/2022).  Cathlean Cower, MD 12/18/2021 8:47 AM [facility]

## 2021-12-18 NOTE — Assessment & Plan Note (Signed)
BP Readings from Last 3 Encounters:  12/18/21 (!) 140/82  12/05/21 (!) 148/102  10/24/21 (!) 144/82   Borderline controlled today, ? Reactive, declines change in med tx, pt to continue medical treatment calan SR 240 qd

## 2021-12-19 ENCOUNTER — Telehealth: Payer: Self-pay

## 2021-12-19 NOTE — Telephone Encounter (Signed)
Patient will stop by and pick up sample of Ozempic.

## 2021-12-20 NOTE — Telephone Encounter (Signed)
Patient picked up sample of Ozempic in office today. 

## 2021-12-24 ENCOUNTER — Ambulatory Visit
Admission: RE | Admit: 2021-12-24 | Discharge: 2021-12-24 | Disposition: A | Discharge status: Home / Self Care | Payer: 59 | Source: Ambulatory Visit | Attending: Family Medicine | Admitting: Family Medicine

## 2021-12-24 DIAGNOSIS — G8929 Other chronic pain: Secondary | ICD-10-CM

## 2022-01-04 ENCOUNTER — Other Ambulatory Visit: Payer: Self-pay

## 2022-01-04 ENCOUNTER — Other Ambulatory Visit: Payer: Self-pay | Admitting: Family Medicine

## 2022-01-04 ENCOUNTER — Other Ambulatory Visit: Payer: Self-pay | Admitting: Internal Medicine

## 2022-01-04 MED ORDER — MELOXICAM 15 MG PO TABS: 15.0000 mg | ORAL_TABLET | Freq: Every day | ORAL | 5 refills | Status: DC

## 2022-01-04 MED ORDER — GABAPENTIN 300 MG PO CAPS: 300.0000 mg | ORAL_CAPSULE | Freq: Every day | ORAL | 0 refills | Status: DC

## 2022-01-04 NOTE — Telephone Encounter (Signed)
Please refill as per office routine med refill policy (all routine meds to be refilled for 3 mo or monthly (per pt preference) up to one year from last visit, then month to month grace period for 3 mo, then further med refills will have to be denied) ? ?

## 2022-01-19 NOTE — Progress Notes (Unsigned)
[facility] [place] Phone: [phone] Subjective:   Jason Ortega, am serving as a scribe for Dr. Hulan Saas.  I'm seeing this patient by the request  of:  Biagio Borg, MD  CC: Right knee pain follow-up  [id number]  12/05/2021 Patient given injection and tolerated the procedure well.  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  This was 2 years ago.  Patient has made improvement in the calcific tendinitis after the shockwave therapy but continues to have some discomfort.  Increases instability.  Affecting job performance.  Given injection so patient can continue working but I do feel that the majority of this we should consider evaluate.  I do think patient could be a candidate for discussed with patient.   After approval for this is well depending on how patient responds to this injection.  Patient will check if any other work restrictions are necessary.  We will fill out appropriately.  Follow-up with me again in 6 to 8 weeks     Update 01/24/2022 Jason Ortega is a 56 y.o. male coming in with complaint of R knee pain.  Continue to have difficulty with pain noted.  Patient was sent for an MRI of the right knee again.  MRI was independently visualized by me showing a posterior root attachment of the medial meniscus with an ill-defined likely tear tendinosis of the distal quadricep noted with low-grade partial-thickness tearing had some marrow edema of the superior pole of the patella patient states that the injection helped last visit. At times he has pain but feels this is due to the weather.        Past Medical History:  Diagnosis Date   Allergy    Anemia    Arthritis    Cataract    bilateral cataracts removed   Depression    Diabetes mellitus    Frequent headaches    GERD (gastroesophageal reflux disease)    Glaucoma    Hypercholesteremia     Hypertension    Migraines    Sleep apnea    wears c-PAP   Past Surgical History:  Procedure Laterality Date   bilateral cataracts removed     CARDIAC CATHETERIZATION  12/2011   COLONOSCOPY     LEFT HEART CATHETERIZATION WITH CORONARY ANGIOGRAM N/A 11/12/2011   Procedure: LEFT HEART CATHETERIZATION WITH CORONARY ANGIOGRAM;  Surgeon: Laverda Page, MD;  Location: [facility];  Service: Cardiovascular;  Laterality: N/A;   SHOULDER SURGERY     Social History   Socioeconomic History   Marital status: Single    Spouse name: Not on file   Number of children: 2   Years of education: 61   Highest education level: Not on file  Occupational History   Occupation: Stocker  Tobacco Use   Smoking status: Never   Smokeless tobacco: Never  Vaping Use   Vaping Use: Never used  Substance and Sexual Activity   Alcohol use: Yes    Comment: occasional   Drug use: Ortega   Sexual activity: Yes    Birth control/protection: None  Other Topics Concern   Not on file  Social History Narrative   Fun/Hobby: Leisure centre manager football   Social Determinants of Health   Financial Resource Strain: Not on file  Food Insecurity: Not on file  Transportation Needs: Not on file  Physical Activity: Not on file  Stress: Not on file  Social  Connections: Not on file   Allergies  Allergen Reactions   Pollen Extract Other (See Comments)    Stuffy nose   Family History  Problem Relation Age of Onset   Arthritis Mother    Stroke Mother    Hypertension Father    Diabetes Father    Cancer Father        Multiple Myleoma   Diabetes Maternal Grandfather    Colon cancer Neg Hx    Esophageal cancer Neg Hx    Rectal cancer Neg Hx    Stomach cancer Neg Hx     Current Outpatient Medications (Endocrine & Metabolic):    dapagliflozin propanediol (FARXIGA) 10 MG TABS tablet, Take 1 tablet (10 mg total) by mouth daily.   metFORMIN (GLUCOPHAGE) 1000 MG tablet, Take 1 tablet (1,000 mg total) by mouth 2 (two) times  daily with a meal.   Semaglutide, 2 MG/DOSE, (OZEMPIC, 2 MG/DOSE,) 8 MG/3ML SOPN, INJECT 2 MG INTO THE SKIN ONCE A WEEK.  Current Outpatient Medications (Cardiovascular):    atorvastatin (LIPITOR) 40 MG tablet, TAKE 1 TABLET BY MOUTH EVERY DAY   losartan-hydrochlorothiazide (HYZAAR) 100-25 MG tablet, TAKE 1 TABLET BY MOUTH EVERY DAY   propranolol (INDERAL) 20 MG tablet, Take 1 tablet (20 mg total) by mouth at bedtime.   sildenafil (REVATIO) 20 MG tablet, Take 20 mg by mouth. 3-5 tabs prn   verapamil (CALAN SR) 240 MG CR tablet, Take 1 tablet (240 mg total) by mouth at bedtime.   Current Outpatient Medications (Analgesics):    aspirin EC 325 MG tablet, Take 1 tablet (325 mg total) by mouth daily.   Galcanezumab-gnlm (EMGALITY) 120 MG/ML SOAJ, Inject 120 mg into the skin every 30 (thirty) days.   ibuprofen (ADVIL,MOTRIN) 600 MG tablet, TAKE 1 TABLET(600 MG) BY MOUTH EVERY 8 HOURS AS NEEDED   meloxicam (MOBIC) 15 MG tablet, Take 1 tablet (15 mg total) by mouth daily.   Rimegepant Sulfate (NURTEC) 75 MG TBDP, Take 1 tablet by mouth as needed (Take 1 tablet at onset of migraine ( Do not exceed more than 1 tablet in 24 hours)).   rizatriptan (MAXALT-MLT) 10 MG disintegrating tablet, Take 1 tablet (10 mg total) by mouth as needed for migraine. May repeat in 2 hours if needed   Current Outpatient Medications (Other):    baclofen (LIORESAL) 10 MG tablet, Take 1 tablet (10 mg total) by mouth 3 (three) times daily. Prn for IBS flare   Blood Glucose Monitoring Suppl (ONETOUCH VERIO) w/Device KIT, 1 Device by Does not apply route 2 (two) times daily as needed. (Patient taking differently: 1 Device by Does not apply route 2 (two) times daily. E11.9)   chlorproMAZINE (THORAZINE) 25 MG tablet, Take 1 tablet (25 mg total) by mouth 3 (three) times daily as needed.   diclofenac sodium (VOLTAREN) 1 % GEL, Apply 2 g topically 4 (four) times daily.   dicyclomine (BENTYL) 10 MG capsule, TAKE 1 CAPSULE (10 MG  TOTAL) BY MOUTH 4 (FOUR) TIMES DAILY - BEFORE MEALS AND AT BEDTIME.   gabapentin (NEURONTIN) 300 MG capsule, Take 1 capsule (300 mg total) by mouth at bedtime.   glucose blood (ONETOUCH VERIO) test strip, 1 each by Other route 2 (two) times daily. E11.9   methocarbamol (ROBAXIN) 500 MG tablet, TAKE 1 TABLET BY MOUTH TWICE DAILY (Patient taking differently: Take 500 mg by mouth every 6 (six) hours as needed.)   OneTouch Delica Lancets 89Q MISC, 1 each by Does not apply route 2 (two) times  daily. E11.9   pantoprazole (PROTONIX) 40 MG tablet, Take 1 tablet (40 mg total) by mouth daily.   pantoprazole (PROTONIX) 40 MG tablet, TAKE 1 TABLET BY MOUTH TWICE A DAY   PENNSAID 2 % SOLN, SMARTSIG:2 Pump Topical Twice Daily   sucralfate (CARAFATE) 1 g tablet, Take 1 tablet (1 g total) by mouth 4 (four) times daily -  with meals and at bedtime.   timolol (TIMOPTIC-XR) 0.5 % ophthalmic gel-forming, Place 1 drop into both eyes daily.     Review of Systems:  Ortega headache, visual changes, nausea, vomiting, diarrhea, constipation, dizziness, abdominal pain, skin rash, fevers, chills, night sweats, weight loss, swollen lymph nodes, body aches, joint swelling, chest pain, shortness of breath, mood changes.  Objective  Blood pressure (!) 132/92, pulse 68, height _0  (1.727 m), weight 207 lb (93.9 kg), SpO2 98 %.   General: Ortega apparent distress alert and oriented x3 mood and affect normal, dressed appropriately.  HEENT: Pupils equal, extraocular movements intact  Respiratory: Patient's speak in full sentences and does not appear short of breath  Cardiovascular: Ortega lower extremity edema, non tender, Ortega erythema  Right knee exam shows good range of motion noted.  Very mild crepitus noted.  Patient has good stability of the knee.  Limited muscular skeletal ultrasound was performed and interpreted by Hulan Saas, M  Limited ultrasound shows Ortega hypoechoic changes of the patellofemoral joint but does still have  the narrowing noted.  Patient still has some mild calcific changes noted of the quadricep tendon.  Very mild increase in neovascularization.    Impression and Recommendations:     The above documentation has been reviewed and is accurate and complete Lyndal Pulley, DO

## 2022-01-24 ENCOUNTER — Encounter: Payer: Self-pay | Admitting: Family Medicine

## 2022-01-24 ENCOUNTER — Ambulatory Visit (INDEPENDENT_AMBULATORY_CARE_PROVIDER_SITE_OTHER): Payer: 59 | Admitting: Family Medicine

## 2022-01-24 ENCOUNTER — Ambulatory Visit: Payer: Self-pay

## 2022-01-24 VITALS — BP 132/92 | HR 68 | Ht 68.0 in | Wt 207.0 lb

## 2022-01-24 DIAGNOSIS — M94261 Chondromalacia, right knee: Secondary | ICD-10-CM

## 2022-01-24 DIAGNOSIS — G8929 Other chronic pain: Secondary | ICD-10-CM

## 2022-01-24 DIAGNOSIS — M25561 Pain in right knee: Secondary | ICD-10-CM | POA: Diagnosis not present

## 2022-01-24 NOTE — Assessment & Plan Note (Signed)
Patient responded extremely well to the injection previously.  No significant swelling in the knee itself and has turned down the majority of his pain which is likely from the chondromalacia.  Does still have the calcific changes of the quadricep tendon and can consider other more aggressive therapies if needed but at the moment patient doing 95% good and can follow-up with me as needed

## 2022-01-24 NOTE — Patient Instructions (Signed)
Always potential for arthroscopic surgery but I do not think necessary Consider graston tool for superficial calcium Stay active See me when you need me Happy Holidays!

## 2022-01-30 ENCOUNTER — Other Ambulatory Visit: Payer: Self-pay | Admitting: Internal Medicine

## 2022-02-20 ENCOUNTER — Other Ambulatory Visit: Payer: 59

## 2022-03-01 ENCOUNTER — Ambulatory Visit: Payer: 59 | Admitting: Internal Medicine

## 2022-03-02 ENCOUNTER — Ambulatory Visit: Payer: 59 | Admitting: Endocrinology

## 2022-03-06 ENCOUNTER — Ambulatory Visit: Payer: 59 | Admitting: Internal Medicine

## 2022-03-07 ENCOUNTER — Other Ambulatory Visit: Payer: Self-pay | Admitting: Internal Medicine

## 2022-03-26 ENCOUNTER — Encounter: Payer: Self-pay | Admitting: Gastroenterology

## 2022-03-26 ENCOUNTER — Ambulatory Visit: Payer: 59 | Admitting: Internal Medicine

## 2022-03-26 ENCOUNTER — Encounter: Payer: Self-pay | Admitting: Internal Medicine

## 2022-03-26 ENCOUNTER — Ambulatory Visit: Payer: 59 | Admitting: Gastroenterology

## 2022-03-26 VITALS — BP 138/84 | HR 77 | Ht 68.0 in | Wt 204.0 lb

## 2022-03-26 VITALS — BP 118/70 | HR 75 | Temp 98.1°F | Ht 68.0 in | Wt 204.0 lb

## 2022-03-26 DIAGNOSIS — Z0001 Encounter for general adult medical examination with abnormal findings: Secondary | ICD-10-CM

## 2022-03-26 DIAGNOSIS — E538 Deficiency of other specified B group vitamins: Secondary | ICD-10-CM

## 2022-03-26 DIAGNOSIS — K2100 Gastro-esophageal reflux disease with esophagitis, without bleeding: Secondary | ICD-10-CM | POA: Diagnosis not present

## 2022-03-26 DIAGNOSIS — R1013 Epigastric pain: Secondary | ICD-10-CM

## 2022-03-26 DIAGNOSIS — E559 Vitamin D deficiency, unspecified: Secondary | ICD-10-CM

## 2022-03-26 DIAGNOSIS — E119 Type 2 diabetes mellitus without complications: Secondary | ICD-10-CM

## 2022-03-26 DIAGNOSIS — I10 Essential (primary) hypertension: Secondary | ICD-10-CM

## 2022-03-26 DIAGNOSIS — E785 Hyperlipidemia, unspecified: Secondary | ICD-10-CM | POA: Diagnosis not present

## 2022-03-26 DIAGNOSIS — Z23 Encounter for immunization: Secondary | ICD-10-CM

## 2022-03-26 DIAGNOSIS — Z125 Encounter for screening for malignant neoplasm of prostate: Secondary | ICD-10-CM

## 2022-03-26 LAB — CBC WITH DIFFERENTIAL/PLATELET
Basophils Absolute: 0 10*3/uL (ref 0.0–0.1)
Basophils Relative: 0.6 % (ref 0.0–3.0)
Eosinophils Absolute: 0.1 10*3/uL (ref 0.0–0.7)
Eosinophils Relative: 1.7 % (ref 0.0–5.0)
HCT: 41.7 % (ref 39.0–52.0)
Hemoglobin: 13.4 g/dL (ref 13.0–17.0)
Lymphocytes Relative: 33.6 % (ref 12.0–46.0)
Lymphs Abs: 2 10*3/uL (ref 0.7–4.0)
MCHC: 32.2 g/dL (ref 30.0–36.0)
MCV: 81.4 fl (ref 78.0–100.0)
Monocytes Absolute: 0.6 10*3/uL (ref 0.1–1.0)
Monocytes Relative: 10.8 % (ref 3.0–12.0)
Neutro Abs: 3.1 10*3/uL (ref 1.4–7.7)
Neutrophils Relative %: 53.3 % (ref 43.0–77.0)
Platelets: 302 10*3/uL (ref 150.0–400.0)
RBC: 5.13 Mil/uL (ref 4.22–5.81)
RDW: 16.3 % — ABNORMAL HIGH (ref 11.5–15.5)
WBC: 5.9 10*3/uL (ref 4.0–10.5)

## 2022-03-26 LAB — URINALYSIS, ROUTINE W REFLEX MICROSCOPIC
Bilirubin Urine: NEGATIVE
Hgb urine dipstick: NEGATIVE
Ketones, ur: NEGATIVE
Leukocytes,Ua: NEGATIVE
Nitrite: NEGATIVE
RBC / HPF: NONE SEEN (ref 0–?)
Specific Gravity, Urine: 1.02 (ref 1.000–1.030)
Total Protein, Urine: NEGATIVE
Urine Glucose: >=1000 — AB
Urobilinogen, UA: 0.2 (ref 0.0–1.0)
pH: 6 (ref 5.0–8.0)

## 2022-03-26 LAB — LIPID PANEL
Cholesterol: 152 mg/dL (ref 0–200)
HDL: 63 mg/dL (ref >39.00)
LDL Cholesterol: 66 mg/dL (ref 0–99)
NonHDL: 88.93
Total CHOL/HDL Ratio: 2
Triglycerides: 113 mg/dL (ref 0.0–149.0)
VLDL: 22.6 mg/dL (ref 0.0–40.0)

## 2022-03-26 LAB — HEPATIC FUNCTION PANEL
ALT: 19 U/L (ref 0–53)
AST: 19 U/L (ref 0–37)
Albumin: 4.2 g/dL (ref 3.5–5.2)
Alkaline Phosphatase: 49 U/L (ref 39–117)
Bilirubin, Direct: 0.1 mg/dL (ref 0.0–0.3)
Total Bilirubin: 0.4 mg/dL (ref 0.2–1.2)
Total Protein: 7.5 g/dL (ref 6.0–8.3)

## 2022-03-26 LAB — BASIC METABOLIC PANEL
BUN: 13 mg/dL (ref 6–23)
CO2: 32 mEq/L (ref 19–32)
Calcium: 9.8 mg/dL (ref 8.4–10.5)
Chloride: 98 mEq/L (ref 96–112)
Creatinine, Ser: 1.02 mg/dL (ref 0.40–1.50)
GFR: 81.05 mL/min (ref >60.00)
Glucose, Bld: 91 mg/dL (ref 70–99)
Potassium: 4 mEq/L (ref 3.5–5.1)
Sodium: 138 mEq/L (ref 135–145)

## 2022-03-26 LAB — VITAMIN B12: Vitamin B-12: 262 pg/mL (ref 211–911)

## 2022-03-26 LAB — HEMOGLOBIN A1C: Hgb A1c MFr Bld: 6.6 % — ABNORMAL HIGH (ref 4.6–6.5)

## 2022-03-26 LAB — TSH: TSH: 1.09 u[IU]/mL (ref 0.35–5.50)

## 2022-03-26 LAB — PSA: PSA: 0.86 ng/mL (ref 0.10–4.00)

## 2022-03-26 LAB — VITAMIN D 25 HYDROXY (VIT D DEFICIENCY, FRACTURES): VITD: 23.09 ng/mL — ABNORMAL LOW (ref 30.00–100.00)

## 2022-03-26 MED ORDER — PANTOPRAZOLE SODIUM 40 MG PO TBEC: 40.0000 mg | DELAYED_RELEASE_TABLET | Freq: Two times a day (BID) | ORAL | 3 refills | Status: DC

## 2022-03-26 NOTE — Assessment & Plan Note (Signed)
Lab Results  Component Value Date   HGBA1C 7.1 (H) 10/13/2021   Mild uncontrolled,, pt does not want change, to continue current medical treatment farxiga 10 mg qd, metformin 1000 bid, ozemipc 2 mg weekly

## 2022-03-26 NOTE — Assessment & Plan Note (Signed)
Lab Results  Component Value Date   LDLCALC 64 02/27/2021   Stable, pt to continue current statin lipitor 40 mg qd

## 2022-03-26 NOTE — Assessment & Plan Note (Signed)
Age and sex appropriate education and counseling updated with regular exercise and diet Referrals for preventative services - none needed Immunizations addressed - for shingles #2 today Smoking counseling  - none needed Evidence for depression or other mood disorder - none significant Most recent labs reviewed. I have personally reviewed and have noted: 1) the patient's medical and social history 2) The patient's current medications and supplements 3) The patient's height, weight, and BMI have been recorded in the chart

## 2022-03-26 NOTE — Patient Instructions (Signed)
You had the Shingles shot #2 today  Please continue all other medications as before, and refills have been done if requested.  Please have the pharmacy call with any other refills you may need.  Please continue your efforts at being more active, low cholesterol diet, and weight control.  You are otherwise up to date with prevention measures today.  Please keep your appointments with your specialists as you may have planned - Endo soon  Please go to the LAB at the blood drawing area for the tests to be done  You will be contacted by phone if any changes need to be made immediately.  Otherwise, you will receive a letter about your results with an explanation, but please check with MyChart first.  Please remember to sign up for MyChart if you have not done so, as this will be important to you in the future with finding out test results, communicating by private email, and scheduling acute appointments online when needed.  Please make an Appointment to return in 6 months, or sooner if needed

## 2022-03-26 NOTE — Assessment & Plan Note (Signed)
BP Readings from Last 3 Encounters:  03/26/22 118/70  03/26/22 138/84  01/24/22 (!) 132/92   Stable, pt to continue medical treatment hyzaar 100 25 mg qd, inderal 20 qhs, calan SR 240 qd

## 2022-03-26 NOTE — Progress Notes (Signed)
Patient ID: Jason Ortega, male   DOB: [birth year], 57 y.o.   MRN: [id number]         Chief Complaint:: wellness exam and htn, hld, dm, low vit d       HPI:  Jason Ortega is a 57 y.o. male here for wellness exam; due for shingles #2 today, saw GI this with uncontrolled reflux now for increased omeprazole bid, Also to see Endo soon.  Pt denies chest pain, increased sob or doe, wheezing, orthopnea, PND, increased LE swelling, palpitations, dizziness or syncope.   Pt denies polydipsia, polyuria, or new focal neuro s/s.    Pt denies fever, wt loss, night sweats, loss of appetite, or other constitutional symptoms   Wt Readings from Last 3 Encounters:  03/26/22 204 lb (92.5 kg)  03/26/22 204 lb (92.5 kg)  01/24/22 207 lb (93.9 kg)   BP Readings from Last 3 Encounters:  03/26/22 118/70  03/26/22 138/84  01/24/22 (!) 132/92   Immunization History  Administered Date(s) Administered   Influenza,inj,Quad PF,6+ Mos 12/07/2016, 12/20/2017, 10/16/2018   Influenza-Unspecified 02/22/2020, 01/19/2021   PFIZER(Purple Top)SARS-COV-2 Vaccination 02/06/2019, 03/02/2019, 05/14/2019, 12/07/2019   Pneumococcal Conjugate-13 08/22/2018   Pneumococcal Polysaccharide-23 02/05/2013, 02/27/2021   Tdap 02/06/2012, 12/14/2020   Zoster Recombinat (Shingrix) 09/08/2021, 03/26/2022   There are no preventive care reminders to display for this patient.     Past Medical History:  Diagnosis Date   Allergy    Anemia    Arthritis    Cataract    bilateral cataracts removed   Depression    Diabetes mellitus    Frequent headaches    GERD (gastroesophageal reflux disease)    Glaucoma    Hypercholesteremia    Hypertension    Migraines    Sleep apnea    wears c-PAP   Past Surgical History:  Procedure Laterality Date   bilateral cataracts removed     CARDIAC CATHETERIZATION  12/2011   COLONOSCOPY     LEFT HEART CATHETERIZATION WITH CORONARY ANGIOGRAM N/A 11/12/2011   Procedure: LEFT HEART CATHETERIZATION WITH  CORONARY ANGIOGRAM;  Surgeon: Laverda Page, MD;  Location: [facility] CATH LAB;  Service: Cardiovascular;  Laterality: N/A;   SHOULDER SURGERY      reports that he has never smoked. He has never used smokeless tobacco. He reports current alcohol use. He reports that he does not use drugs. family history includes Arthritis in his mother; Cancer in his father; Diabetes in his father and maternal grandfather; Hypertension in his father; Stroke in his mother. Allergies  Allergen Reactions   Pollen Extract Other (See Comments)    Stuffy nose   Current Outpatient Medications on File Prior to Visit  Medication Sig Dispense Refill   aspirin EC 325 MG tablet Take 1 tablet (325 mg total) by mouth daily. 30 tablet 99   atorvastatin (LIPITOR) 40 MG tablet TAKE 1 TABLET BY MOUTH EVERY DAY 90 tablet 3   Blood Glucose Monitoring Suppl (ONETOUCH VERIO) w/Device KIT 1 Device by Does not apply route 2 (two) times daily as needed. (Patient taking differently: 1 Device by Does not apply route 2 (two) times daily. E11.9) 1 kit 0   Blood Pressure Monitoring (BLOOD PRESSURE DIGITAL SOLN) KIT      chlorproMAZINE (THORAZINE) 25 MG tablet Take 1 tablet (25 mg total) by mouth 3 (three) times daily as needed. 30 tablet 0   dapagliflozin propanediol (FARXIGA) 10 MG TABS tablet Take 1 tablet (10 mg total) by mouth daily. 90 tablet 1  diclofenac sodium (VOLTAREN) 1 % GEL Apply 2 g topically 4 (four) times daily. 200 g 5   gabapentin (NEURONTIN) 300 MG capsule Take 1 capsule (300 mg total) by mouth at bedtime. 90 capsule 0   Galcanezumab-gnlm (EMGALITY) 120 MG/ML SOAJ Inject 120 mg into the skin every 30 (thirty) days. 3 mL 3   glucose blood (ONETOUCH VERIO) test strip 1 each by Other route 2 (two) times daily. E11.9     ibuprofen (ADVIL,MOTRIN) 600 MG tablet TAKE 1 TABLET(600 MG) BY MOUTH EVERY 8 HOURS AS NEEDED 30 tablet 0   losartan-hydrochlorothiazide (HYZAAR) 100-25 MG tablet TAKE 1 TABLET BY MOUTH EVERY DAY 90 tablet 3    meloxicam (MOBIC) 15 MG tablet Take 1 tablet (15 mg total) by mouth daily. 30 tablet 5   metFORMIN (GLUCOPHAGE) 1000 MG tablet Take 1 tablet (1,000 mg total) by mouth 2 (two) times daily with a meal. 180 tablet 3   OneTouch Delica Lancets 99991111 MISC 1 each by Does not apply route 2 (two) times daily. E11.9     pantoprazole (PROTONIX) 40 MG tablet Take 1 tablet (40 mg total) by mouth daily. 90 tablet 3   pantoprazole (PROTONIX) 40 MG tablet Take 1 tablet (40 mg total) by mouth 2 (two) times daily. 180 tablet 3   PENNSAID 2 % SOLN SMARTSIG:2 Pump Topical Twice Daily     propranolol (INDERAL) 20 MG tablet Take 1 tablet (20 mg total) by mouth at bedtime. 90 tablet 3   Rimegepant Sulfate (NURTEC) 75 MG TBDP Take 1 tablet by mouth as needed (Take 1 tablet at onset of migraine ( Do not exceed more than 1 tablet in 24 hours)). 8 tablet 5   rizatriptan (MAXALT-MLT) 10 MG disintegrating tablet Take 1 tablet (10 mg total) by mouth as needed for migraine. May repeat in 2 hours if needed 9 tablet 11   Semaglutide, 2 MG/DOSE, (OZEMPIC, 2 MG/DOSE,) 8 MG/3ML SOPN INJECT 2 MG INTO THE SKIN ONCE A WEEK. 9 mL 1   sildenafil (REVATIO) 20 MG tablet Take 20 mg by mouth. 3-5 tabs prn     timolol (TIMOPTIC-XR) 0.5 % ophthalmic gel-forming Place 1 drop into both eyes daily.      verapamil (CALAN SR) 240 MG CR tablet Take 1 tablet (240 mg total) by mouth at bedtime. 90 tablet 3   baclofen (LIORESAL) 10 MG tablet Take 1 tablet (10 mg total) by mouth 3 (three) times daily. Prn for IBS flare (Patient not taking: Reported on 03/26/2022) 30 each 0   dicyclomine (BENTYL) 10 MG capsule TAKE 1 CAPSULE (10 MG TOTAL) BY MOUTH 4 (FOUR) TIMES DAILY - BEFORE MEALS AND AT BEDTIME. (Patient not taking: Reported on 03/26/2022) 90 capsule 3   methocarbamol (ROBAXIN) 500 MG tablet TAKE 1 TABLET BY MOUTH TWICE DAILY (Patient not taking: Reported on 03/26/2022) 20 tablet 0   sucralfate (CARAFATE) 1 g tablet TAKE 1 TABLET (1 G TOTAL) BY MOUTH 4  TIMES A DAY WITH MEALS AND AT BEDTIME (Patient not taking: Reported on 03/26/2022) 120 tablet 0   No current facility-administered medications on file prior to visit.        ROS:  All others reviewed and negative.  Objective        PE:  BP 118/70 (BP Location: Right Arm, Patient Position: Sitting, Cuff Size: Large)   Pulse 75   Temp 98.1 F (36.7 C) (Oral)   Ht 5' 8"$  (1.727 m)   Wt 204 lb (92.5 kg)  SpO2 96%   BMI 31.02 kg/m                 Constitutional: Pt appears in NAD               HENT: Head: NCAT.                Right Ear: External ear normal.                 Left Ear: External ear normal.                Eyes: . Pupils are equal, round, and reactive to light. Conjunctivae and EOM are normal               Nose: without d/c or deformity               Neck: Neck supple. Gross normal ROM               Cardiovascular: Normal rate and regular rhythm.                 Pulmonary/Chest: Effort normal and breath sounds without rales or wheezing.                Abd:  Soft, NT, ND, + BS, no organomegaly               Neurological: Pt is alert. At baseline orientation, motor grossly intact               Skin: Skin is warm. No rashes, no other new lesions, LE edema - none               Psychiatric: Pt behavior is normal without agitation   Micro: none  Cardiac tracings I have personally interpreted today:  none  Pertinent Radiological findings (summarize): none   Lab Results  Component Value Date   WBC 4.0 02/27/2021   HGB 13.6 02/27/2021   HCT 43.6 02/27/2021   PLT 240.0 02/27/2021   GLUCOSE 111 (H) 10/13/2021   CHOL 135 02/27/2021   TRIG 59.0 02/27/2021   HDL 59.40 02/27/2021   LDLCALC 64 02/27/2021   ALT 27 02/27/2021   AST 23 02/27/2021   NA 138 10/13/2021   K 3.8 10/13/2021   CL 100 10/13/2021   CREATININE 1.23 10/13/2021   BUN 16 10/13/2021   CO2 33 (H) 10/13/2021   TSH 0.81 02/27/2021   PSA 0.89 02/27/2021   HGBA1C 7.1 (H) 10/13/2021   MICROALBUR 0.7  02/27/2021   Assessment/Plan:  Jason Ortega is a 57 y.o. Black or African American [2] male with  has a past medical history of Allergy, Anemia, Arthritis, Cataract, Depression, Diabetes mellitus, Frequent headaches, GERD (gastroesophageal reflux disease), Glaucoma, Hypercholesteremia, Hypertension, Migraines, and Sleep apnea.  Encounter for well adult exam with abnormal findings Age and sex appropriate education and counseling updated with regular exercise and diet Referrals for preventative services - none needed Immunizations addressed - for shingles #2 today Smoking counseling  - none needed Evidence for depression or other mood disorder - none significant Most recent labs reviewed. I have personally reviewed and have noted: 1) the patient's medical and social history 2) The patient's current medications and supplements 3) The patient's height, weight, and BMI have been recorded in the chart   Hyperlipidemia LDL goal <100 Lab Results  Component Value Date   LDLCALC 64 02/27/2021   Stable, pt to continue current statin lipitor 40 mg qd  Hypertension, uncontrolled BP Readings from Last 3 Encounters:  03/26/22 118/70  03/26/22 138/84  01/24/22 (!) 132/92   Stable, pt to continue medical treatment hyzaar 100 25 mg qd, inderal 20 qhs, calan SR 240 qd   Type 2 diabetes mellitus without complication, without long-term current use of insulin (HCC) Lab Results  Component Value Date   HGBA1C 7.1 (H) 10/13/2021   Mild uncontrolled,, pt does not want change, to continue current medical treatment farxiga 10 mg qd, metformin 1000 bid, ozemipc 2 mg weekly   Vitamin D deficiency Last vitamin D Lab Results  Component Value Date   VD25OH 26.83 (L) 02/27/2021   Low, to start oral replacement  Followup: Return in about 6 months (around 09/24/2022).  Cathlean Cower, MD 03/26/2022 9:23 PM [facility]

## 2022-03-26 NOTE — Progress Notes (Signed)
    Assessment     GERD with LA Grade A esophagitis and active reflux symptoms Borderline gastroparesis   Recommendations    Increase pantoprazole to 40 mg p.o. twice daily taken 30 minutes before breakfast and dinner.  Closely follow antireflux measures and gastroparesis diet. Contact us in 4 to 6 weeks if symptoms are not under very good control.  Consider adding famotidine at bedtime or changing to another PPI. Screening colonoscopy due in September 2029 REV in 1 year   HPI    This is a 57 year old male with GERD and borderline gastroparesis with active reflux symptoms.  He relates frequent epigastric burning and substernal chest burning.  Previously he had been treated with PPI twice daily. Denies weight loss, constipation, diarrhea, change in stool caliber, melena, hematochezia, nausea, vomiting, dysphagia, chest pain.   Labs / Imaging       Latest Ref Rng & Units 02/27/2021    9:05 AM 03/03/2020    9:42 AM 09/01/2019    9:36 AM  Hepatic Function  Total Protein 6.0 - 8.3 g/dL 7.7  7.7  7.6   Albumin 3.5 - 5.2 g/dL 4.4  4.3    AST 0 - 37 U/L 23  20  23   $ ALT 0 - 53 U/L 27  21  40   Alk Phosphatase 39 - 117 U/L 52  51    Total Bilirubin 0.2 - 1.2 mg/dL 0.4  0.4  0.4   Bilirubin, Direct 0.0 - 0.3 mg/dL 0.1  0.1         Latest Ref Rng & Units 02/27/2021    9:05 AM 03/03/2020    9:42 AM 09/22/2019    4:08 PM  CBC  WBC 4.0 - 10.5 K/uL 4.0  4.6  6.2   Hemoglobin 13.0 - 17.0 g/dL 13.6  13.7  13.3   Hematocrit 39.0 - 52.0 % 43.6  42.0  42.7   Platelets 150.0 - 400.0 K/uL 240.0  265.0  269     Current Medications, Allergies, Past Medical History, Past Surgical History, Family History and Social History were reviewed in [employer] record.   Physical Exam: General: Well developed, well nourished, no acute distress Head: Normocephalic and atraumatic Eyes: Sclerae anicteric, EOMI Ears: Normal auditory acuity Mouth: No deformities or lesions  noted Lungs: Clear throughout to auscultation Heart: Regular rate and rhythm; No murmurs, rubs or bruits Abdomen: Soft, non tender and non distended. No masses, hepatosplenomegaly or hernias noted. Normal Bowel sounds Rectal: Not done Musculoskeletal: Symmetrical with no gross deformities  Pulses:  Normal pulses noted Extremities: No edema or deformities noted Neurological: Alert oriented x 4, grossly nonfocal Psychological:  Alert and cooperative. Normal mood and affect   Jason Ortega T. Fuller Plan, MD 03/26/2022, 10:21 AM

## 2022-03-26 NOTE — Patient Instructions (Addendum)
We have sent the following medications to your pharmacy for you to pick up at your convenience: Pantoprazole 40 mg twice daily.   Patient advised to avoid spicy, acidic, citrus, chocolate, mints, fruit and fruit juices.  Limit the intake of caffeine, alcohol and Soda.  Don't exercise too soon after eating.  Don't lie down within 3-4 hours of eating.  Elevate the head of your bed.  Call our office or MyChart message our office if your symptoms are not better in 4-6 weeks.   Gastroparesis  Gastroparesis is a condition in which food takes longer than normal to empty from the stomach. This condition is also known as delayed gastric emptying. It is usually a long-term (chronic) condition. There is no cure, but there are treatments and things that you can do at home to help relieve symptoms. Treating the underlying condition that causes gastroparesis can also help relieve symptoms. What are the causes? In many cases, the cause of this condition is not known. Possible causes include: A hormone (endocrine) disorder, such as hypothyroidism or diabetes. A nervous system disease, such as Parkinson's disease or multiple sclerosis. Cancer, infection, or surgery that affects the stomach or vagus nerve. The vagus nerve runs from your chest, through your neck, and to the lower part of your brain. A connective tissue disorder, such as scleroderma. Certain medicines. What increases the risk? You are more likely to develop this condition if: You have certain disorders or diseases. These may include: An endocrine disorder. An eating disorder. Amyloidosis. Scleroderma. Parkinson's disease. Multiple sclerosis. Cancer or infection of the stomach or the vagus nerve. You have had surgery on your stomach or vagus nerve. You take certain medicines. You are male. What are the signs or symptoms? Symptoms of this condition include: Feeling full after eating very little or a loss of appetite. Nausea, vomiting,  or heartburn. Bloating of your abdomen. Inconsistent blood sugar (glucose) levels on blood tests. Unexplained weight loss. Acid from the stomach coming up into the esophagus (gastroesophageal reflux). Sudden tightening (spasm) of the stomach, which can be painful. Symptoms may come and go. Some people may not notice any symptoms. How is this diagnosed? This condition is diagnosed with tests, such as: Tests that check how long it takes food to move through the stomach and intestines. These tests include: Upper gastrointestinal (GI) series. For this test, you drink a liquid that shows up well on X-rays, and then X-rays are taken of your intestines. Gastric emptying scintigraphy. For this test, you eat food that contains a small amount of radioactive material, and then scans are taken. Wireless capsule GI monitoring system. For this test, you swallow a pill (capsule) that records information about how foods and fluid move through your stomach. Gastric manometry. For this test, a tube is passed down your throat and into your stomach to measure electrical and muscular activity. Endoscopy. For this test, a long, thin tube with a camera and light on the end is passed down your throat and into your stomach to check for problems in your stomach lining. Ultrasound. This test uses sound waves to create images of the inside of your body. This can help rule out gallbladder disease or pancreatitis as a cause of your symptoms. How is this treated? There is no cure for this condition, but treatment and home care may relieve symptoms. Treatment may include: Treating the underlying cause. Managing your symptoms by making changes to your diet and exercise habits. Taking medicines to control nausea and vomiting and  to stimulate stomach muscles. Getting food through a feeding tube in the hospital. This may be done in severe cases. Having surgery to insert a device called a gastric electrical stimulator into your  body. This device helps improve stomach emptying and control nausea and vomiting. Follow these instructions at home: Take over-the-counter and prescription medicines only as told by your health care provider. Follow instructions from your health care provider about eating or drinking restrictions. Your health care provider may recommend that you: Eat smaller meals more often. Eat low-fat foods. Eat low-fiber forms of high-fiber foods. For example, eat cooked vegetables instead of raw vegetables. Have only liquid foods instead of solid foods. Liquid foods are easier to digest. Drink enough fluid to keep your urine pale yellow. Exercise as often as told by your health care provider. Keep all follow-up visits. This is important. Contact a health care provider if you: Notice that your symptoms do not improve with treatment. Have new symptoms. Get help right away if you: Have severe pain in your abdomen that does not improve with treatment. Have nausea that is severe or does not go away. Vomit every time you drink fluids. Summary Gastroparesis is a long-term (chronic) condition in which food takes longer than normal to empty from the stomach. Symptoms include nausea, vomiting, heartburn, bloating of your abdomen, and loss of appetite. Eating smaller portions, low-fat foods, and low-fiber forms of high-fiber foods may help you manage your symptoms. Get help right away if you have severe pain in your abdomen. This information is not intended to replace advice given to you by your health care provider. Make sure you discuss any questions you have with your health care provider. Document Revised: 06/01/2019 Document Reviewed: 06/01/2019 Elsevier Patient Education  [place].

## 2022-03-26 NOTE — Assessment & Plan Note (Signed)
Last vitamin D Lab Results  Component Value Date   VD25OH 26.83 (L) 02/27/2021   Low, to start oral replacement

## 2022-03-27 LAB — MICROALBUMIN / CREATININE URINE RATIO
Creatinine,U: 118.9 mg/dL
Microalb Creat Ratio: 1.2 mg/g (ref 0.0–30.0)
Microalb, Ur: 1.4 mg/dL (ref 0.0–1.9)

## 2022-04-22 ENCOUNTER — Other Ambulatory Visit: Payer: Self-pay | Admitting: Internal Medicine

## 2022-04-24 ENCOUNTER — Other Ambulatory Visit: Payer: Self-pay | Admitting: Internal Medicine

## 2022-04-27 ENCOUNTER — Other Ambulatory Visit: Payer: Self-pay | Admitting: Endocrinology

## 2022-04-27 DIAGNOSIS — E119 Type 2 diabetes mellitus without complications: Secondary | ICD-10-CM

## 2022-05-29 NOTE — Progress Notes (Unsigned)
Patient ID: Jason Ortega, male   DOB: 07/14/1965, 57 y.o.   MRN: [id number]           Reason for Appointment: Type II Diabetes follow-up   History of Present Illness   Diagnosis date: 1999  Previous history:  Non-insulin hypoglycemic drugs previously used: Metformin, Prandin, Amaryl, Byetta Insulin was used in 2020 for a few months Ozempic was started in 2020  A1c range in the last few years is: 5.5-7.9  Recent history:     Non-insulin hypoglycemic drugs: Metformin ER 2 g daily, Ozempic 2 mg weekly, Farxiga 10 mg daily    Side effects from medications: None  Current self management, blood sugar patterns and problems identified:  A1c is back down to 6.6 He has been able to get his Ozempic fairly regularly although earlier in the year he had difficulty with the refill Has been on 2 mg Ozempic since 5/22 He has not checked his blood sugars for about a month as he lost his meter Otherwise he would generally check the blood sugars only in the mornings despite reminders to check readings after meals also He says he is trying to eat healthy meals although has gained 4 pounds recently He says he is mostly walking at work doing long hours sometimes and does not do any formal exercise He has not been to a nutritionist in the past No nausea with Ozempic  Exercise: only walking recently     Hypoglycemia:  none    Glucometer: One Touch Verio.           Blood Glucose readings from recall:  PRE-MEAL Fasting Lunch Dinner Bedtime Overall  Glucose range: <120      Mean/median:        POST-MEAL PC Breakfast PC Lunch PC Dinner  Glucose range:   ?  Mean/median:       Dietician visit: Most recent:   None  Weight control:  Wt Readings from Last 3 Encounters:  05/30/22 208 lb (94.3 kg)  03/26/22 204 lb (92.5 kg)  03/26/22 204 lb (92.5 kg)            Diabetes labs:  Lab Results  Component Value Date   HGBA1C 6.6 (H) 03/26/2022   HGBA1C 7.1 (H) 10/13/2021   HGBA1C 6.6 (A)  04/26/2021   Lab Results  Component Value Date   MICROALBUR 1.4 03/26/2022   LDLCALC 66 03/26/2022   CREATININE 1.02 03/26/2022    No results found for: "FRUCTOSAMINE"   Allergies as of 05/30/2022       Reactions   Pollen Extract Other (See Comments)   Stuffy nose        Medication List        Accurate as of May 30, 2022  8:09 AM. If you have any questions, ask your nurse or doctor.          aspirin EC 325 MG tablet Take 1 tablet (325 mg total) by mouth daily.   atorvastatin 40 MG tablet Commonly known as: LIPITOR TAKE 1 TABLET BY MOUTH EVERY DAY   baclofen 10 MG tablet Commonly known as: LIORESAL Take 1 tablet (10 mg total) by mouth 3 (three) times daily. Prn for IBS flare   Blood Pressure Digital Soln Kit   chlorproMAZINE 25 MG tablet Commonly known as: THORAZINE Take 1 tablet (25 mg total) by mouth 3 (three) times daily as needed.   diclofenac sodium 1 % Gel Commonly known as: VOLTAREN Apply 2 g topically 4 (four) times daily.  Pennsaid 2 % Soln Generic drug: diclofenac Sodium SMARTSIG:2 Pump Topical Twice Daily   dicyclomine 10 MG capsule Commonly known as: BENTYL TAKE 1 CAPSULE (10 MG TOTAL) BY MOUTH 4 (FOUR) TIMES DAILY - BEFORE MEALS AND AT BEDTIME.   Emgality 120 MG/ML Soaj Generic drug: Galcanezumab-gnlm Inject 120 mg into the skin every 30 (thirty) days.   Farxiga 10 MG Tabs tablet Generic drug: dapagliflozin propanediol TAKE 1 TABLET BY MOUTH EVERY DAY   gabapentin 300 MG capsule Commonly known as: NEURONTIN Take 1 capsule (300 mg total) by mouth at bedtime.   ibuprofen 600 MG tablet Commonly known as: ADVIL TAKE 1 TABLET(600 MG) BY MOUTH EVERY 8 HOURS AS NEEDED   losartan-hydrochlorothiazide 100-25 MG tablet Commonly known as: HYZAAR TAKE 1 TABLET BY MOUTH EVERY DAY   meloxicam 15 MG tablet Commonly known as: MOBIC Take 1 tablet (15 mg total) by mouth daily.   metFORMIN 1000 MG tablet Commonly known as:  GLUCOPHAGE Take 1 tablet (1,000 mg total) by mouth 2 (two) times daily with a meal.   methocarbamol 500 MG tablet Commonly known as: ROBAXIN TAKE 1 TABLET BY MOUTH TWICE DAILY   Nurtec 75 MG Tbdp Generic drug: Rimegepant Sulfate Take 1 tablet by mouth as needed (Take 1 tablet at onset of migraine ( Do not exceed more than 1 tablet in 24 hours)).   OneTouch Delica Lancets 30G Misc 1 each by Does not apply route 2 (two) times daily. E11.9   OneTouch Verio test strip Generic drug: glucose blood 1 each by Other route 2 (two) times daily. E11.9   OneTouch Verio w/Device Kit 1 Device by Does not apply route 2 (two) times daily as needed. What changed:  when to take this additional instructions   Ozempic (2 MG/DOSE) 8 MG/3ML Sopn Generic drug: Semaglutide (2 MG/DOSE) INJECT 2 MG INTO THE SKIN ONCE A WEEK.   pantoprazole 40 MG tablet Commonly known as: PROTONIX Take 1 tablet (40 mg total) by mouth daily.   pantoprazole 40 MG tablet Commonly known as: PROTONIX Take 1 tablet (40 mg total) by mouth 2 (two) times daily.   propranolol 20 MG tablet Commonly known as: INDERAL Take 1 tablet (20 mg total) by mouth at bedtime.   rizatriptan 10 MG disintegrating tablet Commonly known as: MAXALT-MLT Take 1 tablet (10 mg total) by mouth as needed for migraine. May repeat in 2 hours if needed   sildenafil 20 MG tablet Commonly known as: REVATIO Take 20 mg by mouth. 3-5 tabs prn   sucralfate 1 g tablet Commonly known as: CARAFATE TAKE 1 TABLET (1 G TOTAL) BY MOUTH 4 TIMES A DAY WITH MEALS AND AT BEDTIME   timolol 0.5 % ophthalmic gel-forming Commonly known as: TIMOPTIC-XR Place 1 drop into both eyes daily.   verapamil 240 MG CR tablet Commonly known as: Calan SR Take 1 tablet (240 mg total) by mouth at bedtime.        Allergies:  Allergies  Allergen Reactions   Pollen Extract Other (See Comments)    Stuffy nose    Past Medical History:  Diagnosis Date   Allergy     Anemia    Arthritis    Cataract    bilateral cataracts removed   Depression    Diabetes mellitus    Frequent headaches    GERD (gastroesophageal reflux disease)    Glaucoma    Hypercholesteremia    Hypertension    Migraines    Sleep apnea    wears c-PAP  Past Surgical History:  Procedure Laterality Date   bilateral cataracts removed     CARDIAC CATHETERIZATION  12/2011   COLONOSCOPY     LEFT HEART CATHETERIZATION WITH CORONARY ANGIOGRAM N/A 11/12/2011   Procedure: LEFT HEART CATHETERIZATION WITH CORONARY ANGIOGRAM;  Surgeon: Pamella Pert, MD;  Location: [facility] CATH LAB;  Service: Cardiovascular;  Laterality: N/A;   SHOULDER SURGERY      Family History  Problem Relation Age of Onset   Arthritis Mother    Stroke Mother    Hypertension Father    Diabetes Father    Cancer Father        Multiple Myleoma   Diabetes Maternal Grandfather    Colon cancer Neg Hx    Esophageal cancer Neg Hx    Rectal cancer Neg Hx    Stomach cancer Neg Hx     Social History:  reports that he has never smoked. He has never used smokeless tobacco. He reports current alcohol use. He reports that he does not use drugs.  Review of Systems:  Last diabetic eye exam date 1/24  Last urine microalbumin date: 02/2021  Last foot exam date: 9/23  Symptoms of neuropathy: none  Hypertension:   ACE/ARB medication: Losartan, also on verapamil and HCTZ Followed by PCP  BP Readings from Last 3 Encounters:  05/30/22 132/76  03/26/22 118/70  03/26/22 138/84    Lipid management: LDL controlled with Lipitor 40 mg daily from his PCP    Lab Results  Component Value Date   CHOL 152 03/26/2022   CHOL 135 02/27/2021   CHOL 124 03/03/2020   Lab Results  Component Value Date   HDL 63.00 03/26/2022   HDL 59.40 02/27/2021   HDL 50.00 03/03/2020   Lab Results  Component Value Date   LDLCALC 66 03/26/2022   LDLCALC 64 02/27/2021   LDLCALC 60 03/03/2020   Lab Results  Component Value Date    TRIG 113.0 03/26/2022   TRIG 59.0 02/27/2021   TRIG 67.0 03/03/2020   Lab Results  Component Value Date   CHOLHDL 2 03/26/2022   CHOLHDL 2 02/27/2021   CHOLHDL 2 03/03/2020   No results found for: "LDLDIRECT"  Unable to calculate Fibrosis 4 Score. Requires ALT, AST, and platelet count within the last 6 months.      Examination:   BP 132/76 (BP Location: Left Arm, Patient Position: Sitting, Cuff Size: Small)   Pulse 71   Ht 5\' 8"  (1.727 m)   Wt 208 lb (94.3 kg)   SpO2 99%   BMI 31.63 kg/m   Body mass index is 31.63 kg/m.    ASSESSMENT/ PLAN:    Diabetes type 2 non-insulin-dependent with mild obesity:   Current regimen: Metformin, Ozempic 2 mg weekly and Farxiga  See history of present illness for detailed discussion of current diabetes management, blood sugar patterns and problems identified  A1c is back to 6.6  His last visit was in 9/23 Likely A1c is slightly improved since he may be generally doing better on his diet Lab glucose random in March 08, 1989 Not always controlled portion Discussed that he needs to check his blood sugars after meals to help adjust his diet further   Recommendations:  No change in medication regimen New meter prescribed and he will start checking his blood sugars regularly Discussed that he needs to likely check more readings after meals when he can He should at least exercise on the days off Also he was able to add some exercise  at the gym would be desirable   HYPERTENSION: Blood pressure is appearing well-controlled, to follow-up with PCP again  There are no Patient Instructions on file for this visit.    Reather Littler 05/30/2022, 8:09 AM

## 2022-05-30 ENCOUNTER — Encounter: Payer: Self-pay | Admitting: Hematology and Oncology

## 2022-05-30 ENCOUNTER — Ambulatory Visit (INDEPENDENT_AMBULATORY_CARE_PROVIDER_SITE_OTHER): Payer: 59 | Admitting: Endocrinology

## 2022-05-30 ENCOUNTER — Encounter: Payer: Self-pay | Admitting: Endocrinology

## 2022-05-30 VITALS — BP 132/76 | HR 71 | Ht 68.0 in | Wt 208.0 lb

## 2022-05-30 DIAGNOSIS — E119 Type 2 diabetes mellitus without complications: Secondary | ICD-10-CM | POA: Diagnosis not present

## 2022-05-30 DIAGNOSIS — E669 Obesity, unspecified: Secondary | ICD-10-CM

## 2022-05-30 DIAGNOSIS — Z7984 Long term (current) use of oral hypoglycemic drugs: Secondary | ICD-10-CM

## 2022-05-30 DIAGNOSIS — Z7985 Long-term (current) use of injectable non-insulin antidiabetic drugs: Secondary | ICD-10-CM

## 2022-05-30 MED ORDER — ONETOUCH VERIO W/DEVICE KIT: 1.0000 | PACK | Freq: Two times a day (BID) | 0 refills | Status: DC

## 2022-05-30 NOTE — Patient Instructions (Signed)
Check blood sugars on waking up 2 days a week  Also check blood sugars about 2 hours after meals and do this after different meals by rotation  Recommended blood sugar levels on waking up are 90-130 and about 2 hours after meal is 130-160  Please bring your blood sugar monitor to each visit, thank you   

## 2022-06-15 ENCOUNTER — Other Ambulatory Visit: Payer: Self-pay | Admitting: Internal Medicine

## 2022-06-19 ENCOUNTER — Ambulatory Visit: Payer: 59 | Admitting: Endocrinology

## 2022-06-22 ENCOUNTER — Other Ambulatory Visit: Payer: Self-pay | Admitting: Neurology

## 2022-07-08 ENCOUNTER — Other Ambulatory Visit: Payer: Self-pay | Admitting: Internal Medicine

## 2022-07-09 ENCOUNTER — Other Ambulatory Visit: Payer: Self-pay

## 2022-07-20 ENCOUNTER — Other Ambulatory Visit: Payer: Self-pay | Admitting: Endocrinology

## 2022-07-31 ENCOUNTER — Telehealth: Payer: Self-pay | Admitting: Family Medicine

## 2022-07-31 NOTE — Telephone Encounter (Signed)
Phone room: please call back. Looks like per AL,NP last OV note, she advised him to f/u yearly. 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

## 2022-07-31 NOTE — Telephone Encounter (Signed)
Pt said he has FMLA on his job for migraines, Pt asking if he needs to be seen twice a year because of the FMLA.

## 2022-08-13 ENCOUNTER — Ambulatory Visit: Payer: 59 | Admitting: Family Medicine

## 2022-08-13 ENCOUNTER — Encounter: Payer: Self-pay | Admitting: Family Medicine

## 2022-08-13 VITALS — BP 128/77 | HR 82 | Ht 67.0 in | Wt 203.2 lb

## 2022-08-13 DIAGNOSIS — G4733 Obstructive sleep apnea (adult) (pediatric): Secondary | ICD-10-CM | POA: Diagnosis not present

## 2022-08-13 DIAGNOSIS — G43709 Chronic migraine without aura, not intractable, without status migrainosus: Secondary | ICD-10-CM

## 2022-08-13 MED ORDER — RIZATRIPTAN BENZOATE 10 MG PO TBDP: ORAL_TABLET | ORAL | 11 refills | Status: DC

## 2022-08-13 MED ORDER — NURTEC 75 MG PO TBDP: 1.0000 | ORAL_TABLET | ORAL | 11 refills | Status: DC | PRN

## 2022-08-13 MED ORDER — EMGALITY 120 MG/ML ~~LOC~~ SOAJ: 120.0000 mg | SUBCUTANEOUS | 3 refills | Status: DC

## 2022-08-13 NOTE — Patient Instructions (Signed)
Below is our plan:  We will continue Emgality, propranolol, Nurtec and rizatriptan as prescribed. I will place another referral to sleep med for evaluation of sleep apnea. Please let me know if you do not hear back from our team to schedule an appointment.   Please make sure you are staying well hydrated. I recommend 50-60 ounces daily. Well balanced diet and regular exercise encouraged. Consistent sleep schedule with 6-8 hours recommended.   Please continue follow up with care team as directed.   Follow up with me pending sleep eval.   You may receive a survey regarding today's visit. I encourage you to leave honest feed back as I do use this information to improve patient care. Thank you for seeing me today!   GENERAL HEADACHE INFORMATION:   Natural supplements: Magnesium Oxide or Magnesium Glycinate 500 mg at bed (up to 800 mg daily) Coenzyme Q10 300 mg in AM Vitamin B2- 200 mg twice a day   Add 1 supplement at a time since even natural supplements can have undesirable side effects. You can sometimes buy supplements cheaper (especially Coenzyme Q10) at www.WebmailGuide.co.za or at [facility]  Migraine with aura: There is increased risk for stroke in women with migraine with aura and a contraindication for the combined contraceptive pill for use by women who have migraine with aura. The risk for women with migraine without aura is lower. However other risk factors like smoking are far more likely to increase stroke risk than migraine. There is a recommendation for no smoking and for the use of OCPs without estrogen such as progestogen only pills particularly for women with migraine with aura.[name] People who have migraine headaches with auras may be 3 times more likely to have a stroke caused by a blood clot, compared to migraine patients who don't see auras. Women who take hormone-replacement therapy may be 30 percent more likely to suffer a clot-based stroke than women not taking medication containing  estrogen. Other risk factors like smoking and high blood pressure may be  much more important.    Vitamins and herbs that show potential:   Magnesium: Magnesium (250 mg twice a day or 500 mg at bed) has a relaxant effect on smooth muscles such as blood vessels. Individuals suffering from frequent or daily headache usually have low magnesium levels which can be increase with daily supplementation of 400-750 mg. Three trials found 40-90% average headache reduction  when used as a preventative. Magnesium may help with headaches are aura, the best evidence for magnesium is for migraine with aura is its thought to stop the cortical spreading depression we believe is the pathophysiology of migraine aura.Magnesium also demonstrated the benefit in menstrually related migraine.  Magnesium is part of the messenger system in the serotonin cascade and it is a good muscle relaxant.  It is also useful for constipation which can be a side effect of other medications used to treat migraine. Good sources include nuts, whole grains, and tomatoes. Side Effects: loose stool/diarrhea  Riboflavin (vitamin B 2) 200 mg twice a day. This vitamin assists nerve cells in the production of ATP a principal energy storing molecule.  It is necessary for many chemical reactions in the body.  There have been at least 3 clinical trials of riboflavin using 400 mg per day all of which suggested that migraine frequency can be decreased.  All 3 trials showed significant improvement in over half of migraine sufferers.  The supplement is found in bread, cereal, milk, meat, and poultry.  Most Americans get more riboflavin than the recommended daily allowance, however riboflavin deficiency is not necessary for the supplements to help prevent headache. Side effects: energizing, green urine   Coenzyme Q10: This is present in almost all cells in the body and is critical component for the conversion of energy.  Recent studies have shown that a  nutritional supplement of CoQ10 can reduce the frequency of migraine attacks by improving the energy production of cells as with riboflavin.  Doses of 150 mg twice a day have been shown to be effective.   Melatonin: Increasing evidence shows correlation between melatonin secretion and headache conditions.  Melatonin supplementation has decreased headache intensity and duration.  It is widely used as a sleep aid.  Sleep is natures way of dealing with migraine.  A dose of 3 mg is recommended to start for headaches including cluster headache. Higher doses up to 15 mg has been reviewed for use in Cluster headache and have been used. The rationale behind using melatonin for cluster is that many theories regarding the cause of Cluster headache center around the disruption of the normal circadian rhythm in the brain.  This helps restore the normal circadian rhythm.   HEADACHE DIET: Foods and beverages which may trigger migraine Note that only 20% of headache patients are food sensitive. You will know if you are food sensitive if you get a headache consistently 20 minutes to 2 hours after eating a certain food. Only cut out a food if it causes headaches, otherwise you might remove foods you enjoy! What matters most for diet is to eat a well balanced healthy diet full of vegetables and low fat protein, and to not miss meals.   Chocolate, other sweets ALL cheeses except cottage and cream cheese Dairy products, yogurt, sour cream, ice cream Liver Meat extracts (Bovril, Marmite, meat tenderizers) Meats or fish which have undergone aging, fermenting, pickling or smoking. These include: Hotdogs,salami,Lox,sausage, mortadellas,smoked salmon, pepperoni, Pickled herring Pods of broad bean (English beans, Chinese pea pods, [place] (fava) beans, lima and navy beans Ripe avocado, ripe banana Yeast extracts or active yeast preparations such as Brewer's or Fleishman's (commercial bakes goods are permitted) Tomato based  foods, pizza (lasagna, etc.)   MSG (monosodium glutamate) is disguised as many things; look for these common aliases: Monopotassium glutamate Autolysed yeast Hydrolysed protein Sodium caseinate "flavorings" "all natural preservatives" Nutrasweet   Avoid all other foods that convincingly provoke headaches.   Resources: The Dizzy [place]air Laundry Your Headache Diet, migrainestrong.com  https://zamora-andrews.com/   Caffeine and Migraine For patients that have migraine, caffeine intake more than 3 days per week can lead to dependency and increased migraine frequency. I would recommend cutting back on your caffeine intake as best you can. The recommended amount of caffeine is 200-300 mg daily, although migraine patients may experience dependency at even lower doses. While you may notice an increase in headache temporarily, cutting back will be helpful for headaches in the long run. For more information on caffeine and migraine, visit: https://americanmigrainefoundation.org/resource-library/caffeine-and-migraine/   Headache Prevention Strategies:   1. Maintain a headache diary; learn to identify and avoid triggers.  - This can be a simple note where you log when you had a headache, associated symptoms, and medications used - There are several smartphone apps developed to help track migraines: Migraine Buddy, Migraine Monitor, Curelator N1-Headache App   Common triggers include: Emotional triggers: Emotional/Upset family or friends Emotional/Upset occupation Business reversal/success Anticipation anxiety Crisis-serious Post-crisis periodNew job/position   Physical triggers: Vacation Day  Weekend Strenuous Exercise High Altitude Location New Move Menstrual Day Physical Illness Oversleep/Not enough sleep Weather changes Light: Photophobia or light sesnitivity treatment involves a balance between desensitization and reduction in overly strong  input. Use dark polarized glasses outside, but not inside. Avoid bright or fluorescent light, but do not dim environment to the point that going into a normally lit room hurts. Consider FL-41 tint lenses, which reduce the most irritating wavelengths without blocking too much light.  These can be obtained at axonoptics.com or theraspecs.com Foods: see list above.   2. Limit use of acute treatments (over-the-counter medications, triptans, etc.) to no more than 2 days per week or 10 days per month to prevent medication overuse headache (rebound headache).     3. Follow a regular schedule (including weekends and holidays): Don't skip meals. Eat a balanced diet. 8 hours of sleep nightly. Minimize stress. Exercise 30 minutes per day. Being overweight is associated with a 5 times increased risk of chronic migraine. Keep well hydrated and drink 6-8 glasses of water per day.   4. Initiate non-pharmacologic measures at the earliest onset of your headache. Rest and quiet environment. Relax and reduce stress. Breathe2Relax is a free app that can instruct you on    some simple relaxtion and breathing techniques. Http://Dawnbuse.com is a    free website that provides teaching videos on relaxation.  Also, there are  many apps that   can be downloaded for "mindful" relaxation.  An app called YOGA NIDRA will help walk you through mindfulness. Another app called Calm can be downloaded to give you a structured mindfulness guide with daily reminders and skill development. Headspace for guided meditation Mindfulness Based Stress Reduction Online Course: www.palousemindfulness.com Cold compresses.   5. Don't wait!! Take the maximum allowable dosage of prescribed medication at the first sign of migraine.   6. Compliance:  Take prescribed medication regularly as directed and at the first sign of a migraine.   7. Communicate:  Call your physician when problems arise, especially if your headaches change, increase in  frequency/severity, or become associated with neurological symptoms (weakness, numbness, slurred speech, etc.). Proceed to emergency room if you experience new or worsening symptoms or symptoms do not resolve, if you have new neurologic symptoms or if headache is severe, or for any concerning symptom.   8. Headache/pain management therapies: Consider various complementary methods, including medication, behavioral therapy, psychological counselling, biofeedback, massage therapy, acupuncture, dry needling, and other modalities.  Such measures may reduce the need for medications. Counseling for pain management, where patients learn to function and ignore/minimize their pain, seems to work very well.   9. Recommend changing family's attention and focus away from patient's headaches. Instead, emphasize daily activities. If first question of day is 'How are your headaches/Do you have a headache today?', then patient will constantly think about headaches, thus making them worse. Goal is to re-direct attention away from headaches, toward daily activities and other distractions.   10. Helpful Websites: www.AmericanHeadacheSociety.org PatentHood.ch www.headaches.org TightMarket.nl www.achenet.org
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
## 2022-08-30 ENCOUNTER — Ambulatory Visit (INDEPENDENT_AMBULATORY_CARE_PROVIDER_SITE_OTHER): Payer: 59 | Admitting: Family Medicine

## 2022-08-30 ENCOUNTER — Encounter: Payer: Self-pay | Admitting: Family Medicine

## 2022-08-30 ENCOUNTER — Telehealth: Payer: Self-pay | Admitting: Family Medicine

## 2022-08-30 VITALS — BP 142/90 | HR 68 | Ht 68.0 in | Wt 204.0 lb

## 2022-08-30 DIAGNOSIS — G43709 Chronic migraine without aura, not intractable, without status migrainosus: Secondary | ICD-10-CM | POA: Diagnosis not present

## 2022-08-30 DIAGNOSIS — G4733 Obstructive sleep apnea (adult) (pediatric): Secondary | ICD-10-CM | POA: Diagnosis not present

## 2022-08-30 MED ORDER — METHYLPREDNISOLONE 4 MG PO TBPK: ORAL_TABLET | ORAL | 0 refills | Status: DC

## 2022-08-30 NOTE — Telephone Encounter (Signed)
Pt requesting a letter for work to cover days July 23,24,25 for migraines. Would like a call back when letter is ready for pick up.

## 2022-08-30 NOTE — Telephone Encounter (Signed)
Called pt informed him of message [facility] sent. Pt was scheduled to see Amy today @ 2:30 pm.

## 2022-08-30 NOTE — Telephone Encounter (Signed)
Called and spoke with patient regarding the below. Pt said he is taking all his medications as directed  Emgality, propranolol 20mg  every day at bedtime, Nurtec and rizatriptan. Pt said he has not missed any of his medications, he actually went to work today and had to leave due to smelling a co-worker strong perfume, he can't take another Nurtec until later. Pt said this week has been rough due to the weather. He was told that he would need a letter to return to work. Are you okay with providing this letter?  Please advise

## 2022-08-30 NOTE — Patient Instructions (Signed)
Below is our plan:  We will try a low dose steroid taper pack. Continue Nurtec and rizatriptan daily for the next 2-3 days. Please keep a close eye on your blood sugars. If they jump over 300 consistently over the next 2-3 days please let me known.   Please make sure you are staying well hydrated. I recommend 50-60 ounces daily. Well balanced diet and regular exercise encouraged. Consistent sleep schedule with 6-8 hours recommended.   Please continue follow up with care team as directed.   Follow up with me pending your sleep eval with Dr Frances Furbish 09/27/2022.   You may receive a survey regarding today's visit. I encourage you to leave honest feed back as I do use this information to improve patient care. Thank you for seeing me today!   GENERAL HEADACHE INFORMATION:   Natural supplements: Magnesium Oxide or Magnesium Glycinate 500 mg at bed (up to 800 mg daily) Coenzyme Q10 300 mg in AM Vitamin B2- 200 mg twice a day   Add 1 supplement at a time since even natural supplements can have undesirable side effects. You can sometimes buy supplements cheaper (especially Coenzyme Q10) at www.WebmailGuide.co.za or at [facility].  Migraine with aura: There is increased risk for stroke in women with migraine with aura and a contraindication for the combined contraceptive pill for use by women who have migraine with aura. The risk for women with migraine without aura is lower. However other risk factors like smoking are far more likely to increase stroke risk than migraine. There is a recommendation for no smoking and for the use of OCPs without estrogen such as progestogen only pills particularly for women with migraine with aura.[name] People who have migraine headaches with auras may be 3 times more likely to have a stroke caused by a blood clot, compared to migraine patients who don't see auras. Women who take hormone-replacement therapy may be 30 percent more likely to suffer a clot-based stroke than women not taking  medication containing estrogen. Other risk factors like smoking and high blood pressure may be  much more important.    Vitamins and herbs that show potential:   Magnesium: Magnesium (250 mg twice a day or 500 mg at bed) has a relaxant effect on smooth muscles such as blood vessels. Individuals suffering from frequent or daily headache usually have low magnesium levels which can be increase with daily supplementation of 400-750 mg. Three trials found 40-90% average headache reduction  when used as a preventative. Magnesium may help with headaches are aura, the best evidence for magnesium is for migraine with aura is its thought to stop the cortical spreading depression we believe is the pathophysiology of migraine aura.Magnesium also demonstrated the benefit in menstrually related migraine.  Magnesium is part of the messenger system in the serotonin cascade and it is a good muscle relaxant.  It is also useful for constipation which can be a side effect of other medications used to treat migraine. Good sources include nuts, whole grains, and tomatoes. Side Effects: loose stool/diarrhea  Riboflavin (vitamin B 2) 200 mg twice a day. This vitamin assists nerve cells in the production of ATP a principal energy storing molecule.  It is necessary for many chemical reactions in the body.  There have been at least 3 clinical trials of riboflavin using 400 mg per day all of which suggested that migraine frequency can be decreased.  All 3 trials showed significant improvement in over half of migraine sufferers.  The supplement is found  in bread, cereal, milk, meat, and poultry.  Most Americans get more riboflavin than the recommended daily allowance, however riboflavin deficiency is not necessary for the supplements to help prevent headache. Side effects: energizing, green urine   Coenzyme Q10: This is present in almost all cells in the body and is critical component for the conversion of energy.  Recent studies have  shown that a nutritional supplement of CoQ10 can reduce the frequency of migraine attacks by improving the energy production of cells as with riboflavin.  Doses of 150 mg twice a day have been shown to be effective.   Melatonin: Increasing evidence shows correlation between melatonin secretion and headache conditions.  Melatonin supplementation has decreased headache intensity and duration.  It is widely used as a sleep aid.  Sleep is natures way of dealing with migraine.  A dose of 3 mg is recommended to start for headaches including cluster headache. Higher doses up to 15 mg has been reviewed for use in Cluster headache and have been used. The rationale behind using melatonin for cluster is that many theories regarding the cause of Cluster headache center around the disruption of the normal circadian rhythm in the brain.  This helps restore the normal circadian rhythm.   HEADACHE DIET: Foods and beverages which may trigger migraine Note that only 20% of headache patients are food sensitive. You will know if you are food sensitive if you get a headache consistently 20 minutes to 2 hours after eating a certain food. Only cut out a food if it causes headaches, otherwise you might remove foods you enjoy! What matters most for diet is to eat a well balanced healthy diet full of vegetables and low fat protein, and to not miss meals.   Chocolate, other sweets ALL cheeses except cottage and cream cheese Dairy products, yogurt, sour cream, ice cream Liver Meat extracts (Bovril, Marmite, meat tenderizers) Meats or fish which have undergone aging, fermenting, pickling or smoking. These include: Hotdogs,salami,Lox,sausage, mortadellas,smoked salmon, pepperoni, Pickled herring Pods of broad bean (English beans, Chinese pea pods, [place] (fava) beans, lima and navy beans Ripe avocado, ripe banana Yeast extracts or active yeast preparations such as Brewer's or Fleishman's (commercial bakes goods are  permitted) Tomato based foods, pizza (lasagna, etc.)   MSG (monosodium glutamate) is disguised as many things; look for these common aliases: Monopotassium glutamate Autolysed yeast Hydrolysed protein Sodium caseinate "flavorings" "all natural preservatives" Nutrasweet   Avoid all other foods that convincingly provoke headaches.   Resources: The Dizzy [place] Laundry Your Headache Diet, migrainestrong.com  https://zamora-andrews.com/   Caffeine and Migraine For patients that have migraine, caffeine intake more than 3 days per week can lead to dependency and increased migraine frequency. I would recommend cutting back on your caffeine intake as best you can. The recommended amount of caffeine is 200-300 mg daily, although migraine patients may experience dependency at even lower doses. While you may notice an increase in headache temporarily, cutting back will be helpful for headaches in the long run. For more information on caffeine and migraine, visit: https://americanmigrainefoundation.org/resource-library/caffeine-and-migraine/   Headache Prevention Strategies:   1. Maintain a headache diary; learn to identify and avoid triggers.  - This can be a simple note where you log when you had a headache, associated symptoms, and medications used - There are several smartphone apps developed to help track migraines: Migraine Buddy, Migraine Monitor, Curelator N1-Headache App   Common triggers include: Emotional triggers: Emotional/Upset family or friends Emotional/Upset occupation Business reversal/success Anticipation anxiety Crisis-serious Post-crisis  periodNew job/position   Physical triggers: Vacation Day Weekend Strenuous Exercise High Altitude Location New Move Menstrual Day Physical Illness Oversleep/Not enough sleep Weather changes Light: Photophobia or light sesnitivity treatment involves a balance between desensitization and  reduction in overly strong input. Use dark polarized glasses outside, but not inside. Avoid bright or fluorescent light, but do not dim environment to the point that going into a normally lit room hurts. Consider FL-41 tint lenses, which reduce the most irritating wavelengths without blocking too much light.  These can be obtained at axonoptics.com or theraspecs.com Foods: see list above.   2. Limit use of acute treatments (over-the-counter medications, triptans, etc.) to no more than 2 days per week or 10 days per month to prevent medication overuse headache (rebound headache).     3. Follow a regular schedule (including weekends and holidays): Don't skip meals. Eat a balanced diet. 8 hours of sleep nightly. Minimize stress. Exercise 30 minutes per day. Being overweight is associated with a 5 times increased risk of chronic migraine. Keep well hydrated and drink 6-8 glasses of water per day.   4. Initiate non-pharmacologic measures at the earliest onset of your headache. Rest and quiet environment. Relax and reduce stress. Breathe2Relax is a free app that can instruct you on    some simple relaxtion and breathing techniques. Http://Dawnbuse.com is a    free website that provides teaching videos on relaxation.  Also, there are  many apps that   can be downloaded for "mindful" relaxation.  An app called YOGA NIDRA will help walk you through mindfulness. Another app called Calm can be downloaded to give you a structured mindfulness guide with daily reminders and skill development. Headspace for guided meditation Mindfulness Based Stress Reduction Online Course: www.palousemindfulness.com Cold compresses.   5. Don't wait!! Take the maximum allowable dosage of prescribed medication at the first sign of migraine.   6. Compliance:  Take prescribed medication regularly as directed and at the first sign of a migraine.   7. Communicate:  Call your physician when problems arise, especially if your  headaches change, increase in frequency/severity, or become associated with neurological symptoms (weakness, numbness, slurred speech, etc.). Proceed to emergency room if you experience new or worsening symptoms or symptoms do not resolve, if you have new neurologic symptoms or if headache is severe, or for any concerning symptom.   8. Headache/pain management therapies: Consider various complementary methods, including medication, behavioral therapy, psychological counselling, biofeedback, massage therapy, acupuncture, dry needling, and other modalities.  Such measures may reduce the need for medications. Counseling for pain management, where patients learn to function and ignore/minimize their pain, seems to work very well.   9. Recommend changing family's attention and focus away from patient's headaches. Instead, emphasize daily activities. If first question of day is 'How are your headaches/Do you have a headache today?', then patient will constantly think about headaches, thus making them worse. Goal is to re-direct attention away from headaches, toward daily activities and other distractions.   10. Helpful Websites: www.AmericanHeadacheSociety.org PatentHood.ch www.headaches.org TightMarket.nl www.achenet.org

## 2022-08-30 NOTE — Telephone Encounter (Signed)
Phone room please call and relay the msg from amy:  We usually do not provide a letter for missing work unless seen in the office. He can schedule an appt with me if I have availability or see urgent care.
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
## 2022-09-02 ENCOUNTER — Other Ambulatory Visit: Payer: Self-pay | Admitting: Family Medicine

## 2022-09-05 ENCOUNTER — Encounter (INDEPENDENT_AMBULATORY_CARE_PROVIDER_SITE_OTHER): Payer: Self-pay

## 2022-09-23 ENCOUNTER — Other Ambulatory Visit: Payer: Self-pay | Admitting: Family Medicine

## 2022-09-25 ENCOUNTER — Encounter: Payer: Self-pay | Admitting: Internal Medicine

## 2022-09-25 ENCOUNTER — Ambulatory Visit: Payer: 59 | Admitting: Internal Medicine

## 2022-09-25 VITALS — BP 128/76 | HR 65 | Temp 98.5°F | Ht 68.0 in | Wt 201.0 lb

## 2022-09-25 DIAGNOSIS — E785 Hyperlipidemia, unspecified: Secondary | ICD-10-CM | POA: Diagnosis not present

## 2022-09-25 DIAGNOSIS — E559 Vitamin D deficiency, unspecified: Secondary | ICD-10-CM

## 2022-09-25 DIAGNOSIS — Z125 Encounter for screening for malignant neoplasm of prostate: Secondary | ICD-10-CM

## 2022-09-25 DIAGNOSIS — I10 Essential (primary) hypertension: Secondary | ICD-10-CM | POA: Diagnosis not present

## 2022-09-25 DIAGNOSIS — Z7985 Long-term (current) use of injectable non-insulin antidiabetic drugs: Secondary | ICD-10-CM

## 2022-09-25 DIAGNOSIS — E538 Deficiency of other specified B group vitamins: Secondary | ICD-10-CM

## 2022-09-25 DIAGNOSIS — I499 Cardiac arrhythmia, unspecified: Secondary | ICD-10-CM | POA: Diagnosis not present

## 2022-09-25 DIAGNOSIS — Z7984 Long term (current) use of oral hypoglycemic drugs: Secondary | ICD-10-CM

## 2022-09-25 DIAGNOSIS — E119 Type 2 diabetes mellitus without complications: Secondary | ICD-10-CM

## 2022-09-25 DIAGNOSIS — B351 Tinea unguium: Secondary | ICD-10-CM

## 2022-09-25 DIAGNOSIS — F419 Anxiety disorder, unspecified: Secondary | ICD-10-CM

## 2022-09-25 MED ORDER — TERBINAFINE HCL 250 MG PO TABS: 250.0000 mg | ORAL_TABLET | Freq: Every day | ORAL | 0 refills | Status: DC

## 2022-09-25 NOTE — Progress Notes (Signed)
Patient ID: Jason Ortega, male   DOB: 1965/04/28, 57 y.o.   MRN: [id number]        Chief Complaint: follow up HTN, HLD and hyperglycemia , toenail fungus left great nail, irreg heart beat, low vit d       HPI:  Jason Ortega is a 57 y.o. male here with c/o worsening left great nail fungus, Pt denies chest pain, increased sob or doe, wheezing, orthopnea, PND, increased LE swelling, palpitations, dizziness or syncope.   Pt denies polydipsia, polyuria, or new focal neuro s/s.    Pt denies fever, wt loss, night sweats, loss of appetite, or other constitutional symptoms  Did also have "irreg heart beat" notification by his watch a few days ago.          Wt Readings from Last 3 Encounters:  09/27/22 202 lb (91.6 kg)  09/25/22 201 lb (91.2 kg)  08/30/22 204 lb (92.5 kg)   BP Readings from Last 3 Encounters:  09/27/22 138/87  09/25/22 128/76  08/30/22 (!) 142/90         Past Medical History:  Diagnosis Date   Allergy    Anemia    Arthritis    Cataract    bilateral cataracts removed   Depression    Diabetes mellitus    Frequent headaches    GERD (gastroesophageal reflux disease)    Glaucoma    Hypercholesteremia    Hypertension    Migraines    Sleep apnea    wears c-PAP   Past Surgical History:  Procedure Laterality Date   bilateral cataracts removed     CARDIAC CATHETERIZATION  12/2011   COLONOSCOPY     LEFT HEART CATHETERIZATION WITH CORONARY ANGIOGRAM N/A 11/12/2011   Procedure: LEFT HEART CATHETERIZATION WITH CORONARY ANGIOGRAM;  Surgeon: Pamella Pert, MD;  Location: [facility] CATH LAB;  Service: Cardiovascular;  Laterality: N/A;   SHOULDER SURGERY      reports that he has never smoked. He has never used smokeless tobacco. He reports current alcohol use. He reports that he does not use drugs. family history includes Arthritis in his mother; Cancer in his father; Diabetes in his father and maternal grandfather; Hypertension in his father; Stroke in his mother. Allergies  Allergen  Reactions   Pollen Extract Other (See Comments)    Stuffy nose   Current Outpatient Medications on File Prior to Visit  Medication Sig Dispense Refill   aspirin EC 325 MG tablet Take 1 tablet (325 mg total) by mouth daily. 30 tablet 99   atorvastatin (LIPITOR) 40 MG tablet TAKE 1 TABLET BY MOUTH EVERY DAY 90 tablet 3   Blood Glucose Monitoring Suppl (ONETOUCH VERIO) w/Device KIT 1 Device by Does not apply route 2 (two) times daily. E11.9 1 kit 0   Blood Pressure Monitoring (BLOOD PRESSURE DIGITAL SOLN) KIT      chlorproMAZINE (THORAZINE) 25 MG tablet Take 1 tablet (25 mg total) by mouth 3 (three) times daily as needed. 30 tablet 0   diclofenac sodium (VOLTAREN) 1 % GEL Apply 2 g topically 4 (four) times daily. 200 g 5   dicyclomine (BENTYL) 10 MG capsule TAKE 1 CAPSULE (10 MG TOTAL) BY MOUTH 4 (FOUR) TIMES DAILY - BEFORE MEALS AND AT BEDTIME. 90 capsule 3   FARXIGA 10 MG TABS tablet TAKE 1 TABLET BY MOUTH EVERY DAY 90 tablet 1   gabapentin (NEURONTIN) 300 MG capsule TAKE 1 CAPSULE BY MOUTH EVERYDAY AT BEDTIME 90 capsule 0   Galcanezumab-gnlm (EMGALITY) 120 MG/ML SOAJ  Inject 120 mg into the skin every 30 (thirty) days. 3 mL 3   glucose blood (ONETOUCH VERIO) test strip 1 each by Other route 2 (two) times daily. E11.9     ibuprofen (ADVIL,MOTRIN) 600 MG tablet TAKE 1 TABLET(600 MG) BY MOUTH EVERY 8 HOURS AS NEEDED 30 tablet 0   losartan-hydrochlorothiazide (HYZAAR) 100-25 MG tablet TAKE 1 TABLET BY MOUTH EVERY DAY 90 tablet 3   meloxicam (MOBIC) 15 MG tablet TAKE 1 TABLET BY MOUTH DAILY. 30 tablet 4   metFORMIN (GLUCOPHAGE) 1000 MG tablet TAKE 1 TABLET (1,000 MG TOTAL) BY MOUTH TWICE A DAY WITH FOOD 180 tablet 3   methocarbamol (ROBAXIN) 500 MG tablet TAKE 1 TABLET BY MOUTH TWICE DAILY 20 tablet 0   methylPREDNISolone (MEDROL DOSEPAK) 4 MG TBPK tablet Taper pack as directed. 1 each 0   OneTouch Delica Lancets 30G MISC 1 each by Does not apply route 2 (two) times daily. E11.9     pantoprazole  (PROTONIX) 40 MG tablet Take 1 tablet (40 mg total) by mouth daily. 90 tablet 3   pantoprazole (PROTONIX) 40 MG tablet Take 1 tablet (40 mg total) by mouth 2 (two) times daily. 180 tablet 3   PENNSAID 2 % SOLN SMARTSIG:2 Pump Topical Twice Daily     propranolol (INDERAL) 20 MG tablet Take 1 tablet (20 mg total) by mouth at bedtime. 90 tablet 3   Rimegepant Sulfate (NURTEC) 75 MG TBDP Take 1 tablet (75 mg total) by mouth as needed (Take 1 tablet at onset of migraine ( Do not exceed more than 1 tablet in 24 hours)). 8 tablet 11   rizatriptan (MAXALT-MLT) 10 MG disintegrating tablet TAKE 1 TABLET BY MOUTH AS NEEDED FOR MIGRAINE. MAY REPEAT IN 2 HOURS IF NEEDED 9 tablet 11   Semaglutide, 2 MG/DOSE, (OZEMPIC, 2 MG/DOSE,) 8 MG/3ML SOPN INJECT 2 MG INTO THE SKIN ONCE A WEEK. 9 mL 1   sildenafil (REVATIO) 20 MG tablet Take 20 mg by mouth. 3-5 tabs prn     sucralfate (CARAFATE) 1 g tablet TAKE 1 TABLET (1 G TOTAL) BY MOUTH 4 TIMES A DAY WITH MEALS AND AT BEDTIME 120 tablet 0   timolol (TIMOPTIC-XR) 0.5 % ophthalmic gel-forming Place 1 drop into both eyes daily.      verapamil (CALAN SR) 240 MG CR tablet Take 1 tablet (240 mg total) by mouth at bedtime. 90 tablet 3   No current facility-administered medications on file prior to visit.        ROS:  All others reviewed and negative.  Objective        PE:  BP 128/76 (BP Location: Right Arm, Patient Position: Sitting, Cuff Size: Normal)   Pulse 65   Temp 98.5 F (36.9 C) (Oral)   Ht 5\' 8"  (1.727 m)   Wt 201 lb (91.2 kg)   SpO2 99%   BMI 30.56 kg/m                 Constitutional: Pt appears in NAD               HENT: Head: NCAT.                Right Ear: External ear normal.                 Left Ear: External ear normal.                Eyes: . Pupils are equal, round, and reactive to light. Conjunctivae  and EOM are normal               Nose: without d/c or deformity               Neck: Neck supple. Gross normal ROM               Cardiovascular:  Normal rate and regular rhythm.                 Pulmonary/Chest: Effort normal and breath sounds without rales or wheezing.                Abd:  Soft, NT, ND, + BS, no organomegaly               Neurological: Pt is alert. At baseline orientation, motor grossly intact               Skin: Skin is warm. No rashes, no other new lesions, LE edema - none               Psychiatric: Pt behavior is normal without agitation   Micro: none  Cardiac tracings I have personally interpreted today:  ECG - SR with PACs  Pertinent Radiological findings (summarize): none   Lab Results  Component Value Date   WBC 5.9 03/26/2022   HGB 13.4 03/26/2022   HCT 41.7 03/26/2022   PLT 302.0 03/26/2022   GLUCOSE 91 03/26/2022   CHOL 152 03/26/2022   TRIG 113.0 03/26/2022   HDL 63.00 03/26/2022   LDLCALC 66 03/26/2022   ALT 19 03/26/2022   AST 19 03/26/2022   NA 138 03/26/2022   K 4.0 03/26/2022   CL 98 03/26/2022   CREATININE 1.02 03/26/2022   BUN 13 03/26/2022   CO2 32 03/26/2022   TSH 1.09 03/26/2022   PSA 0.86 03/26/2022   HGBA1C 6.6 (H) 03/26/2022   MICROALBUR 1.4 03/26/2022   Assessment/Plan:  Jason Ortega is a 57 y.o. Black or African American [2] male with  has a past medical history of Allergy, Anemia, Arthritis, Cataract, Depression, Diabetes mellitus, Frequent headaches, GERD (gastroesophageal reflux disease), Glaucoma, Hypercholesteremia, Hypertension, Migraines, and Sleep apnea.  Hyperlipidemia LDL goal <100 Lab Results  Component Value Date   LDLCALC 66 03/26/2022   Stable, pt to continue current statin oipitor 40 qd   Hypertension, uncontrolled BP Readings from Last 3 Encounters:  09/27/22 138/87  09/25/22 128/76  08/30/22 (!) 142/90   Stable, pt to continue medical treatment hyzaar 100 - 25 every day, calan sr 240 qd   Irregular heartbeat Ecg reviewed - PACs noted, likely cause of his watch notification, asympt, ok to  to f/u any worsening symptoms or  concerns  Onychomycosis Left great nail - for lamisil 250 every day x 12 wks with LFTs at 6 wks  Type 2 diabetes mellitus without complication, without long-term current use of insulin (HCC) Lab Results  Component Value Date   HGBA1C 6.6 (H) 03/26/2022   Stable, pt to continue current medical treatment farxiga 10 every day, metformin 1000 bid and f/u lab next lab draw   Vitamin D deficiency Last vitamin D Lab Results  Component Value Date   VD25OH 23.09 (L) 03/26/2022   Low, to start oral replacement  Followup: Return in about 6 months (around 03/28/2023).  Oliver Barre, MD 09/28/2022 10:21 PM [facility]

## 2022-09-25 NOTE — Patient Instructions (Addendum)
Please take all new medication as prescribed - the lamisil for the toenail  Your EKG was done - and only a few extra beats that are not harmful were found  Please continue all other medications as before, and refills have been done if requested.  Please have the pharmacy call with any other refills you may need.  Please continue your efforts at being more active, low cholesterol diet, and weight control.  You are otherwise up to date with prevention measures today.  Please keep your appointments with your specialists as you may have planned  In 6 weeks - Please go to the LAB at the blood drawing area for the tests to be done  You will be contacted by phone if any changes need to be made immediately.  Otherwise, you will receive a letter about your results with an explanation, but please check with MyChart first.  Please make an Appointment to return in 6 months, or sooner if needed, also with Lab Appointment for testing done 3-5 days before at the FIRST FLOOR Lab (so this is for TWO appointments - please see the scheduling desk as you leave)

## 2022-09-27 ENCOUNTER — Encounter: Payer: Self-pay | Admitting: Neurology

## 2022-09-27 ENCOUNTER — Ambulatory Visit (INDEPENDENT_AMBULATORY_CARE_PROVIDER_SITE_OTHER): Payer: 59 | Admitting: Neurology

## 2022-09-27 VITALS — BP 138/87 | HR 71 | Ht 68.0 in | Wt 202.0 lb

## 2022-09-27 DIAGNOSIS — R634 Abnormal weight loss: Secondary | ICD-10-CM | POA: Diagnosis not present

## 2022-09-27 DIAGNOSIS — G4733 Obstructive sleep apnea (adult) (pediatric): Secondary | ICD-10-CM

## 2022-09-27 DIAGNOSIS — R519 Headache, unspecified: Secondary | ICD-10-CM

## 2022-09-27 DIAGNOSIS — G4719 Other hypersomnia: Secondary | ICD-10-CM

## 2022-09-27 IMAGING — MR MR HEAD W/O CM
10 series · 48 of 48 positions shown · non-contrast
Comparison: No pertinent prior exams available for comparison.

CLINICAL DATA: TIA (transient ischemic attack) LZX.O ([id number]).
Additional history provided by scanning technologist: Patient
reports and episode lasting approximately 1 minute with inability to
talk or move, episode approximately 2 weeks ago, migraine headaches.

EXAM:
MRI HEAD WITHOUT CONTRAST
TECHNIQUE: Multiplanar, multiecho pulse sequences of the brain and surrounding
structures were obtained without intravenous contrast.

[Series 2: T1 · sagittal · 5.0mm · 0.45mm/px · 1 of 21 slices shown]
[im 1/21]
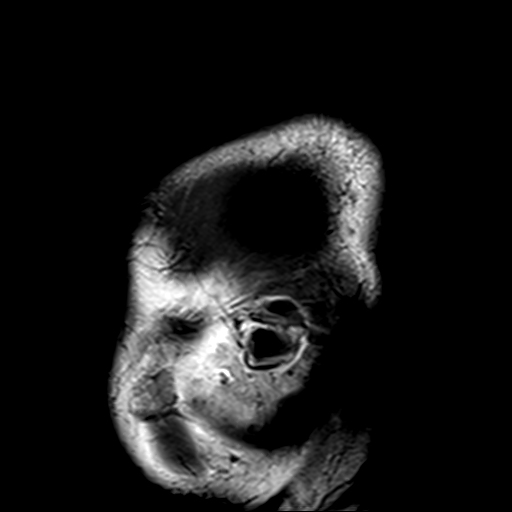

[Series 3: DWI · axial · 3.0mm · 1.80mm/px · z∈[-48,+98]mm · 9 of 100 slices shown (1 of 4)]
[im 1/100]
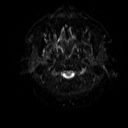
[im 13/100]
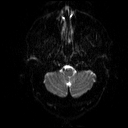
[im 25/100]
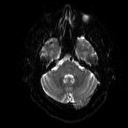
[im 38/100]
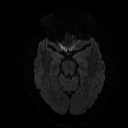
[im 50/100]
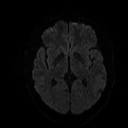
[im 62/100]
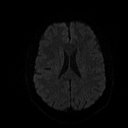
[im 75/100]
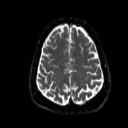
[im 87/100]
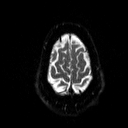
[im 100/100]
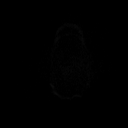

[Series 4: DWI · axial · 3.0mm · 1.80mm/px · z∈[-48,+98]mm · 4 of 46 slices shown (2 of 4)]
[im 1/46]
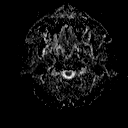
[im 16/46]
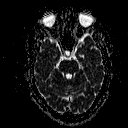
[im 31/46]
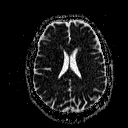
[im 46/46]
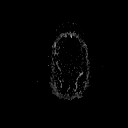

[Series 5: DWI · coronal · 5.0mm · 1.80mm/px · 7 of 76 slices shown (3 of 4)]
[im 1/76]
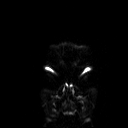
[im 13/76]
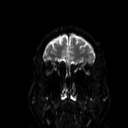
[im 26/76]
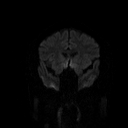
[im 38/76]
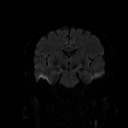
[im 51/76]
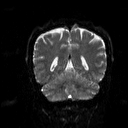
[im 63/76]
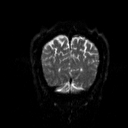
[im 76/76]
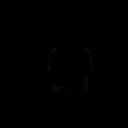

[Series 6: DWI · coronal · 5.0mm · 1.80mm/px · 3 of 38 slices shown (4 of 4)]
[im 1/38]
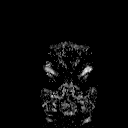
[im 19/38]
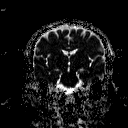
[im 38/38]
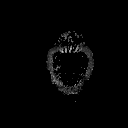

[Series 7: T2 · axial · 5.0mm · 0.90mm/px · z∈[-47,+99]mm · 2 of 22 slices shown (1 of 2)]
[im 1/22]
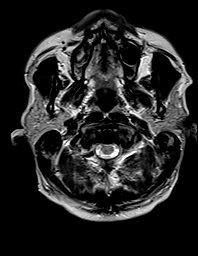
[im 22/22]
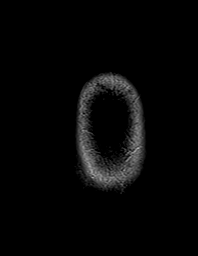

[Series 8: FLAIR · axial · 3.0mm · 0.45mm/px · z∈[-42,+92]mm · 3 of 30 slices shown]
[im 1/30]
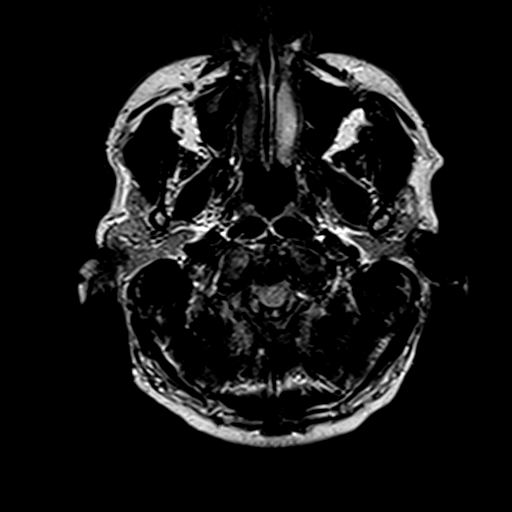
[im 15/30]
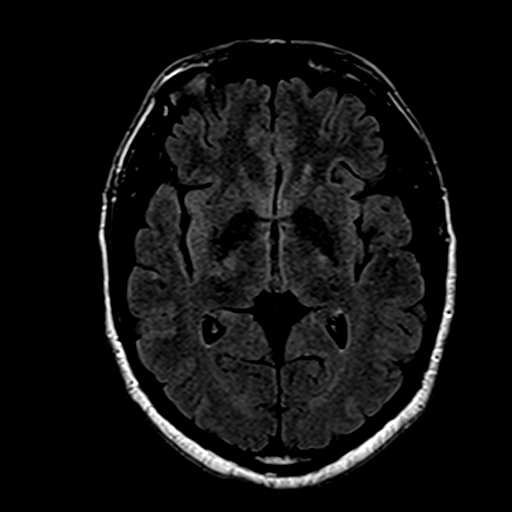
[im 30/30]
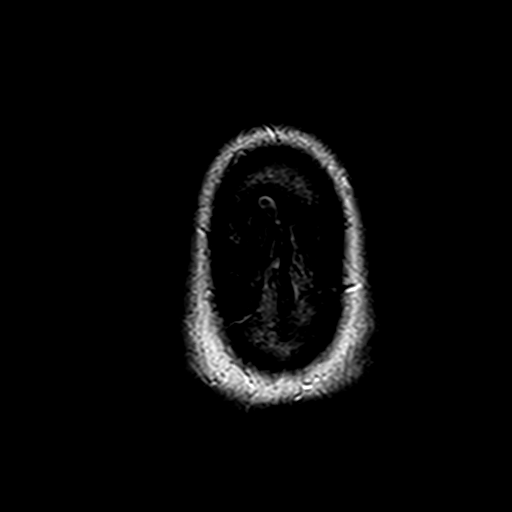

[Series 10: swi_images · axial · 4.0mm · 0.90mm/px · z∈[-45,+95]mm · 3 of 36 slices shown]
[im 1/36]
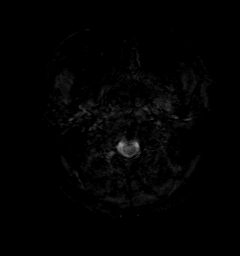
[im 18/36]
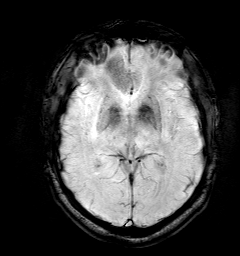
[im 36/36]
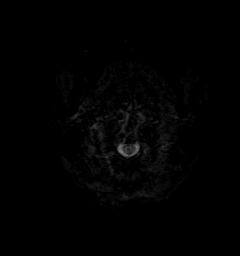

[Series 11: t1_mpr_tra · axial · 1.0mm · 0.71mm/px · z∈[-45,+97]mm · 13 of 144 slices shown]
[im 1/144]
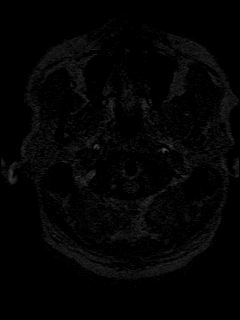
[im 12/144]
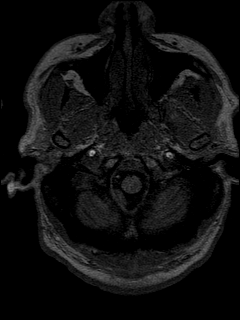
[im 24/144]
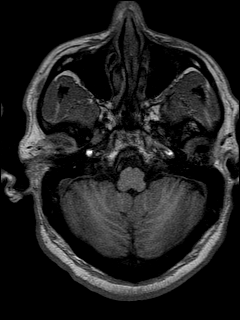
[im 36/144]
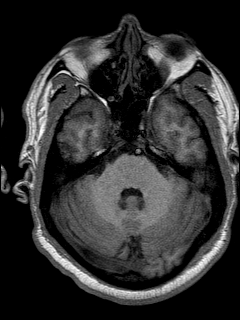
[im 48/144]
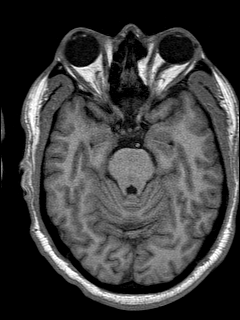
[im 60/144]
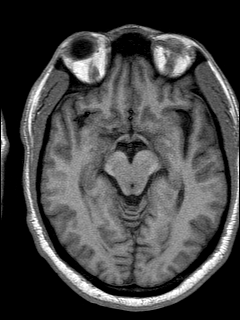
[im 72/144]
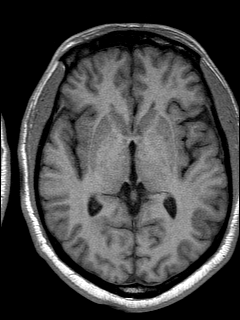
[im 84/144]
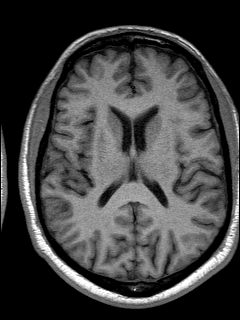
[im 96/144]
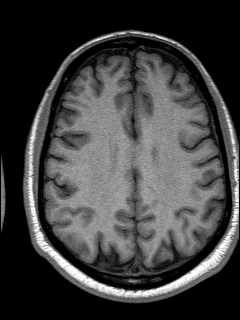
[im 108/144]
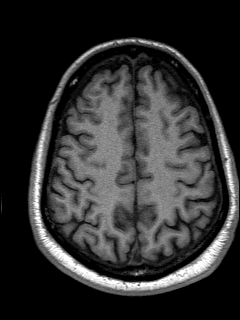
[im 120/144]
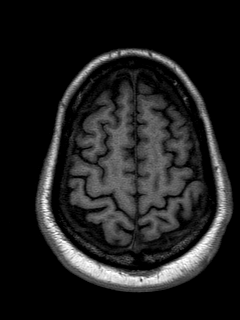
[im 132/144]
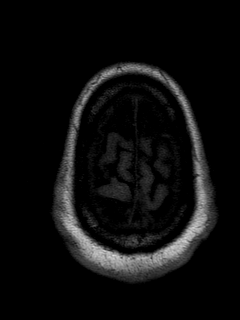
[im 144/144]
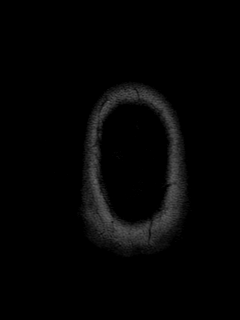

[Series 12: T2 · coronal · 5.0mm · 0.45mm/px · 3 of 30 slices shown (2 of 2)]
[im 1/30]
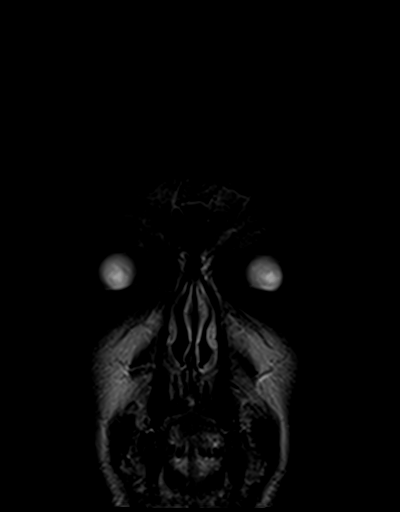
[im 15/30]
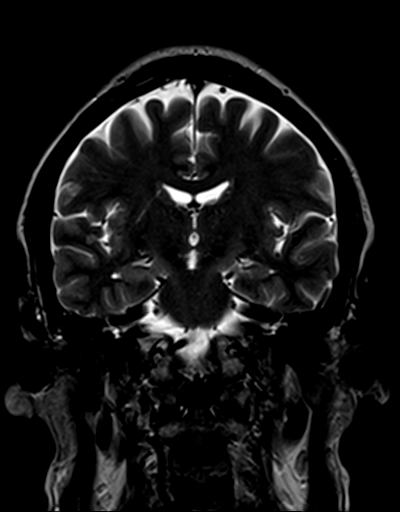
[im 30/30]
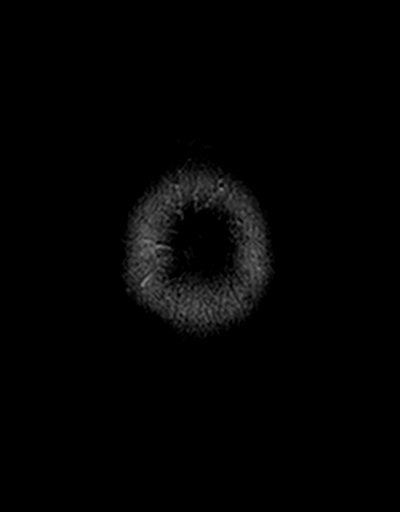

[48 of 48 positions shown; findings below may reference images not displayed]

FINDINGS: Brain:

Cerebral volume is normal for age.

No cortical encephalomalacia is identified. No significant cerebral
white matter disease for age.

There is no acute infarct.

No evidence of an intracranial mass.

No chronic intracranial blood products.

No extra-axial fluid collection.

No midline shift.

Vascular: Expected proximal arterial flow voids.

Skull and upper cervical spine: No focal marrow lesion. Incompletely
assessed cervical spondylosis.

Sinuses/Orbits: Visualized orbits show no acute finding. Presumed
chronic medially displaced fracture deformity of the left lamina
papyracea. Small right maxillary sinus mucous retention cyst.
IMPRESSION: Unremarkable non-contrast MRI appearance of the brain for age. No
evidence of acute intracranial abnormality.

## 2022-09-27 NOTE — Patient Instructions (Signed)
It was nice to meet you today.   As discussed, we will proceed with a home sleep test (HST) to re-assess your sleep apnea and to see if you still should use your PAP machine. Our sleep lab staff will reach out to you to arrange for pickup and for tutorial of your test equipment - you will do the test at home that night and bring the test sensors back for data analysis the next day or whenever you are scheduled for drop off of your test equipment. I will write for a new machine after your HST confirms your obstructive sleep apnea diagnosis.   Please remember, you will not use your CPAP the night of your testing.  This is so we get diagnostic data, we do not need treatment data.  We will call you with the results.   After you have done your home sleep test, you can resume using your AutoPap machine.    For now, please continue using your current PAP regularly. While your insurance requires that you use PAP at least 4 hours each night on 70% of the nights, I recommend, that you not skip any nights and use it throughout the night if you can. Getting used to CPAP or AutoPap and staying with the treatment long term does take time and patience and discipline. Untreated obstructive sleep apnea when it is moderate to severe can have an adverse impact on cardiovascular health and raise her risk for heart disease, arrhythmias, hypertension, congestive heart failure, stroke and diabetes. Untreated obstructive sleep apnea causes sleep disruption, nonrestorative sleep, and sleep deprivation. This can have an impact on your day to day functioning and cause daytime sleepiness and impairment of cognitive function, memory loss, mood disturbance, and problems focussing. Using CPAP/AutoPap regularly can improve these symptoms.  Please continue to work on weight loss.  You have used a significant amount of weight thus far.  Keep up the good work!

## 2022-09-27 NOTE — Progress Notes (Signed)
Subjective:    Patient ID: Jason Ortega is a 57 y.o. male.  HPI    Huston Foley, MD, PhD [facility] [address], [address] [address] [place], [place] [address]  Dear Linton Rump and Desma Maxim,  I saw your patient, Jason Ortega, upon your kind request in my sleep clinic today for initial consultation of his sleep disorder, in particular, evaluation of his prior diagnosis of obstructive sleep apnea.  The patient is unaccompanied today.  As you know, Jason Ortega is a 57 year old male with an underlying medical history of migraine headaches, allergies, anemia, arthritis, depression, diabetes, reflux disease, hypertension, hyperlipidemia, and mild obesity, who reports using PAP for the past 10 years.  He admits that he is not consistent with the machine as he reports working long hours and often falls asleep without it.  He had sleep testing through [facility] over 10 years ago and has not seen a sleep specialist or provider in several years.  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  Bedtime is generally around 11 and rise time around 5.  Over the course of time he has lost quite a bit of weight, since his original sleep apnea diagnosis probably in the realm of 40 pounds.  He has an Epworth sleepiness score of 19 out of 24, fatigue severity score is 43 out of 63.  He is not aware of any family history of sleep apnea.  He drinks caffeine in the form of tea and soda, up to 2 servings per day, occasional or rare alcohol, he is a non-smoker.  He lives alone, he has no pets at the house.  He denies nightly nocturia but has occasional morning headaches.  I reviewed your office note from 08/30/2022.  He would like to get a new CPAP or AutoPap machine.  He did not bring his machine today.  Prior test results are not available for my review today.  A compliance download from his previous Pap machine is not available  for my review today.  His Past Medical History Is Significant For: Past Medical History:  Diagnosis Date   Allergy    Anemia    Arthritis    Cataract    bilateral cataracts removed   Depression    Diabetes mellitus    Frequent headaches    GERD (gastroesophageal reflux disease)    Glaucoma    Hypercholesteremia    Hypertension    Migraines    Sleep apnea    wears c-PAP    His Past Surgical History Is Significant For: Past Surgical History:  Procedure Laterality Date   bilateral cataracts removed     CARDIAC CATHETERIZATION  12/2011   COLONOSCOPY     LEFT HEART CATHETERIZATION WITH CORONARY ANGIOGRAM N/A 11/12/2011   Procedure: LEFT HEART CATHETERIZATION WITH CORONARY ANGIOGRAM;  Surgeon: Pamella Pert, MD;  Location: [facility] CATH LAB;  Service: Cardiovascular;  Laterality: N/A;   SHOULDER SURGERY      His Family History Is Significant For: Family History  Problem Relation Age of Onset   Arthritis Mother    Stroke Mother    Hypertension Father    Diabetes Father    Cancer Father        Multiple Myleoma   Diabetes Maternal Grandfather    Colon cancer Neg Hx    Esophageal cancer Neg Hx    Rectal cancer Neg Hx  Stomach cancer Neg Hx    Sleep apnea Neg Hx     His Social History Is Significant For: Social History   Socioeconomic History   Marital status: Single    Spouse name: Not on file   Number of children: 2   Years of education: 13   Highest education level: Not on file  Occupational History   Occupation: Nature conservation officer  Tobacco Use   Smoking status: Never   Smokeless tobacco: Never  Vaping Use   Vaping status: Never Used  Substance and Sexual Activity   Alcohol use: Yes    Comment: occasional   Drug use: No   Sexual activity: Yes    Birth control/protection: None  Other Topics Concern   Not on file  Social History Narrative   Fun/Hobby: Psychologist, occupational football   Social Determinants of Health   Financial Resource Strain: Not on file  Food Insecurity: Not  on file  Transportation Needs: Not on file  Physical Activity: Not on file  Stress: Not on file  Social Connections: Not on file    His Allergies Are:  Allergies  Allergen Reactions   Pollen Extract Other (See Comments)    Stuffy nose  :   His Current Medications Are:  Outpatient Encounter Medications as of 09/27/2022  Medication Sig   aspirin EC 325 MG tablet Take 1 tablet (325 mg total) by mouth daily.   atorvastatin (LIPITOR) 40 MG tablet TAKE 1 TABLET BY MOUTH EVERY DAY   Blood Glucose Monitoring Suppl (ONETOUCH VERIO) w/Device KIT 1 Device by Does not apply route 2 (two) times daily. E11.9   Blood Pressure Monitoring (BLOOD PRESSURE DIGITAL SOLN) KIT    chlorproMAZINE (THORAZINE) 25 MG tablet Take 1 tablet (25 mg total) by mouth 3 (three) times daily as needed.   diclofenac sodium (VOLTAREN) 1 % GEL Apply 2 g topically 4 (four) times daily.   dicyclomine (BENTYL) 10 MG capsule TAKE 1 CAPSULE (10 MG TOTAL) BY MOUTH 4 (FOUR) TIMES DAILY - BEFORE MEALS AND AT BEDTIME.   FARXIGA 10 MG TABS tablet TAKE 1 TABLET BY MOUTH EVERY DAY   gabapentin (NEURONTIN) 300 MG capsule TAKE 1 CAPSULE BY MOUTH EVERYDAY AT BEDTIME   Galcanezumab-gnlm (EMGALITY) 120 MG/ML SOAJ Inject 120 mg into the skin every 30 (thirty) days.   glucose blood (ONETOUCH VERIO) test strip 1 each by Other route 2 (two) times daily. E11.9   ibuprofen (ADVIL,MOTRIN) 600 MG tablet TAKE 1 TABLET(600 MG) BY MOUTH EVERY 8 HOURS AS NEEDED   losartan-hydrochlorothiazide (HYZAAR) 100-25 MG tablet TAKE 1 TABLET BY MOUTH EVERY DAY   meloxicam (MOBIC) 15 MG tablet TAKE 1 TABLET BY MOUTH DAILY.   metFORMIN (GLUCOPHAGE) 1000 MG tablet TAKE 1 TABLET (1,000 MG TOTAL) BY MOUTH TWICE A DAY WITH FOOD   methocarbamol (ROBAXIN) 500 MG tablet TAKE 1 TABLET BY MOUTH TWICE DAILY   methylPREDNISolone (MEDROL DOSEPAK) 4 MG TBPK tablet Taper pack as directed.   OneTouch Delica Lancets 30G MISC 1 each by Does not apply route 2 (two) times daily.  E11.9   pantoprazole (PROTONIX) 40 MG tablet Take 1 tablet (40 mg total) by mouth daily.   pantoprazole (PROTONIX) 40 MG tablet Take 1 tablet (40 mg total) by mouth 2 (two) times daily.   PENNSAID 2 % SOLN SMARTSIG:2 Pump Topical Twice Daily   propranolol (INDERAL) 20 MG tablet Take 1 tablet (20 mg total) by mouth at bedtime.   Rimegepant Sulfate (NURTEC) 75 MG TBDP Take 1 tablet (75 mg  total) by mouth as needed (Take 1 tablet at onset of migraine ( Do not exceed more than 1 tablet in 24 hours)).   rizatriptan (MAXALT-MLT) 10 MG disintegrating tablet TAKE 1 TABLET BY MOUTH AS NEEDED FOR MIGRAINE. MAY REPEAT IN 2 HOURS IF NEEDED   Semaglutide, 2 MG/DOSE, (OZEMPIC, 2 MG/DOSE,) 8 MG/3ML SOPN INJECT 2 MG INTO THE SKIN ONCE A WEEK.   sildenafil (REVATIO) 20 MG tablet Take 20 mg by mouth. 3-5 tabs prn   sucralfate (CARAFATE) 1 g tablet TAKE 1 TABLET (1 G TOTAL) BY MOUTH 4 TIMES A DAY WITH MEALS AND AT BEDTIME   terbinafine (LAMISIL) 250 MG tablet Take 1 tablet (250 mg total) by mouth daily.   timolol (TIMOPTIC-XR) 0.5 % ophthalmic gel-forming Place 1 drop into both eyes daily.    verapamil (CALAN SR) 240 MG CR tablet Take 1 tablet (240 mg total) by mouth at bedtime.   No facility-administered encounter medications on file as of 09/27/2022.  :   Review of Systems:  Out of a complete 14 point review of systems, all are reviewed and negative with the exception of these symptoms as listed below:          Review of Systems  Neurological:        Pt here for sleep consult   Pt snores,fatigue,headaches,hypertension Pt has CPAP machine wears almost everynight Pt had sleep study 10 years ago    ESS FSS     Objective:  Neurological Exam  Physical Exam Physical Examination:   Vitals:   09/27/22 1138  BP: 138/87  Pulse: 71    General Examination: The patient is a very pleasant 57 y.o. male in no acute distress. He appears well-developed and well-nourished and well groomed.   HEENT:  Normocephalic, atraumatic, pupils are equal, round and reactive to light, extraocular tracking is good without limitation to gaze excursion or nystagmus noted. Hearing is grossly intact. Face is symmetric with normal facial animation. Speech is clear with no dysarthria noted. There is no hypophonia. There is no lip, neck/head, jaw or voice tremor. Neck is supple with full range of passive and active motion. There are no carotid bruits on auscultation. Oropharynx exam reveals: mild mouth dryness, adequate dental hygiene and moderate airway crowding, due to small airway entry, tonsils on the smaller side, slightly prominent uvula, Mallampati class III, neck circumference 16 inches, slight underbite noted.  Tongue protrudes centrally and palate elevates symmetrically.  Chest: Clear to auscultation without wheezing, rhonchi or crackles noted.  Heart: S1+S2+0, regular and normal without murmurs, rubs or gallops noted.   Abdomen: Soft, non-tender and non-distended.  Extremities: There is no pitting edema in the distal lower extremities bilaterally.   Skin: Warm and dry without trophic changes noted.   Musculoskeletal: exam reveals no obvious joint deformities.   Neurologically:  Mental status: The patient is awake, alert and oriented in all 4 spheres. His immediate and remote memory, attention, language skills and fund of knowledge are appropriate. There is no evidence of aphasia, agnosia, apraxia or anomia. Speech is clear with normal prosody and enunciation. Thought process is linear. Mood is normal and affect is normal.  Cranial nerves II - XII are as described above under HEENT exam.  Motor exam: Normal bulk, strength and tone is noted. There is no obvious action or resting tremor.  Fine motor skills and coordination: grossly intact.  Cerebellar testing: No dysmetria or intention tremor. There is no truncal or gait ataxia.  Sensory exam: intact to  light touch in the upper and lower extremities.   Gait, station and balance: He stands easily. No veering to one side is noted. No leaning to one side is noted. Posture is age-appropriate and stance is narrow based. Gait shows normal stride length and normal pace. No problems turning are noted.   Assessment:   In summary, Jason Ortega is a very pleasant 57 y.o.-year old male with an underlying medical history of migraine headaches, allergies, anemia, arthritis, depression, diabetes, reflux disease, hypertension, hyperlipidemia, and mild obesity, who presents for evaluation of his obstructive sleep apnea.  Of note, as he recalls, he was diagnosed with moderate obstructive sleep apnea several years ago and he has an older PAP machine.  He has worked on weight loss and has lost quite a bit of weight over the course of time.  He is willing to get reevaluated.  We mutually agreed to proceed with a home sleep test at this time.    I had a long chat with the patient about my findings and the diagnosis of sleep apnea, particularly OSA, its prognosis and treatment options. We talked about medical/conservative treatments, surgical interventions and non-pharmacological approaches for symptom control. I explained, in particular, the risks and ramifications of untreated moderate to severe OSA, especially with respect to developing cardiovascular disease down the road, including congestive heart failure (CHF), difficult to treat hypertension, cardiac arrhythmias (particularly A-fib), neurovascular complications including TIA, stroke and dementia. Even type 2 diabetes has, in part, been linked to untreated OSA. Symptoms of untreated OSA may include (but may not be limited to) daytime sleepiness, nocturia (i.e. frequent nighttime urination), memory problems, mood irritability and suboptimally controlled or worsening mood disorder such as depression and/or anxiety, lack of energy, lack of motivation, physical discomfort, as well as recurrent headaches, especially morning or  nocturnal headaches. We talked about the importance of maintaining a healthy lifestyle and striving for healthy weight.  He is strongly advised not to drive when feeling sleepy. I recommended a sleep study at this time. I outlined the differences between a laboratory attended sleep study which is considered more comprehensive and accurate over the option of a home sleep test (HST); the latter may lead to underestimation of sleep disordered breathing in some instances and does not help with diagnosing upper airway resistance syndrome and is not accurate enough to diagnose primary central sleep apnea typically. I outlined possible surgical and non-surgical treatment options of OSA, including the use of a positive airway pressure (PAP) device (i.e. CPAP, AutoPAP/APAP or BiPAP in certain circumstances), a custom-made dental device (aka oral appliance, which would require a referral to a specialist dentist or orthodontist typically, and is generally speaking not considered for patients with full dentures or edentulous state), upper airway surgical options, such as traditional UPPP (which is not considered a first-line treatment) or the Inspire device (hypoglossal nerve stimulator, which would involve a referral for consultation with an ENT surgeon, after careful selection, following inclusion criteria - also not first-line treatment). I explained the PAP treatment option to the patient in detail, as this is generally considered first-line treatment.  The patient indicated that he would be willing to continue with PAP therapy, if the need arises. I explained the importance of being compliant with PAP treatment, not only for insurance purposes but primarily to improve patient's symptoms symptoms, and for the patient's long term health benefit, including to reduce His cardiovascular risks longer-term.    We will pick up our discussion about the next steps and  treatment options after testing.  We will keep him posted as  to the test results by phone call and/or MyChart messaging where possible.  We will plan to follow-up in sleep clinic accordingly as well.  I answered all his questions today and the patient was in agreement.   I encouraged him to call with any interim questions, concerns, problems or updates or email [place] through MyChart.  Generally speaking, sleep test authorizations may take up to 2 weeks, sometimes less, sometimes longer, the patient is encouraged to get in touch with [place] if they do not hear back from the sleep lab staff directly within the next 2 weeks.  Thank you very much for allowing me to participate in the care of this nice patient. If I can be of any further assistance to you please do not hesitate to call me at [phone].  Sincerely,   Huston Foley, MD, PhD

## 2022-09-28 ENCOUNTER — Encounter: Payer: Self-pay | Admitting: Internal Medicine

## 2022-09-28 NOTE — Assessment & Plan Note (Signed)
Last vitamin D Lab Results  Component Value Date   VD25OH 23.09 (L) 03/26/2022   Low, to start oral replacement

## 2022-09-28 NOTE — Assessment & Plan Note (Signed)
Left great nail - for lamisil 250 every day x 12 wks with LFTs at 6 wks

## 2022-09-28 NOTE — Assessment & Plan Note (Signed)
Lab Results  Component Value Date   LDLCALC 66 03/26/2022   Stable, pt to continue current statin oipitor 40 qd

## 2022-09-28 NOTE — Assessment & Plan Note (Signed)
Ecg reviewed - PACs noted, likely cause of his watch notification, asympt, ok to  to f/u any worsening symptoms or concerns

## 2022-09-28 NOTE — Assessment & Plan Note (Signed)
BP Readings from Last 3 Encounters:  09/27/22 138/87  09/25/22 128/76  08/30/22 (!) 142/90   Stable, pt to continue medical treatment hyzaar 100 - 25 every day, calan sr 240 qd

## 2022-09-28 NOTE — Assessment & Plan Note (Signed)
Lab Results  Component Value Date   HGBA1C 6.6 (H) 03/26/2022   Stable, pt to continue current medical treatment farxiga 10 every day, metformin 1000 bid and f/u lab next lab draw

## 2022-10-07 ENCOUNTER — Other Ambulatory Visit: Payer: Self-pay | Admitting: Internal Medicine

## 2022-10-07 DIAGNOSIS — I10 Essential (primary) hypertension: Secondary | ICD-10-CM

## 2022-10-09 ENCOUNTER — Other Ambulatory Visit: Payer: Self-pay

## 2022-10-10 ENCOUNTER — Ambulatory Visit: Payer: 59 | Admitting: Neurology

## 2022-10-10 DIAGNOSIS — G4733 Obstructive sleep apnea (adult) (pediatric): Secondary | ICD-10-CM

## 2022-10-10 DIAGNOSIS — G4719 Other hypersomnia: Secondary | ICD-10-CM

## 2022-10-10 DIAGNOSIS — R519 Headache, unspecified: Secondary | ICD-10-CM

## 2022-10-10 DIAGNOSIS — R634 Abnormal weight loss: Secondary | ICD-10-CM

## 2022-10-18 NOTE — Procedures (Signed)
[facility]  HOME SLEEP TEST (Watch PAT) REPORT - Mail-out Device  STUDY DATE: 10/14/2022  DOB: [birth year]  MRN: [id number]  ORDERING CLINICIAN: Huston Foley, MD, PhD   REFERRING CLINICIAN: [name], NP  CLINICAL INFORMATION/HISTORY: 57 year old male with an underlying medical history of migraine headaches, allergies, anemia, arthritis, depression, diabetes, reflux disease, hypertension, hyperlipidemia, and mild obesity, who was previously diagnosed with obstructive sleep apnea and placed on PAP therapy.  He should be eligible for a new machine.  He presents for reevaluation of his obstructive sleep apnea.  Epworth sleepiness score: 19/24.  BMI: 30.7 kg/m  FINDINGS:   Sleep Summary:   Total Recording Time (hours, min): 7 hours, 53 min  Total Sleep Time (hours, min):  7 hours, 26 min  Percent REM (%):    16.9%   Respiratory Indices:   Calculated pAHI (per hour):  18.2/hour         REM pAHI:    24.4/hour       NREM pAHI: 16.9/hour  Central pAHI: 0.3/hour  Oxygen Saturation Statistics:    Oxygen Saturation (%) Mean: 94%   Minimum oxygen saturation (%):                 87%   O2 Saturation Range (%): 87 - 98%    O2 Saturation (minutes) <=88%: 0.5 min  Pulse Rate Statistics:   Pulse Mean (bpm):    70/min    Pulse Range (53 - 96/min)   IMPRESSION: OSA (obstructive sleep apnea)  RECOMMENDATION:  This home sleep test demonstrates moderate obstructive sleep apnea with a total AHI of 18.2/hour and O2 nadir of 87%.  Intermittent mild to moderate snoring was detected.  Ongoing treatment with a positive airway pressure (PAP) device is recommended. The patient has an older PAP machine.  I will write for a new AutoPap machine for home use. A full night titration study may be considered to optimize treatment settings, monitor proper oxygen saturations and aid with improvement of tolerance and adherence, if needed down the road. Alternative treatment  options may include a dental device through dentistry or orthodontics in selected patients or Inspire (hypoglossal nerve stimulator) in carefully selected patients (meeting inclusion criteria).  Concomitant weight loss is recommended (where clinically appropriate). Please note that untreated obstructive sleep apnea may carry additional perioperative morbidity. Patients with significant obstructive sleep apnea should receive perioperative PAP therapy and the surgeons and particularly the anesthesiologist should be informed of the diagnosis and the severity of the sleep disordered breathing. The patient should be cautioned not to drive, work at heights, or operate dangerous or heavy equipment when tired or sleepy. Review and reiteration of good sleep hygiene measures should be pursued with any patient. Other causes of the patient's symptoms, including circadian rhythm disturbances, an underlying mood disorder, medication effect and/or an underlying medical problem cannot be ruled out based on this test. Clinical correlation is recommended.  The patient and his referring provider will be notified of the test results. The patient will be seen in follow up in sleep clinic at [facility].  I certify that I have reviewed the raw data recording prior to the issuance of this report in accordance with the standards of the American Academy of Sleep Medicine (AASM).  INTERPRETING PHYSICIAN:   Huston Foley, MD, PhD Medical Director, [facility]) Diplomat, ABPN (Neurology and Sleep)   [facility] [address] [place], [place] [address] [phone]

## 2022-10-18 NOTE — Progress Notes (Signed)
See procedure note.

## 2022-10-18 NOTE — Addendum Note (Signed)
Addended by: Huston Foley on: 10/18/2022 05:14 PM   Modules accepted: Orders

## 2022-10-23 ENCOUNTER — Telehealth: Payer: Self-pay | Admitting: *Deleted

## 2022-10-23 ENCOUNTER — Other Ambulatory Visit: Payer: Self-pay | Admitting: Internal Medicine

## 2022-10-23 ENCOUNTER — Other Ambulatory Visit (INDEPENDENT_AMBULATORY_CARE_PROVIDER_SITE_OTHER): Payer: 59

## 2022-10-23 DIAGNOSIS — E538 Deficiency of other specified B group vitamins: Secondary | ICD-10-CM | POA: Diagnosis not present

## 2022-10-23 DIAGNOSIS — E559 Vitamin D deficiency, unspecified: Secondary | ICD-10-CM | POA: Diagnosis not present

## 2022-10-23 DIAGNOSIS — E119 Type 2 diabetes mellitus without complications: Secondary | ICD-10-CM | POA: Diagnosis not present

## 2022-10-23 DIAGNOSIS — Z125 Encounter for screening for malignant neoplasm of prostate: Secondary | ICD-10-CM

## 2022-10-23 LAB — LIPID PANEL
Cholesterol: 133 mg/dL (ref 0–200)
HDL: 59.4 mg/dL (ref >39.00)
LDL Cholesterol: 57 mg/dL (ref 0–99)
NonHDL: 73.31
Total CHOL/HDL Ratio: 2
Triglycerides: 84 mg/dL (ref 0.0–149.0)
VLDL: 16.8 mg/dL (ref 0.0–40.0)

## 2022-10-23 LAB — BASIC METABOLIC PANEL
BUN: 20 mg/dL (ref 6–23)
CO2: 30 meq/L (ref 19–32)
Calcium: 9.1 mg/dL (ref 8.4–10.5)
Chloride: 100 meq/L (ref 96–112)
Creatinine, Ser: 1.26 mg/dL (ref 0.40–1.50)
GFR: 62.64 mL/min (ref >60.00)
Glucose, Bld: 106 mg/dL — ABNORMAL HIGH (ref 70–99)
Potassium: 4.2 meq/L (ref 3.5–5.1)
Sodium: 138 meq/L (ref 135–145)

## 2022-10-23 LAB — HEPATIC FUNCTION PANEL
ALT: 16 U/L (ref 0–53)
AST: 19 U/L (ref 0–37)
Albumin: 3.9 g/dL (ref 3.5–5.2)
Alkaline Phosphatase: 52 U/L (ref 39–117)
Bilirubin, Direct: 0.1 mg/dL (ref 0.0–0.3)
Total Bilirubin: 0.4 mg/dL (ref 0.2–1.2)
Total Protein: 6.9 g/dL (ref 6.0–8.3)

## 2022-10-23 LAB — URINALYSIS, ROUTINE W REFLEX MICROSCOPIC
Bilirubin Urine: NEGATIVE
Hgb urine dipstick: NEGATIVE
Ketones, ur: NEGATIVE
Leukocytes,Ua: NEGATIVE
Nitrite: NEGATIVE
RBC / HPF: NONE SEEN (ref 0–?)
Specific Gravity, Urine: 1.02 (ref 1.000–1.030)
Total Protein, Urine: NEGATIVE
Urine Glucose: >=1000 — AB
Urobilinogen, UA: 1 (ref 0.0–1.0)
WBC, UA: NONE SEEN (ref 0–?)
pH: 6 (ref 5.0–8.0)

## 2022-10-23 LAB — CBC WITH DIFFERENTIAL/PLATELET
Basophils Absolute: 0 10*3/uL (ref 0.0–0.1)
Basophils Relative: 1 % (ref 0.0–3.0)
Eosinophils Absolute: 0.1 10*3/uL (ref 0.0–0.7)
Eosinophils Relative: 2.8 % (ref 0.0–5.0)
HCT: 41.1 % (ref 39.0–52.0)
Hemoglobin: 12.6 g/dL — ABNORMAL LOW (ref 13.0–17.0)
Lymphocytes Relative: 34.2 % (ref 12.0–46.0)
Lymphs Abs: 1.4 10*3/uL (ref 0.7–4.0)
MCHC: 30.7 g/dL (ref 30.0–36.0)
MCV: 81.2 fl (ref 78.0–100.0)
Monocytes Absolute: 0.5 10*3/uL (ref 0.1–1.0)
Monocytes Relative: 12 % (ref 3.0–12.0)
Neutro Abs: 2.1 10*3/uL (ref 1.4–7.7)
Neutrophils Relative %: 50 % (ref 43.0–77.0)
Platelets: 263 10*3/uL (ref 150.0–400.0)
RBC: 5.06 Mil/uL (ref 4.22–5.81)
RDW: 18.6 % — ABNORMAL HIGH (ref 11.5–15.5)
WBC: 4.2 10*3/uL (ref 4.0–10.5)

## 2022-10-23 LAB — MICROALBUMIN / CREATININE URINE RATIO
Creatinine,U: 95.9 mg/dL
Microalb Creat Ratio: 0.7 mg/g (ref 0.0–30.0)
Microalb, Ur: <0.7 mg/dL (ref 0.0–1.9)

## 2022-10-23 LAB — VITAMIN B12: Vitamin B-12: 228 pg/mL (ref 211–911)

## 2022-10-23 LAB — HEMOGLOBIN A1C: Hgb A1c MFr Bld: 6.7 % — ABNORMAL HIGH (ref 4.6–6.5)

## 2022-10-23 LAB — TSH: TSH: 0.74 u[IU]/mL (ref 0.35–5.50)

## 2022-10-23 LAB — PSA: PSA: 0.68 ng/mL (ref 0.10–4.00)

## 2022-10-23 LAB — VITAMIN D 25 HYDROXY (VIT D DEFICIENCY, FRACTURES): VITD: 37.88 ng/mL (ref 30.00–100.00)

## 2022-10-23 NOTE — Progress Notes (Signed)
The test results show that your current treatment is OK, as the tests are stable.  Please continue the same plan.  There is no other need for change of treatment or further evaluation based on these results, at this time.  thanks

## 2022-10-23 NOTE — Telephone Encounter (Signed)
-----   Message from Huston Foley sent at 10/18/2022  5:14 PM EDT ----- Patient referred by Amy L for reevaluation of his OSA, seen by me on 09/27/2022, patient had a HST on 10/14/2022.    Please call and notify the patient that the recent home sleep test showed obstructive sleep apnea in the moderate range.  I recommend ongoing treatment with an AutoPap machine.  Since he has an older model, I would be happy to write for a new machine for him. We will send the order to a local DME company (of his choice, or as per insurance requirement). The DME representative will fit him with a mask, educate him on how to use the machine, how to put the mask on, etc. I have placed an order in the chart. Please send the order, talk to patient, send report to referring MD. We will need a FU in sleep clinic for 10 weeks post-PAP set up, please arrange that with me or Amy L. Pls reinforce the need for compliance with treatment. Thanks,   Huston Foley, MD, PhD [facility])

## 2022-10-23 NOTE — Telephone Encounter (Signed)
Spoke with the patient and discussed the sleep study results as noted below by Dr. Frances Furbish.  The patient verbalized understanding is amenable to continuing with AutoPap.  He would like to continue using [facility] as DME.  I asked him to give [place] or [employer] a call if he does not hear from them within 1 week.  I will send the orders over to them.  We discussed the compliance requirements by insurance which includes using the machine for at least 4 hours at night and also being seen in our office for follow-up between 30 and 90 days after set up.  Patient scheduled a MyChart video visit with Amy NP for 01/10/2023 at 9 AM.  His questions were answered and he verbalized appreciation for the call.  Order sent to [facility].

## 2022-10-24 ENCOUNTER — Other Ambulatory Visit: Payer: Self-pay

## 2022-11-06 NOTE — Telephone Encounter (Signed)
Pt called, had not received a call from DME company to schedule appt to get CPAP machine.  Informed of the phone number to [facility].

## 2022-11-06 NOTE — Telephone Encounter (Addendum)
Sent a community message to [employer] to make them aware of pts concerns  Pt aware

## 2022-11-09 NOTE — Telephone Encounter (Signed)
Pt said still have not heard from [facility] to schedule appt. Would like a call from the nurse.

## 2022-11-11 ENCOUNTER — Other Ambulatory Visit: Payer: Self-pay | Admitting: Internal Medicine

## 2022-11-11 DIAGNOSIS — I10 Essential (primary) hypertension: Secondary | ICD-10-CM

## 2022-11-12 ENCOUNTER — Other Ambulatory Visit: Payer: Self-pay

## 2022-11-12 NOTE — Telephone Encounter (Signed)
I spoke with Terri @ [employer]. She told me they will call the patient this afternoon. I called the pt and let him know. I advised him to reach out to [facility] and [place] if he isn't called by this afternoon. He verbalized understanding and appreciation.

## 2022-11-22 ENCOUNTER — Telehealth: Payer: Self-pay

## 2022-11-22 ENCOUNTER — Other Ambulatory Visit (HOSPITAL_COMMUNITY): Payer: Self-pay

## 2022-11-22 ENCOUNTER — Encounter: Payer: Self-pay | Admitting: Hematology and Oncology

## 2022-11-22 NOTE — Telephone Encounter (Signed)
*  [facility]  Pharmacy Patient Advocate Encounter   Received notification from CoverMyMeds that prior authorization for Emgality 120MG /ML auto-injectors (migraine)  is required/requested.   Insurance verification completed.   The patient is insured through [facility] .   Per test claim: PA required; PA submitted to [facility] via CoverMyMeds Key/confirmation #/EOC [id number] Status is pending

## 2022-11-23 ENCOUNTER — Other Ambulatory Visit (HOSPITAL_COMMUNITY): Payer: Self-pay

## 2022-11-23 NOTE — Telephone Encounter (Signed)
Pharmacy Patient Advocate Encounter  Received notification from [facility] that Prior Authorization for Emgality 120MG /ML auto-injectors (migraine) has been APPROVED from 11/22/2022 to 11/22/2023   PA #/Case ID/Reference #: [id number]

## 2022-12-03 ENCOUNTER — Encounter: Payer: Self-pay | Admitting: Endocrinology

## 2022-12-03 ENCOUNTER — Ambulatory Visit: Payer: 59 | Admitting: Endocrinology

## 2022-12-03 VITALS — BP 142/80 | HR 65 | Resp 20 | Ht 68.0 in | Wt 205.4 lb

## 2022-12-04 ENCOUNTER — Other Ambulatory Visit: Payer: Self-pay | Admitting: Internal Medicine

## 2022-12-04 ENCOUNTER — Other Ambulatory Visit: Payer: Self-pay

## 2022-12-04 ENCOUNTER — Ambulatory Visit: Payer: 59 | Admitting: Gastroenterology

## 2022-12-10 ENCOUNTER — Other Ambulatory Visit: Payer: Self-pay | Admitting: *Deleted

## 2022-12-10 NOTE — Progress Notes (Signed)
Cancelled appointment

## 2022-12-10 NOTE — Telephone Encounter (Signed)
Last seen 09/27/22 Follow up scheduled on 01/10/23

## 2022-12-16 ENCOUNTER — Encounter: Payer: Self-pay | Admitting: Family Medicine

## 2022-12-17 ENCOUNTER — Other Ambulatory Visit: Payer: Self-pay

## 2022-12-17 MED ORDER — GABAPENTIN 300 MG PO CAPS: 300.0000 mg | ORAL_CAPSULE | Freq: Every day | ORAL | 0 refills | Status: DC

## 2023-01-07 ENCOUNTER — Encounter: Payer: Self-pay | Admitting: Internal Medicine

## 2023-01-07 ENCOUNTER — Encounter: Payer: Self-pay | Admitting: Family Medicine

## 2023-01-07 ENCOUNTER — Ambulatory Visit: Payer: 59 | Admitting: Family Medicine

## 2023-01-07 VITALS — BP 132/84 | HR 65 | Temp 97.7°F | Ht 68.0 in | Wt 202.4 lb

## 2023-01-07 DIAGNOSIS — I10 Essential (primary) hypertension: Secondary | ICD-10-CM

## 2023-01-07 DIAGNOSIS — Z8673 Personal history of transient ischemic attack (TIA), and cerebral infarction without residual deficits: Secondary | ICD-10-CM

## 2023-01-07 DIAGNOSIS — E1159 Type 2 diabetes mellitus with other circulatory complications: Secondary | ICD-10-CM

## 2023-01-07 DIAGNOSIS — H6591 Unspecified nonsuppurative otitis media, right ear: Secondary | ICD-10-CM

## 2023-01-07 DIAGNOSIS — R42 Dizziness and giddiness: Secondary | ICD-10-CM | POA: Diagnosis not present

## 2023-01-07 DIAGNOSIS — Z7984 Long term (current) use of oral hypoglycemic drugs: Secondary | ICD-10-CM

## 2023-01-07 LAB — COMPREHENSIVE METABOLIC PANEL
ALT: 17 U/L (ref 0–53)
AST: 20 U/L (ref 0–37)
Albumin: 4.3 g/dL (ref 3.5–5.2)
Alkaline Phosphatase: 50 U/L (ref 39–117)
BUN: 19 mg/dL (ref 6–23)
CO2: 32 meq/L (ref 19–32)
Calcium: 9.7 mg/dL (ref 8.4–10.5)
Chloride: 102 meq/L (ref 96–112)
Creatinine, Ser: 1.39 mg/dL (ref 0.40–1.50)
GFR: 55.6 mL/min — ABNORMAL LOW (ref >60.00)
Glucose, Bld: 64 mg/dL — ABNORMAL LOW (ref 70–99)
Potassium: 4.1 meq/L (ref 3.5–5.1)
Sodium: 141 meq/L (ref 135–145)
Total Bilirubin: 0.3 mg/dL (ref 0.2–1.2)
Total Protein: 7.9 g/dL (ref 6.0–8.3)

## 2023-01-07 LAB — CBC WITH DIFFERENTIAL/PLATELET
Basophils Absolute: 0 10*3/uL (ref 0.0–0.1)
Basophils Relative: 0.9 % (ref 0.0–3.0)
Eosinophils Absolute: 0.1 10*3/uL (ref 0.0–0.7)
Eosinophils Relative: 2.5 % (ref 0.0–5.0)
HCT: 42.1 % (ref 39.0–52.0)
Hemoglobin: 13.1 g/dL (ref 13.0–17.0)
Lymphocytes Relative: 32.6 % (ref 12.0–46.0)
Lymphs Abs: 1.5 10*3/uL (ref 0.7–4.0)
MCHC: 31.2 g/dL (ref 30.0–36.0)
MCV: 81.8 fL (ref 78.0–100.0)
Monocytes Absolute: 0.6 10*3/uL (ref 0.1–1.0)
Monocytes Relative: 12.3 % — ABNORMAL HIGH (ref 3.0–12.0)
Neutro Abs: 2.5 10*3/uL (ref 1.4–7.7)
Neutrophils Relative %: 51.7 % (ref 43.0–77.0)
Platelets: 308 10*3/uL (ref 150.0–400.0)
RBC: 5.14 Mil/uL (ref 4.22–5.81)
RDW: 17.1 % — ABNORMAL HIGH (ref 11.5–15.5)
WBC: 4.7 10*3/uL (ref 4.0–10.5)

## 2023-01-07 LAB — D-DIMER, QUANTITATIVE: D-Dimer, Quant: 0.24 ug{FEU}/mL (ref <0.50)

## 2023-01-07 MED ORDER — FLUTICASONE PROPIONATE 50 MCG/ACT NA SUSP
2.0000 | Freq: Every day | NASAL | 2 refills | Status: AC
Start: 2023-01-07 — End: 2023-12-19

## 2023-01-07 NOTE — Progress Notes (Signed)
Acute Office Visit  Subjective:     Patient ID: Jason Ortega, male    DOB: 1965/09/14, 57 y.o.   MRN: [id number]  Chief Complaint  Patient presents with   Dizziness    Started this morning, happened right after he woke up, stated;quote " it felt like he been drinking" Within the past 6-8 month, this is the second occurrence     Dizziness   Patient is in today for evaluation of dizziness, mild headache. Reports that it happened right after he woke up this morning and lasted just a few minutes. Reports that he felt like the room was spinning.  Reports that he has also had a runny nose for the last couple of weeks.  Reports that this is the second time in about 8 months that this has happened. Reports that he normally resides towards prior in the morning, and states that this morning he could not remember half of it.  After a few minutes, symptoms completely resolved. Home blood pressure this morning at 05:22am was 125/74, heart rate was 80.  States he did not check his blood sugar, states this has been stable. He has history of TIA, he was concerned that he may have had another one this morning. Denies any blurred vision, vision loss, depth perception changes, numbness, tingling, palpitations, tinnitus, nausea, vomiting, diarrhea, rash, fever, other symptoms. Medical history as outlined below.   Review of Systems  Neurological:  Positive for dizziness.   Per HPI      Objective:    BP 132/84   Pulse 65   Temp 97.7 F (36.5 C) (Temporal)   Ht 5\' 8"  (1.727 m)   Wt 202 lb 7 oz (91.8 kg)   SpO2 100%   BMI 30.78 kg/m    Physical Exam Vitals and nursing note reviewed.  Constitutional:      Appearance: Normal appearance. He is obese.  HENT:     Head: Normocephalic and atraumatic.     Right Ear: Ear canal normal. A middle ear effusion is present. Tympanic membrane is not erythematous or bulging.     Left Ear: Ear canal normal.  No middle ear effusion. Tympanic membrane  is not erythematous or bulging.     Nose: Rhinorrhea present.     Mouth/Throat:     Mouth: Mucous membranes are moist.     Pharynx: Oropharynx is clear. No oropharyngeal exudate or posterior oropharyngeal erythema.  Eyes:     Extraocular Movements: Extraocular movements intact.  Cardiovascular:     Rate and Rhythm: Normal rate and regular rhythm.     Pulses: Normal pulses.     Heart sounds: Normal heart sounds.  Pulmonary:     Effort: Pulmonary effort is normal.     Breath sounds: Normal breath sounds.  Musculoskeletal:        General: Normal range of motion.     Cervical back: Normal range of motion.     Right lower leg: No edema.     Left lower leg: No edema.  Lymphadenopathy:     Cervical: No cervical adenopathy.  Skin:    General: Skin is warm and dry.  Neurological:     Mental Status: He is alert and oriented to person, place, and time.     Motor: Motor function is intact.     Coordination: Coordination is intact.     Gait: Gait is intact.     No results found for any visits on 01/07/23.  Assessment & Plan:  1. Dizziness  - D-dimer, quantitative  - Comprehensive metabolic panel - CBC with Differential/Platelet  2. Hypertension, uncontrolled  - D-dimer, quantitative - Comprehensive metabolic panel - CBC with Differential/Platelet  3. Type 2 diabetes mellitus with other circulatory complication, without long-term current use of insulin (HCC)  - D-dimer, quantitative - Comprehensive metabolic panel - CBC with Differential/Platelet  4. Fluid level behind tympanic membrane of right ear  - fluticasone (FLONASE) 50 MCG/ACT nasal spray; Place 2 sprays into both nostrils daily for 14 days.  Dispense: 9.9 mL; Refill: 2 - Education attached and provided for vertigo, possibility given middle ear effusion  5. History of TIA (transient ischemic attack)  - D-dimer, quantitative - Comprehensive metabolic panel - CBC with Differential/Platelet - If you are  concerned about another possible TIA, next time I would recommend going straight to the ER for further evaluation as they will need to do imaging as soon as possible - We are checking labs today, will be in contact with any results that require further attention - Feel that given his history, we really need to eval d dimer, declines imaging at this time. Lower suspicion of acute bleed given symptom resolution    Meds ordered this encounter  Medications   fluticasone (FLONASE) 50 MCG/ACT nasal spray    Sig: Place 2 sprays into both nostrils daily for 14 days.    Dispense:  9.9 mL    Refill:  2    Return if symptoms worsen or fail to improve.  Moshe Cipro, FNP

## 2023-01-07 NOTE — Patient Instructions (Addendum)
We are checking labs today, will be in contact with any results that require further attention

## 2023-01-09 ENCOUNTER — Telehealth: Payer: Self-pay

## 2023-01-09 NOTE — Patient Instructions (Signed)
Please continue using your CPAP regularly. While your insurance requires that you use CPAP at least 4 hours each night on 70% of the nights, I recommend, that you not skip any nights and use it throughout the night if you can. Getting used to CPAP and staying with the treatment long term does take time and patience and discipline. Untreated obstructive sleep apnea when it is moderate to severe can have an adverse impact on cardiovascular health and raise her risk for heart disease, arrhythmias, hypertension, congestive heart failure, stroke and diabetes. Untreated obstructive sleep apnea causes sleep disruption, nonrestorative sleep, and sleep deprivation. This can have an impact on your day to day functioning and cause daytime sleepiness and impairment of cognitive function, memory loss, mood disturbance, and problems focussing. Using CPAP regularly can improve these symptoms.  Continue Emgality, rizatriptan and Nurtec as prescribed.   Follow up in 31-90 days from set up of new CPAP machine

## 2023-01-09 NOTE — Progress Notes (Unsigned)
PATIENT: Jason Ortega DOB: January 11, 1966  REASON FOR VISIT: follow up HISTORY FROM: patient  Virtual Visit via Telephone Note  I connected with Jason Ortega on 01/10/23 at  9:00 AM EST by telephone and verified that I am speaking with the correct person using two identifiers.   I discussed the limitations, risks, security and privacy concerns of performing an evaluation and management service by telephone and the availability of in person appointments. I also discussed with the patient that there may be a patient responsible charge related to this service. The patient expressed understanding and agreed to proceed.   History of Present Illness:  01/10/23 ALL:  Jason Ortega returns for follow up for migraines and OSA recently started on a new CPAP machine. Jason Ortega was seen in consult with Dr Frances Furbish 09/2022. HST 10/10/2022 showed moderate obstructive sleep apnea with a total AHI of 18.2/hour and O2 nadir of 87%. AutoPAP ordered. Jason Ortega tells me that Jason Ortega has not received the new machine. Jason Ortega has supplies for new machine but not actual unit. Jason Ortega has not used his old machine much at all.   Jason Ortega continues Emgality, rizatriptan and or Nurtec for migraine control. Migraines have been worse over the past month. Jason Ortega estimates about 8 migraines a month. Rizatriptan usually helps. Jason Ortega reports not filling Nurtec recently as [facility] told him Jason Ortega needed to call us first. Refills sent for 1 year 08/2022.    09/27/2022 SA: Dear Jason Ortega and Jason Ortega,   I saw your patient, Jason Ortega, upon your kind request in my sleep clinic today for initial consultation of his sleep disorder, in particular, evaluation of his prior diagnosis of obstructive sleep apnea.  The patient is unaccompanied today.  As you know, Jason Ortega is a 57 year old male with an underlying medical history of migraine headaches, allergies, anemia, arthritis, depression, diabetes, reflux disease, hypertension, hyperlipidemia, and mild obesity, who reports using PAP for the past 10  years.  Jason Ortega admits that Jason Ortega is not consistent with the machine as Jason Ortega reports working long hours and often falls asleep without it.  Jason Ortega had sleep testing through [facility] over 10 years ago and has not seen a sleep specialist or provider in several years.  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  Bedtime is generally around 11 and rise time around 5.  Over the course of time Jason Ortega has lost quite a bit of weight, since his original sleep apnea diagnosis probably in the realm of 40 pounds.  Jason Ortega has an Epworth sleepiness score of 19 out of 24, fatigue severity score is 43 out of 63.  Jason Ortega is not aware of any family history of sleep apnea.  Jason Ortega drinks caffeine in the form of tea and soda, up to 2 servings per Ortega, occasional or rare alcohol, Jason Ortega is a non-smoker.  Jason Ortega lives alone, Jason Ortega has no pets at the house.  Jason Ortega denies nightly nocturia but has occasional morning headaches.  I reviewed your office note from 08/30/2022.   Jason Ortega would like to get a new CPAP or AutoPap machine.  Jason Ortega did not bring his machine today.  Prior test results are not available for my review today.  A compliance download from his previous Pap machine is not available for my review today.  08/30/22 ALL: Jason Ortega returns for an acute visit for intractable migraine over the past three days. Jason Ortega has taken Nurtec and rizatriptan daily and  headache gets better but returns the next morning. Jason Ortega feels heat and weather are most likely contributors. Jason Ortega denies any new or unusual symptoms with headaches. Steroid tapers work best for intractable headaches. Toradol was not helpful in the past. CBS usually 110-120. Last A1C 6.6. BP has been normal.   08/13/2022 ALL: Jason Ortega returns for follow up for migraines. Jason Ortega continues Emgality, propranolol 20mg  every Ortega and Nurtec/rizatriptan as needed. Also taking verapamil per PCP for BP management. Jason Ortega was doing well until recently. Jason Ortega reports running out  of Emgality due to not thinking Jason Ortega had refills. Med called into [facility] 12/06/2021 for 1 year supply. Jason Ortega has not called [facility] to check. Jason Ortega reports having 2-3 headache days a month on Emgality. Now having 4-5 per month. Nurtec and or rizatriptan work well. BP has been well managed. Jason Ortega does endorse more stress, recently. Jason Ortega drinks at least 64 unces of water daily. Jason Ortega did not follow up with sleep med. Jason Ortega is not using CPAP consistently. Jason Ortega requests a new referral to sleep med, today. Current CPAP machine is greater than 60 years old.   12/06/2021 ALL (Mychart): Jason Ortega returns for follow up for migraines. Jason Ortega continues Emgality monthly, propranolol 20mg  at bedtime and rizatriptan or Nurtec as needed. Jason Ortega reports having 2-3 migraine days a month. Nurtec works very well. Jason Ortega rarely uses rizatriptan. Jason Ortega reports BP has been elevated which contributes to more headache days. Jason Ortega has had more stress, recently. Jason Ortega uses CPAP sporadically, usually 3-4 times a week. Jason Ortega was followed by Jason Ortega who has retired. Jason Ortega called to schedule follow up but reports not hearing back. I referred him to our sleep providers last year. Multiple attempts were made to schedule appt but unable to connect with him.   01/03/2021 ALL: Jason Ortega returns for follow up for migraines. We continued Amovig and rizatriptan at last visit and added propranolol 20mg  BID at last visit 04/2020. Jason Ortega reports that Jason Ortega was unable to tolerate side effects of sleepiness and stopped this medication. Amovig was switched to [employer] due to insurance preference. Headaches are stable. Jason Ortega has about 2 severe migraines a month that requires Jason Ortega call out of work. Jason Ortega has 2-10 headache days a month. Rizatriptan does help. Jason Ortega has not been able to follow up with CPAP provider. Jason Ortega reports using CPAP nightly. Jason Ortega would like to transfer care to our office. Last sleep evaluation about 5 years ago.   04/20/20 ALL: Jason Ortega returns for follow up for migraines. Jason Ortega continues Amovig and rizatriptan. Also  taking verapamil 180mg  daily and gabapentin 600mg  at night PRN prescribed by his PCP for HTN and sleep. Jason Ortega continues to have a tension style, bifrontal, dull headache everyday. Jason Ortega has had 2-3 migraines since January. Rizatriptan is working well for abortive therapy. Jason Ortega admits that BP has been a little higher than normal. Usually 130/80's at home but has been 150's/80's. Jason Ortega is on multiple hypertensive agents.  Jason Ortega is using CPAP nightly. Jason Ortega has not been seen by sleep provider. Jason Ortega called to schedule appt for January that was cancelled by the provider.  01/11/2020 ALL:  Jason Ortega is a 57 y.o. male here today for follow up for migraine headaches.  Jason Ortega continues Aimovig monthly.  Jason Ortega has continued to have regular headaches.  Jason Ortega reports to bad migraines over the past 8 months.  Jason Ortega has used rizatriptan and sumatriptan regularly, usually taking a full prescription of each medication every month.  Jason Ortega reports that rizatriptan seems to be a little bit more effective  than sumatriptan.  Jason Ortega reports that Jason Ortega is using CPAP nightly.  Jason Ortega has not been seen by his sleep provider since March 2019.  Jason Ortega states that Jason Ortega has continued to receive supplies through DME.  Jason Ortega continues follow-up with PCP regularly.   Observations/Objective:  Generalized: Well developed, in no acute distress  Mentation: Alert oriented to time, place, history taking. Follows all commands speech and language fluent   Assessment and Plan:  57 y.o. year old male  has a past medical history of Allergy, Anemia, Arthritis, Cataract, Depression, Diabetes mellitus, Frequent headaches, GERD (gastroesophageal reflux disease), Glaucoma, Hypercholesteremia, Hypertension, Migraines, and Sleep apnea. here with    ICD-10-CM   1. OSA on CPAP  G47.33     2. Chronic migraine w/o aura w/o status migrainosus, not intractable  G43.709       Jason Ortega reports worsening of headaches for the past month Jason Ortega has about 8 migraines per month. Rizatriptan works well. Jason Ortega has not  used Nurtec much, recently. Jason Ortega will continue Emgality monthly and propranolol 20mg  QHS for prevention. Continue Nurtec and rizatriptan for abortive therapy. Jason Ortega was encouraged to call Jason Ortega to inquire about new CPAP set up. Healthy lifestyle habits reviewed. Jason Ortega will follow up 31-90 days following set up of new machine.  Jason Ortega verbalizes understanding and agreement with this plan.   No orders of the defined types were placed in this encounter.    No orders of the defined types were placed in this encounter.     Follow Up Instructions:  I discussed the assessment and treatment plan with the patient. The patient was provided an opportunity to ask questions and all were answered. The patient agreed with the plan and demonstrated an understanding of the instructions.   The patient was advised to call back or seek an in-person evaluation if the symptoms worsen or if the condition fails to improve as anticipated.  I provided 15 minutes of non-face-to-face time during this encounter. Mychart visit converted to televisit due to technical concerns. Patient is located at his place of employment, provider is in the office.    [name], NP

## 2023-01-09 NOTE — Telephone Encounter (Signed)
Called pt and he stated he hasn't received his CPAP Machine. Pt states that he has been calling his DME company [employer] and still haven't received his CPAP Machine through them. Pt states that DME said that they didn't get an order for his CPAP.

## 2023-01-10 ENCOUNTER — Telehealth (INDEPENDENT_AMBULATORY_CARE_PROVIDER_SITE_OTHER): Payer: 59 | Admitting: Family Medicine

## 2023-01-10 ENCOUNTER — Encounter: Payer: Self-pay | Admitting: Family Medicine

## 2023-01-10 DIAGNOSIS — G43709 Chronic migraine without aura, not intractable, without status migrainosus: Secondary | ICD-10-CM | POA: Diagnosis not present

## 2023-01-10 DIAGNOSIS — G4733 Obstructive sleep apnea (adult) (pediatric): Secondary | ICD-10-CM | POA: Diagnosis not present

## 2023-01-11 ENCOUNTER — Other Ambulatory Visit (HOSPITAL_COMMUNITY): Payer: Self-pay

## 2023-01-14 ENCOUNTER — Encounter: Payer: Self-pay | Admitting: Endocrinology

## 2023-01-14 ENCOUNTER — Ambulatory Visit (INDEPENDENT_AMBULATORY_CARE_PROVIDER_SITE_OTHER): Payer: 59 | Admitting: Endocrinology

## 2023-01-14 VITALS — BP 108/60 | HR 70 | Resp 20 | Ht 68.0 in | Wt 209.2 lb

## 2023-01-14 DIAGNOSIS — E119 Type 2 diabetes mellitus without complications: Secondary | ICD-10-CM | POA: Diagnosis not present

## 2023-01-14 DIAGNOSIS — E785 Hyperlipidemia, unspecified: Secondary | ICD-10-CM

## 2023-01-14 DIAGNOSIS — Z7984 Long term (current) use of oral hypoglycemic drugs: Secondary | ICD-10-CM

## 2023-01-14 DIAGNOSIS — E1165 Type 2 diabetes mellitus with hyperglycemia: Secondary | ICD-10-CM

## 2023-01-14 DIAGNOSIS — Z7985 Long-term (current) use of injectable non-insulin antidiabetic drugs: Secondary | ICD-10-CM

## 2023-01-14 LAB — POCT GLYCOSYLATED HEMOGLOBIN (HGB A1C): Hemoglobin A1C: 6.3 % — AB (ref 4.0–5.6)

## 2023-01-14 MED ORDER — OZEMPIC (2 MG/DOSE) 8 MG/3ML ~~LOC~~ SOPN: 2.0000 mg | PEN_INJECTOR | SUBCUTANEOUS | 3 refills | Status: AC

## 2023-01-14 MED ORDER — ONETOUCH VERIO W/DEVICE KIT: 1.0000 | PACK | Freq: Two times a day (BID) | 0 refills | Status: AC

## 2023-01-14 MED ORDER — METFORMIN HCL 1000 MG PO TABS: 1000.0000 mg | ORAL_TABLET | Freq: Two times a day (BID) | ORAL | 3 refills | Status: AC

## 2023-01-14 MED ORDER — DAPAGLIFLOZIN PROPANEDIOL 10 MG PO TABS: 10.0000 mg | ORAL_TABLET | Freq: Every day | ORAL | 3 refills | Status: DC

## 2023-01-14 MED ORDER — ONETOUCH VERIO VI STRP: 1.0000 | ORAL_STRIP | Freq: Two times a day (BID) | 3 refills | Status: AC

## 2023-01-14 NOTE — Progress Notes (Signed)
Outpatient Endocrinology Note Jason Mir Fullilove, MD  01/14/23  Patient's Name: Jason Ortega    DOB: 1965/10/15    MRN: [id number]                                                    REASON OF VISIT:  Follow up for type 2 diabetes mellitus  PCP: Corwin Levins, MD  HISTORY OF PRESENT ILLNESS:   Jason Ortega is a 57 y.o. old male with past medical history listed below, is here for follow up for type 2 diabetes mellitus.  Patient was previously seen by Dr. Lucianne Muss and was last time seen in April 2024.  Pertinent Diabetes History: Patient was diagnosed with type 2 diabetes mellitus in 1999.  He has controlled type 2 diabetes mellitus.  Chronic Diabetes Complications : Retinopathy: no. Last ophthalmology exam was done on annually, following with ophthalmology regularly.  Nephropathy: no, on ACE/ARB / losartan, farxiga Peripheral neuropathy: no Coronary artery disease: no Stroke: no  Relevant comorbidities and cardiovascular risk factors: Obesity: yes Body mass index is 31.81 kg/m.  Hypertension: Yes  Hyperlipidemia : Yes, on statin   Current / Home Diabetic regimen includes:  Non-insulin hypoglycemic drugs: Metformin 1000 mg 2 times a day, Ozempic 2 mg weekly, Farxiga 10 mg daily   Prior diabetic medications: Ozempic was started in 2020.  He had to use insulin in 2020 for a few months.  He had used Amaryl, Byetta and Prandin in the past.  Glycemic data:   He has not been checking blood sugar at home.  Hypoglycemia: Patient has ? hypoglycemic episodes. Patient has hypoglycemia awareness.  Factors modifying glucose control: 1.  Diabetic diet assessment: 3 meals a day.  2.  Staying active or exercising: Walks about 10 to 12 miles per day, he works for [employer].  3.  Medication compliance: compliant all of the time.  Interval history  Recent labs reviewed he had blood sugar of 64 on December 2.  He recalls having lightheadedness symptoms on that day.  He usually walks about 10 to 12  miles /day for the work of [employer].  He has not been checking blood sugar at home.  He does not have glucometer however he has test strips.  Denies numbness and ting of the feet.  No vision problem.  No other complaints today.  He has been tolerating Ozempic well.  REVIEW OF SYSTEMS As per history of present illness.   PAST MEDICAL HISTORY: Past Medical History:  Diagnosis Date   Allergy    Anemia    Arthritis    Cataract    bilateral cataracts removed   Depression    Diabetes mellitus    Frequent headaches    GERD (gastroesophageal reflux disease)    Glaucoma    Hypercholesteremia    Hypertension    Migraines    Sleep apnea    wears c-PAP    PAST SURGICAL HISTORY: Past Surgical History:  Procedure Laterality Date   bilateral cataracts removed     CARDIAC CATHETERIZATION  12/2011   COLONOSCOPY     LEFT HEART CATHETERIZATION WITH CORONARY ANGIOGRAM N/A 11/12/2011   Procedure: LEFT HEART CATHETERIZATION WITH CORONARY ANGIOGRAM;  Surgeon: Pamella Pert, MD;  Location: [facility] CATH LAB;  Service: Cardiovascular;  Laterality: N/A;   SHOULDER SURGERY      ALLERGIES: Allergies  Allergen Reactions   Pollen Extract Other (See Comments)    Stuffy nose    FAMILY HISTORY:  Family History  Problem Relation Age of Onset   Arthritis Mother    Stroke Mother    Hypertension Father    Diabetes Father    Cancer Father        Multiple Myleoma   Diabetes Maternal Grandfather    Colon cancer Neg Hx    Esophageal cancer Neg Hx    Rectal cancer Neg Hx    Stomach cancer Neg Hx    Sleep apnea Neg Hx     SOCIAL HISTORY: Social History   Socioeconomic History   Marital status: Single    Spouse name: Not on file   Number of children: 2   Years of education: 13   Highest education level: Not on file  Occupational History   Occupation: Nature conservation officer  Tobacco Use   Smoking status: Never   Smokeless tobacco: Never  Vaping Use   Vaping status: Never Used  Substance and Sexual Activity    Alcohol use: Yes    Comment: occasional   Drug use: No   Sexual activity: Yes    Birth control/protection: None  Other Topics Concern   Not on file  Social History Narrative   Fun/Hobby: Psychologist, occupational football   Social Determinants of Health   Financial Resource Strain: Not on file  Food Insecurity: Not on file  Transportation Needs: Not on file  Physical Activity: Not on file  Stress: Not on file  Social Connections: Not on file    MEDICATIONS:  Current Outpatient Medications  Medication Sig Dispense Refill   aspirin EC 325 MG tablet Take 1 tablet (325 mg total) by mouth daily. 30 tablet 99   atorvastatin (LIPITOR) 40 MG tablet TAKE 1 TABLET BY MOUTH EVERY DAY 90 tablet 3   Blood Pressure Monitoring (BLOOD PRESSURE DIGITAL SOLN) KIT      chlorproMAZINE (THORAZINE) 25 MG tablet Take 1 tablet (25 mg total) by mouth 3 (three) times daily as needed. 30 tablet 0   diclofenac sodium (VOLTAREN) 1 % GEL Apply 2 g topically 4 (four) times daily. 200 g 5   dicyclomine (BENTYL) 10 MG capsule TAKE 1 CAPSULE (10 MG TOTAL) BY MOUTH 4 (FOUR) TIMES DAILY - BEFORE MEALS AND AT BEDTIME. 90 capsule 3   fluticasone (FLONASE) 50 MCG/ACT nasal spray Place 2 sprays into both nostrils daily for 14 days. 9.9 mL 2   gabapentin (NEURONTIN) 300 MG capsule Take 1 capsule (300 mg total) by mouth at bedtime. 90 capsule 0   Galcanezumab-gnlm (EMGALITY) 120 MG/ML SOAJ Inject 120 mg into the skin every 30 (thirty) days. 3 mL 3   ibuprofen (ADVIL,MOTRIN) 600 MG tablet TAKE 1 TABLET(600 MG) BY MOUTH EVERY 8 HOURS AS NEEDED 30 tablet 0   losartan-hydrochlorothiazide (HYZAAR) 100-25 MG tablet TAKE 1 TABLET BY MOUTH EVERY DAY 90 tablet 3   meloxicam (MOBIC) 15 MG tablet TAKE 1 TABLET BY MOUTH DAILY. 30 tablet 4   methocarbamol (ROBAXIN) 500 MG tablet TAKE 1 TABLET BY MOUTH TWICE DAILY 20 tablet 0   methylPREDNISolone (MEDROL DOSEPAK) 4 MG TBPK tablet Taper pack as directed. 1 each 0   OneTouch Delica Lancets 30G MISC 1  each by Does not apply route 2 (two) times daily. E11.9     pantoprazole (PROTONIX) 40 MG tablet Take 1 tablet (40 mg total) by mouth daily. 90 tablet 3   pantoprazole (PROTONIX) 40 MG tablet Take 1 tablet (  40 mg total) by mouth 2 (two) times daily. 180 tablet 3   PENNSAID 2 % SOLN SMARTSIG:2 Pump Topical Twice Daily     propranolol (INDERAL) 20 MG tablet Take 1 tablet (20 mg total) by mouth at bedtime. 90 tablet 3   Rimegepant Sulfate (NURTEC) 75 MG TBDP Take 1 tablet (75 mg total) by mouth as needed (Take 1 tablet at onset of migraine ( Do not exceed more than 1 tablet in 24 hours)). 8 tablet 11   rizatriptan (MAXALT-MLT) 10 MG disintegrating tablet TAKE 1 TABLET BY MOUTH AS NEEDED FOR MIGRAINE. MAY REPEAT IN 2 HOURS IF NEEDED 9 tablet 11   sildenafil (REVATIO) 20 MG tablet Take 20 mg by mouth. 3-5 tabs prn     sucralfate (CARAFATE) 1 g tablet TAKE 1 TABLET (1 G TOTAL) BY MOUTH 4 TIMES A DAY WITH MEALS AND AT BEDTIME 120 tablet 0   terbinafine (LAMISIL) 250 MG tablet Take 1 tablet (250 mg total) by mouth daily. 90 tablet 0   timolol (TIMOPTIC-XR) 0.5 % ophthalmic gel-forming Place 1 drop into both eyes daily.      verapamil (CALAN-SR) 240 MG CR tablet TAKE 1 TABLET BY MOUTH AT BEDTIME. 90 tablet 3   Blood Glucose Monitoring Suppl (ONETOUCH VERIO) w/Device KIT 1 Device by Does not apply route 2 (two) times daily. E11.9 1 kit 0   dapagliflozin propanediol (FARXIGA) 10 MG TABS tablet Take 1 tablet (10 mg total) by mouth daily. 90 tablet 3   glucose blood (ONETOUCH VERIO) test strip 1 each by Other route 2 (two) times daily. E11.9 100 each 3   metFORMIN (GLUCOPHAGE) 1000 MG tablet Take 1 tablet (1,000 mg total) by mouth 2 (two) times daily with a meal. 180 tablet 3   Semaglutide, 2 MG/DOSE, (OZEMPIC, 2 MG/DOSE,) 8 MG/3ML SOPN Inject 2 mg into the skin once a week. 9 mL 3   No current facility-administered medications for this visit.    PHYSICAL EXAM: Vitals:   01/14/23 0834  BP: 108/60  Pulse:  70  Resp: 20  SpO2: 98%  Weight: 209 lb 3.2 oz (94.9 kg)  Height: 5\' 8"  (1.727 m)   Body mass index is 31.81 kg/m.  Wt Readings from Last 3 Encounters:  01/14/23 209 lb 3.2 oz (94.9 kg)  01/07/23 202 lb 7 oz (91.8 kg)  12/03/22 205 lb 6.4 oz (93.2 kg)    General: Well developed, well nourished male in no apparent distress.  HEENT: AT/[place], no external lesions.  Eyes: Conjunctiva clear and no icterus. Neck: Neck supple  Lungs: Respirations not labored Neurologic: Alert, oriented, normal speech Extremities / Skin: Dry.  Psychiatric: Does not appear depressed or anxious  Diabetic Foot Exam - Simple   No data filed    LABS Reviewed Lab Results  Component Value Date   HGBA1C 6.3 (A) 01/14/2023   HGBA1C 6.7 (H) 10/23/2022   HGBA1C 6.6 (H) 03/26/2022   No results found for: "FRUCTOSAMINE" Lab Results  Component Value Date   CHOL 133 10/23/2022   HDL 59.40 10/23/2022   LDLCALC 57 10/23/2022   TRIG 84.0 10/23/2022   CHOLHDL 2 10/23/2022   Lab Results  Component Value Date   MICRALBCREAT 0.7 10/23/2022   MICRALBCREAT 1.2 03/26/2022   Lab Results  Component Value Date   CREATININE 1.39 01/07/2023   Lab Results  Component Value Date   GFR 55.60 (L) 01/07/2023    ASSESSMENT / PLAN  1. Type 2 diabetes mellitus with hyperglycemia, without long-term  current use of insulin (HCC)   2. Type 2 diabetes mellitus without complication, without long-term current use of insulin (HCC)     Diabetes Mellitus type 2, complicated by no known complication. - Diabetic status / severity: Controlled.  Lab Results  Component Value Date   HGBA1C 6.3 (A) 01/14/2023    - Hemoglobin A1c goal : <7%  - Medications: No change.  I) Ozempic 2 mg weekly. II) Farxiga 10 mg daily. III) metformin 1000 mg 2 times a day.  - Home glucose testing: Advised to check blood sugar at least few times a week at different times of the week and also when having symptoms of lightheadedness or weakness.   Glucometer prescribed.  - Discussed/ Gave Hypoglycemia treatment plan.  # Consult : not required at this time.   # Annual urine for microalbuminuria/ creatinine ratio, no microalbuminuria currently, continue ACE/ARB losartan. Last  Lab Results  Component Value Date   MICRALBCREAT 0.7 10/23/2022    # Foot check nightly.  # Annual dilated diabetic eye exams.   - Diet: Make healthy diabetic food choices - Life style / activity / exercise: Discussed.  2. Blood pressure  -  BP Readings from Last 1 Encounters:  01/14/23 108/60    - Control is in target.  - No change in current plans.  3. Lipid status / Hyperlipidemia - Last  Lab Results  Component Value Date   LDLCALC 57 10/23/2022   - Continue atorvastatin 40 mg daily.  Managed by PCP.  Diagnoses and all orders for this visit:  Type 2 diabetes mellitus with hyperglycemia, without long-term current use of insulin (HCC) -     POCT glycosylated hemoglobin (Hb A1C)  Type 2 diabetes mellitus without complication, without long-term current use of insulin (HCC) -     dapagliflozin propanediol (FARXIGA) 10 MG TABS tablet; Take 1 tablet (10 mg total) by mouth daily.  Other orders -     Blood Glucose Monitoring Suppl (ONETOUCH VERIO) w/Device KIT; 1 Device by Does not apply route 2 (two) times daily. E11.9 -     glucose blood (ONETOUCH VERIO) test strip; 1 each by Other route 2 (two) times daily. E11.9 -     metFORMIN (GLUCOPHAGE) 1000 MG tablet; Take 1 tablet (1,000 mg total) by mouth 2 (two) times daily with a meal. -     Semaglutide, 2 MG/DOSE, (OZEMPIC, 2 MG/DOSE,) 8 MG/3ML SOPN; Inject 2 mg into the skin once a week.    DISPOSITION Follow up in clinic in 4  months suggested.   All questions answered and patient verbalized understanding of the plan.  Jason Abbey Veith, MD [facility] Endocrinology [facility] [address] [place], [address] [place], [place] [address] Phone # [phone]  At least part of this note  was generated using voice recognition software. Inadvertent word errors may have occurred, which were not recognized during the proofreading process.

## 2023-01-15 ENCOUNTER — Telehealth: Payer: Self-pay | Admitting: *Deleted

## 2023-01-15 NOTE — Telephone Encounter (Signed)
Amy's message: "He reports not being able to get in touch with [facility] to get new autopap ordered 10/2022"  I sent community message via epic 12/05 and 12/09 with no response asking for update. I called just now and spoke w/ Melissa. She states they pulled order 12/02/22 and just got PA today for CPAP. Camelia Eng will be following up on this and then will call the pt.  I called pt and provided an update. He verbalized understanding and appreciation.

## 2023-01-25 ENCOUNTER — Encounter: Payer: Self-pay | Admitting: Internal Medicine

## 2023-01-27 ENCOUNTER — Other Ambulatory Visit: Payer: Self-pay | Admitting: Internal Medicine

## 2023-01-27 ENCOUNTER — Other Ambulatory Visit: Payer: Self-pay | Admitting: Family Medicine

## 2023-01-27 DIAGNOSIS — I10 Essential (primary) hypertension: Secondary | ICD-10-CM

## 2023-01-28 ENCOUNTER — Other Ambulatory Visit: Payer: Self-pay

## 2023-01-28 ENCOUNTER — Other Ambulatory Visit: Payer: Self-pay | Admitting: Internal Medicine

## 2023-01-28 MED ORDER — MELOXICAM 15 MG PO TABS: 15.0000 mg | ORAL_TABLET | Freq: Every day | ORAL | 0 refills | Status: DC

## 2023-01-28 MED ORDER — GABAPENTIN 300 MG PO CAPS: 300.0000 mg | ORAL_CAPSULE | Freq: Every day | ORAL | 0 refills | Status: DC

## 2023-01-28 NOTE — Telephone Encounter (Signed)
Copied from CRM [id number]. Topic: Clinical - Prescription Issue >> Jan 28, 2023  8:32 AM Isabell A wrote: Reason for CRM: Most Recent Primary Care Visit:  Provider: Moshe Cipro   Department: [facility]   Visit Type: OFFICE VISIT   Date: 01/07/2023    Medication: atorvastatin (LIPITOR) 40 MG tablet    Has the patient contacted their pharmacy? Yes  (Agent: If no, request that the patient contact the pharmacy for the refill. If patient does not wish to contact the pharmacy document the reason why and proceed with request.)  (Agent: If yes, when and what did the pharmacy advise?) Pharmacy states their unable to reach anyone at the office.    Is this the correct pharmacy for this prescription? Yes  If no, delete pharmacy and type the correct one.  This is the patient's preferred pharmacy:  [id number] - [place], [place] - [address] AT [address] OF [facility] [address]  [address]  [place] [address]  Phone: [phone] Fax: [phone]      Has the prescription been filled recently? Yes    Is the patient out of the medication? Yes    Has the patient been seen for an appointment in the last year OR does the patient have an upcoming appointment? Yes    Can we respond through MyChart? Yes    Agent: Please be advised that Rx refills may take up to 3 business days. We ask that you follow-up with your pharmacy.

## 2023-01-28 NOTE — Telephone Encounter (Signed)
Copied from CRM [id number]. Topic: Clinical - Medication Refill >> Jan 28, 2023  8:30 AM Jason Ortega wrote: Most Recent Primary Care Visit:  Provider: Moshe Cipro  Department: [facility]  Visit Type: OFFICE VISIT  Date: 01/07/2023  Medication: ***  Has the patient contacted their pharmacy?  (Agent: If no, request that the patient contact the pharmacy for the refill. If patient does not wish to contact the pharmacy document the reason why and proceed with request.) (Agent: If yes, when and what did the pharmacy advise?)  Is this the correct pharmacy for this prescription?  If no, delete pharmacy and type the correct one.  This is the patient's preferred pharmacy:  [id number] - [place], [place] - [address] AT [address] OF [facility] [address] [place] [address] Phone: [phone] Fax: [phone]  [id number] [place], [place] - [address] AT [facility] [address] [facility] [address] [facility] [address] [place] [address] Phone: [phone] Fax: [phone]   Has the prescription been filled recently?   Is the patient out of the medication?   Has the patient been seen for an appointment in the last year OR does the patient have an upcoming appointment?   Can we respond through MyChart?   Agent: Please be advised that Rx refills may take up to 3 business days. We ask that you follow-up with your pharmacy.

## 2023-02-04 ENCOUNTER — Other Ambulatory Visit: Payer: Self-pay | Admitting: *Deleted

## 2023-02-04 MED ORDER — PROPRANOLOL HCL 20 MG PO TABS: 20.0000 mg | ORAL_TABLET | Freq: Every day | ORAL | 1 refills | Status: DC

## 2023-02-04 NOTE — Telephone Encounter (Signed)
Last seen on 03/01/22 No follow up scheduled

## 2023-02-07 ENCOUNTER — Encounter: Payer: Self-pay | Admitting: Hematology and Oncology

## 2023-02-11 ENCOUNTER — Encounter: Payer: Self-pay | Admitting: Hematology and Oncology

## 2023-02-12 ENCOUNTER — Encounter: Payer: Self-pay | Admitting: Family Medicine

## 2023-02-12 ENCOUNTER — Ambulatory Visit (INDEPENDENT_AMBULATORY_CARE_PROVIDER_SITE_OTHER): Payer: 59 | Admitting: Family Medicine

## 2023-02-12 ENCOUNTER — Encounter: Payer: Self-pay | Admitting: Hematology and Oncology

## 2023-02-12 VITALS — BP 124/60 | HR 71 | Ht 68.0 in | Wt 208.0 lb

## 2023-02-12 DIAGNOSIS — M94261 Chondromalacia, right knee: Secondary | ICD-10-CM

## 2023-02-12 NOTE — Patient Instructions (Addendum)
 Good to see you  Injection in right knee Note given for work  Follow up in 2-3 months

## 2023-02-12 NOTE — Assessment & Plan Note (Signed)
 I believe that this is an exacerbation of a chronic problem with somewhat worsening symptoms.  We discussed continuing with the home exercises, the icing regimen, which activities to do and which ones to avoid, increase activity slowly.  Follow-up with me again in 6 to 8 weeks otherwise.  Worsening pain consider the possibility of viscosupplementation.  Patient has done well with the injection previously well.

## 2023-02-12 NOTE — Progress Notes (Signed)
 [facility] [address] [place] [address] Phone: [phone] Subjective:   Jason Ortega, am serving as a scribe for Dr. Arthea Claudene.  I'm seeing this patient by the request  of:  Norleen Lynwood ORN, MD  CC: Right knee pain follow-up  [name]  Jason Ortega is a 58 y.o. male coming in with complaint of right knee pain, patient has had difficulty with this knee for quite some time.  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 Patient states right knee started bothering him again about 3-4 weeks ago, same area as before but radiates down lateral right leg to the calf. Patient states his hamstring has started to bother him some more. Patient has been trying to do some stretches to help.     Past Medical History:  Diagnosis Date   Allergy    Anemia    Arthritis    Cataract    bilateral cataracts removed   Depression    Diabetes mellitus    Frequent headaches    GERD (gastroesophageal reflux disease)    Glaucoma    Hypercholesteremia    Hypertension    Migraines    Sleep apnea    wears c-PAP   Past Surgical History:  Procedure Laterality Date   bilateral cataracts removed     CARDIAC CATHETERIZATION  12/2011   COLONOSCOPY     LEFT HEART CATHETERIZATION WITH CORONARY ANGIOGRAM N/A 11/12/2011   Procedure: LEFT HEART CATHETERIZATION WITH CORONARY ANGIOGRAM;  Surgeon: Erick JONELLE Bergamo, MD;  Location: [facility] CATH LAB;  Service: Cardiovascular;  Laterality: N/A;   SHOULDER SURGERY     Social History   Socioeconomic History   Marital status: Single    Spouse name: Not on file   Number of children: 2   Years of education: 40   Highest education level: Not on file  Occupational History   Occupation: Nature Conservation Officer  Tobacco Use   Smoking status: Never   Smokeless tobacco: Never  Vaping Use   Vaping status: Never Used   Substance and Sexual Activity   Alcohol use: Yes    Comment: occasional   Drug use: No   Sexual activity: Yes    Birth control/protection: None  Other Topics Concern   Not on file  Social History Narrative   Fun/Hobby: Psychologist, Occupational football   Social Drivers of Corporate Investment Banker Strain: Not on file  Food Insecurity: Not on file  Transportation Needs: Not on file  Physical Activity: Not on file  Stress: Not on file  Social Connections: Not on file   Allergies  Allergen Reactions   Pollen Extract Other (See Comments)    Stuffy nose   Family History  Problem Relation Age of Onset   Arthritis Mother    Stroke Mother    Hypertension Father    Diabetes Father    Cancer Father        Multiple Myleoma   Diabetes Maternal Grandfather    Colon cancer Neg Hx    Esophageal cancer Neg Hx    Rectal cancer Neg Hx    Stomach cancer Neg Hx    Sleep apnea Neg Hx     Current Outpatient Medications (Endocrine & Metabolic):    dapagliflozin  propanediol (FARXIGA ) 10 MG TABS tablet, Take 1 tablet (10 mg total) by mouth daily.   metFORMIN  (GLUCOPHAGE )  1000 MG tablet, Take 1 tablet (1,000 mg total) by mouth 2 (two) times daily with a meal.   methylPREDNISolone  (MEDROL  DOSEPAK) 4 MG TBPK tablet, Taper pack as directed.   Semaglutide , 2 MG/DOSE, (OZEMPIC , 2 MG/DOSE,) 8 MG/3ML SOPN, Inject 2 mg into the skin once a week.  Current Outpatient Medications (Cardiovascular):    atorvastatin  (LIPITOR) 40 MG tablet, TAKE 1 TABLET BY MOUTH EVERY DAY   losartan -hydrochlorothiazide  (HYZAAR) 100-25 MG tablet, TAKE 1 TABLET BY MOUTH EVERY DAY   propranolol  (INDERAL ) 20 MG tablet, Take 1 tablet (20 mg total) by mouth at bedtime.   sildenafil (REVATIO) 20 MG tablet, Take 20 mg by mouth. 3-5 tabs prn   verapamil  (CALAN -SR) 240 MG CR tablet, TAKE 1 TABLET BY MOUTH AT BEDTIME.  Current Outpatient Medications (Respiratory):    fluticasone  (FLONASE ) 50 MCG/ACT nasal spray, Place 2 sprays into both  nostrils daily for 14 days.  Current Outpatient Medications (Analgesics):    aspirin  EC 325 MG tablet, Take 1 tablet (325 mg total) by mouth daily.   Galcanezumab -gnlm (EMGALITY ) 120 MG/ML SOAJ, Inject 120 mg into the skin every 30 (thirty) days.   ibuprofen  (ADVIL ,MOTRIN ) 600 MG tablet, TAKE 1 TABLET(600 MG) BY MOUTH EVERY 8 HOURS AS NEEDED   meloxicam  (MOBIC ) 15 MG tablet, Take 1 tablet (15 mg total) by mouth daily.   Rimegepant Sulfate (NURTEC) 75 MG TBDP, Take 1 tablet (75 mg total) by mouth as needed (Take 1 tablet at onset of migraine ( Do not exceed more than 1 tablet in 24 hours)).   rizatriptan  (MAXALT -MLT) 10 MG disintegrating tablet, TAKE 1 TABLET BY MOUTH AS NEEDED FOR MIGRAINE. MAY REPEAT IN 2 HOURS IF NEEDED   Current Outpatient Medications (Other):    Blood Glucose Monitoring Suppl (ONETOUCH VERIO) w/Device KIT, 1 Device by Does not apply route 2 (two) times daily. E11.9   Blood Pressure Monitoring (BLOOD PRESSURE DIGITAL SOLN) KIT,    chlorproMAZINE  (THORAZINE ) 25 MG tablet, Take 1 tablet (25 mg total) by mouth 3 (three) times daily as needed.   diclofenac  sodium (VOLTAREN ) 1 % GEL, Apply 2 g topically 4 (four) times daily.   dicyclomine  (BENTYL ) 10 MG capsule, TAKE 1 CAPSULE (10 MG TOTAL) BY MOUTH 4 (FOUR) TIMES DAILY - BEFORE MEALS AND AT BEDTIME.   gabapentin  (NEURONTIN ) 300 MG capsule, Take 1 capsule (300 mg total) by mouth at bedtime.   glucose blood (ONETOUCH VERIO) test strip, 1 each by Other route 2 (two) times daily. E11.9   methocarbamol  (ROBAXIN ) 500 MG tablet, TAKE 1 TABLET BY MOUTH TWICE DAILY   OneTouch Delica Lancets 30G MISC, 1 each by Does not apply route 2 (two) times daily. E11.9   pantoprazole  (PROTONIX ) 40 MG tablet, Take 1 tablet (40 mg total) by mouth daily.   pantoprazole  (PROTONIX ) 40 MG tablet, Take 1 tablet (40 mg total) by mouth 2 (two) times daily.   PENNSAID  2 % SOLN, SMARTSIG:2 Pump Topical Twice Daily   sucralfate  (CARAFATE ) 1 g tablet, TAKE 1  TABLET (1 G TOTAL) BY MOUTH 4 TIMES A DAY WITH MEALS AND AT BEDTIME   terbinafine  (LAMISIL ) 250 MG tablet, TAKE 1 TABLET BY MOUTH EVERY DAY   timolol (TIMOPTIC-XR) 0.5 % ophthalmic gel-forming, Place 1 drop into both eyes daily.    Reviewed prior external information including notes and imaging from  primary care provider As well as notes that were available from care everywhere and other healthcare systems.  Past medical history, social, surgical and family history all  reviewed in electronic medical record.  No pertanent information unless stated regarding to the chief complaint.   Review of Systems:  No headache, visual changes, nausea, vomiting, diarrhea, constipation, dizziness, abdominal pain, skin rash, fevers, chills, night sweats, weight loss, swollen lymph nodes, body aches,  chest pain, shortness of breath, mood changes. POSITIVE muscle aches, joint swelling  Objective  Blood pressure 124/60, pulse 71, height 5' 8 (1.727 m), weight 208 lb (94.3 kg), SpO2 98%.   General: No apparent distress alert and oriented x3 mood and affect normal, dressed appropriately.  HEENT: Pupils equal, extraocular movements intact  Respiratory: Patient's speak in full sentences and does not appear short of breath  Cardiovascular: No lower extremity edema, non tender, no erythema  Right knee exam shows some lateral tracking of the patella noted.  Positive patellar grind test noted.  After informed written and verbal consent, patient was seated on exam table. Right knee was prepped with alcohol swab and utilizing anterolateral approach, patient's right knee space was injected with 4:1  marcaine 0.5%: Kenalog 40mg /dL. Patient tolerated the procedure well without immediate complications.    Impression and Recommendations:    The above documentation has been reviewed and is accurate and complete Bryne Lindon M Amillya Chavira, DO

## 2023-02-15 ENCOUNTER — Ambulatory Visit: Payer: 59 | Admitting: Internal Medicine

## 2023-02-15 ENCOUNTER — Encounter: Payer: Self-pay | Admitting: Internal Medicine

## 2023-02-15 VITALS — BP 136/84 | HR 65 | Temp 98.3°F | Ht 68.0 in | Wt 207.0 lb

## 2023-02-15 DIAGNOSIS — E559 Vitamin D deficiency, unspecified: Secondary | ICD-10-CM | POA: Diagnosis not present

## 2023-02-15 DIAGNOSIS — E119 Type 2 diabetes mellitus without complications: Secondary | ICD-10-CM

## 2023-02-15 DIAGNOSIS — R55 Syncope and collapse: Secondary | ICD-10-CM | POA: Diagnosis not present

## 2023-02-15 DIAGNOSIS — I10 Essential (primary) hypertension: Secondary | ICD-10-CM | POA: Diagnosis not present

## 2023-02-15 DIAGNOSIS — Z7985 Long-term (current) use of injectable non-insulin antidiabetic drugs: Secondary | ICD-10-CM

## 2023-02-15 DIAGNOSIS — Z7984 Long term (current) use of oral hypoglycemic drugs: Secondary | ICD-10-CM

## 2023-02-15 MED ORDER — LOSARTAN POTASSIUM 100 MG PO TABS: 100.0000 mg | ORAL_TABLET | Freq: Every day | ORAL | 3 refills | Status: DC

## 2023-02-15 NOTE — Progress Notes (Signed)
 Patient ID: Jason Ortega, male   DOB: 17-May-1965, 58 y.o.   MRN: [id number]        Chief Complaint: follow up syncope x 2, htn, ld, dm, low vit d       HPI:  Jason Ortega is a 58 y.o. male here with c/o syncopal episode jan 5 with pt stating he was getting up to BR to urinate when he was dizzy and fell across the table in the room, no specific injury, not witnessed, no confusion after and no bowel or bladder incontinence, no other prodromal symptoms.  Pt denies chest pain, increased sob or doe, wheezing, orthopnea, PND, increased LE swelling, palpitations, dizziness or syncope.   Pt denies polydipsia, polyuria, or new focal neuro s/s.   Seen at UC that day with ECG and lab with ua, bmp and cbc benign.  Referred to cardiology, has appt feb 7.  This was second episode with prior being dec 2, no injury.   Pt denies fever, wt loss, night sweats, loss of appetite, or other constitutional symptoms  Denies worsening reflux, abd pain, dysphagia, n/v, bowel change or blood.       Wt Readings from Last 3 Encounters:  02/15/23 207 lb (93.9 kg)  02/12/23 208 lb (94.3 kg)  01/14/23 209 lb 3.2 oz (94.9 kg)   BP Readings from Last 3 Encounters:  02/15/23 136/84  02/12/23 124/60  01/14/23 108/60         Past Medical History:  Diagnosis Date   Allergy    Anemia    Arthritis    Cataract    bilateral cataracts removed   Depression    Diabetes mellitus    Frequent headaches    GERD (gastroesophageal reflux disease)    Glaucoma    Hypercholesteremia    Hypertension    Migraines    Sleep apnea    wears c-PAP   Past Surgical History:  Procedure Laterality Date   bilateral cataracts removed     CARDIAC CATHETERIZATION  12/2011   COLONOSCOPY     LEFT HEART CATHETERIZATION WITH CORONARY ANGIOGRAM N/A 11/12/2011   Procedure: LEFT HEART CATHETERIZATION WITH CORONARY ANGIOGRAM;  Surgeon: Erick JONELLE Bergamo, MD;  Location: [facility] CATH LAB;  Service: Cardiovascular;  Laterality: N/A;   SHOULDER SURGERY       reports that he has never smoked. He has never used smokeless tobacco. He reports current alcohol use. He reports that he does not use drugs. family history includes Arthritis in his mother; Cancer in his father; Diabetes in his father and maternal grandfather; Hypertension in his father; Stroke in his mother. Allergies  Allergen Reactions   Pollen Extract Other (See Comments)    Stuffy nose   Current Outpatient Medications on File Prior to Visit  Medication Sig Dispense Refill   aspirin  EC 325 MG tablet Take 1 tablet (325 mg total) by mouth daily. 30 tablet 99   atorvastatin  (LIPITOR) 40 MG tablet TAKE 1 TABLET BY MOUTH EVERY DAY 90 tablet 3   Blood Glucose Monitoring Suppl (ONETOUCH VERIO) w/Device KIT 1 Device by Does not apply route 2 (two) times daily. E11.9 1 kit 0   Blood Pressure Monitoring (BLOOD PRESSURE DIGITAL SOLN) KIT      chlorproMAZINE  (THORAZINE ) 25 MG tablet Take 1 tablet (25 mg total) by mouth 3 (three) times daily as needed. 30 tablet 0   dapagliflozin  propanediol (FARXIGA ) 10 MG TABS tablet Take 1 tablet (10 mg total) by mouth daily. 90 tablet 3   diclofenac   sodium (VOLTAREN ) 1 % GEL Apply 2 g topically 4 (four) times daily. 200 g 5   dicyclomine  (BENTYL ) 10 MG capsule TAKE 1 CAPSULE (10 MG TOTAL) BY MOUTH 4 (FOUR) TIMES DAILY - BEFORE MEALS AND AT BEDTIME. 90 capsule 3   gabapentin  (NEURONTIN ) 300 MG capsule Take 1 capsule (300 mg total) by mouth at bedtime. 30 capsule 0   Galcanezumab -gnlm (EMGALITY ) 120 MG/ML SOAJ Inject 120 mg into the skin every 30 (thirty) days. 3 mL 3   glucose blood (ONETOUCH VERIO) test strip 1 each by Other route 2 (two) times daily. E11.9 100 each 3   ibuprofen  (ADVIL ,MOTRIN ) 600 MG tablet TAKE 1 TABLET(600 MG) BY MOUTH EVERY 8 HOURS AS NEEDED 30 tablet 0   meloxicam  (MOBIC ) 15 MG tablet Take 1 tablet (15 mg total) by mouth daily. 30 tablet 0   metFORMIN  (GLUCOPHAGE ) 1000 MG tablet Take 1 tablet (1,000 mg total) by mouth 2 (two) times daily with  a meal. 180 tablet 3   methocarbamol  (ROBAXIN ) 500 MG tablet TAKE 1 TABLET BY MOUTH TWICE DAILY 20 tablet 0   methylPREDNISolone  (MEDROL  DOSEPAK) 4 MG TBPK tablet Taper pack as directed. 1 each 0   OneTouch Delica Lancets 30G MISC 1 each by Does not apply route 2 (two) times daily. E11.9     pantoprazole  (PROTONIX ) 40 MG tablet Take 1 tablet (40 mg total) by mouth daily. 90 tablet 3   pantoprazole  (PROTONIX ) 40 MG tablet Take 1 tablet (40 mg total) by mouth 2 (two) times daily. 180 tablet 3   PENNSAID  2 % SOLN SMARTSIG:2 Pump Topical Twice Daily     propranolol  (INDERAL ) 20 MG tablet Take 1 tablet (20 mg total) by mouth at bedtime. 90 tablet 1   Rimegepant Sulfate (NURTEC) 75 MG TBDP Take 1 tablet (75 mg total) by mouth as needed (Take 1 tablet at onset of migraine ( Do not exceed more than 1 tablet in 24 hours)). 8 tablet 11   rizatriptan  (MAXALT -MLT) 10 MG disintegrating tablet TAKE 1 TABLET BY MOUTH AS NEEDED FOR MIGRAINE. MAY REPEAT IN 2 HOURS IF NEEDED 9 tablet 11   Semaglutide , 2 MG/DOSE, (OZEMPIC , 2 MG/DOSE,) 8 MG/3ML SOPN Inject 2 mg into the skin once a week. 9 mL 3   sildenafil (REVATIO) 20 MG tablet Take 20 mg by mouth. 3-5 tabs prn     sucralfate  (CARAFATE ) 1 g tablet TAKE 1 TABLET (1 G TOTAL) BY MOUTH 4 TIMES A DAY WITH MEALS AND AT BEDTIME 120 tablet 0   terbinafine  (LAMISIL ) 250 MG tablet TAKE 1 TABLET BY MOUTH EVERY DAY 90 tablet 3   timolol (TIMOPTIC-XR) 0.5 % ophthalmic gel-forming Place 1 drop into both eyes daily.      verapamil  (CALAN -SR) 240 MG CR tablet TAKE 1 TABLET BY MOUTH AT BEDTIME. 90 tablet 3   fluticasone  (FLONASE ) 50 MCG/ACT nasal spray Place 2 sprays into both nostrils daily for 14 days. 9.9 mL 2   No current facility-administered medications on file prior to visit.        ROS:  All others reviewed and negative.  Objective        PE:  BP 136/84 (BP Location: Left Arm, Patient Position: Sitting, Cuff Size: Normal)   Pulse 65   Temp 98.3 F (36.8 C) (Oral)    Ht 5' 8 (1.727 m)   Wt 207 lb (93.9 kg)   SpO2 98%   BMI 31.47 kg/m  Constitutional: Pt appears in NAD               HENT: Head: NCAT.                Right Ear: External ear normal.                 Left Ear: External ear normal.                Eyes: . Pupils are equal, round, and reactive to light. Conjunctivae and EOM are normal               Nose: without d/c or deformity               Neck: Neck supple. Gross normal ROM               Cardiovascular: Normal rate and regular rhythm.                 Pulmonary/Chest: Effort normal and breath sounds without rales or wheezing.                Abd:  Soft, NT, ND, + BS, no organomegaly               Neurological: Pt is alert. At baseline orientation, motor grossly intact               Skin: Skin is warm. No rashes, no other new lesions, LE edema - none               Psychiatric: Pt behavior is normal without agitation   Micro: none  Cardiac tracings I have personally interpreted today:  none  Pertinent Radiological findings (summarize): none   Lab Results  Component Value Date   WBC 4.7 01/07/2023   HGB 13.1 01/07/2023   HCT 42.1 01/07/2023   PLT 308.0 01/07/2023   GLUCOSE 64 (L) 01/07/2023   CHOL 133 10/23/2022   TRIG 84.0 10/23/2022   HDL 59.40 10/23/2022   LDLCALC 57 10/23/2022   ALT 17 01/07/2023   AST 20 01/07/2023   NA 141 01/07/2023   K 4.1 01/07/2023   CL 102 01/07/2023   CREATININE 1.39 01/07/2023   BUN 19 01/07/2023   CO2 32 01/07/2023   TSH 0.74 10/23/2022   PSA 0.68 10/23/2022   HGBA1C 6.3 (A) 01/14/2023   MICROALBUR <0.7 10/23/2022   Assessment/Plan:  Jason Ortega is a 58 y.o. Black or African American [2] male with  has a past medical history of Allergy, Anemia, Arthritis, Cataract, Depression, Diabetes mellitus, Frequent headaches, GERD (gastroesophageal reflux disease), Glaucoma, Hypercholesteremia, Hypertension, Migraines, and Sleep apnea.  Syncope Etiology unclear, neuro exam benign,  declines mri brain or neuro for now, to f/u cardiology as planned, and out of abundance of caution will change losartan  hct to losartan  100 mg every day, cont all other med  Hypertension, uncontrolled BP Readings from Last 3 Encounters:  02/15/23 136/84  02/12/23 124/60  01/14/23 108/60   Stable, pt to continue medical treatment except change losartan  hct to losartan  100 d, cont inderal  20 at bedtime and calan  SR 240 at bedtime but note 2 negative chronotropes , declines card monitor for now   Type 2 diabetes mellitus without complication, without long-term current use of insulin  (HCC) Lab Results  Component Value Date   HGBA1C 6.3 (A) 01/14/2023   Stable, pt to continue current medical treatment farxiga  10 every day, metformin  1000 bid,, ozempic  2 mg weekly   Vitamin  D deficiency Last vitamin D  Lab Results  Component Value Date   VD25OH 37.88 10/23/2022   Low, to start oral replacement  Followup: Return in about 6 weeks (around 03/28/2023).  Lynwood Rush, MD 02/17/2023 9:16 PM [facility]

## 2023-02-15 NOTE — Patient Instructions (Addendum)
 Ok to change the losartan  HCT to:  losartan  100 mg per day  Please continue all other medications as before, and refills have been done if requested.  Please have the pharmacy call with any other refills you may need.  Please continue your efforts at being more active, low cholesterol diet, and weight control.  Please keep your appointments with your specialists as you may have planned - Cardiology on Feb 7  Please make an Appointment to return in Feb 20, or sooner if needed

## 2023-02-17 ENCOUNTER — Encounter: Payer: Self-pay | Admitting: Internal Medicine

## 2023-02-17 NOTE — Assessment & Plan Note (Signed)
 BP Readings from Last 3 Encounters:  02/15/23 136/84  02/12/23 124/60  01/14/23 108/60   Stable, pt to continue medical treatment except change losartan  hct to losartan  100 d, cont inderal  20 at bedtime and calan  SR 240 at bedtime but note 2 negative chronotropes , declines card monitor for now

## 2023-02-17 NOTE — Assessment & Plan Note (Addendum)
 Etiology unclear, neuro exam benign, declines mri brain or neuro for now, to f/u cardiology as planned, and out of abundance of caution will change losartan hct to losartan 100 mg every day, cont all other med

## 2023-02-17 NOTE — Assessment & Plan Note (Signed)
 Lab Results  Component Value Date   HGBA1C 6.3 (A) 01/14/2023   Stable, pt to continue current medical treatment farxiga 10 every day, metformin 1000 bid,, ozempic 2 mg weekly

## 2023-02-17 NOTE — Assessment & Plan Note (Signed)
 Last vitamin D Lab Results  Component Value Date   VD25OH 37.88 10/23/2022   Low, to start oral replacement

## 2023-02-21 ENCOUNTER — Ambulatory Visit: Payer: 59 | Admitting: Physician Assistant

## 2023-02-27 ENCOUNTER — Telehealth: Payer: Self-pay | Admitting: Family Medicine

## 2023-02-27 NOTE — Telephone Encounter (Signed)
        I called pt and apologized. Relayed I heard back from [facility] and someone should be calling him. I asked him to let me know if they do not call him today. He verbalized understanding.

## 2023-02-27 NOTE — Telephone Encounter (Addendum)
Sent community message to [place] asking for them to call patient today with update. Waiting on response

## 2023-02-27 NOTE — Telephone Encounter (Signed)
Pt calling back to let you know have spoken with [facility]. They said someone will be calling me today to set up appt to pick up CPAP.

## 2023-02-27 NOTE — Telephone Encounter (Signed)
Patient LVM at 11:43 am Calling in regards with CPAP machine that have yet to receive. Have been ordered since September. Having trouble with [facility]. If  someone would please give me a call. Because seem like they are not doing anything for me. You all may need to call them. Please give me a call.

## 2023-02-27 NOTE — Telephone Encounter (Signed)
Noted  

## 2023-03-12 ENCOUNTER — Ambulatory Visit: Payer: 59 | Admitting: Family Medicine

## 2023-03-15 ENCOUNTER — Other Ambulatory Visit: Payer: Self-pay | Admitting: Family Medicine

## 2023-03-18 ENCOUNTER — Ambulatory Visit: Payer: Self-pay | Admitting: Internal Medicine

## 2023-03-18 ENCOUNTER — Ambulatory Visit: Payer: 59 | Admitting: Family Medicine

## 2023-03-18 ENCOUNTER — Telehealth: Payer: Self-pay | Admitting: Family Medicine

## 2023-03-18 VITALS — BP 168/86 | HR 75 | Temp 97.6°F | Resp 18 | Ht 68.0 in | Wt 211.0 lb

## 2023-03-18 DIAGNOSIS — I10 Essential (primary) hypertension: Secondary | ICD-10-CM

## 2023-03-18 MED ORDER — CLONIDINE HCL 0.1 MG PO TABS: 0.1000 mg | ORAL_TABLET | Freq: Once | ORAL | Status: DC

## 2023-03-18 MED ORDER — DICLOFENAC SODIUM 1 % EX GEL: 2.0000 g | Freq: Four times a day (QID) | CUTANEOUS | 1 refills | Status: AC | PRN

## 2023-03-18 NOTE — Telephone Encounter (Signed)
 Pt set up 03/05/23. Follow up 05/29/23.

## 2023-03-18 NOTE — Telephone Encounter (Signed)
 Pt has Scheduled his initial CPAP f/u, pt told to bring CPAP and power cord

## 2023-03-18 NOTE — Telephone Encounter (Signed)
  Chief Complaint: hypertension Symptoms: hypertension, headache (states it feels like his usual migraine), dizziness (resolved) Frequency: x today Pertinent Negatives: Patient denies chest pain, SOB, numbness, weakness, blurred vision. Disposition: [] ED /[] Urgent Care (no appt availability in office) / [x] Appointment(In office/virtual)/ []  [facility]/ [] Home Care/ [] Refused Recommended Disposition /[] [facility]/ []  Follow-up with PCP Additional Notes: Patient states he had recent changes made to his BP medications, taken off hydrochlorothiazide . Patient states he missed his dose of Verapamil  last night, took his losartan  this morning around 0500. Patient states by 0730 he had to call out of work due to feeling dizzy or lightheaded. He checked his BP with his home cuff and had readings of 169/104 and 170/109. He states he sat down and drank some water and his dizziness has resolved. He states he has a headache but states he suffers from migraines and "always has a slight headache", reports he took his migraine medication this morning. Patient agreeable to have someone drive him in today for office visit.   Copied from CRM [id number]. Topic: Clinical - Red Word Triage >> Mar 18, 2023  7:45 AM Dimple Francis wrote: Red Word that prompted transfer to Nurse Triage: Patient checked his Blood pressure this morning and it is at 170/109 Reason for Disposition  [1] Systolic BP  >= 200 OR Diastolic >= 120 AND [2] having NO cardiac or neurologic symptoms  Answer Assessment - Initial Assessment Questions 1. BLOOD PRESSURE: "What is the blood pressure?" "Did you take at least two measurements 5 minutes apart?"     169/104, 170/109.  2. ONSET: "When did you take your blood pressure?"     This morning around 0730.  3. HOW: "How did you take your blood pressure?" (e.g., automatic home BP monitor, visiting nurse)     Automatic home BP monitor (arm).  4. HISTORY: "Do you have a history of  high blood pressure?"     Yes.  5. MEDICINES: "Are you taking any medicines for blood pressure?" "Have you missed any doses recently?"     He states he missed a dose of verapamil  last night. He states he took his losartan  this morning around 0500.  6. OTHER SYMPTOMS: "Do you have any symptoms?" (e.g., blurred vision, chest pain, difficulty breathing, headache, weakness)     Dizzy/lightheaded while sitting down this morning, headache (states he has migraine headaches all the time and states this feels similar)  Protocols used: Blood Pressure - High-A-AH

## 2023-03-18 NOTE — Progress Notes (Signed)
 Assessment & Plan:  1. Hypertension, uncontrolled (Primary) Uncontrolled. No medication available in the office to lower blood pressure. Patient was advised to go home and take 1/2 tablet of his Verapamil  now and resume normal dosing later this evening before going to bed.    Follow up plan: Return if symptoms worsen or fail to improve, for as scheduled with PCP.  Hershel Los, MSN, APRN, FNP-C  Subjective:  HPI: Jason Ortega is a 58 y.o. male presenting on 03/18/2023 for Hypertension (Woke this morning feeling light-headed and dizzy. Checked BP and got readings: 169/104, 159/85 and 170/109. States he did miss one verapamil  last night - all other doses were normal. )  Patient is accompanied by his friend, who he is okay with being present.  Patient reports he woke up this morning feeling light-headed and dizzy. Denies chest pain, jaw pain, shortness of breath, and vision changes. He checked his blood pressure and got the following readings: 169/104, 159/85, and 170/109. He did miss his Verapamil  last night and has not taken it this morning. His hydrochlorothiazide  was discontinued last month after his second syncopal episode. He states he had none of these symptoms during his syncopal events. He was seen by cardiology three days ago at which time it was felt per patient his syncopal episodes were not cardiac related. He is already established with neurology and plans to follow-up with them.    ROS: Negative unless specifically indicated above in HPI.   Relevant past medical history reviewed and updated as indicated.   Allergies and medications reviewed and updated.   Current Outpatient Medications:    aspirin  EC 325 MG tablet, Take 1 tablet (325 mg total) by mouth daily., Disp: 30 tablet, Rfl: 99   atorvastatin  (LIPITOR) 40 MG tablet, TAKE 1 TABLET BY MOUTH EVERY DAY, Disp: 90 tablet, Rfl: 3   Blood Glucose Monitoring Suppl (ONETOUCH VERIO) w/Device KIT, 1 Device by Does not apply  route 2 (two) times daily. E11.9, Disp: 1 kit, Rfl: 0   Blood Pressure Monitoring (BLOOD PRESSURE DIGITAL SOLN) KIT, , Disp: , Rfl:    chlorproMAZINE  (THORAZINE ) 25 MG tablet, Take 1 tablet (25 mg total) by mouth 3 (three) times daily as needed., Disp: 30 tablet, Rfl: 0   dapagliflozin  propanediol (FARXIGA ) 10 MG TABS tablet, Take 1 tablet (10 mg total) by mouth daily., Disp: 90 tablet, Rfl: 3   diclofenac  sodium (VOLTAREN ) 1 % GEL, Apply 2 g topically 4 (four) times daily., Disp: 200 g, Rfl: 5   dicyclomine  (BENTYL ) 10 MG capsule, TAKE 1 CAPSULE (10 MG TOTAL) BY MOUTH 4 (FOUR) TIMES DAILY - BEFORE MEALS AND AT BEDTIME., Disp: 90 capsule, Rfl: 3   fluticasone  (FLONASE ) 50 MCG/ACT nasal spray, Place 2 sprays into both nostrils daily for 14 days., Disp: 9.9 mL, Rfl: 2   gabapentin  (NEURONTIN ) 300 MG capsule, Take 1 capsule (300 mg total) by mouth at bedtime., Disp: 30 capsule, Rfl: 0   Galcanezumab -gnlm (EMGALITY ) 120 MG/ML SOAJ, Inject 120 mg into the skin every 30 (thirty) days., Disp: 3 mL, Rfl: 3   glucose blood (ONETOUCH VERIO) test strip, 1 each by Other route 2 (two) times daily. E11.9, Disp: 100 each, Rfl: 3   ibuprofen  (ADVIL ,MOTRIN ) 600 MG tablet, TAKE 1 TABLET(600 MG) BY MOUTH EVERY 8 HOURS AS NEEDED, Disp: 30 tablet, Rfl: 0   losartan  (COZAAR ) 100 MG tablet, Take 1 tablet (100 mg total) by mouth daily., Disp: 90 tablet, Rfl: 3   meloxicam  (MOBIC ) 15 MG tablet,  TAKE 1 TABLET (15 MG TOTAL) BY MOUTH DAILY., Disp: 30 tablet, Rfl: 0   metFORMIN  (GLUCOPHAGE ) 1000 MG tablet, Take 1 tablet (1,000 mg total) by mouth 2 (two) times daily with a meal., Disp: 180 tablet, Rfl: 3   methocarbamol  (ROBAXIN ) 500 MG tablet, TAKE 1 TABLET BY MOUTH TWICE DAILY, Disp: 20 tablet, Rfl: 0   OneTouch Delica Lancets 30G MISC, 1 each by Does not apply route 2 (two) times daily. E11.9, Disp: , Rfl:    pantoprazole  (PROTONIX ) 40 MG tablet, Take 1 tablet (40 mg total) by mouth daily. (Patient taking differently: Take 40  mg by mouth 2 (two) times daily.), Disp: 90 tablet, Rfl: 3   PENNSAID  2 % SOLN, SMARTSIG:2 Pump Topical Twice Daily, Disp: , Rfl:    propranolol  (INDERAL ) 20 MG tablet, Take 1 tablet (20 mg total) by mouth at bedtime., Disp: 90 tablet, Rfl: 1   Rimegepant Sulfate (NURTEC) 75 MG TBDP, Take 1 tablet (75 mg total) by mouth as needed (Take 1 tablet at onset of migraine ( Do not exceed more than 1 tablet in 24 hours))., Disp: 8 tablet, Rfl: 11   rizatriptan  (MAXALT -MLT) 10 MG disintegrating tablet, TAKE 1 TABLET BY MOUTH AS NEEDED FOR MIGRAINE. MAY REPEAT IN 2 HOURS IF NEEDED, Disp: 9 tablet, Rfl: 11   Semaglutide , 2 MG/DOSE, (OZEMPIC , 2 MG/DOSE,) 8 MG/3ML SOPN, Inject 2 mg into the skin once a week., Disp: 9 mL, Rfl: 3   sildenafil (REVATIO) 20 MG tablet, Take 20 mg by mouth. 3-5 tabs prn, Disp: , Rfl:    timolol (TIMOPTIC-XR) 0.5 % ophthalmic gel-forming, Place 1 drop into both eyes daily. , Disp: , Rfl:    verapamil  (CALAN -SR) 240 MG CR tablet, TAKE 1 TABLET BY MOUTH AT BEDTIME., Disp: 90 tablet, Rfl: 3  Allergies  Allergen Reactions   Pollen Extract Other (See Comments)    Stuffy nose    Objective:   BP (!) 168/86   Pulse 75   Temp 97.6 F (36.4 C)   Resp 18   Ht 5\' 8"  (1.727 m)   Wt 211 lb (95.7 kg)   SpO2 98%   BMI 32.08 kg/m    Physical Exam Vitals reviewed.  Constitutional:      General: He is not in acute distress.    Appearance: Normal appearance. He is not ill-appearing, toxic-appearing or diaphoretic.  HENT:     Head: Normocephalic and atraumatic.  Eyes:     General: No scleral icterus.       Right eye: No discharge.        Left eye: No discharge.     Conjunctiva/sclera: Conjunctivae normal.  Cardiovascular:     Rate and Rhythm: Normal rate and regular rhythm.     Heart sounds: Normal heart sounds. No murmur heard.    No friction rub. No gallop.  Pulmonary:     Effort: Pulmonary effort is normal. No respiratory distress.     Breath sounds: Normal breath sounds.  No stridor. No wheezing, rhonchi or rales.  Musculoskeletal:        General: Normal range of motion.     Cervical back: Normal range of motion.  Skin:    General: Skin is warm and dry.  Neurological:     Mental Status: He is alert and oriented to person, place, and time. Mental status is at baseline.  Psychiatric:        Mood and Affect: Mood normal.        Behavior: Behavior  normal.        Thought Content: Thought content normal.        Judgment: Judgment normal.

## 2023-03-28 ENCOUNTER — Ambulatory Visit: Payer: 59 | Admitting: Internal Medicine

## 2023-04-02 ENCOUNTER — Other Ambulatory Visit: Payer: Self-pay

## 2023-04-02 ENCOUNTER — Other Ambulatory Visit: Payer: Self-pay | Admitting: Internal Medicine

## 2023-04-08 ENCOUNTER — Ambulatory Visit: Payer: 59 | Admitting: Internal Medicine

## 2023-04-09 NOTE — Progress Notes (Unsigned)
 [facility] [address] [place] [address] Phone: [phone] Subjective:   Jason Ortega, am serving as a scribe for Dr. Antoine Primas.  I'm seeing this patient by the request  of:  Corwin Levins, MD  CC: Right knee pain follow-up  [id number]  02/12/2023 I believe that this is an exacerbation of a chronic problem with somewhat worsening symptoms.  We discussed continuing with the home exercises, the icing regimen, which activities to do and which ones to avoid, increase activity slowly.  Follow-up with me again in 6 to 8 weeks otherwise.  Worsening pain consider the possibility of viscosupplementation.  Patient has done well with the injection previously well.      Update 04/11/2023 Jason Ortega is a 58 y.o. male coming in with complaint of R knee pain. Patient states that injection was helpful. Pain in hamstring that radiates down lateral aspect of calf. Hamstrings feel very tight. Less grinding in knee since injection. Wearing knee brace at work.        Past Medical History:  Diagnosis Date   Allergy    Anemia    Arthritis    Cataract    bilateral cataracts removed   Depression    Diabetes mellitus    Frequent headaches    GERD (gastroesophageal reflux disease)    Glaucoma    Hypercholesteremia    Hypertension    Migraines    Sleep apnea    wears c-PAP   Past Surgical History:  Procedure Laterality Date   bilateral cataracts removed     CARDIAC CATHETERIZATION  12/2011   COLONOSCOPY     LEFT HEART CATHETERIZATION WITH CORONARY ANGIOGRAM N/A 11/12/2011   Procedure: LEFT HEART CATHETERIZATION WITH CORONARY ANGIOGRAM;  Surgeon: Pamella Pert, MD;  Location: [facility] CATH LAB;  Service: Cardiovascular;  Laterality: N/A;   SHOULDER SURGERY     Social History   Socioeconomic History   Marital status: Single    Spouse name: Not on file   Number of children: 2   Years of education: 32   Highest education level: 12th grade   Occupational History   Occupation: Nature conservation officer  Tobacco Use   Smoking status: Never   Smokeless tobacco: Never  Vaping Use   Vaping status: Never Used  Substance and Sexual Activity   Alcohol use: Yes    Comment: occasional   Drug use: No   Sexual activity: Yes    Birth control/protection: None  Other Topics Concern   Not on file  Social History Narrative   Fun/Hobby: Coach football   Social Drivers of Health   Financial Resource Strain: Medium Risk (03/18/2023)   Overall Financial Resource Strain (CARDIA)    Difficulty of Paying Living Expenses: Somewhat hard  Food Insecurity: Food Insecurity Present (03/18/2023)   Hunger Vital Sign    Worried About Running Out of Food in the Last Year: Sometimes true    Ran Out of Food in the Last Year: Sometimes true  Transportation Needs: No Transportation Needs (03/18/2023)   PRAPARE - Administrator, Civil Service (Medical): No    Lack of Transportation (Non-Medical): No  Physical Activity: Sufficiently Active (03/18/2023)   Exercise Vital Sign    Days of Exercise per Week: 6 days    Minutes of Exercise per Session: 60 min  Stress: Stress Concern Present (03/18/2023)   [employer] of Occupational Health - Occupational Stress Questionnaire    Feeling of Stress : To some extent  Social Connections: Moderately Isolated (03/18/2023)   Social Connection and Isolation Panel [NHANES]    Frequency of Communication with Friends and Family: More than three times a week    Frequency of Social Gatherings with Friends and Family: Once a week    Attends Religious Services: More than 4 times per year    Active Member of [employer] or Organizations: No    Attends Engineer, structural: Not on file    Marital Status: Divorced   Allergies  Allergen Reactions   Pollen Extract Other (See Comments)    Stuffy nose   Family History  Problem Relation Age of Onset   Arthritis Mother    Stroke Mother    Hypertension Father    Diabetes  Father    Cancer Father        Multiple Myleoma   Diabetes Maternal Grandfather    Colon cancer Neg Hx    Esophageal cancer Neg Hx    Rectal cancer Neg Hx    Stomach cancer Neg Hx    Sleep apnea Neg Hx     Current Outpatient Medications (Endocrine & Metabolic):    dapagliflozin propanediol (FARXIGA) 10 MG TABS tablet, Take 1 tablet (10 mg total) by mouth daily.   metFORMIN (GLUCOPHAGE) 1000 MG tablet, Take 1 tablet (1,000 mg total) by mouth 2 (two) times daily with a meal.   Semaglutide, 2 MG/DOSE, (OZEMPIC, 2 MG/DOSE,) 8 MG/3ML SOPN, Inject 2 mg into the skin once a week.  Current Outpatient Medications (Cardiovascular):    atorvastatin (LIPITOR) 40 MG tablet, TAKE 1 TABLET BY MOUTH EVERY DAY   losartan (COZAAR) 100 MG tablet, Take 1 tablet (100 mg total) by mouth daily.   propranolol (INDERAL) 20 MG tablet, Take 1 tablet (20 mg total) by mouth at bedtime.   sildenafil (REVATIO) 20 MG tablet, Take 20 mg by mouth. 3-5 tabs prn   verapamil (CALAN-SR) 240 MG CR tablet, TAKE 1 TABLET BY MOUTH AT BEDTIME.  Current Outpatient Medications (Respiratory):    fluticasone (FLONASE) 50 MCG/ACT nasal spray, Place 2 sprays into both nostrils daily for 14 days.  Current Outpatient Medications (Analgesics):    aspirin EC 325 MG tablet, Take 1 tablet (325 mg total) by mouth daily.   Galcanezumab-gnlm (EMGALITY) 120 MG/ML SOAJ, Inject 120 mg into the skin every 30 (thirty) days.   ibuprofen (ADVIL,MOTRIN) 600 MG tablet, TAKE 1 TABLET(600 MG) BY MOUTH EVERY 8 HOURS AS NEEDED   meloxicam (MOBIC) 15 MG tablet, TAKE 1 TABLET (15 MG TOTAL) BY MOUTH DAILY.   Rimegepant Sulfate (NURTEC) 75 MG TBDP, Take 1 tablet (75 mg total) by mouth as needed (Take 1 tablet at onset of migraine ( Do not exceed more than 1 tablet in 24 hours)).   rizatriptan (MAXALT-MLT) 10 MG disintegrating tablet, TAKE 1 TABLET BY MOUTH AS NEEDED FOR MIGRAINE. MAY REPEAT IN 2 HOURS IF NEEDED   Current Outpatient Medications (Other):     Blood Glucose Monitoring Suppl (ONETOUCH VERIO) w/Device KIT, 1 Device by Does not apply route 2 (two) times daily. E11.9   Blood Pressure Monitoring (BLOOD PRESSURE DIGITAL SOLN) KIT,    chlorproMAZINE (THORAZINE) 25 MG tablet, Take 1 tablet (25 mg total) by mouth 3 (three) times daily as needed.   diclofenac Sodium (VOLTAREN) 1 % GEL, Apply 2 g topically 4 (four) times daily as needed.   dicyclomine (BENTYL) 10 MG capsule, TAKE 1 CAPSULE (10 MG TOTAL) BY MOUTH 4 (FOUR) TIMES DAILY - BEFORE MEALS AND AT BEDTIME.  gabapentin (NEURONTIN) 300 MG capsule, Take 1 capsule (300 mg total) by mouth at bedtime.   glucose blood (ONETOUCH VERIO) test strip, 1 each by Other route 2 (two) times daily. E11.9   methocarbamol (ROBAXIN) 500 MG tablet, TAKE 1 TABLET BY MOUTH TWICE DAILY   OneTouch Delica Lancets 30G MISC, 1 each by Does not apply route 2 (two) times daily. E11.9   pantoprazole (PROTONIX) 40 MG tablet, TAKE 1 TABLET BY MOUTH TWICE A DAY   PENNSAID 2 % SOLN, SMARTSIG:2 Pump Topical Twice Daily   timolol (TIMOPTIC-XR) 0.5 % ophthalmic gel-forming, Place 1 drop into both eyes daily.    Reviewed prior external information including notes and imaging from  primary care provider As well as notes that were available from care everywhere and other healthcare systems.  Past medical history, social, surgical and family history all reviewed in electronic medical record.  No pertanent information unless stated regarding to the chief complaint.   Review of Systems:  No headache, visual changes, nausea, vomiting, diarrhea, constipation, dizziness, abdominal pain, skin rash, fevers, chills, night sweats, weight loss, swollen lymph nodes,, joint swelling, chest pain, shortness of breath, mood changes. POSITIVE muscle aches, body aches  Objective  Blood pressure (!) 126/92, pulse 75, height 5\' 8"  (1.727 m), weight 213 lb (96.6 kg), SpO2 98%.   General: No apparent distress alert and oriented x3 mood and  affect normal, dressed appropriately.  HEENT: Pupils equal, extraocular movements intact  Respiratory: Patient's speak in full sentences and does not appear short of breath  Cardiovascular: No lower extremity edema, non tender, no erythema  Right knee exam shows a patient does have good range of motion of the patella at the moment.  Still some mild positive patellar grind but no significant swelling noted. Tightness of the right hamstring compared to the contralateral side.  Seems to be more on the lateral aspect.  Some mild discomfort noted of the knee but honestly the least amount of swelling we have seen previously.   Impression and Recommendations:    The above documentation has been reviewed and is accurate and complete Judi Saa, DO

## 2023-04-11 ENCOUNTER — Other Ambulatory Visit: Payer: Self-pay

## 2023-04-11 ENCOUNTER — Ambulatory Visit: Payer: 59 | Admitting: Family Medicine

## 2023-04-11 ENCOUNTER — Encounter: Payer: Self-pay | Admitting: Family Medicine

## 2023-04-11 VITALS — BP 126/92 | HR 75 | Ht 68.0 in | Wt 213.0 lb

## 2023-04-11 DIAGNOSIS — M25561 Pain in right knee: Secondary | ICD-10-CM | POA: Diagnosis not present

## 2023-04-11 DIAGNOSIS — G8929 Other chronic pain: Secondary | ICD-10-CM

## 2023-04-11 DIAGNOSIS — S76311D Strain of muscle, fascia and tendon of the posterior muscle group at thigh level, right thigh, subsequent encounter: Secondary | ICD-10-CM | POA: Diagnosis not present

## 2023-04-11 NOTE — Patient Instructions (Signed)
 Hamstring exercises Heel lifts Thigh compression sleeve Update me in 2 weeks See me again in 2 months

## 2023-04-11 NOTE — Assessment & Plan Note (Signed)
 Appears to be more of an aggravation.  Has had this intermittently but has been some years since we have seen it.  Patient has compressible blood vessels and no recent travel history.  Does do a lot of walking.  Knows if any worsening swelling to seek medical attention.  We discussed thigh compression, heel lifts in the shoes, recovery sandals in the house.  Follow-up again in 6 to 8 weeks.  Would look at the knee again in great detail if necessary at next exam.

## 2023-04-12 ENCOUNTER — Other Ambulatory Visit: Payer: Self-pay | Admitting: Family Medicine

## 2023-04-18 ENCOUNTER — Telehealth: Payer: Self-pay | Admitting: Family Medicine

## 2023-04-18 NOTE — Telephone Encounter (Signed)
 FMLA Form Pd $50 put in Amy's box/Fyi to [facility]

## 2023-04-19 DIAGNOSIS — Z0289 Encounter for other administrative examinations: Secondary | ICD-10-CM

## 2023-04-23 ENCOUNTER — Telehealth: Payer: Self-pay | Admitting: *Deleted

## 2023-04-23 NOTE — Telephone Encounter (Signed)
 Pt form faxed on 04/23/2023 copy @ front desk for pt

## 2023-05-15 ENCOUNTER — Encounter: Payer: Self-pay | Admitting: Endocrinology

## 2023-05-15 ENCOUNTER — Ambulatory Visit (INDEPENDENT_AMBULATORY_CARE_PROVIDER_SITE_OTHER): Payer: 59 | Admitting: Endocrinology

## 2023-05-15 VITALS — BP 148/90 | HR 66 | Resp 20 | Ht 68.0 in | Wt 217.8 lb

## 2023-05-15 DIAGNOSIS — Z7984 Long term (current) use of oral hypoglycemic drugs: Secondary | ICD-10-CM

## 2023-05-15 DIAGNOSIS — Z7985 Long-term (current) use of injectable non-insulin antidiabetic drugs: Secondary | ICD-10-CM | POA: Diagnosis not present

## 2023-05-15 DIAGNOSIS — E1165 Type 2 diabetes mellitus with hyperglycemia: Secondary | ICD-10-CM | POA: Diagnosis not present

## 2023-05-15 LAB — POCT GLYCOSYLATED HEMOGLOBIN (HGB A1C): Hemoglobin A1C: 6.2 % — AB (ref 4.0–5.6)

## 2023-05-15 NOTE — Progress Notes (Signed)
 Outpatient Endocrinology Note [place] Walther Sanagustin, MD  05/15/23  Patient's Name: Jason Ortega    DOB: 1965-12-24    MRN: [id number]                                                    REASON OF VISIT:  Follow up for type 2 diabetes mellitus  PCP: Corwin Levins, MD  HISTORY OF PRESENT ILLNESS:   Jason Ortega is a 58 y.o. old male with past medical history listed below, is here for follow up for type 2 diabetes mellitus.  Patient was previously seen by Dr. Lucianne Muss and was last time seen in April 2024.  Pertinent Diabetes History: Patient was diagnosed with type 2 diabetes mellitus in 1999.  He has controlled type 2 diabetes mellitus.  Chronic Diabetes Complications : Retinopathy: no. Last ophthalmology exam was done on annually, following with ophthalmology regularly.  Nephropathy: no, on ACE/ARB / losartan, farxiga Peripheral neuropathy: no Coronary artery disease: no Stroke: no  Relevant comorbidities and cardiovascular risk factors: Obesity: yes Body mass index is 33.12 kg/m.  Hypertension: Yes  Hyperlipidemia : Yes, on statin   Current / Home Diabetic regimen includes:  Non-insulin hypoglycemic drugs: Metformin 1000 mg 2 times a day, Ozempic 2 mg weekly, Farxiga 10 mg daily   Prior diabetic medications: Ozempic was started in 2020.  He had to use insulin in 2020 for a few months.  He had used Amaryl, Byetta and Prandin in the past.  Glycemic data:   He has not been checking blood sugar at home.  Hypoglycemia: Patient has ? hypoglycemic episodes. Patient has hypoglycemia awareness.  Factors modifying glucose control: 1.  Diabetic diet assessment: 3 meals a day.  2.  Staying active or exercising: Walks about 10 to 12 miles per day, he works for [employer].  3.  Medication compliance: compliant all of the time.  Interval history  Hemoglobin A1c today 6.2%.  Diabetes regimen as reviewed and noted above.  He is overall feeling good.  Tolerating Ozempic well.  No new complaints.  No  numbness and tingling feet and no vision problem.  He has a diabetic eye exam next month scheduled.   REVIEW OF SYSTEMS As per history of present illness.   PAST MEDICAL HISTORY: Past Medical History:  Diagnosis Date   Allergy    Anemia    Arthritis    Cataract    bilateral cataracts removed   Depression    Diabetes mellitus    Frequent headaches    GERD (gastroesophageal reflux disease)    Glaucoma    Hypercholesteremia    Hypertension    Migraines    Sleep apnea    wears c-PAP    PAST SURGICAL HISTORY: Past Surgical History:  Procedure Laterality Date   bilateral cataracts removed     CARDIAC CATHETERIZATION  12/2011   COLONOSCOPY     LEFT HEART CATHETERIZATION WITH CORONARY ANGIOGRAM N/A 11/12/2011   Procedure: LEFT HEART CATHETERIZATION WITH CORONARY ANGIOGRAM;  Surgeon: Pamella Pert, MD;  Location: [facility] CATH LAB;  Service: Cardiovascular;  Laterality: N/A;   SHOULDER SURGERY      ALLERGIES: Allergies  Allergen Reactions   Pollen Extract Other (See Comments)    Stuffy nose    FAMILY HISTORY:  Family History  Problem Relation Age of Onset   Arthritis Mother  Stroke Mother    Hypertension Father    Diabetes Father    Cancer Father        Multiple Myleoma   Diabetes Maternal Grandfather    Colon cancer Neg Hx    Esophageal cancer Neg Hx    Rectal cancer Neg Hx    Stomach cancer Neg Hx    Sleep apnea Neg Hx     SOCIAL HISTORY: Social History   Socioeconomic History   Marital status: Single    Spouse name: Not on file   Number of children: 2   Years of education: 13   Highest education level: 12th grade  Occupational History   Occupation: Nature conservation officer  Tobacco Use   Smoking status: Never   Smokeless tobacco: Never  Vaping Use   Vaping status: Never Used  Substance and Sexual Activity   Alcohol use: Yes    Comment: occasional   Drug use: No   Sexual activity: Yes    Birth control/protection: None  Other Topics Concern   Not on file   Social History Narrative   Fun/Hobby: Psychologist, occupational football   Social Drivers of Health   Financial Resource Strain: Medium Risk (03/18/2023)   Overall Financial Resource Strain (CARDIA)    Difficulty of Paying Living Expenses: Somewhat hard  Food Insecurity: Food Insecurity Present (03/18/2023)   Hunger Vital Sign    Worried About Running Out of Food in the Last Year: Sometimes true    Ran Out of Food in the Last Year: Sometimes true  Transportation Needs: No Transportation Needs (03/18/2023)   PRAPARE - Administrator, Civil Service (Medical): No    Lack of Transportation (Non-Medical): No  Physical Activity: Sufficiently Active (03/18/2023)   Exercise Vital Sign    Days of Exercise per Week: 6 days    Minutes of Exercise per Session: 60 min  Stress: Stress Concern Present (03/18/2023)   [employer] of Occupational Health - Occupational Stress Questionnaire    Feeling of Stress : To some extent  Social Connections: Moderately Isolated (03/18/2023)   Social Connection and Isolation Panel [NHANES]    Frequency of Communication with Friends and Family: More than three times a week    Frequency of Social Gatherings with Friends and Family: Once a week    Attends Religious Services: More than 4 times per year    Active Member of [employer] or Organizations: No    Attends Engineer, structural: Not on file    Marital Status: Divorced    MEDICATIONS:  Current Outpatient Medications  Medication Sig Dispense Refill   aspirin EC 325 MG tablet Take 1 tablet (325 mg total) by mouth daily. 30 tablet 99   atorvastatin (LIPITOR) 40 MG tablet TAKE 1 TABLET BY MOUTH EVERY DAY 90 tablet 3   Blood Glucose Monitoring Suppl (ONETOUCH VERIO) w/Device KIT 1 Device by Does not apply route 2 (two) times daily. E11.9 1 kit 0   Blood Pressure Monitoring (BLOOD PRESSURE DIGITAL SOLN) KIT      chlorproMAZINE (THORAZINE) 25 MG tablet Take 1 tablet (25 mg total) by mouth 3 (three) times daily as  needed. 30 tablet 0   dapagliflozin propanediol (FARXIGA) 10 MG TABS tablet Take 1 tablet (10 mg total) by mouth daily. 90 tablet 3   diclofenac Sodium (VOLTAREN) 1 % GEL Apply 2 g topically 4 (four) times daily as needed. 150 g 1   dicyclomine (BENTYL) 10 MG capsule TAKE 1 CAPSULE (10 MG TOTAL) BY MOUTH 4 (FOUR)  TIMES DAILY - BEFORE MEALS AND AT BEDTIME. 90 capsule 3   gabapentin (NEURONTIN) 300 MG capsule Take 1 capsule (300 mg total) by mouth at bedtime. 30 capsule 0   Galcanezumab-gnlm (EMGALITY) 120 MG/ML SOAJ Inject 120 mg into the skin every 30 (thirty) days. 3 mL 3   glucose blood (ONETOUCH VERIO) test strip 1 each by Other route 2 (two) times daily. E11.9 100 each 3   ibuprofen (ADVIL,MOTRIN) 600 MG tablet TAKE 1 TABLET(600 MG) BY MOUTH EVERY 8 HOURS AS NEEDED 30 tablet 0   losartan (COZAAR) 100 MG tablet Take 1 tablet (100 mg total) by mouth daily. 90 tablet 3   meloxicam (MOBIC) 15 MG tablet TAKE 1 TABLET (15 MG TOTAL) BY MOUTH DAILY. 30 tablet 0   metFORMIN (GLUCOPHAGE) 1000 MG tablet Take 1 tablet (1,000 mg total) by mouth 2 (two) times daily with a meal. 180 tablet 3   methocarbamol (ROBAXIN) 500 MG tablet TAKE 1 TABLET BY MOUTH TWICE DAILY 20 tablet 0   OneTouch Delica Lancets 30G MISC 1 each by Does not apply route 2 (two) times daily. E11.9     pantoprazole (PROTONIX) 40 MG tablet TAKE 1 TABLET BY MOUTH TWICE A DAY 180 tablet 3   PENNSAID 2 % SOLN SMARTSIG:2 Pump Topical Twice Daily     propranolol (INDERAL) 20 MG tablet Take 1 tablet (20 mg total) by mouth at bedtime. 90 tablet 1   Rimegepant Sulfate (NURTEC) 75 MG TBDP Take 1 tablet (75 mg total) by mouth as needed (Take 1 tablet at onset of migraine ( Do not exceed more than 1 tablet in 24 hours)). 8 tablet 11   rizatriptan (MAXALT-MLT) 10 MG disintegrating tablet TAKE 1 TABLET BY MOUTH AS NEEDED FOR MIGRAINE. MAY REPEAT IN 2 HOURS IF NEEDED 9 tablet 11   Semaglutide, 2 MG/DOSE, (OZEMPIC, 2 MG/DOSE,) 8 MG/3ML SOPN Inject 2 mg  into the skin once a week. 9 mL 3   sildenafil (REVATIO) 20 MG tablet Take 20 mg by mouth. 3-5 tabs prn     timolol (TIMOPTIC-XR) 0.5 % ophthalmic gel-forming Place 1 drop into both eyes daily.      verapamil (CALAN-SR) 240 MG CR tablet TAKE 1 TABLET BY MOUTH AT BEDTIME. 90 tablet 3   fluticasone (FLONASE) 50 MCG/ACT nasal spray Place 2 sprays into both nostrils daily for 14 days. 9.9 mL 2   No current facility-administered medications for this visit.    PHYSICAL EXAM: Vitals:   05/15/23 0809 05/15/23 0810  BP: (!) 162/92 (!) 148/90  Pulse: 66   Resp: 20   SpO2: 97%   Weight: 217 lb 12.8 oz (98.8 kg)   Height: 5\' 8"  (1.727 m)     Body mass index is 33.12 kg/m.  Wt Readings from Last 3 Encounters:  05/15/23 217 lb 12.8 oz (98.8 kg)  04/11/23 213 lb (96.6 kg)  03/18/23 211 lb (95.7 kg)    General: Well developed, well nourished male in no apparent distress.  HEENT: AT/[place], no external lesions.  Eyes: Conjunctiva clear and no icterus. Neck: Neck supple  Lungs: Respirations not labored Neurologic: Alert, oriented, normal speech Extremities / Skin: Dry.  Psychiatric: Does not appear depressed or anxious  Diabetic Foot Exam - Simple   No data filed    LABS Reviewed Lab Results  Component Value Date   HGBA1C 6.2 (A) 05/15/2023   HGBA1C 6.3 (A) 01/14/2023   HGBA1C 6.7 (H) 10/23/2022   No results found for: "FRUCTOSAMINE" Lab Results  Component Value Date   CHOL 133 10/23/2022   HDL 59.40 10/23/2022   LDLCALC 57 10/23/2022   TRIG 84.0 10/23/2022   CHOLHDL 2 10/23/2022   Lab Results  Component Value Date   MICRALBCREAT 0.7 10/23/2022   MICRALBCREAT 1.2 03/26/2022   Lab Results  Component Value Date   CREATININE 1.39 01/07/2023   Lab Results  Component Value Date   GFR 55.60 (L) 01/07/2023    ASSESSMENT / PLAN  1. Type 2 diabetes mellitus with hyperglycemia, without long-term current use of insulin (HCC)     Diabetes Mellitus type 2, complicated by no  known complication. - Diabetic status / severity: Controlled.  Lab Results  Component Value Date   HGBA1C 6.2 (A) 05/15/2023    - Hemoglobin A1c goal : <6.5%  - Medications: No change.  I) Ozempic 2 mg weekly. II) Farxiga 10 mg daily. III) metformin 1000 mg 2 times a day.  - Home glucose testing: Advised to check blood sugar at least few times a week at different times of the week and also when having symptoms of lightheadedness or weakness.  Glucometer prescribed.  - Discussed/ Gave Hypoglycemia treatment plan.  # Consult : not required at this time.   # Annual urine for microalbuminuria/ creatinine ratio, no microalbuminuria currently, continue ACE/ARB losartan. Last  Lab Results  Component Value Date   MICRALBCREAT 0.7 10/23/2022    # Foot check nightly.  # Annual dilated diabetic eye exams.   - Diet: Make healthy diabetic food choices - Life style / activity / exercise: Discussed.  2. Blood pressure  -  BP Readings from Last 1 Encounters:  05/15/23 (!) 148/90    - Control is in target.  - No change in current plans.  3. Lipid status / Hyperlipidemia - Last  Lab Results  Component Value Date   LDLCALC 57 10/23/2022   - Continue atorvastatin 40 mg daily.  Managed by PCP.  Diagnoses and all orders for this visit:  Type 2 diabetes mellitus with hyperglycemia, without long-term current use of insulin (HCC) -     POCT glycosylated hemoglobin (Hb A1C)     DISPOSITION Follow up in clinic in 5 months suggested.   All questions answered and patient verbalized understanding of the plan.  [place] Tyleah Loh, MD [facility] [address] [place], [address] [place], [place] [address] Phone # [phone]  At least part of this note was generated using voice recognition software. Inadvertent word errors may have occurred, which were not recognized during the proofreading process.

## 2023-05-17 ENCOUNTER — Telehealth: Payer: Self-pay | Admitting: Family Medicine

## 2023-05-17 NOTE — Telephone Encounter (Signed)
 Pt requesting refill of Gabapentin to [facility].

## 2023-05-19 ENCOUNTER — Other Ambulatory Visit: Payer: Self-pay | Admitting: Family Medicine

## 2023-05-20 ENCOUNTER — Other Ambulatory Visit: Payer: Self-pay

## 2023-05-20 MED ORDER — GABAPENTIN 300 MG PO CAPS: 300.0000 mg | ORAL_CAPSULE | Freq: Every day | ORAL | 0 refills | Status: DC

## 2023-05-29 ENCOUNTER — Ambulatory Visit: Payer: 59 | Admitting: Neurology

## 2023-05-29 ENCOUNTER — Encounter: Payer: Self-pay | Admitting: Neurology

## 2023-05-29 VITALS — BP 156/92 | HR 70 | Ht 68.0 in | Wt 214.8 lb

## 2023-05-29 DIAGNOSIS — G4733 Obstructive sleep apnea (adult) (pediatric): Secondary | ICD-10-CM | POA: Diagnosis not present

## 2023-05-29 NOTE — Progress Notes (Signed)
 Subjective:    Patient ID: Jason Ortega is a 58 y.o. male.  HPI    Interim history:   Mr. Trivedi is a 58 year old male with an underlying medical history of migraine headaches, allergies, anemia, arthritis, depression, diabetes, reflux disease, hypertension, hyperlipidemia, and mild obesity, who presents for follow-up consultation of his obstructive sleep apnea after interim sleep testing and starting home AutoPap therapy.  The patient is unaccompanied today.  I first met him at the request of Terrilyn Fick, NP on 09/27/2022, at which time he reported a prior diagnosis of obstructive sleep apnea.  He was advised to proceed with a home sleep test.  He had a home sleep test through our office on 10/14/2022, which showed moderate obstructive sleep apnea with an AHI of 18.2/h, O2 nadir 87% with mild to moderate snoring detected.  I wrote for a new AutoPap machine.  His set up date was 03/05/2023.  He gets his supplies from [place].   Today, 05/29/2023: I reviewed his AutoPap compliance data from 04/28/2023 through 05/27/2023, which is a total of 30 days, during which time he used his machine 26 days with percent use days greater than 4 hours at 87%, indicating very good compliance with an average usage of 6 hours and 59 minutes, residual AHI at goal at 0.9/h, average pressure for the 95th percentile at 13 cm with a range of 7 to 13 cm with EPR of 1.  Leak on the left side with the 95th percentile at 0.1 L/min.  He reports doing well with his new AutoPap machine, his Epworth sleepiness score is 10 out of 24, previously was 19 out of 24.  He uses a hybrid style fullface mask from [employer], [place] and tolerates the interface quite well.  He overall feels that he rests well but does get tired in the afternoon, he endorses benefit but feels that his tiredness is directly linked to long work hours.  He works 10 to 12 hours.  He is on his feet all day.  He has right knee pain and has seen orthopedics, he has a meniscus  tear but no surgery is required, he also was told that he had prior scarring.  He is wearing a brace.   The patient's allergies, current medications, family history, past medical history, past social history, past surgical history and problem list were reviewed and updated as appropriate.   Previously:  09/27/2022: (He) reports using PAP for the past 10 years.  He admits that he is not consistent with the machine as he reports working long hours and often falls asleep without it.  He had sleep testing through [facility] over 10 years ago and has not seen a sleep specialist or provider in several years.  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  Bedtime is generally around 11 and rise time around 5.  Over the course of time he has lost quite a bit of weight, since his original sleep apnea diagnosis probably in the realm of 40 pounds.  He has an Epworth sleepiness score of 19 out of 24, fatigue severity score is 43 out of 63.  He is not aware of any family history of sleep apnea.  He drinks caffeine  in the form of tea and soda, up to 2 servings per day, occasional or rare alcohol, he is a non-smoker.  He lives alone, he has no pets at  the house.  He denies nightly nocturia but has occasional morning headaches.  I reviewed your office note from 08/30/2022.   He would like to get a new CPAP or AutoPap machine.  He did not bring his machine today.  Prior test results are not available for my review today.  A compliance download from his previous Pap machine is not available for my review today.   His Past Medical History Is Significant For: Past Medical History:  Diagnosis Date   Allergy    Anemia    Arthritis    Cataract    bilateral cataracts removed   Depression    Diabetes mellitus    Frequent headaches    GERD (gastroesophageal reflux disease)    Glaucoma    Hypercholesteremia    Hypertension    Migraines     Sleep apnea    wears c-PAP    His Past Surgical History Is Significant For: Past Surgical History:  Procedure Laterality Date   bilateral cataracts removed     CARDIAC CATHETERIZATION  12/2011   COLONOSCOPY     LEFT HEART CATHETERIZATION WITH CORONARY ANGIOGRAM N/A 11/12/2011   Procedure: LEFT HEART CATHETERIZATION WITH CORONARY ANGIOGRAM;  Surgeon: Jessica Morn, MD;  Location: [facility]: [facility];  Service: Cardiovascular;  Laterality: N/A;   SHOULDER SURGERY      His Family History Is Significant For: Family History  Problem Relation Age of Onset   Arthritis Mother    Stroke Mother    Hypertension Father    Diabetes Father    Cancer Father        Multiple Myleoma   Diabetes Maternal Grandfather    Colon cancer Neg Hx    Esophageal cancer Neg Hx    Rectal cancer Neg Hx    Stomach cancer Neg Hx    Sleep apnea Neg Hx     His Social History Is Significant For: Social History   Socioeconomic History   Marital status: Single    Spouse name: Not on file   Number of children: 2   Years of education: 13   Highest education level: 12th grade  Occupational History   Occupation: Nature conservation officer  Tobacco Use   Smoking status: Never   Smokeless tobacco: Never  Vaping Use   Vaping status: Never Used  Substance and Sexual Activity   Alcohol use: Yes    Comment: occasional   Drug use: No   Sexual activity: Yes    Birth control/protection: None  Other Topics Concern   Not on file  Social History Narrative   Fun/Hobby: Psychologist, occupational football   Social Drivers of Health   Financial Resource Strain: Medium Risk (03/18/2023)   Overall Financial Resource Strain (CARDIA)    Difficulty of Paying Living Expenses: Somewhat hard  Food Insecurity: Food Insecurity Present (03/18/2023)   Hunger Vital Sign    Worried About Running Out of Food in the Last Year: Sometimes true    Ran Out of Food in the Last Year: Sometimes true  Transportation Needs: No Transportation Needs (03/18/2023)   PRAPARE -  Administrator, Civil Service (Medical): No    Lack of Transportation (Non-Medical): No  Physical Activity: Sufficiently Active (03/18/2023)   Exercise Vital Sign    Days of Exercise per Week: 6 days    Minutes of Exercise per Session: 60 min  Stress: Stress Concern Present (03/18/2023)   [employer] of Occupational Health - Occupational Stress Questionnaire    Feeling of Stress : To  some extent  Social Connections: Moderately Isolated (03/18/2023)   Social Connection and Isolation Panel [NHANES]    Frequency of Communication with Friends and Family: More than three times a week    Frequency of Social Gatherings with Friends and Family: Once a week    Attends Religious Services: More than 4 times per year    Active Member of [employer] or Organizations: No    Attends Engineer, structural: Not on file    Marital Status: Divorced    His Allergies Are:  Allergies  Allergen Reactions   Pollen Extract Other (See Comments)    Stuffy nose  :   His Current Medications Are:  Outpatient Encounter Medications as of 05/29/2023  Medication Sig   aspirin  EC 325 MG tablet Take 1 tablet (325 mg total) by mouth daily.   atorvastatin  (LIPITOR) 40 MG tablet TAKE 1 TABLET BY MOUTH EVERY DAY   Blood Glucose Monitoring Suppl (ONETOUCH VERIO) w/Device KIT 1 Device by Does not apply route 2 (two) times daily. E11.9   Blood Pressure Monitoring (BLOOD PRESSURE DIGITAL SOLN) KIT    chlorproMAZINE  (THORAZINE ) 25 MG tablet Take 1 tablet (25 mg total) by mouth 3 (three) times daily as needed.   dapagliflozin  propanediol (FARXIGA ) 10 MG TABS tablet Take 1 tablet (10 mg total) by mouth daily.   diclofenac  Sodium (VOLTAREN ) 1 % GEL Apply 2 g topically 4 (four) times daily as needed.   dicyclomine  (BENTYL ) 10 MG capsule TAKE 1 CAPSULE (10 MG TOTAL) BY MOUTH 4 (FOUR) TIMES DAILY - BEFORE MEALS AND AT BEDTIME.   gabapentin  (NEURONTIN ) 300 MG capsule Take 1 capsule (300 mg total) by mouth at  bedtime.   Galcanezumab -gnlm (EMGALITY ) 120 MG/ML SOAJ Inject 120 mg into the skin every 30 (thirty) days.   glucose blood (ONETOUCH VERIO) test strip 1 each by Other route 2 (two) times daily. E11.9   ibuprofen  (ADVIL ,MOTRIN ) 600 MG tablet TAKE 1 TABLET(600 MG) BY MOUTH EVERY 8 HOURS AS NEEDED   losartan  (COZAAR ) 100 MG tablet Take 1 tablet (100 mg total) by mouth daily.   meloxicam  (MOBIC ) 15 MG tablet TAKE 1 TABLET (15 MG TOTAL) BY MOUTH DAILY.   metFORMIN  (GLUCOPHAGE ) 1000 MG tablet Take 1 tablet (1,000 mg total) by mouth 2 (two) times daily with a meal.   methocarbamol  (ROBAXIN ) 500 MG tablet TAKE 1 TABLET BY MOUTH TWICE DAILY   OneTouch Delica Lancets 30G MISC 1 each by Does not apply route 2 (two) times daily. E11.9   pantoprazole  (PROTONIX ) 40 MG tablet TAKE 1 TABLET BY MOUTH TWICE A DAY   PENNSAID  2 % SOLN SMARTSIG:2 Pump Topical Twice Daily   propranolol  (INDERAL ) 20 MG tablet Take 1 tablet (20 mg total) by mouth at bedtime.   Rimegepant Sulfate (NURTEC) 75 MG TBDP Take 1 tablet (75 mg total) by mouth as needed (Take 1 tablet at onset of migraine ( Do not exceed more than 1 tablet in 24 hours)).   rizatriptan  (MAXALT -MLT) 10 MG disintegrating tablet TAKE 1 TABLET BY MOUTH AS NEEDED FOR MIGRAINE. MAY REPEAT IN 2 HOURS IF NEEDED   Semaglutide , 2 MG/DOSE, (OZEMPIC , 2 MG/DOSE,) 8 MG/3ML SOPN Inject 2 mg into the skin once a week.   sildenafil (REVATIO) 20 MG tablet Take 20 mg by mouth. 3-5 tabs prn   timolol (TIMOPTIC-XR) 0.5 % ophthalmic gel-forming Place 1 drop into both eyes daily.    verapamil  (CALAN -SR) 240 MG CR tablet TAKE 1 TABLET BY MOUTH AT BEDTIME.  fluticasone  (FLONASE ) 50 MCG/ACT nasal spray Place 2 sprays into both nostrils daily for 14 days.   No facility-administered encounter medications on file as of 05/29/2023.  :  Review of Systems:  Out of a complete 14 point review of systems, all are reviewed and negative with the exception of these symptoms as listed  below:  Review of Systems  Neurological:        Room 5 Pt is here Alone. Pt states that everything has been okay with his CPAP Machine. ESS 10 FSS 46    Objective:  Neurological Exam  Physical Exam Physical Examination:   Vitals:   05/29/23 0736  BP: (!) 156/92  Pulse: 70    General Examination: The patient is a very pleasant 58 y.o. male in no acute distress. He appears well-developed and well-nourished and well groomed.   HEENT: Normocephalic, atraumatic, pupils are equal, round and reactive to light, extraocular tracking is well-preserved, corrective eyeglasses in place.  Face is symmetric with normal facial animation, speech is clear without dysarthria, hypophonia or voice tremor.  He is wearing a facemask which he removed briefly for the airway examination, he has mild mouth dryness, tongue protrudes centrally and palate elevates symmetrically, stable findings noted otherwise.     Chest: Clear to auscultation without wheezing, rhonchi or crackles noted.   Heart: S1+S2+0, regular and normal without murmurs, rubs or gallops noted.    Abdomen: Soft, non-tender and non-distended.   Extremities: There is no pitting edema in the distal lower extremities bilaterally.    Skin: Warm and dry without trophic changes noted.    Musculoskeletal: exam reveals right knee in a Velcro brace.   Neurologically:  Mental status: The patient is awake, alert and oriented in all 4 spheres. His immediate and remote memory, attention, language skills and fund of knowledge are appropriate. There is no evidence of aphasia, agnosia, apraxia or anomia. Speech is clear with normal prosody and enunciation. Thought process is linear. Mood is normal and affect is normal.  Cranial nerves II - XII are as described above under HEENT exam.  Motor exam: Normal bulk, limited mobility right knee due to brace but otherwise moving freely all 4 extremities, no obvious action or resting tremor.  Fine motor skills and  coordination: grossly intact.  Cerebellar testing: No dysmetria or intention tremor. There is no truncal or gait ataxia.  Sensory exam: intact to light touch in the upper and lower extremities.  Gait, station and balance: He stands easily. No veering to one side is noted. No leaning to one side is noted. Posture is age-appropriate and stance is narrow based. Gait shows normal stride length and normal pace. No problems turning are noted.    Assessment:    In summary, Jason Ortega is a 58 year old male with an underlying medical history of migraine headaches, allergies, anemia, arthritis, depression, diabetes, reflux disease, hypertension, hyperlipidemia, and mild obesity, who presents for follow-up consultation of his obstructive sleep apnea after interim sleep testing and starting home AutoPap therapy.  He had a home sleep test through our office on 10/14/2022, which showed moderate obstructive sleep apnea with an AHI of 18.2/h, O2 nadir 87% with mild to moderate snoring detected.  He has been on a new AutoPap machine since 03/05/2023.  He gets his supplies from [place].  He is compliant with treatment and benefits from it.  His Epworth sleepiness score has reduced as well.  He is commended for his treatment adherence.  He is motivated to continue with  his current AutoPap machine and the settings look good, apnea control is very good, leak from the mask very low.  He is advised to follow-up routinely in this clinic to see Amy Lomax in about a year, we can offer him a video visit if he prefers.  I answered all his questions today and he was in agreement with our plan. I spent 30 minutes in total face-to-face time and in reviewing records during pre-charting, more than 50% of which was spent in counseling and coordination of care, reviewing test results, reviewing medications and treatment regimen and/or in discussing or reviewing the diagnosis of OSA, the prognosis and treatment options. Pertinent laboratory and  imaging test results that were available during this visit with the patient were reviewed by me and considered in my medical decision making (see chart for details).   very pleasant 58 y.o.-year old male with an underlying medical history of migraine headaches, allergies, anemia, arthritis, depression, diabetes, reflux disease, hypertension, hyperlipidemia, and mild obesity, who presents for evaluation of his obstructive sleep apnea.  Of note, as he recalls, he was diagnosed with moderate obstructive sleep apnea several years ago and he has an older PAP machine.  He has worked on weight loss and has lost quite a bit of weight over the course of time.  He is willing to get reevaluated.  We mutually agreed to proceed with a home sleep test at this time.    I had a long chat with the patient about my findings and the diagnosis of sleep apnea, particularly OSA, its prognosis and treatment options. We talked about medical/conservative treatments, surgical interventions and non-pharmacological approaches for symptom control. I explained, in particular, the risks and ramifications of untreated moderate to severe OSA, especially with respect to developing cardiovascular disease down the road, including congestive heart failure (CHF), difficult to treat hypertension, cardiac arrhythmias (particularly A-fib), neurovascular complications including TIA, stroke and dementia. Even type 2 diabetes has, in part, been linked to untreated OSA. Symptoms of untreated OSA may include (but may not be limited to) daytime sleepiness, nocturia (i.e. frequent nighttime urination), memory problems, mood irritability and suboptimally controlled or worsening mood disorder such as depression and/or anxiety, lack of energy, lack of motivation, physical discomfort, as well as recurrent headaches, especially morning or nocturnal headaches. We talked about the importance of maintaining a healthy lifestyle and striving for healthy weight.

## 2023-05-29 NOTE — Patient Instructions (Signed)
 It was nice to see you again today. I am glad to hear, things are going well with your autoPAP therapy. You have adjusted well to treatment with your new machine, and you are compliant with it. You have also fulfilled the insurance-mandated compliance percentage, which is reassuring, so you can get ongoing supplies through your insurance. Please talk to your DME provider about getting replacement supplies on a regular basis. Please be sure to change your filter every month, your mask about every 3 months, hose about every 6 months, humidifier chamber about yearly. Some restrictions are imposed by your insurance carrier with regard to how frequently you can get certain supplies.  Your DME company can provide further details if necessary.   Please continue using your autoPAP regularly. While your insurance requires that you use PAP at least 4 hours each night on 70% of the nights, I recommend, that you not skip any nights and use it throughout the night if you can. Getting used to PAP and staying with the treatment long term does take time and patience and discipline. Untreated obstructive sleep apnea when it is moderate to severe can have an adverse impact on cardiovascular health and raise her risk for heart disease, arrhythmias, hypertension, congestive heart failure, stroke and diabetes. Untreated obstructive sleep apnea causes sleep disruption, nonrestorative sleep, and sleep deprivation. This can have an impact on your day to day functioning and cause daytime sleepiness and impairment of cognitive function, memory loss, mood disturbance, and problems focussing. Using PAP regularly can improve these symptoms.  We can see you in 1 year, you can see Terrilyn Fick, NP, we can offer you a video visit if you like.

## 2023-06-02 ENCOUNTER — Encounter: Payer: Self-pay | Admitting: Family Medicine

## 2023-06-02 ENCOUNTER — Encounter: Payer: Self-pay | Admitting: Internal Medicine

## 2023-06-02 ENCOUNTER — Encounter: Payer: Self-pay | Admitting: Endocrinology

## 2023-06-03 ENCOUNTER — Other Ambulatory Visit: Payer: Self-pay

## 2023-06-03 DIAGNOSIS — E119 Type 2 diabetes mellitus without complications: Secondary | ICD-10-CM

## 2023-06-03 MED ORDER — DAPAGLIFLOZIN PROPANEDIOL 10 MG PO TABS: 10.0000 mg | ORAL_TABLET | Freq: Every day | ORAL | 3 refills | Status: DC

## 2023-06-05 ENCOUNTER — Encounter: Payer: Self-pay | Admitting: Family Medicine

## 2023-06-05 ENCOUNTER — Other Ambulatory Visit: Payer: Self-pay

## 2023-06-05 MED ORDER — MELOXICAM 15 MG PO TABS: 15.0000 mg | ORAL_TABLET | Freq: Every day | ORAL | 0 refills | Status: DC

## 2023-06-07 NOTE — Progress Notes (Unsigned)
 [facility] [address] [place] [address] Phone: [phone] Subjective:   Jason Ortega, am serving as a scribe for Dr. Ronnell Coins.  I'm seeing this patient by the request  of:  Roslyn Coombe, MD  CC:   [id number]  04/11/2023 Appears to be more of an aggravation.  Has had this intermittently but has been some years since we have seen it.  Patient has compressible blood vessels and no recent travel history.  Does do a lot of walking.  Knows if any worsening swelling to seek medical attention.  We discussed thigh compression, heel lifts in the shoes, recovery sandals in the house.  Follow-up again in 6 to 8 weeks.  Would look at the knee again in great detail if necessary at next exam.      Update 06/11/2023 Jason Ortega is a 58 y.o. male coming in with complaint of R hamstring and knee pain. Patient states that his R knee is doing ok but he has developed pain beneath the L patella. Last Thursday all ROMs caused.   Continues to do HEP and use compression sleeve for R knee. Feels a pull that from the knee to the hip. Pain is not as bad as it was previously.        Past Medical History:  Diagnosis Date   Allergy    Anemia    Arthritis    Cataract    bilateral cataracts removed   Depression    Diabetes mellitus    Frequent headaches    GERD (gastroesophageal reflux disease)    Glaucoma    Hypercholesteremia    Hypertension    Migraines    Sleep apnea    wears c-PAP   Past Surgical History:  Procedure Laterality Date   bilateral cataracts removed     CARDIAC CATHETERIZATION  12/2011   COLONOSCOPY     LEFT HEART CATHETERIZATION WITH CORONARY ANGIOGRAM N/A 11/12/2011   Procedure: LEFT HEART CATHETERIZATION WITH CORONARY ANGIOGRAM;  Surgeon: Jessica Morn, MD;  Location: [facility] CATH LAB;  Service: Cardiovascular;  Laterality: N/A;   SHOULDER SURGERY     Social History   Socioeconomic History   Marital status: Single     Spouse name: Not on file   Number of children: 2   Years of education: 79   Highest education level: 12th grade  Occupational History   Occupation: Nature conservation officer  Tobacco Use   Smoking status: Never   Smokeless tobacco: Never  Vaping Use   Vaping status: Never Used  Substance and Sexual Activity   Alcohol use: Yes    Comment: occasional   Drug use: No   Sexual activity: Yes    Birth control/protection: None  Other Topics Concern   Not on file  Social History Narrative   Fun/Hobby: Coach football   Social Drivers of Health   Financial Resource Strain: Medium Risk (03/18/2023)   Overall Financial Resource Strain (CARDIA)    Difficulty of Paying Living Expenses: Somewhat hard  Food Insecurity: Food Insecurity Present (03/18/2023)   Hunger Vital Sign    Worried About Running Out of Food in the Last Year: Sometimes true    Ran Out of Food in the Last Year: Sometimes true  Transportation Needs: No Transportation Needs (03/18/2023)   PRAPARE - Administrator, Civil Service (Medical): No    Lack of Transportation (Non-Medical): No  Physical Activity: Sufficiently Active (03/18/2023)   Exercise Vital Sign  Days of Exercise per Week: 6 days    Minutes of Exercise per Session: 60 min  Stress: Stress Concern Present (03/18/2023)   [employer] of Occupational Health - Occupational Stress Questionnaire    Feeling of Stress : To some extent  Social Connections: Moderately Isolated (03/18/2023)   Social Connection and Isolation Panel [NHANES]    Frequency of Communication with Friends and Family: More than three times a week    Frequency of Social Gatherings with Friends and Family: Once a week    Attends Religious Services: More than 4 times per year    Active Member of [employer] or Organizations: No    Attends Engineer, structural: Not on file    Marital Status: Divorced   Allergies  Allergen Reactions   Pollen Extract Other (See Comments)    Stuffy nose   Family  History  Problem Relation Age of Onset   Arthritis Mother    Stroke Mother    Hypertension Father    Diabetes Father    Cancer Father        Multiple Myleoma   Diabetes Maternal Grandfather    Colon cancer Neg Hx    Esophageal cancer Neg Hx    Rectal cancer Neg Hx    Stomach cancer Neg Hx    Sleep apnea Neg Hx     Current Outpatient Medications (Endocrine & Metabolic):    dapagliflozin  propanediol (FARXIGA ) 10 MG TABS tablet, Take 1 tablet (10 mg total) by mouth daily.   metFORMIN  (GLUCOPHAGE ) 1000 MG tablet, Take 1 tablet (1,000 mg total) by mouth 2 (two) times daily with a meal.   Semaglutide , 2 MG/DOSE, (OZEMPIC , 2 MG/DOSE,) 8 MG/3ML SOPN, Inject 2 mg into the skin once a week.  Current Outpatient Medications (Cardiovascular):    atorvastatin  (LIPITOR) 40 MG tablet, TAKE 1 TABLET BY MOUTH EVERY DAY   losartan  (COZAAR ) 100 MG tablet, Take 1 tablet (100 mg total) by mouth daily.   propranolol  (INDERAL ) 20 MG tablet, Take 1 tablet (20 mg total) by mouth at bedtime.   sildenafil (REVATIO) 20 MG tablet, Take 20 mg by mouth. 3-5 tabs prn   verapamil  (CALAN -SR) 240 MG CR tablet, TAKE 1 TABLET BY MOUTH AT BEDTIME.  Current Outpatient Medications (Respiratory):    fluticasone  (FLONASE ) 50 MCG/ACT nasal spray, Place 2 sprays into both nostrils daily for 14 days.  Current Outpatient Medications (Analgesics):    aspirin  EC 325 MG tablet, Take 1 tablet (325 mg total) by mouth daily.   Galcanezumab -gnlm (EMGALITY ) 120 MG/ML SOAJ, Inject 120 mg into the skin every 30 (thirty) days.   ibuprofen  (ADVIL ,MOTRIN ) 600 MG tablet, TAKE 1 TABLET(600 MG) BY MOUTH EVERY 8 HOURS AS NEEDED   meloxicam  (MOBIC ) 15 MG tablet, Take 1 tablet (15 mg total) by mouth daily.   Rimegepant Sulfate (NURTEC) 75 MG TBDP, Take 1 tablet (75 mg total) by mouth as needed (Take 1 tablet at onset of migraine ( Do not exceed more than 1 tablet in 24 hours)).   rizatriptan  (MAXALT -MLT) 10 MG disintegrating tablet, TAKE 1  TABLET BY MOUTH AS NEEDED FOR MIGRAINE. MAY REPEAT IN 2 HOURS IF NEEDED   Current Outpatient Medications (Other):    Blood Glucose Monitoring Suppl (ONETOUCH VERIO) w/Device KIT, 1 Device by Does not apply route 2 (two) times daily. E11.9   Blood Pressure Monitoring (BLOOD PRESSURE DIGITAL SOLN) KIT,    chlorproMAZINE  (THORAZINE ) 25 MG tablet, Take 1 tablet (25 mg total) by mouth 3 (three) times daily  as needed.   diclofenac  Sodium (VOLTAREN ) 1 % GEL, Apply 2 g topically 4 (four) times daily as needed.   dicyclomine  (BENTYL ) 10 MG capsule, TAKE 1 CAPSULE (10 MG TOTAL) BY MOUTH 4 (FOUR) TIMES DAILY - BEFORE MEALS AND AT BEDTIME.   gabapentin  (NEURONTIN ) 300 MG capsule, Take 1 capsule (300 mg total) by mouth at bedtime.   glucose blood (ONETOUCH VERIO) test strip, 1 each by Other route 2 (two) times daily. E11.9   methocarbamol  (ROBAXIN ) 500 MG tablet, TAKE 1 TABLET BY MOUTH TWICE DAILY   OneTouch Delica Lancets 30G MISC, 1 each by Does not apply route 2 (two) times daily. E11.9   pantoprazole  (PROTONIX ) 40 MG tablet, TAKE 1 TABLET BY MOUTH TWICE A DAY   PENNSAID  2 % SOLN, SMARTSIG:2 Pump Topical Twice Daily   timolol (TIMOPTIC-XR) 0.5 % ophthalmic gel-forming, Place 1 drop into both eyes daily.    Reviewed prior external information including notes and imaging from  primary care provider As well as notes that were available from care everywhere and other healthcare systems.  Past medical history, social, surgical and family history all reviewed in electronic medical record.  No pertanent information unless stated regarding to the chief complaint.   Review of Systems:  No headache, visual changes, nausea, vomiting, diarrhea, constipation, dizziness, abdominal pain, skin rash, fevers, chills, night sweats, weight loss, swollen lymph nodes, body aches, joint swelling, chest pain, shortness of breath, mood changes. POSITIVE muscle aches  Objective  Blood pressure 118/84, pulse 76, height 5\' 8"   (1.727 m), weight 216 lb (98 kg), SpO2 98%.   General: No apparent distress alert and oriented x3 mood and affect normal, dressed appropriately.  HEENT: Pupils equal, extraocular movements intact  Respiratory: Patient's speak in full sentences and does not appear short of breath  Cardiovascular: No lower extremity edema, non tender, no erythema  Patient's knees bilaterally do have some crepitus noted.  Wearing a brace on the right knee.  Left knee shows some mild tenderness to palpation even over the patella itself but no significant swelling.  No instability of the knee itself.  Mild lateral tracking of the patella noted.  Limited muscular skeletal ultrasound was performed and interpreted by Ronnell Coins, M  Limited ultrasound of patient's patella she has some trace of hypoechoic changes consistent with a small effusion noted.  Patient though does also have some calcific changes noted of the insertion of the quadricep tendon on the patella that is consistent with some calcific tendinopathy. Impression: Patellofemoral arthritis    Impression and Recommendations:    The above documentation has been reviewed and is accurate and complete Jessina Marse M Ferry Matthis, DO

## 2023-06-10 ENCOUNTER — Telehealth: Payer: Self-pay

## 2023-06-10 ENCOUNTER — Encounter: Payer: Self-pay | Admitting: Hematology and Oncology

## 2023-06-10 ENCOUNTER — Other Ambulatory Visit (HOSPITAL_COMMUNITY): Payer: Self-pay

## 2023-06-10 DIAGNOSIS — E1165 Type 2 diabetes mellitus with hyperglycemia: Secondary | ICD-10-CM

## 2023-06-10 NOTE — Telephone Encounter (Signed)
 Pharmacy Patient Advocate Encounter   Received notification from CoverMyMeds that prior authorization for Farxiga  is required/requested.   Insurance verification completed.   The patient is insured through [facility] ADVANTAGE/RX ADVANCE .   Per test claim:  Jason Ortega is preferred by the insurance.  If suggested medication is appropriate, Please send in a new RX and discontinue this one. If not, please advise as to why it's not appropriate so that we may request a Prior Authorization. Please note, some preferred medications may still require a PA.  If the suggested medications have not been trialed and there are no contraindications to their use, the PA will not be submitted, as it will not be approved.

## 2023-06-11 ENCOUNTER — Ambulatory Visit (INDEPENDENT_AMBULATORY_CARE_PROVIDER_SITE_OTHER): Admitting: Family Medicine

## 2023-06-11 ENCOUNTER — Encounter: Payer: Self-pay | Admitting: Family Medicine

## 2023-06-11 ENCOUNTER — Other Ambulatory Visit: Payer: Self-pay

## 2023-06-11 ENCOUNTER — Encounter: Payer: Self-pay | Admitting: Endocrinology

## 2023-06-11 VITALS — BP 118/84 | HR 76 | Ht 68.0 in | Wt 216.0 lb

## 2023-06-11 DIAGNOSIS — M25561 Pain in right knee: Secondary | ICD-10-CM | POA: Diagnosis not present

## 2023-06-11 DIAGNOSIS — M2241 Chondromalacia patellae, right knee: Secondary | ICD-10-CM

## 2023-06-11 DIAGNOSIS — M2242 Chondromalacia patellae, left knee: Secondary | ICD-10-CM

## 2023-06-11 MED ORDER — EMPAGLIFLOZIN 25 MG PO TABS: 25.0000 mg | ORAL_TABLET | Freq: Every day | ORAL | 3 refills | Status: AC

## 2023-06-11 NOTE — Telephone Encounter (Signed)
 Sent prescription for Jardiance 25 mg daily.

## 2023-06-11 NOTE — Assessment & Plan Note (Signed)
 Continue to follow on patient.  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  We will need to continue to monitor.  Patient is able to augment some of his activities and exercises.  We discussed icing regimen and home exercises.  He will like brace given for contralateral knee.  Increase activity slowly.  Worsening pain consider injection.  Follow-up again in 2 to 3 months

## 2023-06-11 NOTE — Patient Instructions (Addendum)
 Tru pull lite for L knee Ice at end of long day See me again in 2 months

## 2023-06-11 NOTE — Addendum Note (Signed)
 Addended by: Abhijay Morriss, [place] on: 06/11/2023 04:09 PM   Modules accepted: Orders

## 2023-06-14 NOTE — Telephone Encounter (Signed)
 error

## 2023-06-22 ENCOUNTER — Other Ambulatory Visit: Payer: Self-pay | Admitting: Family Medicine

## 2023-06-23 ENCOUNTER — Encounter: Payer: Self-pay | Admitting: Family Medicine

## 2023-07-05 ENCOUNTER — Other Ambulatory Visit: Payer: Self-pay | Admitting: Family Medicine

## 2023-07-19 ENCOUNTER — Other Ambulatory Visit: Payer: Self-pay

## 2023-07-19 ENCOUNTER — Encounter: Payer: Self-pay | Admitting: Family Medicine

## 2023-07-19 ENCOUNTER — Encounter: Payer: Self-pay | Admitting: Internal Medicine

## 2023-07-19 MED ORDER — IBUPROFEN 800 MG PO TABS: 800.0000 mg | ORAL_TABLET | Freq: Three times a day (TID) | ORAL | 1 refills | Status: DC | PRN

## 2023-07-22 ENCOUNTER — Encounter: Payer: Self-pay | Admitting: Family Medicine

## 2023-07-22 ENCOUNTER — Other Ambulatory Visit: Payer: Self-pay | Admitting: Family Medicine

## 2023-07-22 MED ORDER — IBUPROFEN 800 MG PO TABS: 800.0000 mg | ORAL_TABLET | Freq: Three times a day (TID) | ORAL | 1 refills | Status: AC | PRN

## 2023-08-01 NOTE — Progress Notes (Signed)
 [facility] [address] [place] [address] Phone: [phone] Subjective:   Jason Ortega, am serving as a scribe for Dr. Arthea Ortega.  I'm seeing this patient by the request  of:  Norleen Lynwood ORN, MD  CC: Right knee pain  [name]  06/11/2023 Continue to follow on patient.  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  We will need to continue to monitor.  Patient is able to augment some of his activities and exercises.  We discussed icing regimen and home exercises.  He will like brace given for contralateral knee.  Increase activity slowly.  Worsening pain consider injection.  Follow-up again in 2 to 3 months     Update 08/15/2023 Jason Ortega is a 58 y.o. male coming in with complaint of R knee pain. Patient states pain is about the same. Hamstring is about the same. Brace for the knee rather than injection. Hamstrings are the main issue now, painful all the time especially with sitting.      Past Medical History:  Diagnosis Date   Allergy    Anemia    Arthritis    Cataract    bilateral cataracts removed   Depression    Diabetes mellitus    Frequent headaches    GERD (gastroesophageal reflux disease)    Glaucoma    Hypercholesteremia    Hypertension    Migraines    Sleep apnea    wears c-PAP   Past Surgical History:  Procedure Laterality Date   bilateral cataracts removed     CARDIAC CATHETERIZATION  12/2011   COLONOSCOPY     LEFT HEART CATHETERIZATION WITH CORONARY ANGIOGRAM N/A 11/12/2011   Procedure: LEFT HEART CATHETERIZATION WITH CORONARY ANGIOGRAM;  Surgeon: Erick JONELLE Bergamo, MD;  Location: [facility] CATH LAB;  Service: Cardiovascular;  Laterality: N/A;   SHOULDER SURGERY     Social History   Socioeconomic History   Marital status: Single    Spouse name: Not on file   Number of children: 2   Years of education: 63   Highest education level: 12th grade  Occupational History   Occupation:  Nature conservation officer  Tobacco Use   Smoking status: Never   Smokeless tobacco: Never  Vaping Use   Vaping status: Never Used  Substance and Sexual Activity   Alcohol use: Yes    Comment: occasional   Drug use: No   Sexual activity: Yes    Birth control/protection: None  Other Topics Concern   Not on file  Social History Narrative   Fun/Hobby: Coach football   Social Drivers of Health   Financial Resource Strain: Medium Risk (03/18/2023)   Overall Financial Resource Strain (CARDIA)    Difficulty of Paying Living Expenses: Somewhat hard  Food Insecurity: Food Insecurity Present (03/18/2023)   Hunger Vital Sign    Worried About Running Out of Food in the Last Year: Sometimes true    Ran Out of Food in the Last Year: Sometimes true  Transportation Needs: No Transportation Needs (03/18/2023)   PRAPARE - Administrator, Civil Service (Medical): No    Lack of Transportation (Non-Medical): No  Physical Activity: Sufficiently Active (03/18/2023)   Exercise Vital Sign    Days of Exercise per Week: 6 days    Minutes of Exercise per Session: 60 min  Stress: Stress Concern Present (03/18/2023)   [employer] of Occupational Health - Occupational Stress Questionnaire    Feeling of Stress :  To some extent  Social Connections: Moderately Isolated (03/18/2023)   Social Connection and Isolation Panel    Frequency of Communication with Friends and Family: More than three times a week    Frequency of Social Gatherings with Friends and Family: Once a week    Attends Religious Services: More than 4 times per year    Active Member of [employer] or Organizations: No    Attends Engineer, structural: Not on file    Marital Status: Divorced   Allergies  Allergen Reactions   Pollen Extract Other (See Comments)    Stuffy nose   Family History  Problem Relation Age of Onset   Arthritis Mother    Stroke Mother    Hypertension Father    Diabetes Father    Cancer Father        Multiple  Myleoma   Diabetes Maternal Grandfather    Colon cancer Neg Hx    Esophageal cancer Neg Hx    Rectal cancer Neg Hx    Stomach cancer Neg Hx    Sleep apnea Neg Hx     Current Outpatient Medications (Endocrine & Metabolic):    empagliflozin  (JARDIANCE ) 25 MG TABS tablet, Take 1 tablet (25 mg total) by mouth daily before breakfast.   metFORMIN  (GLUCOPHAGE ) 1000 MG tablet, Take 1 tablet (1,000 mg total) by mouth 2 (two) times daily with a meal.   Semaglutide , 2 MG/DOSE, (OZEMPIC , 2 MG/DOSE,) 8 MG/3ML SOPN, Inject 2 mg into the skin once a week.  Current Outpatient Medications (Cardiovascular):    atorvastatin  (LIPITOR) 40 MG tablet, TAKE 1 TABLET BY MOUTH EVERY DAY   losartan  (COZAAR ) 100 MG tablet, Take 1 tablet (100 mg total) by mouth daily.   propranolol  (INDERAL ) 20 MG tablet, Take 1 tablet (20 mg total) by mouth at bedtime.   sildenafil (REVATIO) 20 MG tablet, Take 20 mg by mouth. 3-5 tabs prn   verapamil  (CALAN -SR) 240 MG CR tablet, TAKE 1 TABLET BY MOUTH AT BEDTIME.  Current Outpatient Medications (Respiratory):    fluticasone  (FLONASE ) 50 MCG/ACT nasal spray, Place 2 sprays into both nostrils daily for 14 days.  Current Outpatient Medications (Analgesics):    aspirin  EC 325 MG tablet, Take 1 tablet (325 mg total) by mouth daily.   Galcanezumab -gnlm (EMGALITY ) 120 MG/ML SOAJ, Inject 120 mg into the skin every 30 (thirty) days.   ibuprofen  (ADVIL ) 800 MG tablet, Take 1 tablet (800 mg total) by mouth 3 (three) times daily as needed.   meloxicam  (MOBIC ) 15 MG tablet, TAKE 1 TABLET (15 MG TOTAL) BY MOUTH DAILY.   Rimegepant Sulfate (NURTEC) 75 MG TBDP, Take 1 tablet (75 mg total) by mouth as needed (Take 1 tablet at onset of migraine ( Do not exceed more than 1 tablet in 24 hours)).   rizatriptan  (MAXALT -MLT) 10 MG disintegrating tablet, TAKE 1 TABLET BY MOUTH AS NEEDED FOR MIGRAINE. MAY REPEAT IN 2 HOURS IF NEEDED   Current Outpatient Medications (Other):    gabapentin  (NEURONTIN )  100 MG capsule, Take 2 capsules (200 mg total) by mouth 2 (two) times daily.   Blood Glucose Monitoring Suppl (ONETOUCH VERIO) w/Device KIT, 1 Device by Does not apply route 2 (two) times daily. E11.9   Blood Pressure Monitoring (BLOOD PRESSURE DIGITAL SOLN) KIT,    chlorproMAZINE  (THORAZINE ) 25 MG tablet, Take 1 tablet (25 mg total) by mouth 3 (three) times daily as needed.   diclofenac  Sodium (VOLTAREN ) 1 % GEL, Apply 2 g topically 4 (four) times daily as  needed.   dicyclomine  (BENTYL ) 10 MG capsule, TAKE 1 CAPSULE (10 MG TOTAL) BY MOUTH 4 (FOUR) TIMES DAILY - BEFORE MEALS AND AT BEDTIME.   gabapentin  (NEURONTIN ) 300 MG capsule, TAKE 1 CAPSULE BY MOUTH EVERYDAY AT BEDTIME   glucose blood (ONETOUCH VERIO) test strip, 1 each by Other route 2 (two) times daily. E11.9   methocarbamol  (ROBAXIN ) 500 MG tablet, TAKE 1 TABLET BY MOUTH TWICE DAILY   OneTouch Delica Lancets 30G MISC, 1 each by Does not apply route 2 (two) times daily. E11.9   pantoprazole  (PROTONIX ) 40 MG tablet, TAKE 1 TABLET BY MOUTH TWICE A DAY   PENNSAID  2 % SOLN, SMARTSIG:2 Pump Topical Twice Daily   timolol (TIMOPTIC-XR) 0.5 % ophthalmic gel-forming, Place 1 drop into both eyes daily.    Reviewed prior external information including notes and imaging from  primary care provider As well as notes that were available from care everywhere and other healthcare systems.  Past medical history, social, surgical and family history all reviewed in electronic medical record.  No pertanent information unless stated regarding to the chief complaint.   Review of Systems:  No headache, visual changes, nausea, vomiting, diarrhea, constipation, dizziness, abdominal pain, skin rash, fevers, chills, night sweats, weight loss, swollen lymph nodes, body aches, joint swelling, chest pain, shortness of breath, mood changes. POSITIVE muscle aches  Objective  Blood pressure (!) 140/90, pulse 76, height 5' 8 (1.727 m), weight 216 lb (98 kg), SpO2  98%.   General: No apparent distress alert and oriented x3 mood and affect normal, dressed appropriately.  HEENT: Pupils equal, extraocular movements intact  Respiratory: Patient's speak in full sentences and does not appear short of breath  Cardiovascular: No lower extremity edema, non tender, no erythema  Right knee still mild lateral tracking of the patella but improved.  Patient does have a mild positive straight leg test.  Severely positive FABER test.  Severe pain over the piriformis.  Hamstring strength is 5 out of 5 and symmetric to the contralateral side.   [id number]; 15 additional minutes spent for Therapeutic exercises as stated in above notes.  This included exercises focusing on stretching, strengthening, with significant focus on eccentric aspects.   Long term goals include an improvement in range of motion, strength, endurance as well as avoiding reinjury. Patient's frequency would include in 1-2 times a day, 3-5 times a week for a duration of 6-12 weeks. Using Netter's Orthopaedic Anatomy, reviewed with the patient the structures involved and how they related to diagnosis. The patient indicated understanding.   The patient was given a handout about classic piriformis stretching including [facility], Modified [facility], my self-described Sink Stretch, and other piriformis rehab.  We also reviewed hip flexor and abductor strengthening, ham stretching  Rec deep massage, explained self-massage with ball   Proper technique shown and discussed handout in great detail with ATC.  All questions were discussed and answered.     Impression and Recommendations:      The above documentation has been reviewed and is accurate and complete Jason Ortega M Terika Pillard, DO

## 2023-08-06 ENCOUNTER — Other Ambulatory Visit: Payer: Self-pay | Admitting: Family Medicine

## 2023-08-06 ENCOUNTER — Other Ambulatory Visit: Payer: Self-pay | Admitting: *Deleted

## 2023-08-06 MED ORDER — PROPRANOLOL HCL 20 MG PO TABS: 20.0000 mg | ORAL_TABLET | Freq: Every day | ORAL | 2 refills | Status: AC

## 2023-08-06 NOTE — Telephone Encounter (Signed)
 Last seen 01/10/23 Follow up scheduled on 06/02/24

## 2023-08-15 ENCOUNTER — Ambulatory Visit: Admitting: Family Medicine

## 2023-08-15 ENCOUNTER — Encounter: Payer: Self-pay | Admitting: Family Medicine

## 2023-08-15 VITALS — BP 140/90 | HR 76 | Ht 68.0 in | Wt 216.0 lb

## 2023-08-15 DIAGNOSIS — M2242 Chondromalacia patellae, left knee: Secondary | ICD-10-CM | POA: Diagnosis not present

## 2023-08-15 DIAGNOSIS — M2241 Chondromalacia patellae, right knee: Secondary | ICD-10-CM

## 2023-08-15 DIAGNOSIS — G5701 Lesion of sciatic nerve, right lower limb: Secondary | ICD-10-CM

## 2023-08-15 MED ORDER — GABAPENTIN 100 MG PO CAPS: 200.0000 mg | ORAL_CAPSULE | Freq: Two times a day (BID) | ORAL | 0 refills | Status: DC

## 2023-08-15 NOTE — Patient Instructions (Addendum)
 You have 14 days to return or exchange your brace Call [phone], then return the brace to our office Continue the gabapentin  300mg  add 100mg  BID during the day Do prescribed exercises at least 3x a week See you again in 2 months

## 2023-08-15 NOTE — Assessment & Plan Note (Signed)
 Pain is transition to more of the piriformis.  Discussed icing regimen and home exercises.  I think patient seems to be significantly improved but more radicular symptoms at this time.  Discussed icing regimen and home exercises.  If worsening pain can consider injection.  Possible workup for lumbar radiculopathy would be needed.  Increase gabapentin  100 mg twice daily and 300 mg at night follow-up again in 2 months

## 2023-08-15 NOTE — Assessment & Plan Note (Signed)
 Needs new Tru pull lite brace today.  Discussed icing regimen and home exercises, discussed which activities to do and which ones to avoid.  Follow-up again in 6 to 8 weeks.

## 2023-08-20 ENCOUNTER — Other Ambulatory Visit: Payer: Self-pay

## 2023-08-20 MED ORDER — RIZATRIPTAN BENZOATE 10 MG PO TBDP: ORAL_TABLET | ORAL | 8 refills | Status: AC

## 2023-08-21 ENCOUNTER — Other Ambulatory Visit: Payer: Self-pay | Admitting: Family Medicine

## 2023-09-09 ENCOUNTER — Other Ambulatory Visit: Payer: Self-pay

## 2023-09-09 MED ORDER — NURTEC 75 MG PO TBDP: 1.0000 | ORAL_TABLET | ORAL | 9 refills | Status: AC | PRN

## 2023-09-10 ENCOUNTER — Other Ambulatory Visit: Payer: Self-pay | Admitting: Family Medicine

## 2023-09-11 ENCOUNTER — Other Ambulatory Visit: Payer: Self-pay

## 2023-09-11 ENCOUNTER — Other Ambulatory Visit: Payer: Self-pay | Admitting: *Deleted

## 2023-09-11 ENCOUNTER — Encounter: Payer: Self-pay | Admitting: Family Medicine

## 2023-09-11 MED ORDER — EMGALITY 120 MG/ML ~~LOC~~ SOAJ: 120.0000 mg | SUBCUTANEOUS | 3 refills | Status: AC

## 2023-09-11 NOTE — Telephone Encounter (Signed)
 Last seen on 01/10/23 Follow up scheduled on 06/03/23

## 2023-09-22 ENCOUNTER — Other Ambulatory Visit: Payer: Self-pay | Admitting: Family Medicine

## 2023-10-13 NOTE — Progress Notes (Unsigned)
 [facility] [address] [place] [address] Phone: [phone] Subjective:   Jason Ortega, am serving as a scribe for Dr. Arthea Claudene.  I'm seeing this patient by the request  of:  Norleen Lynwood ORN, MD  CC: Knee pain and back pain follow-up  [name]  Jason Ortega is a 58 y.o. male coming in with complaint of knee pain as well as back pain.  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  Patient also has a piriformis syndrome on the right side patient has been dealing with.  Patient states that his R hip/glute still bother him. Pain radiates down back of leg to the knee and then down side of lower leg.   Both knees are doing ok today.        Past Medical History:  Diagnosis Date   Allergy    Anemia    Arthritis    Cataract    bilateral cataracts removed   Depression    Diabetes mellitus    Frequent headaches    GERD (gastroesophageal reflux disease)    Glaucoma    Hypercholesteremia    Hypertension    Migraines    Sleep apnea    wears c-PAP   Past Surgical History:  Procedure Laterality Date   bilateral cataracts removed     CARDIAC CATHETERIZATION  12/2011   COLONOSCOPY     LEFT HEART CATHETERIZATION WITH CORONARY ANGIOGRAM N/A 11/12/2011   Procedure: LEFT HEART CATHETERIZATION WITH CORONARY ANGIOGRAM;  Surgeon: Erick JONELLE Bergamo, MD;  Location: [facility] CATH LAB;  Service: Cardiovascular;  Laterality: N/A;   SHOULDER SURGERY     Social History   Socioeconomic History   Marital status: Single    Spouse name: Not on file   Number of children: 2   Years of education: 33   Highest education level: 12th grade  Occupational History   Occupation: Nature conservation officer  Tobacco Use   Smoking status: Never   Smokeless tobacco: Never  Vaping Use   Vaping status: Never Used  Substance and Sexual Activity   Alcohol use: Yes    Comment: occasional   Drug use: No   Sexual activity:  Yes    Birth control/protection: None  Other Topics Concern   Not on file  Social History Narrative   Fun/Hobby: Coach football   Social Drivers of Health   Financial Resource Strain: Medium Risk (03/18/2023)   Overall Financial Resource Strain (CARDIA)    Difficulty of Paying Living Expenses: Somewhat hard  Food Insecurity: Food Insecurity Present (03/18/2023)   Hunger Vital Sign    Worried About Running Out of Food in the Last Year: Sometimes true    Ran Out of Food in the Last Year: Sometimes true  Transportation Needs: No Transportation Needs (03/18/2023)   PRAPARE - Administrator, Civil Service (Medical): No    Lack of Transportation (Non-Medical): No  Physical Activity: Sufficiently Active (03/18/2023)   Exercise Vital Sign    Days of Exercise per Week: 6 days    Minutes of Exercise per Session: 60 min  Stress: Stress Concern Present (03/18/2023)   [employer] of Occupational Health - Occupational Stress Questionnaire    Feeling of Stress : To some extent  Social Connections: Moderately Isolated (03/18/2023)   Social Connection and Isolation Panel    Frequency of Communication with Friends and Family: More than three times a week    Frequency of Social Gatherings  with Friends and Family: Once a week    Attends Religious Services: More than 4 times per year    Active Member of Clubs or Organizations: No    Attends Engineer, structural: Not on file    Marital Status: Divorced   Allergies  Allergen Reactions   Pollen Extract Other (See Comments)    Stuffy nose   Family History  Problem Relation Age of Onset   Arthritis Mother    Stroke Mother    Hypertension Father    Diabetes Father    Cancer Father        Multiple Myleoma   Diabetes Maternal Grandfather    Colon cancer Neg Hx    Esophageal cancer Neg Hx    Rectal cancer Neg Hx    Stomach cancer Neg Hx    Sleep apnea Neg Hx     Current Outpatient Medications (Endocrine & Metabolic):     empagliflozin  (JARDIANCE ) 25 MG TABS tablet, Take 1 tablet (25 mg total) by mouth daily before breakfast.   metFORMIN  (GLUCOPHAGE ) 1000 MG tablet, Take 1 tablet (1,000 mg total) by mouth 2 (two) times daily with a meal.   Semaglutide , 2 MG/DOSE, (OZEMPIC , 2 MG/DOSE,) 8 MG/3ML SOPN, Inject 2 mg into the skin once a week.  Current Outpatient Medications (Cardiovascular):    atorvastatin  (LIPITOR) 40 MG tablet, TAKE 1 TABLET BY MOUTH EVERY DAY   losartan  (COZAAR ) 100 MG tablet, Take 1 tablet (100 mg total) by mouth daily.   propranolol  (INDERAL ) 20 MG tablet, Take 1 tablet (20 mg total) by mouth at bedtime.   sildenafil (REVATIO) 20 MG tablet, Take 20 mg by mouth. 3-5 tabs prn   verapamil  (CALAN -SR) 240 MG CR tablet, TAKE 1 TABLET BY MOUTH AT BEDTIME.  Current Outpatient Medications (Respiratory):    fluticasone  (FLONASE ) 50 MCG/ACT nasal spray, Place 2 sprays into both nostrils daily for 14 days.  Current Outpatient Medications (Analgesics):    aspirin  EC 325 MG tablet, Take 1 tablet (325 mg total) by mouth daily.   Galcanezumab -gnlm (EMGALITY ) 120 MG/ML SOAJ, Inject 120 mg into the skin every 30 (thirty) days.   ibuprofen  (ADVIL ) 800 MG tablet, Take 1 tablet (800 mg total) by mouth 3 (three) times daily as needed.   meloxicam  (MOBIC ) 15 MG tablet, TAKE 1 TABLET (15 MG TOTAL) BY MOUTH DAILY.   Rimegepant Sulfate (NURTEC) 75 MG TBDP, Take 1 tablet (75 mg total) by mouth as needed (Take 1 tablet at onset of migraine ( Do not exceed more than 1 tablet in 24 hours)).   rizatriptan  (MAXALT -MLT) 10 MG disintegrating tablet, TAKE 1 TABLET BY MOUTH AS NEEDED FOR MIGRAINE. MAY REPEAT IN 2 HOURS IF NEEDED   Current Outpatient Medications (Other):    Blood Glucose Monitoring Suppl (ONETOUCH VERIO) w/Device KIT, 1 Device by Does not apply route 2 (two) times daily. E11.9   Blood Pressure Monitoring (BLOOD PRESSURE DIGITAL SOLN) KIT,    chlorproMAZINE  (THORAZINE ) 25 MG tablet, Take 1 tablet (25 mg total)  by mouth 3 (three) times daily as needed.   diclofenac  Sodium (VOLTAREN ) 1 % GEL, Apply 2 g topically 4 (four) times daily as needed.   dicyclomine  (BENTYL ) 10 MG capsule, TAKE 1 CAPSULE (10 MG TOTAL) BY MOUTH 4 (FOUR) TIMES DAILY - BEFORE MEALS AND AT BEDTIME.   gabapentin  (NEURONTIN ) 100 MG capsule, Take 2 capsules (200 mg total) by mouth 2 (two) times daily.   gabapentin  (NEURONTIN ) 300 MG capsule, TAKE 1 CAPSULE BY MOUTH EVERYDAY  AT BEDTIME   glucose blood (ONETOUCH VERIO) test strip, 1 each by Other route 2 (two) times daily. E11.9   methocarbamol  (ROBAXIN ) 500 MG tablet, TAKE 1 TABLET BY MOUTH TWICE DAILY   OneTouch Delica Lancets 30G MISC, 1 each by Does not apply route 2 (two) times daily. E11.9   pantoprazole  (PROTONIX ) 40 MG tablet, TAKE 1 TABLET BY MOUTH TWICE A DAY   PENNSAID  2 % SOLN, SMARTSIG:2 Pump Topical Twice Daily   timolol (TIMOPTIC-XR) 0.5 % ophthalmic gel-forming, Place 1 drop into both eyes daily.    Reviewed prior external information including notes and imaging from  primary care provider As well as notes that were available from care everywhere and other healthcare systems.  Past medical history, social, surgical and family history all reviewed in electronic medical record.  No pertanent information unless stated regarding to the chief complaint.   Review of Systems:  No headache, visual changes, nausea, vomiting, diarrhea, constipation, dizziness, abdominal pain, skin rash, fevers, chills, night sweats, weight loss, swollen lymph nodes, body aches, joint swelling, chest pain, shortness of breath, mood changes. POSITIVE muscle aches  Objective  Blood pressure 130/86, pulse 70, height 5' 8 (1.727 m), weight 221 lb (100.2 kg), SpO2 97%.   General: No apparent distress alert and oriented x3 mood and affect normal, dressed appropriately.  HEENT: Pupils equal, extraocular movements intact  Respiratory: Patient's speak in full sentences and does not appear short of  breath  Cardiovascular: No lower extremity edema, non tender, no erythema  Back exam shows does have some very mild loss of lordosis.  Positive FABER test noted.  Negative straight leg test noted.  Knee exam shows no significant swelling at this time.  Minimal tenderness over the medial joint line bilaterally.  No significant effusion noted.  Full range of motion.    Impression and Recommendations:     The above documentation has been reviewed and is accurate and complete Jullien Granquist M Burnice Vassel, DO

## 2023-10-15 ENCOUNTER — Ambulatory Visit: Admitting: Endocrinology

## 2023-10-15 ENCOUNTER — Ambulatory Visit (INDEPENDENT_AMBULATORY_CARE_PROVIDER_SITE_OTHER): Admitting: Family Medicine

## 2023-10-15 ENCOUNTER — Telehealth: Payer: Self-pay | Admitting: Neurology

## 2023-10-15 ENCOUNTER — Ambulatory Visit (INDEPENDENT_AMBULATORY_CARE_PROVIDER_SITE_OTHER)

## 2023-10-15 VITALS — BP 130/86 | HR 70 | Ht 68.0 in | Wt 221.0 lb

## 2023-10-15 DIAGNOSIS — G5701 Lesion of sciatic nerve, right lower limb: Secondary | ICD-10-CM | POA: Diagnosis not present

## 2023-10-15 DIAGNOSIS — M545 Low back pain, unspecified: Secondary | ICD-10-CM

## 2023-10-15 DIAGNOSIS — M25551 Pain in right hip: Secondary | ICD-10-CM

## 2023-10-15 MED ORDER — GABAPENTIN 100 MG PO CAPS: 200.0000 mg | ORAL_CAPSULE | Freq: Two times a day (BID) | ORAL | 0 refills | Status: DC

## 2023-10-15 NOTE — Assessment & Plan Note (Signed)
\  Will get x-rays at this time, discussed icing regimen and home exercises, discussed which activities to do and which ones to avoid.  Will increase gabapentin  to 200 mg twice daily with 300 mg at night.  Increase activity slowly.  Discussed icing regimen.  Follow-up with me again 2 months

## 2023-10-15 NOTE — Patient Instructions (Signed)
 Xray today Gabapentin  200mg  2x a day, 300mg  at night Stretch piriformis Hip abductor strength See me in 2 months

## 2023-10-15 NOTE — Telephone Encounter (Signed)
 Patient said have some CPAP supplies from old machine ([employer]) and company keep sending them. These supplies have not been touched. Would like to give to someone that needs supplies. Would like a call back

## 2023-10-16 ENCOUNTER — Encounter: Payer: Self-pay | Admitting: Endocrinology

## 2023-10-16 ENCOUNTER — Ambulatory Visit: Payer: Self-pay | Admitting: Endocrinology

## 2023-10-16 ENCOUNTER — Ambulatory Visit: Admitting: Endocrinology

## 2023-10-16 VITALS — BP 132/90 | HR 91 | Resp 16 | Ht 68.0 in | Wt 221.8 lb

## 2023-10-16 DIAGNOSIS — Z7984 Long term (current) use of oral hypoglycemic drugs: Secondary | ICD-10-CM

## 2023-10-16 DIAGNOSIS — Z7985 Long-term (current) use of injectable non-insulin antidiabetic drugs: Secondary | ICD-10-CM | POA: Diagnosis not present

## 2023-10-16 DIAGNOSIS — E1165 Type 2 diabetes mellitus with hyperglycemia: Secondary | ICD-10-CM | POA: Diagnosis not present

## 2023-10-16 LAB — POCT GLYCOSYLATED HEMOGLOBIN (HGB A1C): Hemoglobin A1C: 6.4 % — AB (ref 4.0–5.6)

## 2023-10-16 NOTE — Progress Notes (Signed)
 Outpatient Endocrinology Note Jason Davanta Meuser, MD  10/16/23  Patient's Name: Jason Ortega    DOB: 05-06-1965    MRN: [id number]                                                    REASON OF VISIT:  Follow up for type 2 diabetes mellitus  PCP: Norleen Lynwood ORN, MD  HISTORY OF PRESENT ILLNESS:   Jason Ortega is a 58 y.o. old male with past medical history listed below, is here for follow up for type 2 diabetes mellitus.    Pertinent Diabetes History: Patient was previously seen by Dr. Von and was last time seen in April 2024.  Patient was diagnosed with type 2 diabetes mellitus in 1999.  He has controlled type 2 diabetes mellitus.  Chronic Diabetes Complications : Retinopathy: no. Last ophthalmology exam was done on annually, following with ophthalmology regularly.  Nephropathy: no, on ACE/ARB / losartan , farxiga  Peripheral neuropathy: no Coronary artery disease: no Stroke: no  Relevant comorbidities and cardiovascular risk factors: Obesity: yes Body mass index is 33.72 kg/m.  Hypertension: Yes  Hyperlipidemia : Yes, on statin   Current / Home Diabetic regimen includes:  Non-insulin  hypoglycemic drugs: Metformin  1000 mg 2 times a day, Ozempic  2 mg weekly, Farxiga  10 mg daily   Prior diabetic medications: Ozempic  was started in 2020.  He had to use insulin  in 2020 for a few months.  He had used Amaryl , Byetta  and Prandin  in the past.  Glycemic data:    Recent readings : 150, 91, 141, 178, 118, 96.  Patient's glucose log reviewed.  Hypoglycemia: Patient has ? hypoglycemic episodes. Patient has hypoglycemia awareness.  Factors modifying glucose control: 1.  Diabetic diet assessment: 3 meals a day.  2.  Staying active or exercising: Walks about 10 to 12 miles per day, he works for [employer].  3.  Medication compliance: compliant all of the time.  Interval history  Hemoglobin A1c 6.4%.  Diabetes regimen as reviewed and noted above.  Occasional fasting hyperglycemia probably  related to late supper.  Denies numbness and tingling of the feet.  No vision problem.  No other complaints today.  REVIEW OF SYSTEMS As per history of present illness.   PAST MEDICAL HISTORY: Past Medical History:  Diagnosis Date   Allergy    Anemia    Arthritis    Cataract    bilateral cataracts removed   Depression    Diabetes mellitus    Frequent headaches    GERD (gastroesophageal reflux disease)    Glaucoma    Hypercholesteremia    Hypertension    Migraines    Sleep apnea    wears c-PAP    PAST SURGICAL HISTORY: Past Surgical History:  Procedure Laterality Date   bilateral cataracts removed     CARDIAC CATHETERIZATION  12/2011   COLONOSCOPY     LEFT HEART CATHETERIZATION WITH CORONARY ANGIOGRAM N/A 11/12/2011   Procedure: LEFT HEART CATHETERIZATION WITH CORONARY ANGIOGRAM;  Surgeon: Erick JONELLE Bergamo, MD;  Location: [facility] CATH LAB;  Service: Cardiovascular;  Laterality: N/A;   SHOULDER SURGERY      ALLERGIES: Allergies  Allergen Reactions   Pollen Extract Other (See Comments)    Stuffy nose    FAMILY HISTORY:  Family History  Problem Relation Age of Onset   Arthritis Mother  Stroke Mother    Hypertension Father    Diabetes Father    Cancer Father        Multiple Myleoma   Diabetes Maternal Grandfather    Colon cancer Neg Hx    Esophageal cancer Neg Hx    Rectal cancer Neg Hx    Stomach cancer Neg Hx    Sleep apnea Neg Hx     SOCIAL HISTORY: Social History   Socioeconomic History   Marital status: Single    Spouse name: Not on file   Number of children: 2   Years of education: 13   Highest education level: 12th grade  Occupational History   Occupation: Nature conservation officer  Tobacco Use   Smoking status: Never   Smokeless tobacco: Never  Vaping Use   Vaping status: Never Used  Substance and Sexual Activity   Alcohol use: Yes    Comment: occasional   Drug use: No   Sexual activity: Yes    Birth control/protection: None  Other Topics Concern   Not  on file  Social History Narrative   Fun/Hobby: Psychologist, occupational football   Social Drivers of Health   Financial Resource Strain: Medium Risk (03/18/2023)   Overall Financial Resource Strain (CARDIA)    Difficulty of Paying Living Expenses: Somewhat hard  Food Insecurity: Food Insecurity Present (03/18/2023)   Hunger Vital Sign    Worried About Running Out of Food in the Last Year: Sometimes true    Ran Out of Food in the Last Year: Sometimes true  Transportation Needs: No Transportation Needs (03/18/2023)   PRAPARE - Administrator, Civil Service (Medical): No    Lack of Transportation (Non-Medical): No  Physical Activity: Sufficiently Active (03/18/2023)   Exercise Vital Sign    Days of Exercise per Week: 6 days    Minutes of Exercise per Session: 60 min  Stress: Stress Concern Present (03/18/2023)   [employer] of Occupational Health - Occupational Stress Questionnaire    Feeling of Stress : To some extent  Social Connections: Moderately Isolated (03/18/2023)   Social Connection and Isolation Panel    Frequency of Communication with Friends and Family: More than three times a week    Frequency of Social Gatherings with Friends and Family: Once a week    Attends Religious Services: More than 4 times per year    Active Member of [employer] or Organizations: No    Attends Engineer, structural: Not on file    Marital Status: Divorced    MEDICATIONS:  Current Outpatient Medications  Medication Sig Dispense Refill   aspirin  EC 325 MG tablet Take 1 tablet (325 mg total) by mouth daily. 30 tablet 99   atorvastatin  (LIPITOR) 40 MG tablet TAKE 1 TABLET BY MOUTH EVERY DAY 90 tablet 3   Blood Glucose Monitoring Suppl (ONETOUCH VERIO) w/Device KIT 1 Device by Does not apply route 2 (two) times daily. E11.9 1 kit 0   Blood Pressure Monitoring (BLOOD PRESSURE DIGITAL SOLN) KIT      chlorproMAZINE  (THORAZINE ) 25 MG tablet Take 1 tablet (25 mg total) by mouth 3 (three) times daily as  needed. 30 tablet 0   diclofenac  Sodium (VOLTAREN ) 1 % GEL Apply 2 g topically 4 (four) times daily as needed. 150 g 1   dicyclomine  (BENTYL ) 10 MG capsule TAKE 1 CAPSULE (10 MG TOTAL) BY MOUTH 4 (FOUR) TIMES DAILY - BEFORE MEALS AND AT BEDTIME. 90 capsule 3   empagliflozin  (JARDIANCE ) 25 MG TABS tablet Take 1 tablet (  25 mg total) by mouth daily before breakfast. 90 tablet 3   fluticasone  (FLONASE ) 50 MCG/ACT nasal spray Place 2 sprays into both nostrils daily for 14 days. 9.9 mL 2   gabapentin  (NEURONTIN ) 100 MG capsule Take 2 capsules (200 mg total) by mouth 2 (two) times daily. 360 capsule 0   gabapentin  (NEURONTIN ) 300 MG capsule TAKE 1 CAPSULE BY MOUTH EVERYDAY AT BEDTIME 30 capsule 0   Galcanezumab -gnlm (EMGALITY ) 120 MG/ML SOAJ Inject 120 mg into the skin every 30 (thirty) days. 3 mL 3   glucose blood (ONETOUCH VERIO) test strip 1 each by Other route 2 (two) times daily. E11.9 100 each 3   ibuprofen  (ADVIL ) 800 MG tablet Take 1 tablet (800 mg total) by mouth 3 (three) times daily as needed. 90 tablet 1   losartan  (COZAAR ) 100 MG tablet Take 1 tablet (100 mg total) by mouth daily. 90 tablet 3   meloxicam  (MOBIC ) 15 MG tablet TAKE 1 TABLET (15 MG TOTAL) BY MOUTH DAILY. 30 tablet 0   metFORMIN  (GLUCOPHAGE ) 1000 MG tablet Take 1 tablet (1,000 mg total) by mouth 2 (two) times daily with a meal. 180 tablet 3   methocarbamol  (ROBAXIN ) 500 MG tablet TAKE 1 TABLET BY MOUTH TWICE DAILY 20 tablet 0   OneTouch Delica Lancets 30G MISC 1 each by Does not apply route 2 (two) times daily. E11.9     pantoprazole  (PROTONIX ) 40 MG tablet TAKE 1 TABLET BY MOUTH TWICE A DAY 180 tablet 3   PENNSAID  2 % SOLN SMARTSIG:2 Pump Topical Twice Daily     propranolol  (INDERAL ) 20 MG tablet Take 1 tablet (20 mg total) by mouth at bedtime. 90 tablet 2   Rimegepant Sulfate (NURTEC) 75 MG TBDP Take 1 tablet (75 mg total) by mouth as needed (Take 1 tablet at onset of migraine ( Do not exceed more than 1 tablet in 24 hours)). 8  tablet 9   rizatriptan  (MAXALT -MLT) 10 MG disintegrating tablet TAKE 1 TABLET BY MOUTH AS NEEDED FOR MIGRAINE. MAY REPEAT IN 2 HOURS IF NEEDED 9 tablet 8   Semaglutide , 2 MG/DOSE, (OZEMPIC , 2 MG/DOSE,) 8 MG/3ML SOPN Inject 2 mg into the skin once a week. 9 mL 3   sildenafil (REVATIO) 20 MG tablet Take 20 mg by mouth. 3-5 tabs prn     timolol (TIMOPTIC-XR) 0.5 % ophthalmic gel-forming Place 1 drop into both eyes daily.      verapamil  (CALAN -SR) 240 MG CR tablet TAKE 1 TABLET BY MOUTH AT BEDTIME. 90 tablet 3   No current facility-administered medications for this visit.    PHYSICAL EXAM: Vitals:   10/16/23 0814 10/16/23 0815  BP: (!) 122/94 (!) 132/90  Pulse: 91   Resp: 16   SpO2: 96%   Weight: 221 lb 12.8 oz (100.6 kg)   Height: 5' 8 (1.727 m)      Body mass index is 33.72 kg/m.  Wt Readings from Last 3 Encounters:  10/16/23 221 lb 12.8 oz (100.6 kg)  10/15/23 221 lb (100.2 kg)  08/15/23 216 lb (98 kg)    General: Well developed, well nourished male in no apparent distress.  HEENT: AT/[place], no external lesions.  Eyes: Conjunctiva clear and no icterus. Neck: Neck supple  Lungs: Respirations not labored Neurologic: Alert, oriented, normal speech Extremities / Skin: Dry.  Psychiatric: Does not appear depressed or anxious  Diabetic Foot Exam - Simple   Simple Foot Form Diabetic Foot exam was performed with the following findings: Yes 10/16/2023  8:33 AM  Visual Inspection No deformities, no ulcerations, no other skin breakdown bilaterally: Yes Sensation Testing Intact to touch and monofilament testing bilaterally: Yes Pulse Check Posterior Tibialis and Dorsalis pulse intact bilaterally: Yes Comments    LABS Reviewed Lab Results  Component Value Date   HGBA1C 6.4 (A) 10/16/2023   HGBA1C 6.2 (A) 05/15/2023   HGBA1C 6.3 (A) 01/14/2023   No results found for: FRUCTOSAMINE Lab Results  Component Value Date   CHOL 133 10/23/2022   HDL 59.40 10/23/2022   LDLCALC 57  10/23/2022   TRIG 84.0 10/23/2022   CHOLHDL 2 10/23/2022   Lab Results  Component Value Date   MICRALBCREAT 37 (H) 09/01/2019   Lab Results  Component Value Date   CREATININE 1.39 01/07/2023   Lab Results  Component Value Date   GFR 55.60 (L) 01/07/2023    ASSESSMENT / PLAN  1. Type 2 diabetes mellitus with hyperglycemia, without long-term current use of insulin  (HCC)     Diabetes Mellitus type 2, complicated by no other known complication. - Diabetic status / severity: Controlled.  Lab Results  Component Value Date   HGBA1C 6.4 (A) 10/16/2023    - Hemoglobin A1c goal : <6.5%  Advised to limit carb for supper, avoid late supper eating and avoid bedtime snack.  - Medications: No change.  I) Ozempic  2 mg weekly. II) Farxiga  10 mg daily. III) metformin  1000 mg 2 times a day.  - Home glucose testing: Advised to check blood sugar at least few times a week at different times of the week and also when having symptoms of lightheadedness or weakness.    - Discussed/ Gave Hypoglycemia treatment plan.  # Consult : not required at this time.   # Annual urine for microalbuminuria/ creatinine ratio, no microalbuminuria currently, continue ACE/ARB losartan .  Will check today. Last  Lab Results  Component Value Date   MICRALBCREAT 37 (H) 09/01/2019    # Foot check nightly.  # Annual dilated diabetic eye exams.   - Diet: Make healthy diabetic food choices - Life style / activity / exercise: Discussed.  2. Blood pressure  -  BP Readings from Last 1 Encounters:  10/16/23 (!) 132/90    - Control is in target.  - No change in current plans.  3. Lipid status / Hyperlipidemia - Last  Lab Results  Component Value Date   LDLCALC 57 10/23/2022   - Continue atorvastatin  40 mg daily.  Managed by PCP.  Diagnoses and all orders for this visit:  Type 2 diabetes mellitus with hyperglycemia, without long-term current use of insulin  (HCC) -     POCT glycosylated  hemoglobin (Hb A1C) -     Basic metabolic panel with GFR -     Microalbumin / creatinine urine ratio -     Lipid panel      DISPOSITION Follow up in clinic in 5 months suggested.  Labs today as ordered.   All questions answered and patient verbalized understanding of the plan.  Jason Laquanta Hummel, MD [facility] [address] [place], [address] [place], [place] [address] Phone # [phone]  At least part of this note was generated using voice recognition software. Inadvertent word errors may have occurred, which were not recognized during the proofreading process.

## 2023-10-17 LAB — LIPID PANEL
Cholesterol: 147 mg/dL (ref <200)
HDL: 66 mg/dL (ref >=40)
LDL Cholesterol (Calc): 68 mg/dL
Non-HDL Cholesterol (Calc): 81 mg/dL (ref <130)
Total CHOL/HDL Ratio: 2.2 (calc) (ref <5.0)
Triglycerides: 52 mg/dL (ref <150)

## 2023-10-17 LAB — BASIC METABOLIC PANEL WITH GFR
BUN: 17 mg/dL (ref 7–25)
CO2: 30 mmol/L (ref 20–32)
Calcium: 9.4 mg/dL (ref 8.6–10.3)
Chloride: 103 mmol/L (ref 98–110)
Creat: 1.24 mg/dL (ref 0.70–1.35)
Glucose, Bld: 117 mg/dL — ABNORMAL HIGH (ref 65–99)
Potassium: 4.7 mmol/L (ref 3.5–5.3)
Sodium: 139 mmol/L (ref 135–146)
eGFR: 67 mL/min/1.73m2 (ref >=60)

## 2023-10-17 LAB — MICROALBUMIN / CREATININE URINE RATIO
Creatinine, Urine: 46 mg/dL (ref 20–320)
Microalb Creat Ratio: 41 mg/g{creat} — ABNORMAL HIGH (ref <30)
Microalb, Ur: 1.9 mg/dL

## 2023-10-21 ENCOUNTER — Other Ambulatory Visit: Payer: Self-pay | Admitting: Family Medicine

## 2023-10-22 ENCOUNTER — Ambulatory Visit: Payer: Self-pay | Admitting: Family Medicine

## 2023-11-17 ENCOUNTER — Other Ambulatory Visit: Payer: Self-pay | Admitting: Family Medicine

## 2023-11-20 ENCOUNTER — Other Ambulatory Visit: Payer: Self-pay | Admitting: Internal Medicine

## 2023-11-26 ENCOUNTER — Telehealth: Payer: Self-pay

## 2023-11-26 ENCOUNTER — Encounter: Payer: Self-pay | Admitting: Hematology and Oncology

## 2023-11-26 ENCOUNTER — Other Ambulatory Visit (HOSPITAL_COMMUNITY): Payer: Self-pay

## 2023-11-26 NOTE — Telephone Encounter (Signed)
 Pharmacy Patient Advocate Encounter  Received notification from [facility] that Prior Authorization for Emgality  has been APPROVED from 11/26/2023 to 11/25/2024   PA #/Case ID/Reference #: [id number]    [facility]

## 2023-12-09 ENCOUNTER — Encounter: Payer: Self-pay | Admitting: Family Medicine

## 2023-12-18 NOTE — Progress Notes (Signed)
 [facility] [address] [place] [address] Phone: [phone] Subjective:   [name], am serving as a scribe for Dr. Arthea Claudene.  I'm seeing this patient by the request  of:  Norleen Lynwood ORN, MD  CC: rught hip and bilateral knee pain   [name]  10/15/2023 \\Will  get x-rays at this time, discussed icing regimen and home exercises, discussed which activities to do and which ones to avoid.  Will increase gabapentin  to 200 mg twice daily with 300 mg at night.  Increase activity slowly.  Discussed icing regimen.  Follow-up with me again 2 months   Update 12/19/2023 Jahree Dermody is a 58 y.o. male coming in with complaint of R hip and B knee pain. Patient states that he has been doing well since last visit.   Xray R hip 10/15/2023 IMPRESSION: 1. Rounded lucency in the right femoral head neck junction compatible with synovial herniation pit.  Xray lumbar spine 10/15/2023 IMPRESSION: 1. Mild disc space narrowing at L3-4 and L4-5 with associated minimal endplate remodeling, consistent with mild degenerative disc disease. Stable since prior examination    Past Medical History:  Diagnosis Date   Allergy    Anemia    Arthritis    Cataract    bilateral cataracts removed   Depression    Diabetes mellitus    Frequent headaches    GERD (gastroesophageal reflux disease)    Glaucoma    Hypercholesteremia    Hypertension    Migraines    Sleep apnea    wears c-PAP   Past Surgical History:  Procedure Laterality Date   bilateral cataracts removed     CARDIAC CATHETERIZATION  12/2011   COLONOSCOPY     LEFT HEART CATHETERIZATION WITH CORONARY ANGIOGRAM N/A 11/12/2011   Procedure: LEFT HEART CATHETERIZATION WITH CORONARY ANGIOGRAM;  Surgeon: Erick JONELLE Bergamo, MD;  Location: [facility] CATH LAB;  Service: Cardiovascular;  Laterality: N/A;   SHOULDER SURGERY     Social History   Socioeconomic History   Marital status: Single    Spouse name: Not  on file   Number of children: 2   Years of education: 98   Highest education level: 12th grade  Occupational History   Occupation: Nature Conservation Officer  Tobacco Use   Smoking status: Never   Smokeless tobacco: Never  Vaping Use   Vaping status: Never Used  Substance and Sexual Activity   Alcohol use: Yes    Comment: occasional   Drug use: No   Sexual activity: Yes    Birth control/protection: None  Other Topics Concern   Not on file  Social History Narrative   Fun/Hobby: Coach football   Social Drivers of Health   Financial Resource Strain: Medium Risk (03/18/2023)   Overall Financial Resource Strain (CARDIA)    Difficulty of Paying Living Expenses: Somewhat hard  Food Insecurity: Food Insecurity Present (03/18/2023)   Hunger Vital Sign    Worried About Running Out of Food in the Last Year: Sometimes true    Ran Out of Food in the Last Year: Sometimes true  Transportation Needs: No Transportation Needs (03/18/2023)   PRAPARE - Administrator, Civil Service (Medical): No    Lack of Transportation (Non-Medical): No  Physical Activity: Sufficiently Active (03/18/2023)   Exercise Vital Sign    Days of Exercise per Week: 6 days    Minutes of Exercise per Session: 60 min  Stress: Stress Concern Present (03/18/2023)   [employer] of Occupational  Health - Occupational Stress Questionnaire    Feeling of Stress : To some extent  Social Connections: Moderately Isolated (03/18/2023)   Social Connection and Isolation Panel    Frequency of Communication with Friends and Family: More than three times a week    Frequency of Social Gatherings with Friends and Family: Once a week    Attends Religious Services: More than 4 times per year    Active Member of [employer] or Organizations: No    Attends Engineer, Structural: Not on file    Marital Status: Divorced   Allergies  Allergen Reactions   Pollen Extract Other (See Comments)    Stuffy nose   Family History  Problem  Relation Age of Onset   Arthritis Mother    Stroke Mother    Hypertension Father    Diabetes Father    Cancer Father        Multiple Myleoma   Diabetes Maternal Grandfather    Colon cancer Neg Hx    Esophageal cancer Neg Hx    Rectal cancer Neg Hx    Stomach cancer Neg Hx    Sleep apnea Neg Hx     Current Outpatient Medications (Endocrine & Metabolic):    empagliflozin  (JARDIANCE ) 25 MG TABS tablet, Take 1 tablet (25 mg total) by mouth daily before breakfast.   metFORMIN  (GLUCOPHAGE ) 1000 MG tablet, Take 1 tablet (1,000 mg total) by mouth 2 (two) times daily with a meal.   Semaglutide , 2 MG/DOSE, (OZEMPIC , 2 MG/DOSE,) 8 MG/3ML SOPN, Inject 2 mg into the skin once a week.  Current Outpatient Medications (Cardiovascular):    atorvastatin  (LIPITOR) 40 MG tablet, TAKE 1 TABLET BY MOUTH EVERY DAY   losartan  (COZAAR ) 100 MG tablet, Take 1 tablet (100 mg total) by mouth daily.   propranolol  (INDERAL ) 20 MG tablet, Take 1 tablet (20 mg total) by mouth at bedtime.   sildenafil (REVATIO) 20 MG tablet, Take 20 mg by mouth. 3-5 tabs prn   verapamil  (CALAN -SR) 240 MG CR tablet, TAKE 1 TABLET BY MOUTH AT BEDTIME.  Current Outpatient Medications (Respiratory):    fluticasone  (FLONASE ) 50 MCG/ACT nasal spray, Place 2 sprays into both nostrils daily for 14 days.  Current Outpatient Medications (Analgesics):    aspirin  EC 325 MG tablet, Take 1 tablet (325 mg total) by mouth daily.   Galcanezumab -gnlm (EMGALITY ) 120 MG/ML SOAJ, Inject 120 mg into the skin every 30 (thirty) days.   ibuprofen  (ADVIL ) 800 MG tablet, Take 1 tablet (800 mg total) by mouth 3 (three) times daily as needed.   meloxicam  (MOBIC ) 15 MG tablet, TAKE 1 TABLET (15 MG TOTAL) BY MOUTH DAILY.   Rimegepant Sulfate (NURTEC) 75 MG TBDP, Take 1 tablet (75 mg total) by mouth as needed (Take 1 tablet at onset of migraine ( Do not exceed more than 1 tablet in 24 hours)).   rizatriptan  (MAXALT -MLT) 10 MG disintegrating tablet, TAKE 1 TABLET  BY MOUTH AS NEEDED FOR MIGRAINE. MAY REPEAT IN 2 HOURS IF NEEDED   Current Outpatient Medications (Other):    Blood Glucose Monitoring Suppl (ONETOUCH VERIO) w/Device KIT, 1 Device by Does not apply route 2 (two) times daily. E11.9   Blood Pressure Monitoring (BLOOD PRESSURE DIGITAL SOLN) KIT,    chlorproMAZINE  (THORAZINE ) 25 MG tablet, Take 1 tablet (25 mg total) by mouth 3 (three) times daily as needed.   diclofenac  Sodium (VOLTAREN ) 1 % GEL, Apply 2 g topically 4 (four) times daily as needed.   dicyclomine  (BENTYL ) 10 MG  capsule, TAKE 1 CAPSULE (10 MG TOTAL) BY MOUTH 4 (FOUR) TIMES DAILY - BEFORE MEALS AND AT BEDTIME.   gabapentin  (NEURONTIN ) 300 MG capsule, Take 1 capsule (300 mg total) by mouth 3 (three) times daily.   glucose blood (ONETOUCH VERIO) test strip, 1 each by Other route 2 (two) times daily. E11.9   methocarbamol  (ROBAXIN ) 500 MG tablet, TAKE 1 TABLET BY MOUTH TWICE DAILY   OneTouch Delica Lancets 30G MISC, 1 each by Does not apply route 2 (two) times daily. E11.9   pantoprazole  (PROTONIX ) 40 MG tablet, TAKE 1 TABLET BY MOUTH TWICE A DAY   PENNSAID  2 % SOLN, SMARTSIG:2 Pump Topical Twice Daily   timolol (TIMOPTIC-XR) 0.5 % ophthalmic gel-forming, Place 1 drop into both eyes daily.     Objective  Blood pressure 138/88, pulse 71, height 5' 8 (1.727 m), SpO2 98%.   General: No apparent distress alert and oriented x3 mood and affect normal, dressed appropriately.  HEENT: Pupils equal, extraocular movements intact  Respiratory: Patient's speak in full sentences and does not appear short of breath  Cardiovascular: No lower extremity edema, non tender, no erythema  Patient's knees still have some mild arthritic changes but wearing a brace.  Patient does have a thigh compression on as well today.  Negative straight leg test but does have tightness of the hamstring compared to the contralateral side.  Sitting comfortably.  Neurovascularly distally.    Impression and  Recommendations:     The above documentation has been reviewed and is accurate and complete Treanna Dumler M Moorea Boissonneault, DO

## 2023-12-19 ENCOUNTER — Other Ambulatory Visit: Payer: Self-pay

## 2023-12-19 ENCOUNTER — Ambulatory Visit: Admitting: Family Medicine

## 2023-12-19 VITALS — BP 138/88 | HR 71 | Ht 68.0 in

## 2023-12-19 DIAGNOSIS — M25551 Pain in right hip: Secondary | ICD-10-CM | POA: Diagnosis not present

## 2023-12-19 DIAGNOSIS — S76311D Strain of muscle, fascia and tendon of the posterior muscle group at thigh level, right thigh, subsequent encounter: Secondary | ICD-10-CM

## 2023-12-19 DIAGNOSIS — G5701 Lesion of sciatic nerve, right lower limb: Secondary | ICD-10-CM

## 2023-12-19 MED ORDER — GABAPENTIN 300 MG PO CAPS: 300.0000 mg | ORAL_CAPSULE | Freq: Three times a day (TID) | ORAL | 0 refills | Status: AC

## 2023-12-19 NOTE — Patient Instructions (Signed)
 Gabapentin  300mg  3x a day Keep working on core and exercises See me in 3 months

## 2023-12-19 NOTE — Assessment & Plan Note (Signed)
 Does have a score aspect with a piriformis syndrome and is making progress.  Work on continue with the same exercises but I am concerned for some radicular symptoms with patient showing some degenerative disc disease in the lumbar spine.  Discussed icing regimen and home exercises, discussed increasing gabapentin  to 300 mg 3 times a day to see how that responds with patient not having any side effects to the medication as of yet.  Follow-up with me again in 3 months to make sure patient continues to make improvement.

## 2023-12-19 NOTE — Assessment & Plan Note (Signed)
 Still wearing the compression sleeves and doing well.  We discussed the findings on x-ray of the synovial herniated pit.  We discussed seeing worsening pain would consider advanced imaging including MRI of the hip and the lumbar if any worsening discomfort.  As of now we will just increase the gabapentin  to 300 mg and see how patient responds.

## 2023-12-21 ENCOUNTER — Other Ambulatory Visit: Payer: Self-pay | Admitting: Family Medicine

## 2024-01-15 ENCOUNTER — Other Ambulatory Visit: Payer: Self-pay | Admitting: Internal Medicine

## 2024-01-15 ENCOUNTER — Other Ambulatory Visit: Payer: Self-pay

## 2024-02-01 ENCOUNTER — Other Ambulatory Visit: Payer: Self-pay | Admitting: Family Medicine

## 2024-02-18 ENCOUNTER — Other Ambulatory Visit: Payer: Self-pay | Admitting: Internal Medicine

## 2024-03-13 NOTE — Progress Notes (Unsigned)
 " [facility] [address] [place] [address] Phone: [phone] Subjective:    I'm seeing this patient by the request  of:  Norleen Lynwood ORN, MD  CC:   [name]  12/19/2023 Still wearing the compression sleeves and doing well.  We discussed the findings on x-ray of the synovial herniated pit.  We discussed seeing worsening pain would consider advanced imaging including MRI of the hip and the lumbar if any worsening discomfort.  As of now we will just increase the gabapentin  to 300 mg and see how patient responds.     Does have a score aspect with a piriformis syndrome and is making progress.  Work on continue with the same exercises but I am concerned for some radicular symptoms with patient showing some degenerative disc disease in the lumbar spine.  Discussed icing regimen and home exercises, discussed increasing gabapentin  to 300 mg 3 times a day to see how that responds with patient not having any side effects to the medication as of yet.  Follow-up with me again in 3 months to make sure patient continues to make improvement.      Update 03/17/2024 Jason Ortega is a 59 y.o. male coming in with complaint of R hip pain. Patient states       Past Medical History:  Diagnosis Date   Allergy    Anemia    Arthritis    Cataract    bilateral cataracts removed   Depression    Diabetes mellitus    Frequent headaches    GERD (gastroesophageal reflux disease)    Glaucoma    Hypercholesteremia    Hypertension    Migraines    Sleep apnea    wears c-PAP   Past Surgical History:  Procedure Laterality Date   bilateral cataracts removed     CARDIAC CATHETERIZATION  12/2011   COLONOSCOPY     LEFT HEART CATHETERIZATION WITH CORONARY ANGIOGRAM N/A 11/12/2011   Procedure: LEFT HEART CATHETERIZATION WITH CORONARY ANGIOGRAM;  Surgeon: Erick JONELLE Bergamo, MD;  Location: [facility] CATH LAB;  Service: Cardiovascular;  Laterality: N/A;   SHOULDER SURGERY      Social History   Socioeconomic History   Marital status: Single    Spouse name: Not on file   Number of children: 2   Years of education: 22   Highest education level: 12th grade  Occupational History   Occupation: Nature Conservation Officer  Tobacco Use   Smoking status: Never   Smokeless tobacco: Never  Vaping Use   Vaping status: Never Used  Substance and Sexual Activity   Alcohol use: Yes    Comment: occasional   Drug use: No   Sexual activity: Yes    Birth control/protection: None  Other Topics Concern   Not on file  Social History Narrative   Fun/Hobby: Coach football   Social Drivers of Health   Tobacco Use: Low Risk (10/16/2023)   Patient History    Smoking Tobacco Use: Never    Smokeless Tobacco Use: Never    Passive Exposure: Not on file  Financial Resource Strain: Medium Risk (03/18/2023)   Overall Financial Resource Strain (CARDIA)    Difficulty of Paying Living Expenses: Somewhat hard  Food Insecurity: Food Insecurity Present (03/18/2023)   Hunger Vital Sign    Worried About Running Out of Food in the Last Year: Sometimes true    Ran Out of Food in the Last Year: Sometimes true  Transportation Needs: No Transportation Needs (03/18/2023)   PRAPARE -  Administrator, Civil Service (Medical): No    Lack of Transportation (Non-Medical): No  Physical Activity: Sufficiently Active (03/18/2023)   Exercise Vital Sign    Days of Exercise per Week: 6 days    Minutes of Exercise per Session: 60 min  Stress: Stress Concern Present (03/18/2023)   [employer] of Occupational Health - Occupational Stress Questionnaire    Feeling of Stress : To some extent  Social Connections: Moderately Isolated (03/18/2023)   Social Connection and Isolation Panel    Frequency of Communication with Friends and Family: More than three times a week    Frequency of Social Gatherings with Friends and Family: Once a week    Attends Religious Services: More than 4 times per year    Active  Member of Clubs or Organizations: No    Attends Engineer, Structural: Not on file    Marital Status: Divorced  Depression (PHQ2-9): Low Risk (03/18/2023)   Depression (PHQ2-9)    PHQ-2 Score: 0  Alcohol Screen: Low Risk (03/18/2023)   Alcohol Screen    Last Alcohol Screening Score (AUDIT): 1  Housing: Low Risk (03/18/2023)   Housing Stability Vital Sign    Unable to Pay for Housing in the Last Year: No    Number of Times Moved in the Last Year: 0    Homeless in the Last Year: No  Utilities: Not on file  Health Literacy: Not on file   Allergies[1] Family History  Problem Relation Age of Onset   Arthritis Mother    Stroke Mother    Hypertension Father    Diabetes Father    Cancer Father        Multiple Myleoma   Diabetes Maternal Grandfather    Colon cancer Neg Hx    Esophageal cancer Neg Hx    Rectal cancer Neg Hx    Stomach cancer Neg Hx    Sleep apnea Neg Hx     Current Outpatient Medications (Endocrine & Metabolic):    empagliflozin  (JARDIANCE ) 25 MG TABS tablet, Take 1 tablet (25 mg total) by mouth daily before breakfast.   metFORMIN  (GLUCOPHAGE ) 1000 MG tablet, Take 1 tablet (1,000 mg total) by mouth 2 (two) times daily with a meal.   Semaglutide , 2 MG/DOSE, (OZEMPIC , 2 MG/DOSE,) 8 MG/3ML SOPN, Inject 2 mg into the skin once a week.  Current Outpatient Medications (Cardiovascular):    atorvastatin  (LIPITOR) 40 MG tablet, TAKE 1 TABLET BY MOUTH EVERY DAY   losartan  (COZAAR ) 100 MG tablet, TAKE 1 TABLET BY MOUTH EVERY DAY   propranolol  (INDERAL ) 20 MG tablet, Take 1 tablet (20 mg total) by mouth at bedtime.   sildenafil (REVATIO) 20 MG tablet, Take 20 mg by mouth. 3-5 tabs prn   verapamil  (CALAN -SR) 240 MG CR tablet, TAKE 1 TABLET BY MOUTH AT BEDTIME.  Current Outpatient Medications (Respiratory):    fluticasone  (FLONASE ) 50 MCG/ACT nasal spray, Place 2 sprays into both nostrils daily for 14 days.  Current Outpatient Medications (Analgesics):    aspirin  EC  325 MG tablet, Take 1 tablet (325 mg total) by mouth daily.   Galcanezumab -gnlm (EMGALITY ) 120 MG/ML SOAJ, Inject 120 mg into the skin every 30 (thirty) days.   ibuprofen  (ADVIL ) 800 MG tablet, Take 1 tablet (800 mg total) by mouth 3 (three) times daily as needed.   meloxicam  (MOBIC ) 15 MG tablet, TAKE 1 TABLET (15 MG TOTAL) BY MOUTH DAILY.   Rimegepant Sulfate (NURTEC) 75 MG TBDP, Take 1 tablet (75 mg  total) by mouth as needed (Take 1 tablet at onset of migraine ( Do not exceed more than 1 tablet in 24 hours)).   rizatriptan  (MAXALT -MLT) 10 MG disintegrating tablet, TAKE 1 TABLET BY MOUTH AS NEEDED FOR MIGRAINE. MAY REPEAT IN 2 HOURS IF NEEDED  Current Outpatient Medications (Other):    Blood Glucose Monitoring Suppl (ONETOUCH VERIO) w/Device KIT, 1 Device by Does not apply route 2 (two) times daily. E11.9   Blood Pressure Monitoring (BLOOD PRESSURE DIGITAL SOLN) KIT,    chlorproMAZINE  (THORAZINE ) 25 MG tablet, Take 1 tablet (25 mg total) by mouth 3 (three) times daily as needed.   diclofenac  Sodium (VOLTAREN ) 1 % GEL, Apply 2 g topically 4 (four) times daily as needed.   dicyclomine  (BENTYL ) 10 MG capsule, TAKE 1 CAPSULE (10 MG TOTAL) BY MOUTH 4 (FOUR) TIMES DAILY - BEFORE MEALS AND AT BEDTIME.   gabapentin  (NEURONTIN ) 100 MG capsule, TAKE 2 CAPSULES BY MOUTH 2 TIMES DAILY.   gabapentin  (NEURONTIN ) 300 MG capsule, Take 1 capsule (300 mg total) by mouth 3 (three) times daily.   glucose blood (ONETOUCH VERIO) test strip, 1 each by Other route 2 (two) times daily. E11.9   methocarbamol  (ROBAXIN ) 500 MG tablet, TAKE 1 TABLET BY MOUTH TWICE DAILY   OneTouch Delica Lancets 30G MISC, 1 each by Does not apply route 2 (two) times daily. E11.9   pantoprazole  (PROTONIX ) 40 MG tablet, TAKE 1 TABLET BY MOUTH TWICE A DAY   PENNSAID  2 % SOLN, SMARTSIG:2 Pump Topical Twice Daily   timolol (TIMOPTIC-XR) 0.5 % ophthalmic gel-forming, Place 1 drop into both eyes daily.    Reviewed prior external information  including notes and imaging from  primary care provider As well as notes that were available from care everywhere and other healthcare systems.  Past medical history, social, surgical and family history all reviewed in electronic medical record.  No pertanent information unless stated regarding to the chief complaint.   Review of Systems:  No headache, visual changes, nausea, vomiting, diarrhea, constipation, dizziness, abdominal pain, skin rash, fevers, chills, night sweats, weight loss, swollen lymph nodes, body aches, joint swelling, chest pain, shortness of breath, mood changes. POSITIVE muscle aches  Objective  There were no vitals taken for this visit.   General: No apparent distress alert and oriented x3 mood and affect normal, dressed appropriately.  HEENT: Pupils equal, extraocular movements intact  Respiratory: Patient's speak in full sentences and does not appear short of breath  Cardiovascular: No lower extremity edema, non tender, no erythema      Impression and Recommendations:           [1]  Allergies Allergen Reactions   Pollen Extract Other (See Comments)    Stuffy nose   "

## 2024-03-17 ENCOUNTER — Ambulatory Visit: Admitting: Family Medicine

## 2024-03-17 ENCOUNTER — Ambulatory Visit: Admitting: Endocrinology

## 2024-06-02 ENCOUNTER — Ambulatory Visit: Admitting: Family Medicine
